# Patient Record
Sex: Male | Born: 1960 | Race: Black or African American | Hispanic: No | Smoking: Current every day smoker
Health system: Southern US, Community
[De-identification: ages and names within clinical notes are randomized; demographics above are authoritative.]

## PROBLEM LIST (undated history)

## (undated) DIAGNOSIS — E1110 Type 2 diabetes mellitus with ketoacidosis without coma: Secondary | ICD-10-CM

## (undated) DIAGNOSIS — Z9889 Other specified postprocedural states: Secondary | ICD-10-CM

## (undated) DIAGNOSIS — T8859XA Other complications of anesthesia, initial encounter: Secondary | ICD-10-CM

## (undated) DIAGNOSIS — E119 Type 2 diabetes mellitus without complications: Secondary | ICD-10-CM

## (undated) DIAGNOSIS — S68119A Complete traumatic metacarpophalangeal amputation of unspecified finger, initial encounter: Secondary | ICD-10-CM

## (undated) DIAGNOSIS — T4145XA Adverse effect of unspecified anesthetic, initial encounter: Secondary | ICD-10-CM

## (undated) DIAGNOSIS — R112 Nausea with vomiting, unspecified: Secondary | ICD-10-CM

---
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
## 1997-06-17 ENCOUNTER — Emergency Department (HOSPITAL_COMMUNITY): Admission: EM | Admit: 1997-06-17 | Discharge: 1997-06-17 | Payer: Self-pay | Admitting: Emergency Medicine

## 1998-10-12 ENCOUNTER — Emergency Department (HOSPITAL_COMMUNITY): Admission: EM | Admit: 1998-10-12 | Discharge: 1998-10-12 | Payer: Self-pay | Admitting: Internal Medicine

## 1998-11-21 ENCOUNTER — Emergency Department (HOSPITAL_COMMUNITY): Admission: EM | Admit: 1998-11-21 | Discharge: 1998-11-21 | Payer: Self-pay | Admitting: Emergency Medicine

## 1999-10-21 ENCOUNTER — Emergency Department (HOSPITAL_COMMUNITY): Admission: EM | Admit: 1999-10-21 | Discharge: 1999-10-21 | Payer: Self-pay | Admitting: Emergency Medicine

## 2002-02-06 ENCOUNTER — Emergency Department (HOSPITAL_COMMUNITY): Admission: EM | Admit: 2002-02-06 | Discharge: 2002-02-06 | Payer: Self-pay | Admitting: Emergency Medicine

## 2002-10-28 ENCOUNTER — Emergency Department (HOSPITAL_COMMUNITY): Admission: EM | Admit: 2002-10-28 | Discharge: 2002-10-29 | Payer: Self-pay | Admitting: Emergency Medicine

## 2003-05-22 ENCOUNTER — Emergency Department (HOSPITAL_COMMUNITY): Admission: EM | Admit: 2003-05-22 | Discharge: 2003-05-22 | Payer: Self-pay | Admitting: Emergency Medicine

## 2003-06-01 ENCOUNTER — Emergency Department (HOSPITAL_COMMUNITY): Admission: EM | Admit: 2003-06-01 | Discharge: 2003-06-01 | Payer: Self-pay | Admitting: Emergency Medicine

## 2003-10-28 ENCOUNTER — Emergency Department (HOSPITAL_COMMUNITY): Admission: EM | Admit: 2003-10-28 | Discharge: 2003-10-28 | Payer: Self-pay | Admitting: Emergency Medicine

## 2003-11-13 ENCOUNTER — Ambulatory Visit: Payer: Self-pay | Admitting: Internal Medicine

## 2003-12-04 ENCOUNTER — Emergency Department (HOSPITAL_COMMUNITY): Admission: EM | Admit: 2003-12-04 | Discharge: 2003-12-04 | Payer: Self-pay

## 2003-12-13 ENCOUNTER — Emergency Department (HOSPITAL_COMMUNITY): Admission: EM | Admit: 2003-12-13 | Discharge: 2003-12-13 | Payer: Self-pay | Admitting: Emergency Medicine

## 2004-03-17 ENCOUNTER — Emergency Department (HOSPITAL_COMMUNITY): Admission: EM | Admit: 2004-03-17 | Discharge: 2004-03-17 | Payer: Self-pay | Admitting: Emergency Medicine

## 2004-04-24 ENCOUNTER — Emergency Department (HOSPITAL_COMMUNITY): Admission: EM | Admit: 2004-04-24 | Discharge: 2004-04-25 | Payer: Self-pay | Admitting: Emergency Medicine

## 2004-08-14 ENCOUNTER — Emergency Department (HOSPITAL_COMMUNITY): Admission: EM | Admit: 2004-08-14 | Discharge: 2004-08-14 | Payer: Self-pay | Admitting: Emergency Medicine

## 2004-09-17 ENCOUNTER — Ambulatory Visit: Payer: Self-pay | Admitting: Internal Medicine

## 2004-09-23 ENCOUNTER — Ambulatory Visit: Payer: Self-pay | Admitting: Internal Medicine

## 2004-11-01 ENCOUNTER — Emergency Department (HOSPITAL_COMMUNITY): Admission: EM | Admit: 2004-11-01 | Discharge: 2004-11-01 | Payer: Self-pay | Admitting: Emergency Medicine

## 2005-01-07 ENCOUNTER — Emergency Department (HOSPITAL_COMMUNITY): Admission: EM | Admit: 2005-01-07 | Discharge: 2005-01-08 | Payer: Self-pay | Admitting: Emergency Medicine

## 2005-04-11 ENCOUNTER — Emergency Department (HOSPITAL_COMMUNITY): Admission: EM | Admit: 2005-04-11 | Discharge: 2005-04-11 | Payer: Self-pay | Admitting: Family Medicine

## 2005-10-29 ENCOUNTER — Emergency Department (HOSPITAL_COMMUNITY): Admission: EM | Admit: 2005-10-29 | Discharge: 2005-10-29 | Payer: Self-pay | Admitting: Emergency Medicine

## 2005-11-30 DIAGNOSIS — E119 Type 2 diabetes mellitus without complications: Secondary | ICD-10-CM | POA: Insufficient documentation

## 2005-11-30 DIAGNOSIS — F411 Generalized anxiety disorder: Secondary | ICD-10-CM | POA: Insufficient documentation

## 2006-01-19 ENCOUNTER — Encounter (INDEPENDENT_AMBULATORY_CARE_PROVIDER_SITE_OTHER): Payer: Self-pay | Admitting: Internal Medicine

## 2006-01-19 ENCOUNTER — Ambulatory Visit: Payer: Self-pay | Admitting: Internal Medicine

## 2006-01-19 LAB — CONVERTED CEMR LAB
Creatinine, Urine: 188.9 mg/dL
Microalb Creat Ratio: 3.8 mg/g (ref 0.0–30.0)
Microalb, Ur: 0.72 mg/dL (ref 0.00–1.89)

## 2006-01-23 ENCOUNTER — Encounter (INDEPENDENT_AMBULATORY_CARE_PROVIDER_SITE_OTHER): Payer: Self-pay | Admitting: Internal Medicine

## 2006-01-23 ENCOUNTER — Ambulatory Visit: Payer: Self-pay | Admitting: Hospitalist

## 2006-01-23 LAB — CONVERTED CEMR LAB
Albumin: 4.4 g/dL (ref 3.5–5.2)
CO2: 27 meq/L (ref 19–32)
Calcium: 9.5 mg/dL (ref 8.4–10.5)
Cholesterol: 233 mg/dL — ABNORMAL HIGH (ref 0–200)
Potassium: 4.7 meq/L (ref 3.5–5.3)
Total Bilirubin: 0.3 mg/dL (ref 0.3–1.2)
Total CHOL/HDL Ratio: 6.7
VLDL: 45 mg/dL — ABNORMAL HIGH (ref 0–40)

## 2006-05-09 ENCOUNTER — Telehealth: Payer: Self-pay | Admitting: *Deleted

## 2006-07-17 ENCOUNTER — Telehealth (INDEPENDENT_AMBULATORY_CARE_PROVIDER_SITE_OTHER): Payer: Self-pay | Admitting: *Deleted

## 2006-07-19 ENCOUNTER — Encounter (INDEPENDENT_AMBULATORY_CARE_PROVIDER_SITE_OTHER): Payer: Self-pay | Admitting: *Deleted

## 2006-07-19 ENCOUNTER — Ambulatory Visit: Payer: Self-pay | Admitting: Hospitalist

## 2006-07-19 DIAGNOSIS — F489 Nonpsychotic mental disorder, unspecified: Secondary | ICD-10-CM | POA: Insufficient documentation

## 2006-07-19 DIAGNOSIS — F17200 Nicotine dependence, unspecified, uncomplicated: Secondary | ICD-10-CM | POA: Insufficient documentation

## 2006-07-19 LAB — CONVERTED CEMR LAB
ALT: 30 units/L (ref 0–53)
AST: 18 units/L (ref 0–37)
Alkaline Phosphatase: 90 units/L (ref 39–117)
BUN: 19 mg/dL (ref 6–23)
Basophils Absolute: 0 10*3/uL (ref 0.0–0.1)
Blood Glucose, Fingerstick: 214
CO2: 27 meq/L (ref 19–32)
Chlamydia, Swab/Urine, PCR: NEGATIVE
Creatinine, Ser: 0.9 mg/dL (ref 0.40–1.50)
Eosinophils Absolute: 0.1 10*3/uL (ref 0.0–0.7)
GC Probe Amp, Urine: NEGATIVE
HCT: 46.9 % (ref 39.0–52.0)
Hgb A1c MFr Bld: 6.8 %
Lymphocytes Relative: 35 % (ref 12–46)
Lymphs Abs: 3.4 10*3/uL — ABNORMAL HIGH (ref 0.7–3.3)
Monocytes Relative: 9 % (ref 3–11)
Potassium: 4.7 meq/L (ref 3.5–5.3)
RDW: 14.6 % — ABNORMAL HIGH (ref 11.5–14.0)
Total Bilirubin: 0.5 mg/dL (ref 0.3–1.2)

## 2006-11-30 ENCOUNTER — Emergency Department (HOSPITAL_COMMUNITY): Admission: EM | Admit: 2006-11-30 | Discharge: 2006-11-30 | Payer: Self-pay | Admitting: Emergency Medicine

## 2007-11-20 ENCOUNTER — Emergency Department (HOSPITAL_COMMUNITY): Admission: EM | Admit: 2007-11-20 | Discharge: 2007-11-20 | Payer: Self-pay | Admitting: Emergency Medicine

## 2010-10-12 LAB — COMPREHENSIVE METABOLIC PANEL
ALT: 25
AST: 28
Albumin: 3.9
BUN: 21
CO2: 28
Calcium: 9.2
Chloride: 106
Creatinine, Ser: 0.85
Sodium: 140
Total Bilirubin: 0.7
Total Protein: 7

## 2010-10-12 LAB — RAPID URINE DRUG SCREEN, HOSP PERFORMED
Amphetamines: NOT DETECTED
Benzodiazepines: NOT DETECTED
Cocaine: NOT DETECTED

## 2010-10-12 LAB — URINALYSIS, ROUTINE W REFLEX MICROSCOPIC
Bilirubin Urine: NEGATIVE
Glucose, UA: 100 — AB
Hgb urine dipstick: NEGATIVE
Protein, ur: NEGATIVE
Specific Gravity, Urine: 1.029
Urobilinogen, UA: 1
pH: 7

## 2010-10-12 LAB — DIFFERENTIAL
Eosinophils Absolute: 0.1
Eosinophils Relative: 1
Lymphocytes Relative: 29
Lymphs Abs: 3
Monocytes Relative: 11
Neutro Abs: 6

## 2010-10-12 LAB — ETHANOL: Alcohol, Ethyl (B): <5

## 2010-10-12 LAB — CBC
Hemoglobin: 13.9
MCV: 86.2
WBC: 10.4

## 2013-10-25 ENCOUNTER — Inpatient Hospital Stay (HOSPITAL_COMMUNITY)
Admission: EM | Admit: 2013-10-25 | Discharge: 2013-11-02 | DRG: 854 | Disposition: A | Discharge status: Home Health | Payer: Self-pay | Attending: Internal Medicine | Admitting: Internal Medicine

## 2013-10-25 ENCOUNTER — Encounter (HOSPITAL_COMMUNITY): Payer: Self-pay | Admitting: Emergency Medicine

## 2013-10-25 ENCOUNTER — Emergency Department (HOSPITAL_COMMUNITY): Payer: Self-pay

## 2013-10-25 DIAGNOSIS — A419 Sepsis, unspecified organism: Principal | ICD-10-CM

## 2013-10-25 DIAGNOSIS — M869 Osteomyelitis, unspecified: Secondary | ICD-10-CM

## 2013-10-25 DIAGNOSIS — L02512 Cutaneous abscess of left hand: Secondary | ICD-10-CM | POA: Diagnosis present

## 2013-10-25 DIAGNOSIS — E871 Hypo-osmolality and hyponatremia: Secondary | ICD-10-CM

## 2013-10-25 DIAGNOSIS — E870 Hyperosmolality and hypernatremia: Secondary | ICD-10-CM | POA: Diagnosis present

## 2013-10-25 DIAGNOSIS — Z9119 Patient's noncompliance with other medical treatment and regimen: Secondary | ICD-10-CM | POA: Diagnosis present

## 2013-10-25 DIAGNOSIS — R700 Elevated erythrocyte sedimentation rate: Secondary | ICD-10-CM

## 2013-10-25 DIAGNOSIS — I10 Essential (primary) hypertension: Secondary | ICD-10-CM

## 2013-10-25 DIAGNOSIS — F17210 Nicotine dependence, cigarettes, uncomplicated: Secondary | ICD-10-CM | POA: Diagnosis present

## 2013-10-25 DIAGNOSIS — M868X4 Other osteomyelitis, hand: Secondary | ICD-10-CM | POA: Diagnosis present

## 2013-10-25 DIAGNOSIS — N179 Acute kidney failure, unspecified: Secondary | ICD-10-CM

## 2013-10-25 DIAGNOSIS — G629 Polyneuropathy, unspecified: Secondary | ICD-10-CM | POA: Diagnosis present

## 2013-10-25 DIAGNOSIS — IMO0002 Reserved for concepts with insufficient information to code with codable children: Secondary | ICD-10-CM

## 2013-10-25 DIAGNOSIS — L03012 Cellulitis of left finger: Secondary | ICD-10-CM | POA: Diagnosis present

## 2013-10-25 DIAGNOSIS — IMO0001 Reserved for inherently not codable concepts without codable children: Secondary | ICD-10-CM

## 2013-10-25 DIAGNOSIS — Z794 Long term (current) use of insulin: Secondary | ICD-10-CM

## 2013-10-25 DIAGNOSIS — Z91120 Patient's intentional underdosing of medication regimen due to financial hardship: Secondary | ICD-10-CM | POA: Diagnosis present

## 2013-10-25 DIAGNOSIS — N183 Chronic kidney disease, stage 3 (moderate): Secondary | ICD-10-CM | POA: Diagnosis present

## 2013-10-25 DIAGNOSIS — I129 Hypertensive chronic kidney disease with stage 1 through stage 4 chronic kidney disease, or unspecified chronic kidney disease: Secondary | ICD-10-CM | POA: Diagnosis present

## 2013-10-25 DIAGNOSIS — E1065 Type 1 diabetes mellitus with hyperglycemia: Secondary | ICD-10-CM | POA: Diagnosis present

## 2013-10-25 DIAGNOSIS — E119 Type 2 diabetes mellitus without complications: Secondary | ICD-10-CM

## 2013-10-25 DIAGNOSIS — D638 Anemia in other chronic diseases classified elsewhere: Secondary | ICD-10-CM

## 2013-10-25 DIAGNOSIS — G5631 Lesion of radial nerve, right upper limb: Secondary | ICD-10-CM | POA: Diagnosis present

## 2013-10-25 DIAGNOSIS — F17220 Nicotine dependence, chewing tobacco, uncomplicated: Secondary | ICD-10-CM | POA: Diagnosis present

## 2013-10-25 DIAGNOSIS — E108 Type 1 diabetes mellitus with unspecified complications: Secondary | ICD-10-CM

## 2013-10-25 DIAGNOSIS — T383X6A Underdosing of insulin and oral hypoglycemic [antidiabetic] drugs, initial encounter: Secondary | ICD-10-CM | POA: Diagnosis present

## 2013-10-25 HISTORY — DX: Type 2 diabetes mellitus without complications: E11.9

## 2013-10-25 LAB — COMPREHENSIVE METABOLIC PANEL
ALK PHOS: 132 U/L — AB (ref 39–117)
ALT: 17 U/L (ref 0–53)
ANION GAP: 17 — AB (ref 5–15)
AST: 15 U/L (ref 0–37)
Albumin: 3.2 g/dL — ABNORMAL LOW (ref 3.5–5.2)
BUN: 42 mg/dL — ABNORMAL HIGH (ref 6–23)
CO2: 19 meq/L (ref 19–32)
Calcium: 10.1 mg/dL (ref 8.4–10.5)
Chloride: 93 mEq/L — ABNORMAL LOW (ref 96–112)
Creatinine, Ser: 2.42 mg/dL — ABNORMAL HIGH (ref 0.50–1.35)
GFR calc Af Amer: 34 mL/min — ABNORMAL LOW (ref >90)
GFR, EST NON AFRICAN AMERICAN: 29 mL/min — AB (ref >90)
GLUCOSE: 588 mg/dL — AB (ref 70–99)
Potassium: 5.2 mEq/L (ref 3.7–5.3)
Sodium: 129 mEq/L — ABNORMAL LOW (ref 137–147)
TOTAL PROTEIN: 9.3 g/dL — AB (ref 6.0–8.3)
Total Bilirubin: 0.3 mg/dL (ref 0.3–1.2)

## 2013-10-25 LAB — CBG MONITORING, ED: GLUCOSE-CAPILLARY: 529 mg/dL — AB (ref 70–99)

## 2013-10-25 LAB — DIFFERENTIAL
BASOS ABS: 0 10*3/uL (ref 0.0–0.1)
BASOS PCT: 0 % (ref 0–1)
EOS PCT: 0 % (ref 0–5)
Eosinophils Absolute: 0 10*3/uL (ref 0.0–0.7)
LYMPHS PCT: 15 % (ref 12–46)
Lymphs Abs: 2.3 10*3/uL (ref 0.7–4.0)
MONOS PCT: 8 % (ref 3–12)
Monocytes Absolute: 1.2 10*3/uL — ABNORMAL HIGH (ref 0.1–1.0)
NEUTROS ABS: 11.7 10*3/uL — AB (ref 1.7–7.7)
NEUTROS PCT: 77 % (ref 43–77)

## 2013-10-25 LAB — ETHANOL

## 2013-10-25 LAB — I-STAT CG4 LACTIC ACID, ED: Lactic Acid, Venous: 1.36 mmol/L (ref 0.5–2.2)

## 2013-10-25 LAB — PROTIME-INR
INR: 1.11 (ref 0.00–1.49)
PROTHROMBIN TIME: 14.5 s (ref 11.6–15.2)

## 2013-10-25 LAB — I-STAT VENOUS BLOOD GAS, ED
Acid-base deficit: 1 mmol/L (ref 0.0–2.0)
BICARBONATE: 23.8 meq/L (ref 20.0–24.0)
O2 Saturation: 97 %
PCO2 VEN: 37.7 mmHg — AB (ref 45.0–50.0)
PH VEN: 7.407 — AB (ref 7.250–7.300)
Patient temperature: 98.1
TCO2: 25 mmol/L (ref 0–100)
pO2, Ven: 86 mmHg — ABNORMAL HIGH (ref 30.0–45.0)

## 2013-10-25 LAB — CBC
HEMATOCRIT: 32.7 % — AB (ref 39.0–52.0)
Hemoglobin: 11.2 g/dL — ABNORMAL LOW (ref 13.0–17.0)
MCH: 28.1 pg (ref 26.0–34.0)
MCHC: 34.3 g/dL (ref 30.0–36.0)
MCV: 82 fL (ref 78.0–100.0)
PLATELETS: 375 10*3/uL (ref 150–400)
RBC: 3.99 MIL/uL — AB (ref 4.22–5.81)
RDW: 13 % (ref 11.5–15.5)
WBC: 15.2 10*3/uL — ABNORMAL HIGH (ref 4.0–10.5)

## 2013-10-25 LAB — APTT: APTT: 33 s (ref 24–37)

## 2013-10-25 MED ORDER — SODIUM CHLORIDE 0.9 % IV BOLUS (SEPSIS)
1000.0000 mL | Freq: Once | INTRAVENOUS | Status: AC
  Administered 2013-10-25: 1000 mL via INTRAVENOUS

## 2013-10-25 MED ORDER — INSULIN ASPART 100 UNIT/ML ~~LOC~~ SOLN
20.0000 [IU] | Freq: Once | SUBCUTANEOUS | Status: AC
  Administered 2013-10-25: 20 [IU] via SUBCUTANEOUS
  Filled 2013-10-25: qty 1

## 2013-10-25 NOTE — ED Notes (Signed)
Jarrett Soho, PA-C, at the bedside.
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
## 2013-10-25 NOTE — ED Notes (Signed)
Critical Glucose lab of 588 given to [place], [place].
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
## 2013-10-26 ENCOUNTER — Encounter (HOSPITAL_COMMUNITY): Payer: Self-pay

## 2013-10-26 ENCOUNTER — Inpatient Hospital Stay (HOSPITAL_COMMUNITY): Payer: Self-pay | Admitting: Anesthesiology

## 2013-10-26 ENCOUNTER — Encounter (HOSPITAL_COMMUNITY)
Admission: EM | Disposition: A | Discharge status: Home Health | Payer: Self-pay | Source: Home / Self Care | Attending: Internal Medicine

## 2013-10-26 ENCOUNTER — Encounter (HOSPITAL_COMMUNITY): Payer: MEDICAID | Admitting: Anesthesiology

## 2013-10-26 ENCOUNTER — Emergency Department (HOSPITAL_COMMUNITY): Payer: Self-pay

## 2013-10-26 DIAGNOSIS — IMO0002 Reserved for concepts with insufficient information to code with codable children: Secondary | ICD-10-CM | POA: Diagnosis present

## 2013-10-26 DIAGNOSIS — I10 Essential (primary) hypertension: Secondary | ICD-10-CM | POA: Diagnosis present

## 2013-10-26 DIAGNOSIS — M869 Osteomyelitis, unspecified: Secondary | ICD-10-CM | POA: Diagnosis present

## 2013-10-26 DIAGNOSIS — E1065 Type 1 diabetes mellitus with hyperglycemia: Secondary | ICD-10-CM | POA: Diagnosis present

## 2013-10-26 DIAGNOSIS — N179 Acute kidney failure, unspecified: Secondary | ICD-10-CM | POA: Diagnosis present

## 2013-10-26 DIAGNOSIS — A419 Sepsis, unspecified organism: Secondary | ICD-10-CM | POA: Diagnosis present

## 2013-10-26 DIAGNOSIS — D638 Anemia in other chronic diseases classified elsewhere: Secondary | ICD-10-CM | POA: Diagnosis present

## 2013-10-26 DIAGNOSIS — E871 Hypo-osmolality and hyponatremia: Secondary | ICD-10-CM | POA: Diagnosis present

## 2013-10-26 DIAGNOSIS — E108 Type 1 diabetes mellitus with unspecified complications: Secondary | ICD-10-CM

## 2013-10-26 HISTORY — PX: I&D EXTREMITY: SHX5045

## 2013-10-26 LAB — LIPID PANEL
CHOL/HDL RATIO: 4.7 ratio
Cholesterol: 180 mg/dL (ref 0–200)
HDL: 38 mg/dL — ABNORMAL LOW (ref >39)
LDL Cholesterol: 112 mg/dL — ABNORMAL HIGH (ref 0–99)
Triglycerides: 148 mg/dL (ref <150)
VLDL: 30 mg/dL (ref 0–40)

## 2013-10-26 LAB — GLUCOSE, CAPILLARY
GLUCOSE-CAPILLARY: 251 mg/dL — AB (ref 70–99)
Glucose-Capillary: 189 mg/dL — ABNORMAL HIGH (ref 70–99)
Glucose-Capillary: 247 mg/dL — ABNORMAL HIGH (ref 70–99)
Glucose-Capillary: 290 mg/dL — ABNORMAL HIGH (ref 70–99)
Glucose-Capillary: 466 mg/dL — ABNORMAL HIGH (ref 70–99)

## 2013-10-26 LAB — BASIC METABOLIC PANEL
Anion gap: 15 (ref 5–15)
BUN: 35 mg/dL — ABNORMAL HIGH (ref 6–23)
CALCIUM: 9.6 mg/dL (ref 8.4–10.5)
CO2: 20 mEq/L (ref 19–32)
Chloride: 107 mEq/L (ref 96–112)
Creatinine, Ser: 2.1 mg/dL — ABNORMAL HIGH (ref 0.50–1.35)
GFR calc Af Amer: 40 mL/min — ABNORMAL LOW (ref >90)
GFR, EST NON AFRICAN AMERICAN: 35 mL/min — AB (ref >90)
Glucose, Bld: 174 mg/dL — ABNORMAL HIGH (ref 70–99)
Potassium: 4.2 mEq/L (ref 3.7–5.3)
SODIUM: 142 meq/L (ref 137–147)

## 2013-10-26 LAB — SEDIMENTATION RATE: Sed Rate: 125 mm/hr — ABNORMAL HIGH (ref 0–16)

## 2013-10-26 LAB — RAPID URINE DRUG SCREEN, HOSP PERFORMED
Amphetamines: NOT DETECTED
Barbiturates: NOT DETECTED
Benzodiazepines: NOT DETECTED
Cocaine: NOT DETECTED
OPIATES: NOT DETECTED
Tetrahydrocannabinol: NOT DETECTED

## 2013-10-26 LAB — URINALYSIS, ROUTINE W REFLEX MICROSCOPIC
Bilirubin Urine: NEGATIVE
KETONES UR: NEGATIVE mg/dL
Leukocytes, UA: NEGATIVE
Nitrite: NEGATIVE
PH: 5.5 (ref 5.0–8.0)
PROTEIN: NEGATIVE mg/dL
Specific Gravity, Urine: 1.016 (ref 1.005–1.030)
UROBILINOGEN UA: 0.2 mg/dL (ref 0.0–1.0)

## 2013-10-26 LAB — CBC
HCT: 30 % — ABNORMAL LOW (ref 39.0–52.0)
Hemoglobin: 10.3 g/dL — ABNORMAL LOW (ref 13.0–17.0)
MCH: 28 pg (ref 26.0–34.0)
MCHC: 34.3 g/dL (ref 30.0–36.0)
MCV: 81.5 fL (ref 78.0–100.0)
PLATELETS: 368 10*3/uL (ref 150–400)
RBC: 3.68 MIL/uL — ABNORMAL LOW (ref 4.22–5.81)
RDW: 13 % (ref 11.5–15.5)
WBC: 14.8 10*3/uL — ABNORMAL HIGH (ref 4.0–10.5)

## 2013-10-26 LAB — SURGICAL PCR SCREEN
MRSA, PCR: NEGATIVE
Staphylococcus aureus: NEGATIVE

## 2013-10-26 LAB — URINE MICROSCOPIC-ADD ON

## 2013-10-26 LAB — TSH: TSH: 0.344 u[IU]/mL — ABNORMAL LOW (ref 0.350–4.500)

## 2013-10-26 SURGERY — IRRIGATION AND DEBRIDEMENT EXTREMITY
Anesthesia: General | Laterality: Left

## 2013-10-26 MED ORDER — ONDANSETRON HCL 4 MG/2ML IJ SOLN
INTRAMUSCULAR | Status: DC | PRN
  Administered 2013-10-26: 4 mg via INTRAVENOUS

## 2013-10-26 MED ORDER — SODIUM CHLORIDE 0.9 % IJ SOLN
INTRAMUSCULAR | Status: AC
  Filled 2013-10-26: qty 10

## 2013-10-26 MED ORDER — VANCOMYCIN HCL IN DEXTROSE 1-5 GM/200ML-% IV SOLN
1000.0000 mg | Freq: Once | INTRAVENOUS | Status: AC
  Administered 2013-10-26: 1000 mg via INTRAVENOUS
  Filled 2013-10-26: qty 200

## 2013-10-26 MED ORDER — LIDOCAINE HCL (CARDIAC) 20 MG/ML IV SOLN
INTRAVENOUS | Status: DC | PRN
  Administered 2013-10-26: 100 mg via INTRAVENOUS

## 2013-10-26 MED ORDER — SODIUM CHLORIDE 0.9 % IV SOLN
INTRAVENOUS | Status: DC
  Administered 2013-10-26 (×2): via INTRAVENOUS

## 2013-10-26 MED ORDER — HYDROCODONE-ACETAMINOPHEN 5-325 MG PO TABS
1.0000 | ORAL_TABLET | ORAL | Status: DC | PRN
  Administered 2013-10-26: 2 via ORAL

## 2013-10-26 MED ORDER — PIPERACILLIN-TAZOBACTAM 3.375 G IVPB
3.3750 g | Freq: Three times a day (TID) | INTRAVENOUS | Status: DC
  Administered 2013-10-26 – 2013-10-28 (×7): 3.375 g via INTRAVENOUS
  Filled 2013-10-26 (×9): qty 50

## 2013-10-26 MED ORDER — FENTANYL CITRATE 0.05 MG/ML IJ SOLN
INTRAMUSCULAR | Status: AC
Start: 2013-10-26 — End: 2013-10-26
  Filled 2013-10-26: qty 5

## 2013-10-26 MED ORDER — DEXTROSE 5 % IV SOLN
INTRAVENOUS | Status: DC | PRN
  Administered 2013-10-26: 16:00:00 via INTRAVENOUS

## 2013-10-26 MED ORDER — INSULIN GLARGINE 100 UNIT/ML ~~LOC~~ SOLN
10.0000 [IU] | Freq: Every day | SUBCUTANEOUS | Status: DC
  Administered 2013-10-26 (×2): 10 [IU] via SUBCUTANEOUS
  Filled 2013-10-26 (×4): qty 0.1

## 2013-10-26 MED ORDER — FENTANYL CITRATE 0.05 MG/ML IJ SOLN
INTRAMUSCULAR | Status: DC | PRN
  Administered 2013-10-26: 100 ug via INTRAVENOUS
  Administered 2013-10-26: 50 ug via INTRAVENOUS
  Administered 2013-10-26: 100 ug via INTRAVENOUS

## 2013-10-26 MED ORDER — SODIUM CHLORIDE 0.9 % IV SOLN
INTRAVENOUS | Status: DC
Start: 2013-10-26 — End: 2013-10-26
  Administered 2013-10-26: 02:00:00 via INTRAVENOUS

## 2013-10-26 MED ORDER — SODIUM CHLORIDE 0.9 % IR SOLN
Status: DC | PRN
  Administered 2013-10-26: 1000 mL

## 2013-10-26 MED ORDER — DEXAMETHASONE SODIUM PHOSPHATE 4 MG/ML IJ SOLN
INTRAMUSCULAR | Status: DC | PRN
  Administered 2013-10-26: 8 mg via INTRAVENOUS

## 2013-10-26 MED ORDER — INSULIN ASPART 100 UNIT/ML ~~LOC~~ SOLN
0.0000 [IU] | Freq: Every day | SUBCUTANEOUS | Status: DC
  Administered 2013-10-27: 4 [IU] via SUBCUTANEOUS
  Administered 2013-10-28: 3 [IU] via SUBCUTANEOUS

## 2013-10-26 MED ORDER — EPHEDRINE SULFATE 50 MG/ML IJ SOLN
INTRAMUSCULAR | Status: AC
  Filled 2013-10-26: qty 1

## 2013-10-26 MED ORDER — ONDANSETRON HCL 4 MG PO TABS: 4.0000 mg | ORAL_TABLET | Freq: Four times a day (QID) | ORAL | Status: DC | PRN

## 2013-10-26 MED ORDER — LIDOCAINE HCL (CARDIAC) 20 MG/ML IV SOLN
INTRAVENOUS | Status: AC
  Filled 2013-10-26: qty 10

## 2013-10-26 MED ORDER — PROPOFOL 10 MG/ML IV BOLUS
INTRAVENOUS | Status: DC | PRN
  Administered 2013-10-26: 150 mg via INTRAVENOUS

## 2013-10-26 MED ORDER — HYDRALAZINE HCL 20 MG/ML IJ SOLN
5.0000 mg | Freq: Four times a day (QID) | INTRAMUSCULAR | Status: DC | PRN
  Administered 2013-10-28: 5 mg via INTRAVENOUS
  Filled 2013-10-26: qty 1

## 2013-10-26 MED ORDER — HYDROCODONE-ACETAMINOPHEN 5-325 MG PO TABS
ORAL_TABLET | ORAL | Status: AC
  Administered 2013-10-26: 2 via ORAL
  Filled 2013-10-26: qty 2

## 2013-10-26 MED ORDER — HYDROMORPHONE HCL 1 MG/ML IJ SOLN
0.2500 mg | INTRAMUSCULAR | Status: DC | PRN
  Administered 2013-10-26 (×4): 0.5 mg via INTRAVENOUS

## 2013-10-26 MED ORDER — ENOXAPARIN SODIUM 40 MG/0.4ML ~~LOC~~ SOLN
40.0000 mg | Freq: Every day | SUBCUTANEOUS | Status: DC
  Administered 2013-10-27 – 2013-11-02 (×6): 40 mg via SUBCUTANEOUS
  Filled 2013-10-26 (×8): qty 0.4

## 2013-10-26 MED ORDER — INSULIN ASPART 100 UNIT/ML ~~LOC~~ SOLN
0.0000 [IU] | Freq: Three times a day (TID) | SUBCUTANEOUS | Status: DC
  Administered 2013-10-26: 3 [IU] via SUBCUTANEOUS
  Administered 2013-10-26: 1 [IU] via SUBCUTANEOUS
  Administered 2013-10-27 – 2013-10-28 (×4): 9 [IU] via SUBCUTANEOUS
  Administered 2013-10-28: 7 [IU] via SUBCUTANEOUS
  Administered 2013-10-28: 5 [IU] via SUBCUTANEOUS
  Administered 2013-10-29: 1 [IU] via SUBCUTANEOUS

## 2013-10-26 MED ORDER — HYDROMORPHONE HCL 1 MG/ML IJ SOLN
INTRAMUSCULAR | Status: AC
  Administered 2013-10-26: 0.5 mg via INTRAVENOUS
  Filled 2013-10-26: qty 1

## 2013-10-26 MED ORDER — BUPIVACAINE HCL (PF) 0.25 % IJ SOLN
INTRAMUSCULAR | Status: AC
  Filled 2013-10-26: qty 30

## 2013-10-26 MED ORDER — HYDROMORPHONE HCL 1 MG/ML IJ SOLN: 1.0000 mg | INTRAMUSCULAR | Status: DC | PRN

## 2013-10-26 MED ORDER — VANCOMYCIN HCL IN DEXTROSE 750-5 MG/150ML-% IV SOLN
750.0000 mg | Freq: Two times a day (BID) | INTRAVENOUS | Status: DC
  Administered 2013-10-26 – 2013-10-28 (×4): 750 mg via INTRAVENOUS
  Filled 2013-10-26 (×5): qty 150

## 2013-10-26 MED ORDER — INSULIN ASPART 100 UNIT/ML ~~LOC~~ SOLN
8.0000 [IU] | Freq: Once | SUBCUTANEOUS | Status: AC
  Administered 2013-10-26: 8 [IU] via SUBCUTANEOUS

## 2013-10-26 MED ORDER — ONDANSETRON HCL 4 MG/2ML IJ SOLN: 4.0000 mg | Freq: Four times a day (QID) | INTRAMUSCULAR | Status: DC | PRN

## 2013-10-26 MED ORDER — PROPOFOL 10 MG/ML IV BOLUS
INTRAVENOUS | Status: AC
  Filled 2013-10-26: qty 20

## 2013-10-26 MED ORDER — BUPIVACAINE HCL (PF) 0.25 % IJ SOLN
INTRAMUSCULAR | Status: DC | PRN
  Administered 2013-10-26: 10 mL

## 2013-10-26 MED ORDER — ONDANSETRON HCL 4 MG/2ML IJ SOLN: 4.0000 mg | Freq: Once | INTRAMUSCULAR | Status: DC | PRN

## 2013-10-26 MED ORDER — SODIUM CHLORIDE 0.9 % IV SOLN
INTRAVENOUS | Status: DC
  Administered 2013-10-26: 15:00:00 via INTRAVENOUS

## 2013-10-26 MED ORDER — ONDANSETRON HCL 4 MG/2ML IJ SOLN: 4.0000 mg | Freq: Three times a day (TID) | INTRAMUSCULAR | Status: DC | PRN

## 2013-10-26 MED ORDER — SODIUM CHLORIDE 0.9 % IJ SOLN
3.0000 mL | Freq: Two times a day (BID) | INTRAMUSCULAR | Status: DC
  Administered 2013-10-26 – 2013-11-02 (×12): 3 mL via INTRAVENOUS

## 2013-10-26 SURGICAL SUPPLY — 45 items
BAG DECANTER FOR FLEXI CONT (MISCELLANEOUS) ×3 IMPLANT
BANDAGE ELASTIC 3 VELCRO ST LF (GAUZE/BANDAGES/DRESSINGS) IMPLANT
BANDAGE ELASTIC 4 VELCRO ST LF (GAUZE/BANDAGES/DRESSINGS) ×3 IMPLANT
BANDAGE GAUZE 4  KLING STR (GAUZE/BANDAGES/DRESSINGS) ×3 IMPLANT
BNDG ELASTIC 2 VLCR STRL LF (GAUZE/BANDAGES/DRESSINGS) ×3 IMPLANT
BNDG ESMARK 4X9 LF (GAUZE/BANDAGES/DRESSINGS) ×3 IMPLANT
BNDG GAUZE ELAST 4 BULKY (GAUZE/BANDAGES/DRESSINGS) ×3 IMPLANT
CHLORAPREP W/TINT 26ML (MISCELLANEOUS) ×3 IMPLANT
CORDS BIPOLAR (ELECTRODE) ×3 IMPLANT
CUFF TOURNIQUET SINGLE 18IN (TOURNIQUET CUFF) IMPLANT
DRAPE SURG 17X23 STRL (DRAPES) ×3 IMPLANT
DRSG ADAPTIC 3X8 NADH LF (GAUZE/BANDAGES/DRESSINGS) ×3 IMPLANT
ELECT REM PT RETURN 9FT ADLT (ELECTROSURGICAL)
ELECTRODE REM PT RTRN 9FT ADLT (ELECTROSURGICAL) IMPLANT
GAUZE PACKING IODOFORM 1/4X15 (GAUZE/BANDAGES/DRESSINGS) ×3 IMPLANT
GAUZE PACKING IODOFORM 1/4X5 (PACKING) IMPLANT
GAUZE SPONGE 4X4 12PLY STRL (GAUZE/BANDAGES/DRESSINGS) ×3 IMPLANT
GAUZE XEROFORM 1X8 LF (GAUZE/BANDAGES/DRESSINGS) ×3 IMPLANT
GLOVE BIOGEL M STRL SZ7.5 (GLOVE) ×3 IMPLANT
GOWN STRL REUS W/ TWL LRG LVL3 (GOWN DISPOSABLE) ×2 IMPLANT
GOWN STRL REUS W/ TWL XL LVL3 (GOWN DISPOSABLE) ×1 IMPLANT
GOWN STRL REUS W/TWL LRG LVL3 (GOWN DISPOSABLE) ×4
GOWN STRL REUS W/TWL XL LVL3 (GOWN DISPOSABLE) ×2
HANDPIECE INTERPULSE COAX TIP (DISPOSABLE)
KIT BASIN OR (CUSTOM PROCEDURE TRAY) ×3 IMPLANT
KIT ROOM TURNOVER OR (KITS) ×3 IMPLANT
MANIFOLD NEPTUNE II (INSTRUMENTS) ×3 IMPLANT
NEEDLE HYPO 25GX1X1/2 BEV (NEEDLE) IMPLANT
NS IRRIG 1000ML POUR BTL (IV SOLUTION) ×3 IMPLANT
PACK ORTHO EXTREMITY (CUSTOM PROCEDURE TRAY) ×3 IMPLANT
PAD ARMBOARD 7.5X6 YLW CONV (MISCELLANEOUS) ×6 IMPLANT
PAD CAST 4YDX4 CTTN HI CHSV (CAST SUPPLIES) ×1 IMPLANT
PADDING CAST COTTON 4X4 STRL (CAST SUPPLIES) ×2
SET HNDPC FAN SPRY TIP SCT (DISPOSABLE) IMPLANT
SOAP 2 % CHG 4 OZ (WOUND CARE) ×3 IMPLANT
SPONGE LAP 18X18 X RAY DECT (DISPOSABLE) ×3 IMPLANT
SPONGE LAP 4X18 X RAY DECT (DISPOSABLE) ×3 IMPLANT
SYR CONTROL 10ML LL (SYRINGE) IMPLANT
TOWEL OR 17X24 6PK STRL BLUE (TOWEL DISPOSABLE) ×3 IMPLANT
TOWEL OR 17X26 10 PK STRL BLUE (TOWEL DISPOSABLE) ×3 IMPLANT
TUBE ANAEROBIC SPECIMEN COL (MISCELLANEOUS) IMPLANT
TUBE CONNECTING 12'X1/4 (SUCTIONS) ×1
TUBE CONNECTING 12X1/4 (SUCTIONS) ×2 IMPLANT
WATER STERILE IRR 1000ML POUR (IV SOLUTION) ×3 IMPLANT
YANKAUER SUCT BULB TIP NO VENT (SUCTIONS) ×3 IMPLANT

## 2013-10-26 NOTE — Progress Notes (Signed)
ANTIBIOTIC CONSULT NOTE - INITIAL  Pharmacy Consult for Vancomycin  Indication: Osteomyelitis  Patient Measurements: Height: 6' (182.9 cm) Weight: 222 lb (100.699 kg) IBW/kg (Calculated) : 77.6  Vital Signs: Temp: 99.1 F (37.3 C) (10/16 2233) Temp Source: Oral (10/16 2233) BP: 148/88 mmHg (10/17 0145) Pulse Rate: 113 (10/17 0145)  Labs:  Recent Labs  10/25/13 2250  WBC 15.2*  HGB 11.2*  PLT 375  CREATININE 2.42*   Estimated Creatinine Clearance: 43.8 ml/min (by C-G formula based on Cr of 2.42).  Medical History: Past Medical History  Diagnosis Date  . Diabetes mellitus without complication     Assessment: 53 y/o M with left index finger osteomyelitis, WBC elevated, noted renal dysfunction.   Goal of Therapy:  Vancomycin trough level 15-20 mcg/ml  Plan:  -Vancomycin 1000 mg IV x 1, then 750 mg IV q12h -Trend WBC, temp, renal function  -Drug levels as indicated   Narda Bonds 10/26/2013,1:56 AM
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
## 2013-10-26 NOTE — Op Note (Signed)
NAME:  Timothy Madden, Timothy Madden             ACCOUNT NO.:  [id number]  MEDICAL RECORD NO.:  [id number]  LOCATION:  [id number]                        FACILITY:  [facility]  PHYSICIAN:  Dennie Bible, MD    DATE OF BIRTH:  [birth year]  DATE OF PROCEDURE:  10/26/2013 DATE OF DISCHARGE:                              OPERATIVE REPORT   PREOPERATIVE DIAGNOSIS:  Severe infection of the left index finger with x-ray evidence of osteomyelitis of the distal phalanx.  POSTOPERATIVE DIAGNOSIS:  Severe infection of the left index finger with x-ray evidence of osteomyelitis of the distal phalanx.  PROCEDURE: 1. Incision and drainage of the left index finger. 2. Drainage of the flexor tendon sheath of the left index finger. 3. Amputation of the left index finger just proximal to the PIP joint. 4. Release of the A1 pulley of the left index finger.  ANESTHESIA:  General.  ESTIMATED BLOOD LOSS:  Minimal.  No acute complications.  Cultures were taken.  INDICATIONS:  [name] is a diabetic male who presented to the emergency room last night with a very swollen painful infected left index finger.  I was consulted.  His blood sugar was out of control and showed signs of sepsis.  He was admitted to the hospital for evaluation this morning and felt he needed an I and D with probable amputation of stump or the entire left index finger.  This was discussed with the patient in detail.  DESCRIPTION OF PROCEDURE:  The patient was taken to the operating room, placed supine on the operating room table.  A time-out was performed. The left upper extremity was prepped and draped in normal sterile fashion after general anesthesia was administered.  The finger was approximately 3 times the size of the opposite index finger.  There was obvious infection mostly on the radial aspect of the digit.  initially with a scalpel radial midlateral incision was made, essentially the entire length of the finger.  Once this performed  under pressure, pus was evacuated. The skin was essentially lifted off the underlying dermis.  The  skin was debrided to reveal a red angry infected digit distally.  From the distal phalanx, the finger was nonviable, it was white in appearance, and there was pus present.  Upon further evaluation, there was a hole in the volar aspect overlying the middle phalanx.  This was probed and again pus was emanating from the flexor tendon sheath.  The flexor tendon sheath was opened and revealed pus even back proximally.  Incision was made overlying the A1 pulley of the left index finger.  The pulley was incised. Thorough irrigation of the flexor tendon sheath distally was then performed.  There did not appear to be any proximal extension of pus into the palm of the hand.  Because of the nonviable nature of the skin and essentially the entire finger from the PIP joint distally, it was felt that this could continue to be a source for sepsis for the patient and therefore an amputation at this level was felt in his best interest.  The soft tissues were retracted proximally and the bone was cut just proximal to the PIP joint.  Further debridement of the skin, subcutaneous  tissue, and bone were then performed.  The flexor tendons were grasped and pulled and cut and allowed to retract proximally.  Following this, thorough irrigation of both the A1 pulley incision and the distal stump were then performed. Hemostasis was controlled with direct pressure after iodoform gauze was placed in the wound and a big bulky dressing was placed.  A 10 mL of 0.5% plain Marcaine were infiltrated around the wound for postoperative pain control.  The patient tolerated procedure well was taken recovery room in stable condition.     Dennie Bible, MD     HCC/MEDQ  D:  10/26/2013  T:  10/26/2013  Job:  [id number]

## 2013-10-26 NOTE — Significant Event (Signed)
Patient in surgery.transfered via bed ,vss.

## 2013-10-26 NOTE — Significant Event (Signed)
Patient returned from surgery via bed. LT hand ace warp bandage intact and dry ,elevated on pillows, circulation checks WNL. VSS A&OX4 no c/o pain at this time, diet tray requested.

## 2013-10-26 NOTE — ED Provider Notes (Signed)
Medical screening examination/treatment/procedure(s) were performed by non-physician practitioner and as supervising physician I was immediately available for consultation/collaboration.  Orpah Greek, MD 10/26/13 [phone]

## 2013-10-26 NOTE — Transfer of Care (Signed)
Immediate Anesthesia Transfer of Care Note  Patient: Timothy Madden  Procedure(s) Performed: Procedure(s): IRRIGATION AND DEBRIDEMENT EXTREMITY,PARTIAL AMPUTATION LEFT INDEX FINGER (Left)  Patient Location: PACU  Anesthesia Type:General  Level of Consciousness: awake, oriented, patient cooperative and responds to stimulation  Airway & Oxygen Therapy: Patient Spontanous Breathing and Patient connected to nasal cannula oxygen  Post-op Assessment: Report given to PACU RN, Post -op Vital signs reviewed and stable, Patient moving all extremities and Patient moving all extremities X 4  Post vital signs: Reviewed and stable  Complications: No apparent anesthesia complications

## 2013-10-26 NOTE — ED Notes (Signed)
Attempted to call report

## 2013-10-26 NOTE — H&P (Signed)
[facility] History and Physical  Timothy Madden [id number] DOB: 15-Aug-1960 DOA: 10/25/2013  Referring physician: ED physician PCP: No primary provider on file.   Chief Complaint: finger pain  HPI:  53 y.o. male with a hx of IDDM (noncomplaint due to financial difficulty) presented to the Emergency Department with main concern of several days duration of left pointer finger pain after burning it with hot plastic. He reports the finger has become more swollen and painful for the last several days and was using neosporin and pain medication but this has not offered any relief. He also reports subjective fevers, chills, feels like his sugar levels are high but is not sure as he has not been checking his CBG's and was not taking any medications over 2 months. Pt denies CP, SOB, abd pain. Pt also c/o gradual, persistent, 2 weeks duration of progressively worsening right arm weakness and cannot lift anything with his hand/arm. No aggravating or alleviating factors. Pt denies other focal neurological symptoms.   In ED, pt stable, mild tachy noted, XRAY of the left index finger suggestive of osteo and cellulitis, Hand surgeon called for assistance and will see in AM. TRH to admit. Keep NPO.  Assessment and Plan: Active Problems:   Osteomyelitis of finger of left hand - place on vanc and zosyn - provide IVF, analgesia, antiemetics as needed - appreciate surgery input   Sepsis secondary to osteo and cellulitis - pt meets criteria with fever, leukocytosis, tachycardia - broad spectrum ABX as pt is diabetic - IVF and supportive care   Acute renal failure - secondary to pre renal etiology - provide IVF and repeat BMP in AM   Hyponatremia - also pre renal, IVF as noted above should be adequate for management    Right arm weakness - unclear etiology, ? Radial nerve palsy - OT evaluation     DM, uncontrolled with complications of ? Neuropathy, ACD - check A1C, place on Lantus 10 U for  now and add SSI - diabetic educator consulted     Anemia of chronic disease  - no signs of active bleeding - repeat CBC in AM   HTN - place on Hydralazine for now and adjust the regimen as indicated    Radiological Exams on Admission: Ct Head Wo Contrast  10/26/2013   CLINICAL DATA:  Right arm weakness for 2 weeks.  Initial encounter.  EXAM: CT HEAD WITHOUT CONTRAST  TECHNIQUE: Contiguous axial images were obtained from the base of the skull through the vertex without intravenous contrast.  COMPARISON:  None.  FINDINGS: Skull and Sinuses:Negative for fracture or destructive process.  There is mild inflammatory mucosal thickening in the bilateral paranasal sinuses.  Orbits: No acute abnormality.  Brain: No evidence of acute abnormality, such as acute infarction, hemorrhage, hydrocephalus, or mass lesion/mass effect.  IMPRESSION: Negative head CT.   Electronically Signed   By: Jorje Guild M.D.   On: 10/26/2013 00:24   Dg Finger Index Left  10/26/2013   CLINICAL DATA:  Burn injury left index finger 1 week ago. Initial encounter  EXAM: LEFT INDEX FINGER 2+V  COMPARISON:  None.  FINDINGS: Marked soft tissue swelling surrounding the left index finger suggestive of cellulitis. Additionally, there is bony destruction of the left second distal phalanx suggestive of osteomyelitis.  IMPRESSION: Marked soft tissue swelling surrounding the left index finger suggestive of cellulitis. Additionally, there is bony destruction of the left second distal phalanx suggestive of osteomyelitis.   Electronically Signed   By: Richardson Landry  Jeannine Kitten M.D.   On: 10/26/2013 00:42     Code Status: Full Family Communication: Pt at bedside Disposition Plan: Admit for further evaluation     Review of Systems:  Constitutional:Negative for diaphoresis.  HENT: Negative for hearing loss, ear pain, nosebleeds, congestion, sore throat, neck pain, tinnitus and ear discharge.   Eyes: Negative for blurred vision, double vision,  photophobia, pain, discharge and redness.  Respiratory: Negative for cough, hemoptysis, sputum production, shortness of breath, wheezing and stridor.   Cardiovascular: Negative for chest pain, palpitations, orthopnea, claudication and leg swelling.  Gastrointestinal: Negative for nausea, vomiting and abdominal pain. Genitourinary: Negative for  hematuria and flank pain.  Musculoskeletal: Negative for myalgias, back pain, joint pain and falls.  Skin: Negative for itching and rash.  Neurological: Negative for dizziness Endo/Heme/Allergies: Negative for environmental allergies and polydipsia. Does not bruise/bleed easily.  Psychiatric/Behavioral: Negative for suicidal ideas. The patient is not nervous/anxious.      Past Medical History  Diagnosis Date  . Diabetes mellitus without complication     History reviewed. No pertinent past surgical history.  Social History:  reports that he has been smoking.  His smokeless tobacco use includes Chew. He reports that he does not drink alcohol or use illicit drugs.  Not on File  No pertinent family medical history   Prior to Admission medications   Medication Sig Start Date End Date Taking? Authorizing Provider  benztropine (COGENTIN) 2 MG tablet Take 2 mg by mouth daily.   Yes Historical Provider, MD  ibuprofen (ADVIL,MOTRIN) 200 MG tablet Take 400 mg by mouth every 6 (six) hours as needed for moderate pain.   Yes Historical Provider, MD    Physical Exam: Filed Vitals:   10/26/13 0100 10/26/13 0115 10/26/13 0130 10/26/13 0145  BP: 154/107 143/95 131/92 148/88  Pulse: 105 107 108 113  Temp:      TempSrc:      Resp: 17 14 14 18   Height:      Weight:      SpO2: 100% 97% 97% 97%    Physical Exam  Constitutional: Appears well-developed and well-nourished. No distress.  HENT: Normocephalic. External right and left ear normal. Dry Mm Eyes: Conjunctivae and EOM are normal. PERRLA, no scleral icterus.  Neck: Normal ROM. Neck supple. No JVD.  No tracheal deviation. No thyromegaly.  CVS: Regular rhythm, tachy, S1/S2 +, no murmurs, no gallops, no carotid bruit.  Pulmonary: Effort and breath sounds normal, no stridor, rhonchi, wheezes, rales.  Abdominal: Soft. BS +,  no distension, tenderness, rebound or guarding.  Musculoskeletal: Normal range of motion. Left pointer finger with large fluctuant pocket of pus, TTP Lymphadenopathy: No lymphadenopathy noted, cervical, inguinal. Neuro: Alert. Normal reflexes, muscle tone coordination. No cranial nerve deficit. Skin: Skin is warm and dry. No rash noted. Not diaphoretic. No erythema. No pallor.  Psychiatric: Normal mood and affect.   Labs on Admission:  Basic Metabolic Panel:  Recent Labs Lab 10/25/13 2250  NA 129*  K 5.2  CL 93*  CO2 19  GLUCOSE 588*  BUN 42*  CREATININE 2.42*  CALCIUM 10.1   Liver Function Tests:  Recent Labs Lab 10/25/13 2250  AST 15  ALT 17  ALKPHOS 132*  BILITOT 0.3  PROT 9.3*  ALBUMIN 3.2*   CBC:  Recent Labs Lab 10/25/13 2250  WBC 15.2*  NEUTROABS 11.7*  HGB 11.2*  HCT 32.7*  MCV 82.0  PLT 375   CBG:  Recent Labs Lab 10/25/13 2249  GLUCAP 529*  EKG: Normal sinus rhythm, no ST/T wave changes  Faye Ramsay, MD  [facility] Pager [phone]  If 7PM-7AM, please contact night-coverage [email or web address] Password [email or web address] 10/26/2013, 1:59 AM

## 2013-10-26 NOTE — Anesthesia Postprocedure Evaluation (Signed)
  Anesthesia Post-op Note  Patient: Timothy Madden  Procedure(s) Performed: Procedure(s): IRRIGATION AND DEBRIDEMENT EXTREMITY,PARTIAL AMPUTATION LEFT INDEX FINGER (Left)  Patient Location: PACU  Anesthesia Type:General  Level of Consciousness: awake, alert , oriented and patient cooperative  Airway and Oxygen Therapy: Patient Spontanous Breathing  Post-op Pain: mild  Post-op Assessment: Post-op Vital signs reviewed, Patient's Cardiovascular Status Stable, Respiratory Function Stable, Patent Airway, No signs of Nausea or vomiting and Pain level controlled  Post-op Vital Signs: stable  Last Vitals:  Filed Vitals:   10/26/13 2019  BP: 144/93  Pulse: 91  Temp: 36.7 C  Resp: 18    Complications: No apparent anesthesia complications

## 2013-10-26 NOTE — Consult Note (Signed)
Reason for Consult:infected left index finger Referring Physician: ER  CC:My finger hurts  HPI:  Timothy Madden is an 53 y.o. right handed male who presents with severe swelling of left index finger. Pt states finger has become swollen, ?burn for ~2 wks.  Is unable to bend finger.  Has not been compliant with diabetic meds.       Pain is rated at   8 /10 and is described as sharp/dull.  Pain is constant.  Pain is made better by rest/immobilization, worse with motion.   Associated signs/symptoms: bs high, sepsis Previous treatment:    Past Medical History  Diagnosis Date  . Diabetes mellitus without complication     History reviewed. No pertinent past surgical history.  History reviewed. No pertinent family history.  Social History:  reports that he has been smoking.  His smokeless tobacco use includes Chew. He reports that he does not drink alcohol or use illicit drugs.  Allergies: No Known Allergies  Medications: I have reviewed the patient's current medications.  Results for orders placed during the hospital encounter of 10/25/13 (from the past 48 hour(s))  CBG MONITORING, ED     Status: Abnormal   Collection Time    10/25/13 10:49 PM      Result Value Ref Range   Glucose-Capillary 529 (*) 70 - 99 mg/dL   Comment 1 Documented in Chart     Comment 2 Notify RN    ETHANOL     Status: None   Collection Time    10/25/13 10:50 PM      Result Value Ref Range   Alcohol, Ethyl (B) <11  0 - 11 mg/dL   Comment:            LOWEST DETECTABLE LIMIT FOR     SERUM ALCOHOL IS 11 mg/dL     FOR MEDICAL PURPOSES ONLY  PROTIME-INR     Status: None   Collection Time    10/25/13 10:50 PM      Result Value Ref Range   Prothrombin Time 14.5  11.6 - 15.2 seconds   INR 1.11  0.00 - 1.49  APTT     Status: None   Collection Time    10/25/13 10:50 PM      Result Value Ref Range   aPTT 33  24 - 37 seconds  CBC     Status: Abnormal   Collection Time    10/25/13 10:50 PM      Result Value  Ref Range   WBC 15.2 (*) 4.0 - 10.5 K/uL   RBC 3.99 (*) 4.22 - 5.81 MIL/uL   Hemoglobin 11.2 (*) 13.0 - 17.0 g/dL   HCT 32.7 (*) 39.0 - 52.0 %   MCV 82.0  78.0 - 100.0 fL   MCH 28.1  26.0 - 34.0 pg   MCHC 34.3  30.0 - 36.0 g/dL   RDW 13.0  11.5 - 15.5 %   Platelets 375  150 - 400 K/uL  DIFFERENTIAL     Status: Abnormal   Collection Time    10/25/13 10:50 PM      Result Value Ref Range   Neutrophils Relative % 77  43 - 77 %   Lymphocytes Relative 15  12 - 46 %   Monocytes Relative 8  3 - 12 %   Eosinophils Relative 0  0 - 5 %   Basophils Relative 0  0 - 1 %   Neutro Abs 11.7 (*) 1.7 - 7.7 K/uL  Lymphs Abs 2.3  0.7 - 4.0 K/uL   Monocytes Absolute 1.2 (*) 0.1 - 1.0 K/uL   Eosinophils Absolute 0.0  0.0 - 0.7 K/uL   Basophils Absolute 0.0  0.0 - 0.1 K/uL   WBC Morphology MILD LEFT SHIFT (1-5% METAS, OCC MYELO, OCC BANDS)    COMPREHENSIVE METABOLIC PANEL     Status: Abnormal   Collection Time    10/25/13 10:50 PM      Result Value Ref Range   Sodium 129 (*) 137 - 147 mEq/L   Potassium 5.2  3.7 - 5.3 mEq/L   Chloride 93 (*) 96 - 112 mEq/L   CO2 19  19 - 32 mEq/L   Glucose, Bld 588 (*) 70 - 99 mg/dL   Comment: CRITICAL RESULT CALLED TO, READ BACK BY AND VERIFIED WITH:     WATTS J,RN 10/25/13 2331 WAYK   BUN 42 (*) 6 - 23 mg/dL   Creatinine, Ser 2.42 (*) 0.50 - 1.35 mg/dL   Calcium 10.1  8.4 - 10.5 mg/dL   Total Protein 9.3 (*) 6.0 - 8.3 g/dL   Albumin 3.2 (*) 3.5 - 5.2 g/dL   AST 15  0 - 37 U/L   ALT 17  0 - 53 U/L   Alkaline Phosphatase 132 (*) 39 - 117 U/L   Total Bilirubin 0.3  0.3 - 1.2 mg/dL   GFR calc non Af Amer 29 (*) >90 mL/min   GFR calc Af Amer 34 (*) >90 mL/min   Comment: (NOTE)     The eGFR has been calculated using the CKD EPI equation.     This calculation has not been validated in all clinical situations.     eGFR's persistently <90 mL/min signify possible Chronic Kidney     Disease.   Anion gap 17 (*) 5 - 15  SEDIMENTATION RATE     Status: Abnormal    Collection Time    10/25/13 10:50 PM      Result Value Ref Range   Sed Rate 125 (*) 0 - 16 mm/hr  I-STAT CG4 LACTIC ACID, ED     Status: None   Collection Time    10/25/13 10:59 PM      Result Value Ref Range   Lactic Acid, Venous 1.36  0.5 - 2.2 mmol/L  I-STAT VENOUS BLOOD GAS, ED     Status: Abnormal   Collection Time    10/25/13 11:05 PM      Result Value Ref Range   pH, Ven 7.407 (*) 7.250 - 7.300   pCO2, Ven 37.7 (*) 45.0 - 50.0 mmHg   pO2, Ven 86.0 (*) 30.0 - 45.0 mmHg   Bicarbonate 23.8  20.0 - 24.0 mEq/L   TCO2 25  0 - 100 mmol/L   O2 Saturation 97.0     Acid-base deficit 1.0  0.0 - 2.0 mmol/L   Patient temperature 98.1 F     Sample type VENOUS    URINE RAPID DRUG SCREEN (HOSP PERFORMED)     Status: None   Collection Time    10/26/13 12:34 AM      Result Value Ref Range   Opiates NONE DETECTED  NONE DETECTED   Cocaine NONE DETECTED  NONE DETECTED   Benzodiazepines NONE DETECTED  NONE DETECTED   Amphetamines NONE DETECTED  NONE DETECTED   Tetrahydrocannabinol NONE DETECTED  NONE DETECTED   Barbiturates NONE DETECTED  NONE DETECTED   Comment:            DRUG  SCREEN FOR MEDICAL PURPOSES     ONLY.  IF CONFIRMATION IS NEEDED     FOR ANY PURPOSE, NOTIFY LAB     WITHIN 5 DAYS.                LOWEST DETECTABLE LIMITS     FOR URINE DRUG SCREEN     Drug Class       Cutoff (ng/mL)     Amphetamine      1000     Barbiturate      200     Benzodiazepine   811     Tricyclics       031     Opiates          300     Cocaine          300     THC              50  URINALYSIS, ROUTINE W REFLEX MICROSCOPIC     Status: Abnormal   Collection Time    10/26/13 12:34 AM      Result Value Ref Range   Color, Urine YELLOW  YELLOW   APPearance CLEAR  CLEAR   Specific Gravity, Urine 1.016  1.005 - 1.030   pH 5.5  5.0 - 8.0   Glucose, UA >1000 (*) NEGATIVE mg/dL   Hgb urine dipstick SMALL (*) NEGATIVE   Bilirubin Urine NEGATIVE  NEGATIVE   Ketones, ur NEGATIVE  NEGATIVE mg/dL    Protein, ur NEGATIVE  NEGATIVE mg/dL   Urobilinogen, UA 0.2  0.0 - 1.0 mg/dL   Nitrite NEGATIVE  NEGATIVE   Leukocytes, UA NEGATIVE  NEGATIVE  URINE MICROSCOPIC-ADD ON     Status: None   Collection Time    10/26/13 12:34 AM      Result Value Ref Range   Squamous Epithelial / LPF RARE  RARE   WBC, UA 0-2  <3 WBC/hpf   RBC / HPF 0-2  <3 RBC/hpf   Bacteria, UA RARE  RARE  GLUCOSE, CAPILLARY     Status: Abnormal   Collection Time    10/26/13  3:54 AM      Result Value Ref Range   Glucose-Capillary 189 (*) 70 - 99 mg/dL  BASIC METABOLIC PANEL     Status: Abnormal   Collection Time    10/26/13  4:18 AM      Result Value Ref Range   Sodium 142  137 - 147 mEq/L   Comment: DELTA CHECK NOTED   Potassium 4.2  3.7 - 5.3 mEq/L   Comment: DELTA CHECK NOTED   Chloride 107  96 - 112 mEq/L   CO2 20  19 - 32 mEq/L   Glucose, Bld 174 (*) 70 - 99 mg/dL   BUN 35 (*) 6 - 23 mg/dL   Creatinine, Ser 2.10 (*) 0.50 - 1.35 mg/dL   Calcium 9.6  8.4 - 10.5 mg/dL   GFR calc non Af Amer 35 (*) >90 mL/min   GFR calc Af Amer 40 (*) >90 mL/min   Comment: (NOTE)     The eGFR has been calculated using the CKD EPI equation.     This calculation has not been validated in all clinical situations.     eGFR's persistently <90 mL/min signify possible Chronic Kidney     Disease.   Anion gap 15  5 - 15  CBC     Status: Abnormal   Collection Time    10/26/13  4:18 AM  Result Value Ref Range   WBC 14.8 (*) 4.0 - 10.5 K/uL   RBC 3.68 (*) 4.22 - 5.81 MIL/uL   Hemoglobin 10.3 (*) 13.0 - 17.0 g/dL   HCT 30.0 (*) 39.0 - 52.0 %   MCV 81.5  78.0 - 100.0 fL   MCH 28.0  26.0 - 34.0 pg   MCHC 34.3  30.0 - 36.0 g/dL   RDW 13.0  11.5 - 15.5 %   Platelets 368  150 - 400 K/uL  TSH     Status: Abnormal   Collection Time    10/26/13  4:18 AM      Result Value Ref Range   TSH 0.344 (*) 0.350 - 4.500 uIU/mL  LIPID PANEL     Status: Abnormal   Collection Time    10/26/13  4:18 AM      Result Value Ref Range    Cholesterol 180  0 - 200 mg/dL   Triglycerides 148  <150 mg/dL   HDL 38 (*) >39 mg/dL   Total CHOL/HDL Ratio 4.7     VLDL 30  0 - 40 mg/dL   LDL Cholesterol 112 (*) 0 - 99 mg/dL   Comment:            Total Cholesterol/HDL:CHD Risk     Coronary Heart Disease Risk Table                         Men   Women      1/2 Average Risk   3.4   3.3      Average Risk       5.0   4.4      2 X Average Risk   9.6   7.1      3 X Average Risk  23.4   11.0                Use the calculated Patient Ratio     above and the CHD Risk Table     to determine the patient's CHD Risk.                ATP III CLASSIFICATION (LDL):      <100     mg/dL   Optimal      100-129  mg/dL   Near or Above                        Optimal      130-159  mg/dL   Borderline      160-189  mg/dL   High      >190     mg/dL   Very High    Ct Head Wo Contrast  10/26/2013   CLINICAL DATA:  Right arm weakness for 2 weeks.  Initial encounter.  EXAM: CT HEAD WITHOUT CONTRAST  TECHNIQUE: Contiguous axial images were obtained from the base of the skull through the vertex without intravenous contrast.  COMPARISON:  None.  FINDINGS: Skull and Sinuses:Negative for fracture or destructive process.  There is mild inflammatory mucosal thickening in the bilateral paranasal sinuses.  Orbits: No acute abnormality.  Brain: No evidence of acute abnormality, such as acute infarction, hemorrhage, hydrocephalus, or mass lesion/mass effect.  IMPRESSION: Negative head CT.   Electronically Signed   By: Jorje Guild M.D.   On: 10/26/2013 00:24   Dg Finger Index Left  10/26/2013   CLINICAL DATA:  Burn injury left index finger 1 week  ago. Initial encounter  EXAM: LEFT INDEX FINGER 2+V  COMPARISON:  None.  FINDINGS: Marked soft tissue swelling surrounding the left index finger suggestive of cellulitis. Additionally, there is bony destruction of the left second distal phalanx suggestive of osteomyelitis.  IMPRESSION: Marked soft tissue swelling surrounding  the left index finger suggestive of cellulitis. Additionally, there is bony destruction of the left second distal phalanx suggestive of osteomyelitis.   Electronically Signed   By: Chauncey Cruel M.D.   On: 10/26/2013 00:42    Pertinent items are noted in HPI. Temp:  [99.1 F (37.3 C)-99.2 F (37.3 C)] 99.2 F (37.3 C) (10/17 0246) Pulse Rate:  [95-118] 100 (10/17 0217) Resp:  [13-21] 18 (10/17 0246) BP: (131-166)/(88-110) 155/93 mmHg (10/17 0246) SpO2:  [95 %-100 %] 99 % (10/17 0246) Weight:  [71.079 kg (156 lb 11.2 oz)-100.699 kg (222 lb)] 71.079 kg (156 lb 11.2 oz) (10/17 0246) General appearance: alert and cooperative Resp: clear to auscultation bilaterally Cardio: regular rate and rhythm Extremities: severe swelling and blister formation of entire left index finger, tip of finger with obvious fluctuance, minimal motion of finger, adjacent fingers not involved   Assessment: Infection of LIF with xray evidence of osteo of distal phalanx Plan: Need I&D, amputation of distal phalanx, cont bs iv abx, will leave finger open for dressing changes before closure when wound looks good, infection improved. I have discussed this treatment plan in detail with patient, including the risks of the recommended treatment or surgery, the benefits and the alternatives.  The patient understands that additional treatment may be necessary.  Millie Shorb CHRISTOPHER 10/26/2013, 8:09 AM

## 2013-10-26 NOTE — ED Provider Notes (Signed)
Patient presented to the ER with complaints. Patient complaining of problems with his right arm. He also reports injury to the left finger when he burned it on a hot pot. This occurred approximately a week ago. The patient has been out of his insulin for more than 2 months, blood sugar has been running high.  Face to face Exam: HEENT - PERRLA Lungs - CTAB Heart - RRR, no M/R/G Abd - S/NT/ND Neuro - alert, oriented x3. Right upper extremity examination reveals normal strength at shoulder and elbow. Patient has no extension at the wrist consistent with peripheral radial nerve palsy. Muscle skeletal - significant swelling, edema left index finger, circumferentially around the entire finger.  Plan: Uncontrolled diabetes, initiate IV fluids and treatment for her elevated blood sugar. Patient's complaints of inability to move her right hand for the last 2 weeks appears to be secondary to peripheral neuropathy, specifically radial nerve palsy. Patient reports burning the left index finger. This does not look like a burn, he appears to have a circumferential abscess of the finger, will require surgical intervention. Patient will require hospitalization for further management of multiple problems including hyperglycemia and left finger infection.   Orpah Greek, MD 10/26/13 0010

## 2013-10-26 NOTE — ED Notes (Signed)
Discussed plan of care with [place], [place]. Patient's CBG after liter and insulin at 1am was 412.  Plan for administering 2nd liter bolus, and plan for admission.

## 2013-10-26 NOTE — ED Notes (Signed)
Transporting patient to new room assignment. 

## 2013-10-26 NOTE — Anesthesia Preprocedure Evaluation (Addendum)
Anesthesia Evaluation  Patient identified by MRN, date of birth, ID band Patient awake    Reviewed: Allergy & Precautions, H&P , NPO status , Patient's Chart, lab work & pertinent test results  Airway       Dental   Pulmonary Current Smoker,          Cardiovascular hypertension,     Neuro/Psych Anxiety    GI/Hepatic   Endo/Other  diabetes, Type 2, Oral Hypoglycemic Agents  Renal/GU Renal disease     Musculoskeletal   Abdominal   Peds  Hematology   Anesthesia Other Findings   Reproductive/Obstetrics                          Anesthesia Physical Anesthesia Plan  ASA: III  Anesthesia Plan: General   Post-op Pain Management:    Induction: Intravenous  Airway Management Planned: LMA  Additional Equipment:   Intra-op Plan:   Post-operative Plan: Extubation in OR  Informed Consent: I have reviewed the patients History and Physical, chart, labs and discussed the procedure including the risks, benefits and alternatives for the proposed anesthesia with the patient or authorized representative who has indicated his/her understanding and acceptance.     Plan Discussed with: CRNA, Anesthesiologist and Surgeon  Anesthesia Plan Comments:         Anesthesia Quick Evaluation
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
## 2013-10-26 NOTE — ED Notes (Signed)
CBG taken = 412

## 2013-10-26 NOTE — ED Notes (Signed)
Attempted call report 

## 2013-10-26 NOTE — Anesthesia Procedure Notes (Signed)
Procedure Name: LMA Insertion Date/Time: 10/26/2013 3:49 PM Performed by: Jacquiline Doe A Pre-anesthesia Checklist: Patient identified, Timeout performed, Emergency Drugs available, Suction available and Patient being monitored Patient Re-evaluated:Patient Re-evaluated prior to inductionOxygen Delivery Method: Circle system utilized Preoxygenation: Pre-oxygenation with 100% oxygen Intubation Type: IV induction Ventilation: Mask ventilation without difficulty LMA: LMA inserted LMA Size: 4.0 Tube type: Oral Number of attempts: 1 Placement Confirmation: breath sounds checked- equal and bilateral and positive ETCO2 Tube secured with: Tape Dental Injury: Teeth and Oropharynx as per pre-operative assessment

## 2013-10-27 DIAGNOSIS — E1069 Type 1 diabetes mellitus with other specified complication: Secondary | ICD-10-CM

## 2013-10-27 LAB — BASIC METABOLIC PANEL
ANION GAP: 15 (ref 5–15)
BUN: 36 mg/dL — ABNORMAL HIGH (ref 6–23)
CALCIUM: 9.5 mg/dL (ref 8.4–10.5)
CO2: 21 mEq/L (ref 19–32)
Chloride: 107 mEq/L (ref 96–112)
Creatinine, Ser: 2.03 mg/dL — ABNORMAL HIGH (ref 0.50–1.35)
GFR calc Af Amer: 42 mL/min — ABNORMAL LOW (ref >90)
GFR calc non Af Amer: 36 mL/min — ABNORMAL LOW (ref >90)
Glucose, Bld: 425 mg/dL — ABNORMAL HIGH (ref 70–99)
Potassium: 5.2 mEq/L (ref 3.7–5.3)
SODIUM: 143 meq/L (ref 137–147)

## 2013-10-27 LAB — GLUCOSE, CAPILLARY
GLUCOSE-CAPILLARY: 385 mg/dL — AB (ref 70–99)
GLUCOSE-CAPILLARY: 429 mg/dL — AB (ref 70–99)
Glucose-Capillary: 411 mg/dL — ABNORMAL HIGH (ref 70–99)
Glucose-Capillary: 386 mg/dL — ABNORMAL HIGH (ref 70–99)
Glucose-Capillary: 333 mg/dL — ABNORMAL HIGH (ref 70–99)

## 2013-10-27 LAB — HEMOGLOBIN A1C: Hgb A1c MFr Bld: >20 % — ABNORMAL HIGH (ref <5.7)

## 2013-10-27 MED ORDER — INSULIN GLARGINE 100 UNIT/ML ~~LOC~~ SOLN
10.0000 [IU] | Freq: Two times a day (BID) | SUBCUTANEOUS | Status: DC
  Filled 2013-10-27 (×2): qty 0.1

## 2013-10-27 MED ORDER — INSULIN GLARGINE 100 UNIT/ML ~~LOC~~ SOLN
20.0000 [IU] | Freq: Two times a day (BID) | SUBCUTANEOUS | Status: DC
  Administered 2013-10-27 (×2): 20 [IU] via SUBCUTANEOUS
  Filled 2013-10-27 (×4): qty 0.2

## 2013-10-27 NOTE — Progress Notes (Signed)
S:  Sitting up in bed eating, overall looks better, mild hand pain.  Explained to pt severity of infection and procedure performed and plan.  O:Blood pressure 148/90, pulse 76, temperature 98.2 F (36.8 C), temperature source Oral, resp. rate 18, height 6\' 3"  (1.905 m), weight 68.584 kg (151 lb 3.2 oz), SpO2 100.00%.  LIF amputation site, o gross purulence, some nice pink tissue!  Packing removed, dressing reapplied  A:severe infection, osteomyelitis LIF s/p i&d, partial LIF amputation   P:start wound care, cont abx, f/u cx's, will need additional surgery when wound clean, infection cleared to closed wound.
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
## 2013-10-27 NOTE — Significant Event (Signed)
Dr Venetia Constable called regarding patients high cbg orders received.

## 2013-10-27 NOTE — Progress Notes (Signed)
Utilization Review Completed.   Alvy Alsop, RN, BSN Nurse Case Manager  

## 2013-10-28 LAB — GLUCOSE, CAPILLARY
GLUCOSE-CAPILLARY: 260 mg/dL — AB (ref 70–99)
GLUCOSE-CAPILLARY: 266 mg/dL — AB (ref 70–99)
Glucose-Capillary: 412 mg/dL — ABNORMAL HIGH (ref 70–99)
Glucose-Capillary: 256 mg/dL — ABNORMAL HIGH (ref 70–99)
Glucose-Capillary: 318 mg/dL — ABNORMAL HIGH (ref 70–99)
Glucose-Capillary: 360 mg/dL — ABNORMAL HIGH (ref 70–99)

## 2013-10-28 LAB — BASIC METABOLIC PANEL
Anion gap: 11 (ref 5–15)
BUN: 27 mg/dL — AB (ref 6–23)
CHLORIDE: 119 meq/L — AB (ref 96–112)
CO2: 20 meq/L (ref 19–32)
Calcium: 7.8 mg/dL — ABNORMAL LOW (ref 8.4–10.5)
Creatinine, Ser: 1.68 mg/dL — ABNORMAL HIGH (ref 0.50–1.35)
GFR calc Af Amer: 52 mL/min — ABNORMAL LOW (ref >90)
GFR calc non Af Amer: 45 mL/min — ABNORMAL LOW (ref >90)
Glucose, Bld: 218 mg/dL — ABNORMAL HIGH (ref 70–99)
Potassium: 3.8 mEq/L (ref 3.7–5.3)
Sodium: 150 mEq/L — ABNORMAL HIGH (ref 137–147)

## 2013-10-28 MED ORDER — INSULIN GLARGINE 100 UNIT/ML ~~LOC~~ SOLN
30.0000 [IU] | Freq: Two times a day (BID) | SUBCUTANEOUS | Status: DC
  Administered 2013-10-28 – 2013-10-31 (×7): 30 [IU] via SUBCUTANEOUS
  Filled 2013-10-28 (×9): qty 0.3

## 2013-10-28 MED ORDER — CIPROFLOXACIN HCL 500 MG PO TABS
500.0000 mg | ORAL_TABLET | Freq: Two times a day (BID) | ORAL | Status: DC
  Administered 2013-10-28 – 2013-11-02 (×11): 500 mg via ORAL
  Filled 2013-10-28 (×14): qty 1

## 2013-10-28 MED ORDER — DOXYCYCLINE HYCLATE 100 MG PO TABS
100.0000 mg | ORAL_TABLET | Freq: Two times a day (BID) | ORAL | Status: DC
  Administered 2013-10-28 – 2013-11-02 (×11): 100 mg via ORAL
  Filled 2013-10-28 (×12): qty 1

## 2013-10-28 MED ORDER — HYDROCODONE-ACETAMINOPHEN 5-325 MG PO TABS
1.0000 | ORAL_TABLET | Freq: Four times a day (QID) | ORAL | Status: DC | PRN
  Administered 2013-10-28: 1 via ORAL
  Filled 2013-10-28 (×2): qty 1

## 2013-10-28 NOTE — Progress Notes (Signed)
Report Given to receiving RN. Patient in bed resting. No verbal complaints.

## 2013-10-28 NOTE — Progress Notes (Addendum)
[facility] PROGRESS NOTE  Assessment/Plan: Sepsis due Osteomyelitis of finger of left hand - consulted hand surgeon, S/p I&D, amputation of distal phalanx on 10.18.2015, - provided IV analgesic, started on IV Vanc and zosyn.de-escalate to doxy and cipro. - has remained afebrile. Monitor overnight.  AKI (acute kidney injury) - Previous cr normal 2009. - Cr cont to improve.  Hypernatremia: - Due to NS, D/c NS, allow free water. - b-met in am.  Type I diabetes mellitus with complication, uncontrolled - improving with insulin titration.   Essential hypertension - high cont hydralazine will need ACE-I as an outpatient.  Anemia of chronic disease    Code Status: Full  Family Communication: Pt at bedside  Disposition Plan: Admit for further evaluation     Consultants:  Orthopedic   Procedures: Left finger x-ray : Marked soft tissue swelling surrounding the left index finger suggestive of cellulitis. Additionally, there is bony destruction of the left second distal phalanx suggestive of osteomyelitis  Antibiotics:  vanc and zosyn 10.17.2015  HPI/Subjective: No complalins  Objective: Filed Vitals:   10/27/13 0520 10/27/13 1410 10/27/13 2210 10/28/13 0648  BP: 148/90 147/92 138/85 132/68  Pulse: 76 89 71 71  Temp: 98.2 F (36.8 C) 98.7 F (37.1 C) 98.3 F (36.8 C) 97.7 F (36.5 C)  TempSrc: Oral Oral Oral Oral  Resp: '18 20 18 18  ' Height:      Weight: 68.584 kg (151 lb 3.2 oz)   69.264 kg (152 lb 11.2 oz)  SpO2: 100% 100% 95% 100%    Intake/Output Summary (Last 24 hours) at 10/28/13 0845 Last data filed at 10/28/13 0240  Gross per 24 hour  Intake    960 ml  Output   2800 ml  Net  -1840 ml   Filed Weights   10/26/13 0246 10/27/13 0520 10/28/13 0648  Weight: 71.079 kg (156 lb 11.2 oz) 68.584 kg (151 lb 3.2 oz) 69.264 kg (152 lb 11.2 oz)    Exam:  General: Alert, awake, oriented x3, in no acute distress.  HEENT: No bruits, no goiter.    Heart: Regular rate and rhythm. Lungs: Good air movement, clear   Data Reviewed: Basic Metabolic Panel:  Recent Labs Lab 10/25/13 2250 10/26/13 0418 10/27/13 1030 10/28/13 0500  NA 129* 142 143 150*  K 5.2 4.2 5.2 3.8  CL 93* 107 107 119*  CO2 '19 20 21 20  ' GLUCOSE 588* 174* 425* 218*  BUN 42* 35* 36* 27*  CREATININE 2.42* 2.10* 2.03* 1.68*  CALCIUM 10.1 9.6 9.5 7.8*   Liver Function Tests:  Recent Labs Lab 10/25/13 2250  AST 15  ALT 17  ALKPHOS 132*  BILITOT 0.3  PROT 9.3*  ALBUMIN 3.2*   No results found for this basename: LIPASE, AMYLASE,  in the last 168 hours No results found for this basename: AMMONIA,  in the last 168 hours CBC:  Recent Labs Lab 10/25/13 2250 10/26/13 0418  WBC 15.2* 14.8*  NEUTROABS 11.7*  --   HGB 11.2* 10.3*  HCT 32.7* 30.0*  MCV 82.0 81.5  PLT 375 368   Cardiac Enzymes: No results found for this basename: CKTOTAL, CKMB, CKMBINDEX, TROPONINI,  in the last 168 hours BNP (last 3 results) No results found for this basename: PROBNP,  in the last 8760 hours CBG:  Recent Labs Lab 10/27/13 1205 10/27/13 1619 10/27/13 2205 10/28/13 0555 10/28/13 0654  GLUCAP 411* 386* 333* 260* 256*    Recent Results (from the past 240 hour(s))  SURGICAL PCR  SCREEN     Status: None   Collection Time    10/26/13  9:16 AM      Result Value Ref Range Status   MRSA, PCR NEGATIVE  NEGATIVE Final   Staphylococcus aureus NEGATIVE  NEGATIVE Final   Comment:            The Xpert SA Assay (FDA     approved for NASAL specimens     in patients over 102 years of age),     is one component of     a comprehensive surveillance     program.  Test performance has     been validated by [employer] for patients greater     than or equal to 9 year old.     It is not intended     to diagnose infection nor to     guide or monitor treatment.  ANAEROBIC CULTURE     Status: None   Collection Time    10/26/13  4:10 PM      Result Value Ref Range  Status   Specimen Description FINGER LEFT   Final   Special Requests PATIENT ON FOLLOWING ZOSYN VANC   Final   Gram Stain     Final   Value: ABUNDANT WBC PRESENT,BOTH PMN AND MONONUCLEAR     MODERATE SQUAMOUS EPITHELIAL CELLS PRESENT     ABUNDANT GRAM POSITIVE COCCI IN PAIRS AND CHAINS     MODERATE YEAST     Performed at [facility]   Culture     Final   Value: NO ANAEROBES ISOLATED; CULTURE IN PROGRESS FOR 5 DAYS     Performed at [facility]   Report Status PENDING   Incomplete  CULTURE, ROUTINE-ABSCESS     Status: None   Collection Time    10/26/13  4:10 PM      Result Value Ref Range Status   Specimen Description FINGER LEFT   Final   Special Requests PATIENT ON FOLLOWING ZOSYN VANC   Final   Gram Stain     Final   Value: MODERATE WBC PRESENT,BOTH PMN AND MONONUCLEAR     RARE SQUAMOUS EPITHELIAL CELLS PRESENT     ABUNDANT GRAM POSITIVE COCCI IN PAIRS AND CHAINS     MODERATE YEAST     Performed at [facility]   Culture     Final   Value: Culture reincubated for better growth     Performed at [facility]   Report Status PENDING   Incomplete     Studies: No results found.  Scheduled Meds: . ciprofloxacin  500 mg Oral BID  . doxycycline  100 mg Oral Q12H  . enoxaparin (LOVENOX) injection  40 mg Subcutaneous Daily  . insulin aspart  0-5 Units Subcutaneous QHS  . insulin aspart  0-9 Units Subcutaneous TID WC  . insulin glargine  30 Units Subcutaneous BID  . sodium chloride  3 mL Intravenous Q12H   Continuous Infusions: . sodium chloride 10 mL/hr at 10/26/13 [place], Timothy Madden  [facility] Pager [phone]. If 8PM-8AM, please contact night-coverage at [email or web address], password [facility] 10/28/2013, 8:45 AM  LOS: 3 days

## 2013-10-28 NOTE — Progress Notes (Signed)
S: pt doing better, does not offer any hand complaints  O:Blood pressure 161/80, pulse 104, temperature 97.7 F (36.5 C), temperature source Oral, resp. rate 18, height 6\' 3"  (1.905 m), weight 69.264 kg (152 lb 11.2 oz), SpO2 100.00%.  Dressing changed: nice pink granulation tissue, no purulent drainage  A:severe infection s/p partial IF amputation   P:cont abx, wound care; will look at wound closure (OR) Tues or Wed.
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
## 2013-10-29 ENCOUNTER — Encounter (HOSPITAL_COMMUNITY): Payer: Self-pay | Admitting: General Surgery

## 2013-10-29 LAB — GLUCOSE, CAPILLARY
GLUCOSE-CAPILLARY: 139 mg/dL — AB (ref 70–99)
GLUCOSE-CAPILLARY: 281 mg/dL — AB (ref 70–99)
GLUCOSE-CAPILLARY: 314 mg/dL — AB (ref 70–99)
Glucose-Capillary: 106 mg/dL — ABNORMAL HIGH (ref 70–99)

## 2013-10-29 LAB — BASIC METABOLIC PANEL
ANION GAP: 14 (ref 5–15)
BUN: 31 mg/dL — ABNORMAL HIGH (ref 6–23)
CHLORIDE: 115 meq/L — AB (ref 96–112)
CO2: 23 meq/L (ref 19–32)
Calcium: 9.9 mg/dL (ref 8.4–10.5)
Creatinine, Ser: 1.76 mg/dL — ABNORMAL HIGH (ref 0.50–1.35)
GFR calc Af Amer: 50 mL/min — ABNORMAL LOW (ref >90)
GFR calc non Af Amer: 43 mL/min — ABNORMAL LOW (ref >90)
GLUCOSE: 119 mg/dL — AB (ref 70–99)
POTASSIUM: 4 meq/L (ref 3.7–5.3)
SODIUM: 152 meq/L — AB (ref 137–147)

## 2013-10-29 LAB — CULTURE, ROUTINE-ABSCESS

## 2013-10-29 MED ORDER — INSULIN ASPART 100 UNIT/ML ~~LOC~~ SOLN
6.0000 [IU] | Freq: Three times a day (TID) | SUBCUTANEOUS | Status: DC
  Administered 2013-10-29 – 2013-10-31 (×6): 6 [IU] via SUBCUTANEOUS

## 2013-10-29 MED ORDER — INSULIN ASPART 100 UNIT/ML ~~LOC~~ SOLN: 0.0000 [IU] | Freq: Every day | SUBCUTANEOUS | Status: DC

## 2013-10-29 MED ORDER — INSULIN ASPART 100 UNIT/ML ~~LOC~~ SOLN
0.0000 [IU] | Freq: Three times a day (TID) | SUBCUTANEOUS | Status: DC
  Administered 2013-10-29: 11 [IU] via SUBCUTANEOUS
  Administered 2013-10-29: 15 [IU] via SUBCUTANEOUS
  Administered 2013-10-31: 3 [IU] via SUBCUTANEOUS

## 2013-10-29 MED ORDER — DEXTROSE 5 % IV SOLN
INTRAVENOUS | Status: DC
  Administered 2013-10-29 – 2013-10-31 (×5): via INTRAVENOUS

## 2013-10-29 NOTE — Progress Notes (Signed)
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 - has remained afebrile.  AKI (acute kidney injury) - Previous cr normal 2009. - Cr has stabilize. Start D5w. - pt has not been drink enough free water. - positive overnight.  Hypernatremia: - Still worsening will start D5w. - b-met in am.  Type I diabetes mellitus with complication, uncontrolled - improving with insulin titration. - his BG will worsen with D5w. Start resistance SSI.   Essential hypertension - high cont hydralazine will need ACE-I as an outpatient.  Anemia of chronic disease    Code Status: Full  Family Communication: Pt at bedside  Disposition Plan: Admit for further evaluation     Consultants:  Orthopedic   Procedures: Left finger x-ray : Marked soft tissue swelling surrounding the left index finger suggestive of cellulitis. Additionally, there is bony destruction of the left second distal phalanx suggestive of osteomyelitis  Antibiotics:  vanc and zosyn 10.17.2015  HPI/Subjective: No complalins  Objective: Filed Vitals:   10/28/13 1607 10/28/13 2011 10/28/13 2144 10/29/13 0414  BP: 162/93 167/96 136/89 137/89  Pulse: 75 62  96  Temp: 97.9 F (36.6 C) 98.8 F (37.1 C)  98.3 F (36.8 C)  TempSrc: Oral Oral  Oral  Resp:  18  18  Height:      Weight:    69.446 kg (153 lb 1.6 oz)  SpO2: 100% 100%  99%    Intake/Output Summary (Last 24 hours) at 10/29/13 0940 Last data filed at 10/29/13 0842  Gross per 24 hour  Intake    960 ml  Output    352 ml  Net    608 ml   Filed Weights   10/27/13 0520 10/28/13 0648 10/29/13 0414  Weight: 68.584 kg (151 lb 3.2 oz) 69.264 kg (152 lb 11.2 oz) 69.446 kg (153 lb 1.6 oz)    Exam:  General: Alert, awake, oriented x3, in no acute distress.    HEENT: No bruits, no goiter.  Heart: Regular rate and rhythm. Lungs: Good air movement, clear   Data Reviewed: Basic Metabolic Panel:  Recent Labs Lab 10/25/13 2250 10/26/13 0418 10/27/13 1030 10/28/13 0500 10/29/13 0542  NA 129* 142 143 150* 152*  K 5.2 4.2 5.2 3.8 4.0  CL 93* 107 107 119* 115*  CO2 '19 20 21 20 23  ' GLUCOSE 588* 174* 425* 218* 119*  BUN 42* 35* 36* 27* 31*  CREATININE 2.42* 2.10* 2.03* 1.68* 1.76*  CALCIUM 10.1 9.6 9.5 7.8* 9.9   Liver Function Tests:  Recent Labs Lab 10/25/13 2250  AST 15  ALT 17  ALKPHOS 132*  BILITOT 0.3  PROT 9.3*  ALBUMIN 3.2*   No results found for this basename: LIPASE, AMYLASE,  in the last 168 hours No results found for this basename: AMMONIA,  in the last 168 hours CBC:  Recent Labs Lab 10/25/13 2250 10/26/13 0418  WBC 15.2* 14.8*  NEUTROABS 11.7*  --   HGB 11.2* 10.3*  HCT 32.7* 30.0*  MCV 82.0 81.5  PLT 375 368   Cardiac Enzymes: No results found for this basename: CKTOTAL, CKMB, CKMBINDEX, TROPONINI,  in the last 168 hours BNP (last 3 results) No results found for this basename: PROBNP,  in the last 8760 hours CBG:  Recent Labs Lab 10/28/13 0654 10/28/13 1139 10/28/13 1610 10/28/13  2051 10/29/13 0606  GLUCAP 256* 318* 360* 266* 139*    Recent Results (from the past 240 hour(s))  SURGICAL PCR SCREEN     Status: None   Collection Time    10/26/13  9:16 AM      Result Value Ref Range Status   MRSA, PCR NEGATIVE  NEGATIVE Final   Staphylococcus aureus NEGATIVE  NEGATIVE Final   Comment:            The Xpert SA Assay (FDA     approved for NASAL specimens     in patients over 70 years of age),     is one component of     a comprehensive surveillance     program.  Test performance has     been validated by [employer] for patients greater     than or equal to 2 year old.     It is not intended     to diagnose infection nor to     guide or monitor treatment.  ANAEROBIC CULTURE      Status: None   Collection Time    10/26/13  4:10 PM      Result Value Ref Range Status   Specimen Description FINGER LEFT   Final   Special Requests PATIENT ON FOLLOWING ZOSYN VANC   Final   Gram Stain     Final   Value: ABUNDANT WBC PRESENT,BOTH PMN AND MONONUCLEAR     MODERATE SQUAMOUS EPITHELIAL CELLS PRESENT     ABUNDANT GRAM POSITIVE COCCI IN PAIRS AND CHAINS     MODERATE YEAST     Performed at [facility]   Culture     Final   Value: NO ANAEROBES ISOLATED; CULTURE IN PROGRESS FOR 5 DAYS     Performed at [facility]   Report Status PENDING   Incomplete  CULTURE, ROUTINE-ABSCESS     Status: None   Collection Time    10/26/13  4:10 PM      Result Value Ref Range Status   Specimen Description FINGER LEFT   Final   Special Requests PATIENT ON FOLLOWING ZOSYN VANC   Final   Gram Stain     Final   Value: MODERATE WBC PRESENT,BOTH PMN AND MONONUCLEAR     RARE SQUAMOUS EPITHELIAL CELLS PRESENT     ABUNDANT GRAM POSITIVE COCCI IN PAIRS AND CHAINS     MODERATE YEAST     Performed at [facility]   Culture     Final   Value: MODERATE GROUP B STREP(S.AGALACTIAE)ISOLATED     Note: TESTING AGAINST S. AGALACTIAE NOT ROUTINELY PERFORMED DUE TO PREDICTABILITY OF AMP/PEN/VAN SUSCEPTIBILITY.     Performed at [facility]   Report Status PENDING   Incomplete     Studies: No results found.  Scheduled Meds: . ciprofloxacin  500 mg Oral BID  . doxycycline  100 mg Oral Q12H  . enoxaparin (LOVENOX) injection  40 mg Subcutaneous Daily  . insulin aspart  0-5 Units Subcutaneous QHS  . insulin aspart  0-9 Units Subcutaneous TID WC  . insulin glargine  30 Units Subcutaneous BID  . sodium chloride  3 mL Intravenous Q12H   Continuous Infusions: . dextrose       Charlynne Cousins  [facility] Pager [phone]. If 8PM-8AM, please contact night-coverage at [email or web address], password [facility] 10/29/2013, 9:40 AM  LOS: 4 days

## 2013-10-29 NOTE — Progress Notes (Signed)
Report Given to receiving RN. Patient in bed resting. No verbal complaints.

## 2013-10-30 ENCOUNTER — Encounter (HOSPITAL_COMMUNITY)
Admission: EM | Disposition: A | Discharge status: Home Health | Payer: Self-pay | Source: Home / Self Care | Attending: Internal Medicine

## 2013-10-30 ENCOUNTER — Encounter (HOSPITAL_COMMUNITY): Payer: Self-pay | Admitting: Certified Registered Nurse Anesthetist

## 2013-10-30 ENCOUNTER — Encounter (HOSPITAL_COMMUNITY): Payer: MEDICAID | Admitting: Certified Registered Nurse Anesthetist

## 2013-10-30 ENCOUNTER — Inpatient Hospital Stay (HOSPITAL_COMMUNITY): Payer: MEDICAID | Admitting: Certified Registered Nurse Anesthetist

## 2013-10-30 DIAGNOSIS — E119 Type 2 diabetes mellitus without complications: Secondary | ICD-10-CM

## 2013-10-30 DIAGNOSIS — Z794 Long term (current) use of insulin: Secondary | ICD-10-CM

## 2013-10-30 HISTORY — PX: I&D EXTREMITY: SHX5045

## 2013-10-30 LAB — GLUCOSE, CAPILLARY
GLUCOSE-CAPILLARY: 134 mg/dL — AB (ref 70–99)
GLUCOSE-CAPILLARY: 115 mg/dL — AB (ref 70–99)
GLUCOSE-CAPILLARY: 89 mg/dL (ref 70–99)
GLUCOSE-CAPILLARY: 112 mg/dL — AB (ref 70–99)
Glucose-Capillary: 88 mg/dL (ref 70–99)
Glucose-Capillary: 82 mg/dL (ref 70–99)
Glucose-Capillary: 66 mg/dL — ABNORMAL LOW (ref 70–99)
Glucose-Capillary: 65 mg/dL — ABNORMAL LOW (ref 70–99)
Glucose-Capillary: 77 mg/dL (ref 70–99)
Glucose-Capillary: 134 mg/dL — ABNORMAL HIGH (ref 70–99)

## 2013-10-30 LAB — BASIC METABOLIC PANEL
Anion gap: 14 (ref 5–15)
BUN: 24 mg/dL — AB (ref 6–23)
CALCIUM: 8.9 mg/dL (ref 8.4–10.5)
CO2: 23 meq/L (ref 19–32)
Chloride: 108 mEq/L (ref 96–112)
Creatinine, Ser: 1.65 mg/dL — ABNORMAL HIGH (ref 0.50–1.35)
GFR calc Af Amer: 54 mL/min — ABNORMAL LOW (ref >90)
GFR, EST NON AFRICAN AMERICAN: 46 mL/min — AB (ref >90)
GLUCOSE: 81 mg/dL (ref 70–99)
Potassium: 3.8 mEq/L (ref 3.7–5.3)
Sodium: 145 mEq/L (ref 137–147)

## 2013-10-30 SURGERY — IRRIGATION AND DEBRIDEMENT EXTREMITY
Anesthesia: General | Site: Finger | Laterality: Left

## 2013-10-30 MED ORDER — LACTATED RINGERS IV SOLN
INTRAVENOUS | Status: DC | PRN
  Administered 2013-10-30: 14:00:00 via INTRAVENOUS

## 2013-10-30 MED ORDER — HYDROMORPHONE HCL 1 MG/ML IJ SOLN
0.2500 mg | INTRAMUSCULAR | Status: DC | PRN
  Administered 2013-10-30: 0.5 mg via INTRAVENOUS

## 2013-10-30 MED ORDER — DOUBLE ANTIBIOTIC 500-10000 UNIT/GM EX OINT
TOPICAL_OINTMENT | CUTANEOUS | Status: AC
  Filled 2013-10-30: qty 1

## 2013-10-30 MED ORDER — HYDROCODONE-ACETAMINOPHEN 5-325 MG PO TABS
1.0000 | ORAL_TABLET | Freq: Four times a day (QID) | ORAL | Status: DC | PRN
  Administered 2013-10-30 – 2013-11-01 (×3): 2 via ORAL
  Filled 2013-10-30 (×3): qty 2

## 2013-10-30 MED ORDER — DEXTROSE 50 % IV SOLN
INTRAVENOUS | Status: AC
  Administered 2013-10-30: 25 mL
  Filled 2013-10-30: qty 50

## 2013-10-30 MED ORDER — OXYCODONE HCL 5 MG PO TABS: 5.0000 mg | ORAL_TABLET | Freq: Once | ORAL | Status: DC | PRN

## 2013-10-30 MED ORDER — BUPIVACAINE HCL (PF) 0.25 % IJ SOLN
INTRAMUSCULAR | Status: AC
  Filled 2013-10-30: qty 30

## 2013-10-30 MED ORDER — DEXTROSE 50 % IV SOLN
25.0000 mL | Freq: Once | INTRAVENOUS | Status: AC
  Administered 2013-10-30: 25 mL via INTRAVENOUS

## 2013-10-30 MED ORDER — PROPOFOL 10 MG/ML IV BOLUS
INTRAVENOUS | Status: AC
  Filled 2013-10-30: qty 20

## 2013-10-30 MED ORDER — BACITRACIN ZINC 500 UNIT/GM EX OINT
TOPICAL_OINTMENT | CUTANEOUS | Status: AC
  Filled 2013-10-30: qty 15

## 2013-10-30 MED ORDER — BUPIVACAINE HCL (PF) 0.25 % IJ SOLN
INTRAMUSCULAR | Status: DC | PRN
  Administered 2013-10-30: 16 mL

## 2013-10-30 MED ORDER — FENTANYL CITRATE 0.05 MG/ML IJ SOLN
INTRAMUSCULAR | Status: AC
  Filled 2013-10-30: qty 5

## 2013-10-30 MED ORDER — PHENYLEPHRINE 40 MCG/ML (10ML) SYRINGE FOR IV PUSH (FOR BLOOD PRESSURE SUPPORT)
PREFILLED_SYRINGE | INTRAVENOUS | Status: AC
  Filled 2013-10-30: qty 10

## 2013-10-30 MED ORDER — HYDRALAZINE HCL 20 MG/ML IJ SOLN
5.0000 mg | Freq: Once | INTRAMUSCULAR | Status: AC
  Administered 2013-10-30: 5 mg via INTRAVENOUS

## 2013-10-30 MED ORDER — MIDAZOLAM HCL 2 MG/2ML IJ SOLN
INTRAMUSCULAR | Status: AC
  Filled 2013-10-30: qty 2

## 2013-10-30 MED ORDER — PROPOFOL 10 MG/ML IV BOLUS
INTRAVENOUS | Status: DC | PRN
  Administered 2013-10-30: 150 mg via INTRAVENOUS

## 2013-10-30 MED ORDER — LACTATED RINGERS IV SOLN
INTRAVENOUS | Status: DC
  Administered 2013-10-30: 12:00:00 via INTRAVENOUS

## 2013-10-30 MED ORDER — PHENYLEPHRINE HCL 10 MG/ML IJ SOLN
INTRAMUSCULAR | Status: DC | PRN
  Administered 2013-10-30 (×2): 80 ug via INTRAVENOUS

## 2013-10-30 MED ORDER — LIDOCAINE HCL 1 % IJ SOLN
INTRAMUSCULAR | Status: DC | PRN
  Administered 2013-10-30: 40 mg via INTRADERMAL

## 2013-10-30 MED ORDER — HYDROMORPHONE HCL 1 MG/ML IJ SOLN
INTRAMUSCULAR | Status: AC
  Filled 2013-10-30: qty 1

## 2013-10-30 MED ORDER — ONDANSETRON HCL 4 MG/2ML IJ SOLN
INTRAMUSCULAR | Status: DC | PRN
  Administered 2013-10-30: 4 mg via INTRAVENOUS

## 2013-10-30 MED ORDER — ONDANSETRON HCL 4 MG/2ML IJ SOLN
INTRAMUSCULAR | Status: AC
  Filled 2013-10-30: qty 2

## 2013-10-30 MED ORDER — BACITRACIN ZINC 500 UNIT/GM EX OINT
TOPICAL_OINTMENT | CUTANEOUS | Status: DC | PRN
  Administered 2013-10-30: 1 via TOPICAL

## 2013-10-30 MED ORDER — ARTIFICIAL TEARS OP OINT
TOPICAL_OINTMENT | OPHTHALMIC | Status: DC | PRN
  Administered 2013-10-30: 1 via OPHTHALMIC

## 2013-10-30 MED ORDER — FENTANYL CITRATE 0.05 MG/ML IJ SOLN
INTRAMUSCULAR | Status: DC | PRN
  Administered 2013-10-30 (×4): 25 ug via INTRAVENOUS
  Administered 2013-10-30: 50 ug via INTRAVENOUS

## 2013-10-30 MED ORDER — ARTIFICIAL TEARS OP OINT
TOPICAL_OINTMENT | OPHTHALMIC | Status: AC
  Filled 2013-10-30: qty 3.5

## 2013-10-30 MED ORDER — OXYCODONE HCL 5 MG/5ML PO SOLN: 5.0000 mg | Freq: Once | ORAL | Status: DC | PRN

## 2013-10-30 MED ORDER — LIDOCAINE HCL (CARDIAC) 20 MG/ML IV SOLN
INTRAVENOUS | Status: AC
  Filled 2013-10-30: qty 5

## 2013-10-30 MED ORDER — HYDRALAZINE HCL 20 MG/ML IJ SOLN
INTRAMUSCULAR | Status: AC
Start: 2013-10-30 — End: 2013-10-31
  Filled 2013-10-30: qty 1

## 2013-10-30 MED ORDER — SODIUM CHLORIDE 0.9 % IR SOLN
Status: DC | PRN
  Administered 2013-10-30: 1000 mL

## 2013-10-30 SURGICAL SUPPLY — 46 items
BAG DECANTER FOR FLEXI CONT (MISCELLANEOUS) ×3 IMPLANT
BANDAGE ELASTIC 3 VELCRO ST LF (GAUZE/BANDAGES/DRESSINGS) IMPLANT
BANDAGE ELASTIC 4 VELCRO ST LF (GAUZE/BANDAGES/DRESSINGS) IMPLANT
BNDG ELASTIC 2 VLCR STRL LF (GAUZE/BANDAGES/DRESSINGS) ×3 IMPLANT
BNDG GAUZE ELAST 4 BULKY (GAUZE/BANDAGES/DRESSINGS) ×3 IMPLANT
CORDS BIPOLAR (ELECTRODE) IMPLANT
CUFF TOURNIQUET SINGLE 18IN (TOURNIQUET CUFF) IMPLANT
DRAPE SURG 17X23 STRL (DRAPES) ×3 IMPLANT
ELECT REM PT RETURN 9FT ADLT (ELECTROSURGICAL)
ELECTRODE REM PT RTRN 9FT ADLT (ELECTROSURGICAL) IMPLANT
GAUZE PACKING IODOFORM 1/4X5 (PACKING) IMPLANT
GAUZE SPONGE 4X4 12PLY STRL (GAUZE/BANDAGES/DRESSINGS) ×3 IMPLANT
GAUZE XEROFORM 1X8 LF (GAUZE/BANDAGES/DRESSINGS) ×3 IMPLANT
GLOVE BIOGEL M STRL SZ7.5 (GLOVE) ×3 IMPLANT
GLOVE BIOGEL PI IND STRL 6.5 (GLOVE) ×2 IMPLANT
GLOVE BIOGEL PI IND STRL 7.0 (GLOVE) ×1 IMPLANT
GLOVE BIOGEL PI INDICATOR 6.5 (GLOVE) ×4
GLOVE BIOGEL PI INDICATOR 7.0 (GLOVE) ×2
GLOVE ECLIPSE 6.5 STRL STRAW (GLOVE) ×3 IMPLANT
GOWN STRL REUS W/ TWL LRG LVL3 (GOWN DISPOSABLE) ×2 IMPLANT
GOWN STRL REUS W/TWL LRG LVL3 (GOWN DISPOSABLE) ×4
HANDPIECE INTERPULSE COAX TIP (DISPOSABLE)
KIT BASIN OR (CUSTOM PROCEDURE TRAY) ×3 IMPLANT
KIT ROOM TURNOVER OR (KITS) ×3 IMPLANT
MANIFOLD NEPTUNE II (INSTRUMENTS) ×3 IMPLANT
NEEDLE HYPO 25GX1X1/2 BEV (NEEDLE) ×3 IMPLANT
NS IRRIG 1000ML POUR BTL (IV SOLUTION) ×3 IMPLANT
PACK ORTHO EXTREMITY (CUSTOM PROCEDURE TRAY) ×3 IMPLANT
PAD ARMBOARD 7.5X6 YLW CONV (MISCELLANEOUS) ×6 IMPLANT
PAD CAST 4YDX4 CTTN HI CHSV (CAST SUPPLIES) IMPLANT
PADDING CAST COTTON 4X4 STRL (CAST SUPPLIES)
SET HNDPC FAN SPRY TIP SCT (DISPOSABLE) IMPLANT
SOAP 2 % CHG 4 OZ (WOUND CARE) ×3 IMPLANT
SPONGE GAUZE 4X4 12PLY STER LF (GAUZE/BANDAGES/DRESSINGS) ×3 IMPLANT
SPONGE LAP 18X18 X RAY DECT (DISPOSABLE) IMPLANT
SPONGE LAP 4X18 X RAY DECT (DISPOSABLE) ×3 IMPLANT
SUT PROLENE 3 0 PS 2 (SUTURE) ×6 IMPLANT
SYR BULB IRRIGATION 50ML (SYRINGE) ×3 IMPLANT
SYR CONTROL 10ML LL (SYRINGE) ×3 IMPLANT
TOWEL OR 17X24 6PK STRL BLUE (TOWEL DISPOSABLE) ×3 IMPLANT
TOWEL OR 17X26 10 PK STRL BLUE (TOWEL DISPOSABLE) ×3 IMPLANT
TUBE ANAEROBIC SPECIMEN COL (MISCELLANEOUS) IMPLANT
TUBE CONNECTING 12'X1/4 (SUCTIONS) ×1
TUBE CONNECTING 12X1/4 (SUCTIONS) ×2 IMPLANT
WATER STERILE IRR 1000ML POUR (IV SOLUTION) ×3 IMPLANT
YANKAUER SUCT BULB TIP NO VENT (SUCTIONS) ×3 IMPLANT

## 2013-10-30 NOTE — Progress Notes (Signed)
CSW left message for Amado Coe, Financial Counselor [phone] to follow up with patient for possible financial assistance and Medicaid application.  Lorie Phenix. Pauline Good, [place]

## 2013-10-30 NOTE — Progress Notes (Signed)
S: Pt has now signed consent after explaining procedure again.  O:Blood pressure 122/80, pulse 79, temperature 97.7 F (36.5 C), temperature source Oral, resp. rate 18, height 6\' 3"  (1.905 m), weight 71.124 kg (156 lb 12.8 oz), SpO2 100.00%.    A:severe infection of LIF, s/p I&D, partial IF amputation   P:will proceed with wound wash out, potential wound closure

## 2013-10-30 NOTE — Progress Notes (Signed)
At 2053, Timothy Madden's CBG was 106.  I paged the hospitalist about whether or not I should administer Lantus 30units.  I was instructed to still give the medication.  I gave patient a snack before he fell asleep to not drop his CBG level.

## 2013-10-30 NOTE — Progress Notes (Addendum)
Report Given to receiving RN. Patient in bed sleeping. No signs of distress noted.

## 2013-10-30 NOTE — Anesthesia Postprocedure Evaluation (Signed)
  Anesthesia Post-op Note  Patient: Timothy Madden  Procedure(s) Performed: Procedure(s): IRRIGATION AND DEBRIDEMENT AND REVISION AMPUTATION OF LEFT INDEX FINGER (Left)  Patient Location: PACU  Anesthesia Type:General  Level of Consciousness: awake and alert   Airway and Oxygen Therapy: Patient Spontanous Breathing  Post-op Pain: mild  Post-op Assessment: Post-op Vital signs reviewed, Patient's Cardiovascular Status Stable and Respiratory Function Stable  Post-op Vital Signs: Reviewed  Filed Vitals:   10/30/13 1515  BP: 154/98  Pulse: 73  Temp:   Resp: 9    Complications: No apparent anesthesia complications

## 2013-10-30 NOTE — Progress Notes (Signed)
[facility] PROGRESS NOTE Hospital Course: 53 y/o male with IDDM and non-compliance admitted for osteomyelitis/ cellulitis of left finger.    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 - has remained afebrile.  AKI - CKD 3 - Previous cr normal 2009. - Cr has stabilize with IVF and is currently in 1.6-1.7 range  Hypernatremia: - improved with D5w.  Type I diabetes mellitus with complication, uncontrolled - A1c > 20!! - will order nutritionist and diabetes coordinator and ask for further teaching -sugars improving with insulin titration and resistance SSI.   Essential hypertension - BP stable- will see if we can add an ACE later as long as renal function remains stable  Anemia of chronic disease    Code Status: Full  Family Communication: Pt at bedside  Disposition Plan: Admit for further evaluation     Consultants:  Orthopedic   Procedures: Left finger x-ray : Marked soft tissue swelling surrounding the left index finger suggestive of cellulitis. Additionally, there is bony destruction of the left second distal phalanx suggestive of osteomyelitis  Antibiotics:  vanc and zosyn 10/26/2013- 10/27/13  Cipro and Doxy 10/ 19/ 15 >>   Objective: Filed Vitals:   10/29/13 2021 10/30/13 0545 10/30/13 1028 10/30/13 1137  BP: 156/95 126/82 124/77 122/80  Pulse: 80 76 76 79  Temp: 98.1 F (36.7 C) 98.1 F (36.7 C)  97.7 F (36.5 C)  TempSrc: Oral Oral  Oral  Resp: 18 18  18   Height:      Weight:  71.124 kg (156 lb 12.8 oz)    SpO2: 100% 100%  100%    Intake/Output Summary (Last 24 hours) at 10/30/13 1445 Last data filed at 10/30/13 1428  Gross per 24 hour  Intake   2850 ml  Output    632 ml  Net   2218 ml    Filed Weights   10/28/13 0648 10/29/13 0414 10/30/13 0545  Weight: 69.264 kg (152 lb 11.2 oz) 69.446 kg (153 lb 1.6 oz) 71.124 kg (156 lb 12.8 oz)    Exam:  General: Alert, awake, oriented x3, in no acute distress.  HEENT: No bruits, no goiter.  Heart: Regular rate and rhythm. Lungs: Good air movement, clear   Data Reviewed: Basic Metabolic Panel:  Recent Labs Lab 10/26/13 0418 10/27/13 1030 10/28/13 0500 10/29/13 0542 10/30/13 0741  NA 142 143 150* 152* 145  K 4.2 5.2 3.8 4.0 3.8  CL 107 107 119* 115* 108  CO2 20 21 20 23 23   GLUCOSE 174* 425* 218* 119* 81  BUN 35* 36* 27* 31* 24*  CREATININE 2.10* 2.03* 1.68* 1.76* 1.65*  CALCIUM 9.6 9.5 7.8* 9.9 8.9   Liver Function Tests:  Recent Labs Lab 10/25/13 2250  AST 15  ALT 17  ALKPHOS 132*  BILITOT 0.3  PROT 9.3*  ALBUMIN 3.2*   No results found for this basename: LIPASE, AMYLASE,  in the last 168 hours No results found for this basename: AMMONIA,  in the last 168 hours CBC:  Recent Labs Lab 10/25/13 2250 10/26/13 0418  WBC 15.2* 14.8*  NEUTROABS 11.7*  --   HGB 11.2* 10.3*  HCT 32.7* 30.0*  MCV 82.0 81.5  PLT 375 368   Cardiac Enzymes: No results found for this basename: CKTOTAL,  CKMB, CKMBINDEX, TROPONINI,  in the last 168 hours BNP (last 3 results) No results found for this basename: PROBNP,  in the last 8760 hours CBG:  Recent Labs Lab 10/29/13 2053 10/30/13 0551 10/30/13 1056 10/30/13 1203 10/30/13 1236  GLUCAP 106* 88 82 66* 134*    Recent Results (from the past 240 hour(s))  SURGICAL PCR SCREEN     Status: None   Collection Time    10/26/13  9:16 AM      Result Value Ref Range Status   MRSA, PCR NEGATIVE  NEGATIVE Final   Staphylococcus aureus NEGATIVE  NEGATIVE Final   Comment:            The Xpert SA Assay (FDA     approved for NASAL specimens     in patients over 78 years of age),     is one component of     a comprehensive surveillance     program.  Test performance  has     been validated by [employer] for patients greater     than or equal to 34 year old.     It is not intended     to diagnose infection nor to     guide or monitor treatment.  ANAEROBIC CULTURE     Status: None   Collection Time    10/26/13  4:10 PM      Result Value Ref Range Status   Specimen Description FINGER LEFT   Final   Special Requests PATIENT ON FOLLOWING ZOSYN VANC   Final   Gram Stain     Final   Value: ABUNDANT WBC PRESENT,BOTH PMN AND MONONUCLEAR     MODERATE SQUAMOUS EPITHELIAL CELLS PRESENT     ABUNDANT GRAM POSITIVE COCCI IN PAIRS AND CHAINS     MODERATE YEAST     Performed at [facility]   Culture     Final   Value: NO ANAEROBES ISOLATED; CULTURE IN PROGRESS FOR 5 DAYS     Performed at [facility]   Report Status PENDING   Incomplete  CULTURE, ROUTINE-ABSCESS     Status: None   Collection Time    10/26/13  4:10 PM      Result Value Ref Range Status   Specimen Description FINGER LEFT   Final   Special Requests PATIENT ON FOLLOWING ZOSYN VANC   Final   Gram Stain     Final   Value: MODERATE WBC PRESENT,BOTH PMN AND MONONUCLEAR     RARE SQUAMOUS EPITHELIAL CELLS PRESENT     ABUNDANT GRAM POSITIVE COCCI IN PAIRS AND CHAINS     MODERATE YEAST     Performed at [facility]   Culture     Final   Value: MODERATE GROUP B STREP(S.AGALACTIAE)ISOLATED     Note: TESTING AGAINST S. AGALACTIAE NOT ROUTINELY PERFORMED DUE TO PREDICTABILITY OF AMP/PEN/VAN SUSCEPTIBILITY.     Performed at [facility]   Report Status 10/29/2013 FINAL   Final     Studies: No results found.  Scheduled Meds: . [facility] HOLD] ciprofloxacin  500 mg Oral BID  . dextrose      . [facility] HOLD] doxycycline  100 mg Oral Q12H  . [[facility] HOLD] enoxaparin (LOVENOX) injection  40 mg Subcutaneous Daily  . [[facility] HOLD] insulin aspart  0-20 Units Subcutaneous TID WC  . [[facility] HOLD] insulin aspart  0-5 Units Subcutaneous QHS  . [[facility] HOLD] insulin aspart  6 Units  Subcutaneous TID WC  . [[facility] HOLD] insulin glargine  30 Units Subcutaneous BID  . [facility] HOLD] sodium chloride  3 mL Intravenous Q12H   Continuous Infusions: . dextrose 100 mL/hr at 10/30/13 0553  . lactated ringers 10 mL/hr at 10/30/13 1214     [facility]  [facility] If 7PM-7AM, please contact night-coverage at [email or web address], password [facility] 10/30/2013, 2:45 PM  LOS: 5 days

## 2013-10-30 NOTE — Op Note (Signed)
10/25/2013 - 10/30/2013  2:28 PM  PATIENT:  Timothy Madden  53 y.o. male  PRE-OPERATIVE DIAGNOSIS:  INFECTION LEFT INDEX FINGER LEFT  HAND, s/p I&D, partial IF apmputation  POST-OPERATIVE DIAGNOSIS:  INFECTION LEFT INDEX FINGER LEFT  HAND, s/p I&D, partial IF apmputation  PROCEDURE:  Procedure(s): IRRIGATION AND DEBRIDEMENT AND REVISION AMPUTATION OF LEFT INDEX FINGER  SURGEON:  Surgeon(s): Dayna Barker, MD  ANESTHESIA:   general  SPECIMEN:  No Specimen  FINDINGS:  Clean amputation site, no residual obvious infection   PATIENT DISPOSITION:  PACU - hemodynamically stable.

## 2013-10-30 NOTE — Progress Notes (Signed)
UR complete.  Dalbert Stillings RN, MSN 

## 2013-10-30 NOTE — Transfer of Care (Signed)
Immediate Anesthesia Transfer of Care Note  Patient: Timothy Madden  Procedure(s) Performed: Procedure(s): IRRIGATION AND DEBRIDEMENT AND REVISION AMPUTATION OF LEFT INDEX FINGER (Left)  Patient Location: PACU  Anesthesia Type:General  Level of Consciousness: awake, alert , oriented and patient cooperative  Airway & Oxygen Therapy: Patient Spontanous Breathing and Patient connected to nasal cannula oxygen  Post-op Assessment: Report given to PACU RN and Post -op Vital signs reviewed and stable  Post vital signs: Reviewed and stable  Complications: No apparent anesthesia complications

## 2013-10-30 NOTE — Anesthesia Procedure Notes (Signed)
Procedure Name: LMA Insertion Date/Time: 10/30/2013 1:40 PM Performed by: Ned Grace Pre-anesthesia Checklist: Patient identified, Timeout performed, Emergency Drugs available, Suction available and Patient being monitored Patient Re-evaluated:Patient Re-evaluated prior to inductionOxygen Delivery Method: Circle system utilized Preoxygenation: Pre-oxygenation with 100% oxygen Intubation Type: IV induction Ventilation: Mask ventilation without difficulty LMA: LMA inserted LMA Size: 5.0 Number of attempts: 1 Placement Confirmation: breath sounds checked- equal and bilateral and positive ETCO2 Tube secured with: Tape Dental Injury: Teeth and Oropharynx as per pre-operative assessment

## 2013-10-30 NOTE — Anesthesia Preprocedure Evaluation (Addendum)
Anesthesia Evaluation  Patient identified by MRN, date of birth, ID band Patient awake    Reviewed: Allergy & Precautions, H&P , NPO status , Patient's Chart, lab work & pertinent test results  Airway Mallampati: II TM Distance: >3 FB Neck ROM: Full    Dental no notable dental hx. (+) Poor Dentition, Dental Advisory Given   Pulmonary Current Smoker,  breath sounds clear to auscultation  Pulmonary exam normal       Cardiovascular negative cardio ROS  Rhythm:Regular Rate:Normal     Neuro/Psych negative neurological ROS  negative psych ROS   GI/Hepatic negative GI ROS, Neg liver ROS,   Endo/Other  diabetes, Poorly Controlled, Insulin Dependent  Renal/GU Renal disease  negative genitourinary   Musculoskeletal   Abdominal   Peds  Hematology negative hematology ROS (+) anemia ,   Anesthesia Other Findings   Reproductive/Obstetrics negative OB ROS                          Anesthesia Physical Anesthesia Plan  ASA: III  Anesthesia Plan: General   Post-op Pain Management:    Induction: Intravenous  Airway Management Planned: LMA  Additional Equipment:   Intra-op Plan:   Post-operative Plan: Extubation in OR  Informed Consent: I have reviewed the patients History and Physical, chart, labs and discussed the procedure including the risks, benefits and alternatives for the proposed anesthesia with the patient or authorized representative who has indicated his/her understanding and acceptance.   Dental advisory given  Plan Discussed with: CRNA  Anesthesia Plan Comments:         Anesthesia Quick Evaluation

## 2013-10-31 LAB — CBC
HCT: 30.7 % — ABNORMAL LOW (ref 39.0–52.0)
HEMOGLOBIN: 10 g/dL — AB (ref 13.0–17.0)
MCH: 27.5 pg (ref 26.0–34.0)
MCHC: 32.6 g/dL (ref 30.0–36.0)
MCV: 84.6 fL (ref 78.0–100.0)
Platelets: 427 10*3/uL — ABNORMAL HIGH (ref 150–400)
RBC: 3.63 MIL/uL — ABNORMAL LOW (ref 4.22–5.81)
RDW: 13.6 % (ref 11.5–15.5)
WBC: 11.7 10*3/uL — ABNORMAL HIGH (ref 4.0–10.5)

## 2013-10-31 LAB — ANAEROBIC CULTURE

## 2013-10-31 LAB — BASIC METABOLIC PANEL
ANION GAP: 12 (ref 5–15)
BUN: 25 mg/dL — ABNORMAL HIGH (ref 6–23)
CHLORIDE: 104 meq/L (ref 96–112)
CO2: 22 mEq/L (ref 19–32)
CREATININE: 1.95 mg/dL — AB (ref 0.50–1.35)
Calcium: 8.5 mg/dL (ref 8.4–10.5)
GFR calc non Af Amer: 38 mL/min — ABNORMAL LOW (ref >90)
GFR, EST AFRICAN AMERICAN: 44 mL/min — AB (ref >90)
Glucose, Bld: 75 mg/dL (ref 70–99)
Potassium: 4.1 mEq/L (ref 3.7–5.3)
SODIUM: 138 meq/L (ref 137–147)

## 2013-10-31 LAB — GLUCOSE, CAPILLARY
GLUCOSE-CAPILLARY: 81 mg/dL (ref 70–99)
GLUCOSE-CAPILLARY: 50 mg/dL — AB (ref 70–99)
Glucose-Capillary: 140 mg/dL — ABNORMAL HIGH (ref 70–99)
Glucose-Capillary: 73 mg/dL (ref 70–99)
Glucose-Capillary: 130 mg/dL — ABNORMAL HIGH (ref 70–99)

## 2013-10-31 MED ORDER — GLUCOSE 40 % PO GEL
ORAL | Status: AC
  Administered 2013-10-31: 37.5 g
  Filled 2013-10-31: qty 1

## 2013-10-31 NOTE — Progress Notes (Signed)
Inpatient Diabetes Program Recommendations  AACE/ADA: New Consensus Statement on Inpatient Glycemic Control (2013)  Target Ranges:  Prepandial:   less than 140 mg/dL      Peak postprandial:   less than 180 mg/dL (1-2 hours)      Critically ill patients:  140 - 180 mg/dL   Reason for Visit: Blood sugar control  Results for Timothy, Madden (MRN [id number]) as of 10/31/2013 12:55  Ref. Range 10/30/2013 14:44 10/30/2013 15:03 10/30/2013 16:06 10/30/2013 16:25 10/30/2013 16:41 10/30/2013 21:35 10/31/2013 06:52 10/31/2013 11:19  Glucose-Capillary Latest Range: 70-99 mg/dL 65 (L) 115 (H) 77 89 112 (H) 134 (H) 81 140 (H)   Lengthy discussion with pt and family regarding importance of controlling blood sugars at home. Discussed hypoglycemia, glucose monitoring and going home on insulin. Pt willing to go to [facility] after discharge for management of diabetes. Will order insulin starter kit and RN to begin teaching insulin administration.  Recommendations: Decrease Lantus to 25-30 units QHS.  Will need prescription for meter, lancets and strips at discharge. Pt will need "simplified" insulin regimen for home. PCP at [facility] will follow closely and fine-tune insulin as needed.  Will continue to follow. Thank you. Lorenda Peck, RD, LDN, CDE Inpatient Diabetes Coordinator [phone]

## 2013-10-31 NOTE — Progress Notes (Signed)
[facility] PROGRESS NOTE Hospital Course: 53 y/o male with IDDM and non-compliance admitted for osteomyelitis/ cellulitis of left finger. The patient was taking metformin at home and the last time he used insulin was over 3 months ago when he was receiving it in jail.   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 -Status post I&D on 10/21 and returned to the OR for closure of wound on 10/22 - has remained afebrile.  AKI - CKD 3 - Previous cr normal 2009. - Cr has stabilize with IVF and is currently in 1.6-1.7 range  Hypernatremia: - improved with D5w.  Type I diabetes mellitus with complication, uncontrolled - A1c > 20 - Have ordered nutritionist and diabetes coordinator and ask for further teaching -sugars improving with insulin titration and resistance SSI. -We will prescribe Lantus and NovoLog for him which she will be able to obtain via the match program  - will start insulin teaching now    Essential hypertension - BP stable- unable to add ACE I due to poor creatinine clearance  Anemia of chronic disease    Code Status: Full  Family Communication: Pt at bedside  Disposition Plan: Home with home health    Consultants:  Orthopedic    Antibiotics:  vanc and zosyn 10/26/2013- 10/27/13  Cipro and Doxy 10/ 19/ 15 >>   Objective: Filed Vitals:   10/30/13 1615 10/30/13 1636 10/30/13 2047 10/31/13 0653  BP: 153/89 135/86 119/88 125/88  Pulse: 79  89 84  Temp: 98 F (36.7 C) 98.1 F (36.7 C) 98.8 F (37.1 C) 97.9 F (36.6 C)  TempSrc:  Oral Oral Oral  Resp: 13 16 16 17   Height:      Weight:    73.7 kg (162 lb 7.7 oz)  SpO2: 100% 100% 99% 98%    Intake/Output Summary (Last 24 hours) at 10/31/13 1346 Last  data filed at 10/31/13 0945  Gross per 24 hour  Intake   2880 ml  Output    655 ml  Net   2225 ml   Filed Weights   10/29/13 0414 10/30/13 0545 10/31/13 0653  Weight: 69.446 kg (153 lb 1.6 oz) 71.124 kg (156 lb 12.8 oz) 73.7 kg (162 lb 7.7 oz)    Exam:  General: Alert, awake, oriented x3, in no acute distress.  HEENT: No bruits, no goiter.  Heart: Regular rate and rhythm. Lungs: Good air movement, clear   Data Reviewed: Basic Metabolic Panel:  Recent Labs Lab 10/27/13 1030 10/28/13 0500 10/29/13 0542 10/30/13 0741 10/31/13 0543  NA 143 150* 152* 145 138  K 5.2 3.8 4.0 3.8 4.1  CL 107 119* 115* 108 104  CO2 21 20 23 23 22   GLUCOSE 425* 218* 119* 81 75  BUN 36* 27* 31* 24* 25*  CREATININE 2.03* 1.68* 1.76* 1.65* 1.95*  CALCIUM 9.5 7.8* 9.9 8.9 8.5   Liver Function Tests:  Recent Labs Lab 10/25/13 2250  AST 15  ALT 17  ALKPHOS 132*  BILITOT 0.3  PROT 9.3*  ALBUMIN 3.2*   No results found for this basename: LIPASE, AMYLASE,  in the last 168 hours No results found for this basename: AMMONIA,  in the last 168 hours CBC:  Recent Labs Lab 10/25/13 2250 10/26/13 0418 10/31/13 0543  WBC  15.2* 14.8* 11.7*  NEUTROABS 11.7*  --   --   HGB 11.2* 10.3* 10.0*  HCT 32.7* 30.0* 30.7*  MCV 82.0 81.5 84.6  PLT 375 368 427*   Cardiac Enzymes: No results found for this basename: CKTOTAL, CKMB, CKMBINDEX, TROPONINI,  in the last 168 hours BNP (last 3 results) No results found for this basename: PROBNP,  in the last 8760 hours CBG:  Recent Labs Lab 10/30/13 1625 10/30/13 1641 10/30/13 2135 10/31/13 0652 10/31/13 1119  GLUCAP 89 112* 134* 81 140*    Recent Results (from the past 240 hour(s))  SURGICAL PCR SCREEN     Status: None   Collection Time    10/26/13  9:16 AM      Result Value Ref Range Status   MRSA, PCR NEGATIVE  NEGATIVE Final   Staphylococcus aureus NEGATIVE  NEGATIVE Final   Comment:            The Xpert SA Assay (FDA     approved for  NASAL specimens     in patients over 88 years of age),     is one component of     a comprehensive surveillance     program.  Test performance has     been validated by [employer] for patients greater     than or equal to 42 year old.     It is not intended     to diagnose infection nor to     guide or monitor treatment.  ANAEROBIC CULTURE     Status: None   Collection Time    10/26/13  4:10 PM      Result Value Ref Range Status   Specimen Description FINGER LEFT   Final   Special Requests PATIENT ON FOLLOWING ZOSYN VANC   Final   Gram Stain     Final   Value: ABUNDANT WBC PRESENT,BOTH PMN AND MONONUCLEAR     MODERATE SQUAMOUS EPITHELIAL CELLS PRESENT     ABUNDANT GRAM POSITIVE COCCI IN PAIRS AND CHAINS     MODERATE YEAST     Performed at [facility]   Culture     Final   Value: NO ANAEROBES ISOLATED     Performed at [facility]   Report Status 10/31/2013 FINAL   Final  CULTURE, ROUTINE-ABSCESS     Status: None   Collection Time    10/26/13  4:10 PM      Result Value Ref Range Status   Specimen Description FINGER LEFT   Final   Special Requests PATIENT ON FOLLOWING ZOSYN VANC   Final   Gram Stain     Final   Value: MODERATE WBC PRESENT,BOTH PMN AND MONONUCLEAR     RARE SQUAMOUS EPITHELIAL CELLS PRESENT     ABUNDANT GRAM POSITIVE COCCI IN PAIRS AND CHAINS     MODERATE YEAST     Performed at [facility]   Culture     Final   Value: MODERATE GROUP B STREP(S.AGALACTIAE)ISOLATED     Note: TESTING AGAINST S. AGALACTIAE NOT ROUTINELY PERFORMED DUE TO PREDICTABILITY OF AMP/PEN/VAN SUSCEPTIBILITY.     Performed at [facility]   Report Status 10/29/2013 FINAL   Final     Studies: No results found.  Scheduled Meds: . ciprofloxacin  500 mg Oral BID  . doxycycline  100 mg Oral Q12H  . enoxaparin (LOVENOX) injection  40 mg Subcutaneous Daily  . insulin aspart  0-20 Units Subcutaneous  TID WC  . insulin aspart  0-5 Units Subcutaneous QHS    . insulin aspart  6 Units Subcutaneous TID WC  . insulin glargine  30 Units Subcutaneous BID  . sodium chloride  3 mL Intravenous Q12H   Continuous Infusions: . lactated ringers 10 mL/hr at 10/30/13 1214     [facility]  [facility] If 7PM-7AM, please contact night-coverage at [email or web address], password [facility] 10/31/2013, 1:46 PM  LOS: 6 days

## 2013-10-31 NOTE — Progress Notes (Signed)
When rounding on patient he was alert and very talkative. He was not making any sense in the questions and stories he was talking about. Patient did receive narcotic for pain before this. Will continue to monitor patient.

## 2013-10-31 NOTE — Progress Notes (Signed)
  RD consulted for nutrition education regarding diabetes.   Lab Results  Component Value Date   HGBA1C >20.0* 10/26/2013    RD provided "Carbohydrate Counting for People with Diabetes" handout from the Academy of Nutrition and Dietetics (AND). Discussed different food groups and their effects on blood sugar, emphasizing carbohydrate-containing foods. Provided list of carbohydrates and recommended serving sizes of common foods. Also provided "5-day Sample Menus" from AND.   Discussed importance of controlled and consistent carbohydrate intake throughout the day. Provided examples of ways to balance meals/snacks and encouraged intake of high-fiber, whole grain complex carbohydrates. Teach back method used. Reviewed patient's dietary recall. Pt usually eats 3 meals, fairly balanced in carbohydrates. Pt does admit to drinking 22 oz bottle of fruit punch regularly. He states that he will stop drinking fruit punch and limit beverages to diet soda, water, and milk.  Expect good compliance. Pt likely needs reinforcement regarding insulin coverage.   Body mass index is 20.31 kg/(m^2). Pt meets criteria for Normal Weight based on current BMI.  Current diet order is Carb Modified, patient is consuming approximately 100% of meals at this time. Labs and medications reviewed. No further nutrition interventions warranted at this time. RD contact information provided. If additional nutrition issues arise, please re-consult RD.  Pryor Ochoa RD, LDN Inpatient Clinical Dietitian Pager: [phone] After Hours Pager: [phone]

## 2013-10-31 NOTE — Op Note (Signed)
NAMERIDGELY, FAWCETT             ACCOUNT NO.:  [id number]  MEDICAL RECORD NO.:  [id number]  LOCATION:  3E10C                        FACILITY:  [facility]  PHYSICIAN:  Dennie Bible, MD    DATE OF BIRTH:  07-Sep-1960  DATE OF PROCEDURE:  10/30/2013 DATE OF DISCHARGE:                              OPERATIVE REPORT   PREOPERATIVE DIAGNOSIS:  Severe infection of the left index finger, status post I and D and partial amputation.  POSTOPERATIVE DIAGNOSIS:  Severe infection of the left index finger, status post I and D and partial amputation.  PROCEDURE:  Irrigation and debridement of the left index finger, revision amputation including neurectomies and local flap coverage of the left index finger.  SURGEON:  Dennie Bible, MD  ANESTHESIA:  General.  ESTIMATED BLOOD LOSS:  25 mL.  POSTOP CONDITION:  Stable.  INDICATIONS:  Mr. Lapitan is a 53 year old gentleman presented the other day with severe infection of his index finger.  This was debrided and partially amputated.  The wound was left open.  Dressing changes have subsequently been performed.  The wound is now ready to be closed. Consent was obtained for this procedure.  PROCEDURE IN DETAIL:  Patient was taken to the operating room, placed supine on the operating table.  General anesthesia was administered without difficulty.  The left upper extremity was prepped and draped in normal sterile fashion.  The previous amputation site was debrided.  A fishmouth type incision was fashioned.  The proximal phalanx of left index finger was transected more proximally to allow a tension-free closure.  After thorough irrigation with irrigation solution and hemostasis, a local flap was created to close the amputation site.  This was performed with multiple interrupted and vertical mattress 3-0 Prolene sutures, were nice cosmetic and nice effect.  The patient tolerated the procedure well and was taken to recovery in  stable condition.     Dennie Bible, MD    HCC/MEDQ  D:  10/30/2013  T:  10/31/2013  Job:  [id number]

## 2013-11-01 ENCOUNTER — Encounter (HOSPITAL_COMMUNITY): Payer: Self-pay | Admitting: General Surgery

## 2013-11-01 LAB — CBC
HCT: 30.6 % — ABNORMAL LOW (ref 39.0–52.0)
HEMOGLOBIN: 9.7 g/dL — AB (ref 13.0–17.0)
MCH: 27.1 pg (ref 26.0–34.0)
MCHC: 31.7 g/dL (ref 30.0–36.0)
MCV: 85.5 fL (ref 78.0–100.0)
Platelets: 409 10*3/uL — ABNORMAL HIGH (ref 150–400)
RBC: 3.58 MIL/uL — ABNORMAL LOW (ref 4.22–5.81)
RDW: 13.6 % (ref 11.5–15.5)
WBC: 9.3 10*3/uL (ref 4.0–10.5)

## 2013-11-01 LAB — GLUCOSE, CAPILLARY
GLUCOSE-CAPILLARY: 122 mg/dL — AB (ref 70–99)
GLUCOSE-CAPILLARY: 173 mg/dL — AB (ref 70–99)
Glucose-Capillary: 56 mg/dL — ABNORMAL LOW (ref 70–99)
Glucose-Capillary: 88 mg/dL (ref 70–99)
Glucose-Capillary: 161 mg/dL — ABNORMAL HIGH (ref 70–99)

## 2013-11-01 LAB — BASIC METABOLIC PANEL
Anion gap: 13 (ref 5–15)
BUN: 26 mg/dL — AB (ref 6–23)
CHLORIDE: 109 meq/L (ref 96–112)
CO2: 21 mEq/L (ref 19–32)
Calcium: 8.4 mg/dL (ref 8.4–10.5)
Creatinine, Ser: 2.16 mg/dL — ABNORMAL HIGH (ref 0.50–1.35)
GFR calc Af Amer: 39 mL/min — ABNORMAL LOW (ref >90)
GFR calc non Af Amer: 33 mL/min — ABNORMAL LOW (ref >90)
GLUCOSE: 80 mg/dL (ref 70–99)
POTASSIUM: 4.2 meq/L (ref 3.7–5.3)
Sodium: 143 mEq/L (ref 137–147)

## 2013-11-01 MED ORDER — INSULIN GLARGINE 100 UNIT/ML ~~LOC~~ SOLN
20.0000 [IU] | Freq: Two times a day (BID) | SUBCUTANEOUS | Status: DC
  Administered 2013-11-01 (×2): 20 [IU] via SUBCUTANEOUS
  Filled 2013-11-01 (×4): qty 0.2

## 2013-11-01 MED ORDER — INSULIN GLARGINE 100 UNIT/ML ~~LOC~~ SOLN
25.0000 [IU] | Freq: Two times a day (BID) | SUBCUTANEOUS | Status: DC
  Filled 2013-11-01 (×2): qty 0.25

## 2013-11-01 MED ORDER — INSULIN ASPART 100 UNIT/ML ~~LOC~~ SOLN: 4.0000 [IU] | Freq: Three times a day (TID) | SUBCUTANEOUS | Status: DC

## 2013-11-01 MED ORDER — INSULIN GLARGINE 100 UNIT/ML ~~LOC~~ SOLN: 22.0000 [IU] | Freq: Two times a day (BID) | SUBCUTANEOUS | Status: DC

## 2013-11-01 MED ORDER — INSULIN ASPART 100 UNIT/ML ~~LOC~~ SOLN
0.0000 [IU] | Freq: Three times a day (TID) | SUBCUTANEOUS | Status: DC
  Administered 2013-11-01 (×2): 3 [IU] via SUBCUTANEOUS
  Administered 2013-11-02: 8 [IU] via SUBCUTANEOUS
  Administered 2013-11-02: 3 [IU] via SUBCUTANEOUS

## 2013-11-01 NOTE — Progress Notes (Signed)
Hypoglycemic Event  CBG50  Treatment: 1 tube instant glucose  Symptoms: Pale, Sweaty and Hungry  Follow-up CBG: Time:2100 CBG Result:130   Possible Reasons for Event: Inadequate meal intake  Comments/MD notified:no    Amey Hossain, Michelene Gardener  Remember to initiate Hypoglycemia Order Set & complete

## 2013-11-01 NOTE — Progress Notes (Signed)
Hypoglycemic Event  CBG: 56  Treatment: 15 GM carbohydrate snack  Symptoms: Hungry  Follow-up CBG: [id number] CBG Result:88  Possible Reasons for Event: medication regimen  Comments/MD notified:no    Kiev Labrosse, Michelene Gardener  Remember to initiate Hypoglycemia Order Set & complete

## 2013-11-01 NOTE — Progress Notes (Signed)
[facility] PROGRESS NOTE Hospital Course: 53 y/o male with IDDM and non-compliance admitted for osteomyelitis/ cellulitis of left finger. The patient was taking metformin at home and the last time he used insulin was over 3 months ago when he was [place] it in jail.   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 -Status post I&D on 10/21 and returned to the OR for closure of wound on 10/22 - has remained afebrile.  AKI - CKD 3 - Previous cr normal 2009. - Cr has stabilize with IVF and is currently in 1.6-1.7 range  Hypernatremia: - improved with D5w.  Type I diabetes mellitus with complication, uncontrolled - A1c > 20 - Have ordered nutritionist and diabetes coordinator and ask for further teaching -becoming hypoglycemic- likely sugars better controlled with improvement in infection- will decrease Insulin doses -We will prescribe Lantus and NovoLog for him on discharge (pens) which she will be able to obtain via the match program  - will start insulin teaching now    Essential hypertension - BP stable- unable to add ACE I due to poor creatinine clearance  Anemia of chronic disease    Code Status: Full  Family Communication: Pt at bedside  Disposition Plan: Home with home health    Consultants:  Orthopedic    Antibiotics:  vanc and zosyn 10/26/2013- 10/27/13  Cipro and Doxy 10/ 19/ 15 >>   Objective: Filed Vitals:   10/31/13 0653 10/31/13 1543 11/01/13 0547 11/01/13 1100  BP: 125/88 125/69 101/62 120/65  Pulse: 84 99 90 94  Temp: 97.9 F (36.6 C) 98.6 F (37 C) 98.5 F (36.9 C)   TempSrc: Oral Oral Oral   Resp: 17 18 16 16   Height:      Weight: 73.7 kg (162 lb 7.7 oz)  71.895 kg (158 lb 8 oz)   SpO2: 98%  97% 99% 100%    Intake/Output Summary (Last 24 hours) at 11/01/13 1331 Last data filed at 11/01/13 1302  Gross per 24 hour  Intake   1320 ml  Output   1650 ml  Net   -330 ml   Filed Weights   10/30/13 0545 10/31/13 0653 11/01/13 0547  Weight: 71.124 kg (156 lb 12.8 oz) 73.7 kg (162 lb 7.7 oz) 71.895 kg (158 lb 8 oz)    Exam:  General: Alert, awake, oriented x3, in no acute distress.  HEENT: No bruits, no goiter.  Heart: Regular rate and rhythm. Lungs: Good air movement, clear   Data Reviewed: Basic Metabolic Panel:  Recent Labs Lab 10/28/13 0500 10/29/13 0542 10/30/13 0741 10/31/13 0543 11/01/13 0405  NA 150* 152* 145 138 143  K 3.8 4.0 3.8 4.1 4.2  CL 119* 115* 108 104 109  CO2 20 23 23 22 21   GLUCOSE 218* 119* 81 75 80  BUN 27* 31* 24* 25* 26*  CREATININE 1.68* 1.76* 1.65* 1.95* 2.16*  CALCIUM 7.8* 9.9 8.9 8.5 8.4   Liver Function Tests:  Recent Labs Lab 10/25/13 2250  AST 15  ALT 17  ALKPHOS 132*  BILITOT 0.3  PROT 9.3*  ALBUMIN 3.2*   No results found for this basename: LIPASE, AMYLASE,  in the last 168 hours No results found for this basename: AMMONIA,  in the last 168 hours CBC:  Recent  Labs Lab 10/25/13 2250 10/26/13 0418 10/31/13 0543 11/01/13 0405  WBC 15.2* 14.8* 11.7* 9.3  NEUTROABS 11.7*  --   --   --   HGB 11.2* 10.3* 10.0* 9.7*  HCT 32.7* 30.0* 30.7* 30.6*  MCV 82.0 81.5 84.6 85.5  PLT 375 368 427* 409*   Cardiac Enzymes: No results found for this basename: CKTOTAL, CKMB, CKMBINDEX, TROPONINI,  in the last 168 hours BNP (last 3 results) No results found for this basename: PROBNP,  in the last 8760 hours CBG:  Recent Labs Lab 10/31/13 2024 10/31/13 2112 11/01/13 0542 11/01/13 0609 11/01/13 1021  GLUCAP 50* 130* 56* 88 161*    Recent Results (from the past 240 hour(s))  SURGICAL PCR SCREEN     Status: None   Collection Time    10/26/13  9:16 AM      Result Value Ref Range Status   MRSA, PCR NEGATIVE  NEGATIVE  Final   Staphylococcus aureus NEGATIVE  NEGATIVE Final   Comment:            The Xpert SA Assay (FDA     approved for NASAL specimens     in patients over 20 years of age),     is one component of     a comprehensive surveillance     program.  Test performance has     been validated by [employer] for patients greater     than or equal to 75 year old.     It is not intended     to diagnose infection nor to     guide or monitor treatment.  ANAEROBIC CULTURE     Status: None   Collection Time    10/26/13  4:10 PM      Result Value Ref Range Status   Specimen Description FINGER LEFT   Final   Special Requests PATIENT ON FOLLOWING ZOSYN VANC   Final   Gram Stain     Final   Value: ABUNDANT WBC PRESENT,BOTH PMN AND MONONUCLEAR     MODERATE SQUAMOUS EPITHELIAL CELLS PRESENT     ABUNDANT GRAM POSITIVE COCCI IN PAIRS AND CHAINS     MODERATE YEAST     Performed at [facility]   Culture     Final   Value: NO ANAEROBES ISOLATED     Performed at [facility]   Report Status 10/31/2013 FINAL   Final  CULTURE, ROUTINE-ABSCESS     Status: None   Collection Time    10/26/13  4:10 PM      Result Value Ref Range Status   Specimen Description FINGER LEFT   Final   Special Requests PATIENT ON FOLLOWING ZOSYN VANC   Final   Gram Stain     Final   Value: MODERATE WBC PRESENT,BOTH PMN AND MONONUCLEAR     RARE SQUAMOUS EPITHELIAL CELLS PRESENT     ABUNDANT GRAM POSITIVE COCCI IN PAIRS AND CHAINS     MODERATE YEAST     Performed at [facility]   Culture     Final   Value: MODERATE GROUP B STREP(S.AGALACTIAE)ISOLATED     Note: TESTING AGAINST S. AGALACTIAE NOT ROUTINELY PERFORMED DUE TO PREDICTABILITY OF AMP/PEN/VAN SUSCEPTIBILITY.     Performed at [facility]   Report Status 10/29/2013 FINAL   Final     Studies: No results found.  Scheduled Meds: . ciprofloxacin  500 mg Oral BID  . doxycycline  100 mg  Oral Q12H  . enoxaparin (LOVENOX) injection   40 mg Subcutaneous Daily  . insulin aspart  0-15 Units Subcutaneous TID WC  . insulin aspart  0-5 Units Subcutaneous QHS  . insulin glargine  20 Units Subcutaneous BID  . sodium chloride  3 mL Intravenous Q12H   Continuous Infusions: . lactated ringers 10 mL/hr at 10/30/13 1214     [facility]  [facility] If 7PM-7AM, please contact night-coverage at [email or web address], password [facility] 11/01/2013, 1:31 PM  LOS: 7 days

## 2013-11-01 NOTE — Progress Notes (Addendum)
CSW met with patient today to complete a Depression Screening utilizing the PHQ-9.  Patient scored a total of 7 which indicates potential for MILD Depression. Patient states that he was taking Cogentin for his "nerves" but this medication was d/c'd about 6 months ago. He was unable to verbalize why it was d/c'd or by which MD.  Patient denies any homicidal or suicidal ideations. He feels that he has been feeling "down" since he injured his hand and with the loss of a digit on his left hand due to amputation. He is also diabetic and will return home on insulin which is a new medical adjustment for him.  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  He enjoys watching TV (news, sports, comedies etc) and also likes to work.  He wants to return to work when possible.  Patient also states that he is very involved in his church which provides him with spiritual and emotional support.  He will return home with his mother when stable. Patient denied any d/c needs.  Financial counselor Aleene Davidson) is following up with patient in attempt to obtain the [facility] card.  Patient was very appreciative of this.  CSW signing off.  Lorie Phenix. Pauline Good, [place]

## 2013-11-01 NOTE — Progress Notes (Signed)
1350 teaching on self administration of Lantus done. Pt showed understanding   and demonstrated efficiency in  In giving self . Including aseptic technique , reading label , skin preparation and discarding needles. Pt showed no anxiety
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
## 2013-11-02 DIAGNOSIS — A419 Sepsis, unspecified organism: Principal | ICD-10-CM

## 2013-11-02 LAB — BASIC METABOLIC PANEL
ANION GAP: 12 (ref 5–15)
BUN: 27 mg/dL — ABNORMAL HIGH (ref 6–23)
CO2: 21 mEq/L (ref 19–32)
Calcium: 8.6 mg/dL (ref 8.4–10.5)
Chloride: 113 mEq/L — ABNORMAL HIGH (ref 96–112)
Creatinine, Ser: 2.08 mg/dL — ABNORMAL HIGH (ref 0.50–1.35)
GFR calc non Af Amer: 35 mL/min — ABNORMAL LOW (ref >90)
GFR, EST AFRICAN AMERICAN: 40 mL/min — AB (ref >90)
Glucose, Bld: 124 mg/dL — ABNORMAL HIGH (ref 70–99)
POTASSIUM: 4.6 meq/L (ref 3.7–5.3)
Sodium: 146 mEq/L (ref 137–147)

## 2013-11-02 LAB — GLUCOSE, CAPILLARY
GLUCOSE-CAPILLARY: 65 mg/dL — AB (ref 70–99)
GLUCOSE-CAPILLARY: 174 mg/dL — AB (ref 70–99)
Glucose-Capillary: 99 mg/dL (ref 70–99)
Glucose-Capillary: 259 mg/dL — ABNORMAL HIGH (ref 70–99)

## 2013-11-02 MED ORDER — INSULIN PEN NEEDLE 30G X 8 MM MISC: 1.0000 | Freq: Four times a day (QID) | Status: DC

## 2013-11-02 MED ORDER — CIPROFLOXACIN HCL 500 MG PO TABS: 500.0000 mg | ORAL_TABLET | Freq: Two times a day (BID) | ORAL | Status: DC

## 2013-11-02 MED ORDER — INSULIN GLARGINE 100 UNIT/ML SOLOSTAR PEN: 20.0000 [IU] | PEN_INJECTOR | Freq: Two times a day (BID) | SUBCUTANEOUS | Status: DC

## 2013-11-02 MED ORDER — INSULIN GLARGINE 100 UNIT/ML ~~LOC~~ SOLN
18.0000 [IU] | Freq: Two times a day (BID) | SUBCUTANEOUS | Status: DC
  Administered 2013-11-02: 18 [IU] via SUBCUTANEOUS
  Filled 2013-11-02 (×3): qty 0.18

## 2013-11-02 MED ORDER — INSULIN GLARGINE 100 UNIT/ML ~~LOC~~ SOLN: 20.0000 [IU] | Freq: Two times a day (BID) | SUBCUTANEOUS | Status: DC

## 2013-11-02 MED ORDER — FREESTYLE SYSTEM KIT: 1.0000 | PACK | Status: DC | PRN

## 2013-11-02 MED ORDER — DOXYCYCLINE HYCLATE 100 MG PO TABS: 100.0000 mg | ORAL_TABLET | Freq: Two times a day (BID) | ORAL | Status: DC

## 2013-11-02 MED ORDER — INSULIN ASPART 100 UNIT/ML FLEXPEN: 2.0000 [IU] | PEN_INJECTOR | Freq: Three times a day (TID) | SUBCUTANEOUS | Status: DC

## 2013-11-02 NOTE — Progress Notes (Signed)
Hypoglycemic Event  CBG: 65  Treatment: 15 GM carbohydrate snack  Symptoms: None  Follow-up CBG: Time:0722 CBG Result:99  Possible Reasons for Event: Unknown  Comments/MD notified:Patient asymptomatic and given [place] sandwich    Analicia Skibinski  Remember to initiate Hypoglycemia Order Set & complete

## 2013-11-02 NOTE — Clinical Social Work Note (Signed)
Patient discharged home today and provided with taxi voucher to transport home.  Lucita Montoya Givens, MSW, LCSW [phone]
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
## 2013-11-02 NOTE — Discharge Instructions (Signed)
Dressing changes Clean wound with saline or sterile water. Coat wound with Triple antibiotic ointment and then Gel foam. Wrap with Kerlix. Do daily dressing.

## 2013-11-03 NOTE — Progress Notes (Signed)
Spoke to family regarding discharge instruction and stated they would pick up today.

## 2013-11-03 NOTE — Progress Notes (Addendum)
Pt given discharge instructions, verbalized understanding. IV discontinued.tax cab called for patient to pick up by ER.

## 2013-11-03 NOTE — Progress Notes (Signed)
Family called stated pt did not have a paperwork nor prescriptions. Found in the trash can in room. Family stated they would come a pick up, left at the desk.

## 2013-11-04 NOTE — Progress Notes (Signed)
UR completed  (Retro)  Marysue Fait K. Elison Worrel, RN, BSN, [place], CCM  11/04/2013 12:11 PM

## 2013-11-11 ENCOUNTER — Encounter: Payer: Self-pay | Admitting: Internal Medicine

## 2013-11-11 ENCOUNTER — Ambulatory Visit: Payer: Self-pay | Attending: Internal Medicine | Admitting: Internal Medicine

## 2013-11-11 VITALS — BP 132/82 | HR 89 | Temp 98.7°F | Resp 16

## 2013-11-11 DIAGNOSIS — I129 Hypertensive chronic kidney disease with stage 1 through stage 4 chronic kidney disease, or unspecified chronic kidney disease: Secondary | ICD-10-CM | POA: Insufficient documentation

## 2013-11-11 DIAGNOSIS — E1165 Type 2 diabetes mellitus with hyperglycemia: Secondary | ICD-10-CM | POA: Insufficient documentation

## 2013-11-11 DIAGNOSIS — Z139 Encounter for screening, unspecified: Secondary | ICD-10-CM

## 2013-11-11 DIAGNOSIS — Z87891 Personal history of nicotine dependence: Secondary | ICD-10-CM | POA: Insufficient documentation

## 2013-11-11 DIAGNOSIS — I10 Essential (primary) hypertension: Secondary | ICD-10-CM

## 2013-11-11 DIAGNOSIS — N289 Disorder of kidney and ureter, unspecified: Secondary | ICD-10-CM

## 2013-11-11 DIAGNOSIS — Z23 Encounter for immunization: Secondary | ICD-10-CM | POA: Insufficient documentation

## 2013-11-11 DIAGNOSIS — Z794 Long term (current) use of insulin: Secondary | ICD-10-CM | POA: Insufficient documentation

## 2013-11-11 DIAGNOSIS — M869 Osteomyelitis, unspecified: Secondary | ICD-10-CM | POA: Insufficient documentation

## 2013-11-11 DIAGNOSIS — T23022D Burn of unspecified degree of single left finger (nail) except thumb, subsequent encounter: Secondary | ICD-10-CM | POA: Insufficient documentation

## 2013-11-11 DIAGNOSIS — T798XXD Other early complications of trauma, subsequent encounter: Secondary | ICD-10-CM | POA: Insufficient documentation

## 2013-11-11 DIAGNOSIS — X088XXD Exposure to other specified smoke, fire and flames, subsequent encounter: Secondary | ICD-10-CM | POA: Insufficient documentation

## 2013-11-11 DIAGNOSIS — N189 Chronic kidney disease, unspecified: Secondary | ICD-10-CM | POA: Insufficient documentation

## 2013-11-11 DIAGNOSIS — Z89022 Acquired absence of left finger(s): Secondary | ICD-10-CM | POA: Insufficient documentation

## 2013-11-11 DIAGNOSIS — E139 Other specified diabetes mellitus without complications: Secondary | ICD-10-CM

## 2013-11-11 LAB — POCT GLYCOSYLATED HEMOGLOBIN (HGB A1C): Hemoglobin A1C: >15

## 2013-11-11 LAB — GLUCOSE, POCT (MANUAL RESULT ENTRY): POC GLUCOSE: 314 mg/dL — AB (ref 70–99)

## 2013-11-11 NOTE — Patient Instructions (Addendum)
Diabetes Mellitus and Food It is important for you to manage your blood sugar (glucose) level. Your blood glucose level can be greatly affected by what you eat. Eating healthier foods in the appropriate amounts throughout the day at about the same time each day will help you control your blood glucose level. It can also help slow or prevent worsening of your diabetes mellitus. Healthy eating may even help you improve the level of your blood pressure and reach or maintain a healthy weight.  HOW CAN FOOD AFFECT ME? Carbohydrates Carbohydrates affect your blood glucose level more than any other type of food. Your dietitian will help you determine how many carbohydrates to eat at each meal and teach you how to count carbohydrates. Counting carbohydrates is important to keep your blood glucose at a healthy level, especially if you are using insulin or taking certain medicines for diabetes mellitus. Alcohol Alcohol can cause sudden decreases in blood glucose (hypoglycemia), especially if you use insulin or take certain medicines for diabetes mellitus. Hypoglycemia can be a life-threatening condition. Symptoms of hypoglycemia (sleepiness, dizziness, and disorientation) are similar to symptoms of having too much alcohol.  If your health care provider has given you approval to drink alcohol, do so in moderation and use the following guidelines:  Women should not have more than one drink per day, and men should not have more than two drinks per day. One drink is equal to:  12 oz of beer.  5 oz of wine.  1 oz of hard liquor.  Do not drink on an empty stomach.  Keep yourself hydrated. Have water, diet soda, or unsweetened iced tea.  Regular soda, juice, and other mixers might contain a lot of carbohydrates and should be counted. WHAT FOODS ARE NOT RECOMMENDED? As you make food choices, it is important to remember that all foods are not the same. Some foods have fewer nutrients per serving than other  foods, even though they might have the same number of calories or carbohydrates. It is difficult to get your body what it needs when you eat foods with fewer nutrients. Examples of foods that you should avoid that are high in calories and carbohydrates but low in nutrients include:  Trans fats (most processed foods list trans fats on the Nutrition Facts label).  Regular soda.  Juice.  Candy.  Sweets, such as cake, pie, doughnuts, and cookies.  Fried foods. WHAT FOODS CAN I EAT? Have nutrient-rich foods, which will nourish your body and keep you healthy. The food you should eat also will depend on several factors, including:  The calories you need.  The medicines you take.  Your weight.  Your blood glucose level.  Your blood pressure level.  Your cholesterol level. You also should eat a variety of foods, including:  Protein, such as meat, poultry, fish, tofu, nuts, and seeds (lean animal proteins are best).  Fruits.  Vegetables.  Dairy products, such as milk, cheese, and yogurt (low fat is best).  Breads, grains, pasta, cereal, rice, and beans.  Fats such as olive oil, trans fat-free margarine, canola oil, avocado, and olives. DOES EVERYONE WITH DIABETES MELLITUS HAVE THE SAME MEAL PLAN? Because every person with diabetes mellitus is different, there is not one meal plan that works for everyone. It is very important that you meet with a dietitian who will help you create a meal plan that is just right for you. Document Released: 09/23/2004 Document Revised: 01/01/2013 Document Reviewed: 11/23/2012 ExitCare Patient Information 2015 [employer]. This   information is not intended to replace advice given to you by your health care provider. Make sure you discuss any questions you have with your health care provider. DASH Eating Plan DASH stands for "Dietary Approaches to Stop Hypertension." The DASH eating plan is a healthy eating plan that has been shown to reduce high  blood pressure (hypertension). Additional health benefits may include reducing the risk of type 2 diabetes mellitus, heart disease, and stroke. The DASH eating plan may also help with weight loss. WHAT DO I NEED TO KNOW ABOUT THE DASH EATING PLAN? For the DASH eating plan, you will follow these general guidelines:  Choose foods with a percent daily value for sodium of less than 5% (as listed on the food label).  Use salt-free seasonings or herbs instead of table salt or sea salt.  Check with your health care provider or pharmacist before using salt substitutes.  Eat lower-sodium products, often labeled as "lower sodium" or "no salt added."  Eat fresh foods.  Eat more vegetables, fruits, and low-fat dairy products.  Choose whole grains. Look for the word "whole" as the first word in the ingredient list.  Choose fish and skinless chicken or turkey more often than red meat. Limit fish, poultry, and meat to 6 oz (170 g) each day.  Limit sweets, desserts, sugars, and sugary drinks.  Choose heart-healthy fats.  Limit cheese to 1 oz (28 g) per day.  Eat more home-cooked food and less restaurant, buffet, and fast food.  Limit fried foods.  Cook foods using methods other than frying.  Limit canned vegetables. If you do use them, rinse them well to decrease the sodium.  When eating at a restaurant, ask that your food be prepared with less salt, or no salt if possible. WHAT FOODS CAN I EAT? Seek help from a dietitian for individual calorie needs. Grains Whole grain or whole wheat bread. Brown rice. Whole grain or whole wheat pasta. Quinoa, bulgur, and whole grain cereals. Low-sodium cereals. Corn or whole wheat flour tortillas. Whole grain cornbread. Whole grain crackers. Low-sodium crackers. Vegetables Fresh or frozen vegetables (raw, steamed, roasted, or grilled). Low-sodium or reduced-sodium tomato and vegetable juices. Low-sodium or reduced-sodium tomato sauce and paste. Low-sodium  or reduced-sodium canned vegetables.  Fruits All fresh, canned (in natural juice), or frozen fruits. Meat and Other Protein Products Ground beef (85% or leaner), grass-fed beef, or beef trimmed of fat. Skinless chicken or turkey. Ground chicken or turkey. Pork trimmed of fat. All fish and seafood. Eggs. Dried beans, peas, or lentils. Unsalted nuts and seeds. Unsalted canned beans. Dairy Low-fat dairy products, such as skim or 1% milk, 2% or reduced-fat cheeses, low-fat ricotta or cottage cheese, or plain low-fat yogurt. Low-sodium or reduced-sodium cheeses. Fats and Oils Tub margarines without trans fats. Light or reduced-fat mayonnaise and salad dressings (reduced sodium). Avocado. Safflower, olive, or canola oils. Natural peanut or almond butter. Other Unsalted popcorn and pretzels. The items listed above may not be a complete list of recommended foods or beverages. Contact your dietitian for more options. WHAT FOODS ARE NOT RECOMMENDED? Grains White bread. White pasta. White rice. Refined cornbread. Bagels and croissants. Crackers that contain trans fat. Vegetables Creamed or fried vegetables. Vegetables in a cheese sauce. Regular canned vegetables. Regular canned tomato sauce and paste. Regular tomato and vegetable juices. Fruits Dried fruits. Canned fruit in light or heavy syrup. Fruit juice. Meat and Other Protein Products Fatty cuts of meat. Ribs, chicken wings, bacon, sausage, bologna, salami, chitterlings, fatback, hot   dogs, bratwurst, and packaged luncheon meats. Salted nuts and seeds. Canned beans with salt. Dairy Whole or 2% milk, cream, half-and-half, and cream cheese. Whole-fat or sweetened yogurt. Full-fat cheeses or blue cheese. Nondairy creamers and whipped toppings. Processed cheese, cheese spreads, or cheese curds. Condiments Onion and garlic salt, seasoned salt, table salt, and sea salt. Canned and packaged gravies. Worcestershire sauce. Tartar sauce. Barbecue sauce.  Teriyaki sauce. Soy sauce, including reduced sodium. Steak sauce. Fish sauce. Oyster sauce. Cocktail sauce. Horseradish. Ketchup and mustard. Meat flavorings and tenderizers. Bouillon cubes. Hot sauce. Tabasco sauce. Marinades. Taco seasonings. Relishes. Fats and Oils Butter, stick margarine, lard, shortening, ghee, and bacon fat. Coconut, palm kernel, or palm oils. Regular salad dressings. Other Pickles and olives. Salted popcorn and pretzels. The items listed above may not be a complete list of foods and beverages to avoid. Contact your dietitian for more information. WHERE CAN I FIND MORE INFORMATION? National Heart, Lung, and Blood Institute: www.nhlbi.nih.gov/health/health-topics/topics/dash/ Document Released: 12/16/2010 Document Revised: 05/13/2013 Document Reviewed: 10/31/2012 ExitCare Patient Information 2015 [employer]. This information is not intended to replace advice given to you by your health care provider. Make sure you discuss any questions you have with your health care provider.  
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
## 2013-11-12 LAB — COMPLETE METABOLIC PANEL WITH GFR
ALK PHOS: 86 U/L (ref 39–117)
ALT: 20 U/L (ref 0–53)
AST: 21 U/L (ref 0–37)
Albumin: 3.6 g/dL (ref 3.5–5.2)
BUN: 15 mg/dL (ref 6–23)
CO2: 23 mEq/L (ref 19–32)
CREATININE: 1.95 mg/dL — AB (ref 0.50–1.35)
Calcium: 8.8 mg/dL (ref 8.4–10.5)
Chloride: 101 mEq/L (ref 96–112)
GFR, Est African American: 44 mL/min — ABNORMAL LOW
GFR, Est Non African American: 38 mL/min — ABNORMAL LOW
Glucose, Bld: 299 mg/dL — ABNORMAL HIGH (ref 70–99)
Potassium: 4.6 mEq/L (ref 3.5–5.3)
Sodium: 137 mEq/L (ref 135–145)
Total Bilirubin: 0.3 mg/dL (ref 0.2–1.2)
Total Protein: 6.9 g/dL (ref 6.0–8.3)

## 2013-11-12 LAB — VITAMIN D 25 HYDROXY (VIT D DEFICIENCY, FRACTURES): VIT D 25 HYDROXY: 19 ng/mL — AB (ref 30–89)

## 2013-11-12 LAB — TSH: TSH: 0.519 u[IU]/mL (ref 0.350–4.500)

## 2013-11-14 ENCOUNTER — Telehealth: Payer: Self-pay

## 2013-11-14 MED ORDER — VITAMIN D (ERGOCALCIFEROL) 1.25 MG (50000 UNIT) PO CAPS: 50000.0000 [IU] | ORAL_CAPSULE | ORAL | Status: DC

## 2013-11-14 NOTE — Telephone Encounter (Signed)
Patient not available Unable to leave message Mail box was full

## 2013-11-14 NOTE — Telephone Encounter (Signed)
-----   Message from Lorayne Marek, MD sent at 11/12/2013  9:26 AM EST ----- Blood work reviewed, noticed low vitamin D, call patient advise to start ergocalciferol 50,000 units once a week for the duration of  12 weeks. Also advise patient for compliance with diabetes medication and diet modification.Marland Kitchen

## 2013-12-11 ENCOUNTER — Ambulatory Visit: Payer: Self-pay | Admitting: *Deleted

## 2014-06-28 ENCOUNTER — Emergency Department (HOSPITAL_COMMUNITY)
Admission: EM | Admit: 2014-06-28 | Discharge: 2014-06-28 | Disposition: A | Discharge status: Home / Self Care | Payer: Self-pay | Attending: Emergency Medicine | Admitting: Emergency Medicine

## 2014-06-28 ENCOUNTER — Encounter (HOSPITAL_COMMUNITY): Payer: Self-pay | Admitting: Emergency Medicine

## 2014-06-28 DIAGNOSIS — Z794 Long term (current) use of insulin: Secondary | ICD-10-CM | POA: Insufficient documentation

## 2014-06-28 DIAGNOSIS — E119 Type 2 diabetes mellitus without complications: Secondary | ICD-10-CM | POA: Insufficient documentation

## 2014-06-28 DIAGNOSIS — Z5189 Encounter for other specified aftercare: Secondary | ICD-10-CM

## 2014-06-28 DIAGNOSIS — Z79899 Other long term (current) drug therapy: Secondary | ICD-10-CM | POA: Insufficient documentation

## 2014-06-28 DIAGNOSIS — Z720 Tobacco use: Secondary | ICD-10-CM | POA: Insufficient documentation

## 2014-06-28 DIAGNOSIS — Z4889 Encounter for other specified surgical aftercare: Secondary | ICD-10-CM | POA: Insufficient documentation

## 2014-06-28 NOTE — ED Notes (Signed)
Patients hand placed in a basin to soak.  Dr. Jeneen Rinks made aware.
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
## 2014-06-28 NOTE — Discharge Instructions (Signed)
Wash your hands and cut/trim your fingernails.  Use a soft bristle brush to clean the skin from your hands that has softened.  Call the [facility] wound care center as above for recheck evaluation appointment
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
## 2014-06-28 NOTE — ED Notes (Signed)
Dr. Jeneen Rinks at bedside with patient.

## 2015-06-29 ENCOUNTER — Encounter (HOSPITAL_COMMUNITY): Payer: Self-pay | Admitting: Emergency Medicine

## 2015-06-29 ENCOUNTER — Emergency Department (HOSPITAL_COMMUNITY)
Admission: EM | Admit: 2015-06-29 | Discharge: 2015-06-29 | Disposition: A | Discharge status: Home / Self Care | Payer: Self-pay | Attending: Emergency Medicine | Admitting: Emergency Medicine

## 2015-06-29 DIAGNOSIS — F17200 Nicotine dependence, unspecified, uncomplicated: Secondary | ICD-10-CM | POA: Insufficient documentation

## 2015-06-29 DIAGNOSIS — F17220 Nicotine dependence, chewing tobacco, uncomplicated: Secondary | ICD-10-CM | POA: Insufficient documentation

## 2015-06-29 DIAGNOSIS — E1165 Type 2 diabetes mellitus with hyperglycemia: Secondary | ICD-10-CM | POA: Insufficient documentation

## 2015-06-29 DIAGNOSIS — R739 Hyperglycemia, unspecified: Secondary | ICD-10-CM

## 2015-06-29 DIAGNOSIS — Z794 Long term (current) use of insulin: Secondary | ICD-10-CM | POA: Insufficient documentation

## 2015-06-29 LAB — COMPREHENSIVE METABOLIC PANEL
ALBUMIN: 3.9 g/dL (ref 3.5–5.0)
ALT: 17 U/L (ref 17–63)
ANION GAP: 8 (ref 5–15)
AST: 19 U/L (ref 15–41)
Alkaline Phosphatase: 71 U/L (ref 38–126)
BILIRUBIN TOTAL: 0.9 mg/dL (ref 0.3–1.2)
BUN: 31 mg/dL — ABNORMAL HIGH (ref 6–20)
CO2: 23 mmol/L (ref 22–32)
Calcium: 8.8 mg/dL — ABNORMAL LOW (ref 8.9–10.3)
Chloride: 100 mmol/L — ABNORMAL LOW (ref 101–111)
Creatinine, Ser: 1.79 mg/dL — ABNORMAL HIGH (ref 0.61–1.24)
GFR calc Af Amer: 48 mL/min — ABNORMAL LOW (ref >60)
GFR calc non Af Amer: 41 mL/min — ABNORMAL LOW (ref >60)
GLUCOSE: 428 mg/dL — AB (ref 65–99)
POTASSIUM: 3.9 mmol/L (ref 3.5–5.1)
SODIUM: 131 mmol/L — AB (ref 135–145)
TOTAL PROTEIN: 7.3 g/dL (ref 6.5–8.1)

## 2015-06-29 LAB — CBC
HEMATOCRIT: 38.3 % — AB (ref 39.0–52.0)
HEMOGLOBIN: 12.8 g/dL — AB (ref 13.0–17.0)
MCH: 27.9 pg (ref 26.0–34.0)
MCHC: 33.4 g/dL (ref 30.0–36.0)
MCV: 83.4 fL (ref 78.0–100.0)
Platelets: 276 10*3/uL (ref 150–400)
RBC: 4.59 MIL/uL (ref 4.22–5.81)
RDW: 13.6 % (ref 11.5–15.5)
WBC: 9.5 10*3/uL (ref 4.0–10.5)

## 2015-06-29 LAB — CBG MONITORING, ED
GLUCOSE-CAPILLARY: 272 mg/dL — AB (ref 65–99)
Glucose-Capillary: 439 mg/dL — ABNORMAL HIGH (ref 65–99)

## 2015-06-29 MED ORDER — METFORMIN HCL 500 MG PO TABS: 500.0000 mg | ORAL_TABLET | Freq: Every day | ORAL | Status: DC

## 2015-06-29 MED ORDER — SODIUM CHLORIDE 0.9 % IV BOLUS (SEPSIS)
1000.0000 mL | Freq: Once | INTRAVENOUS | Status: AC
  Administered 2015-06-29: 1000 mL via INTRAVENOUS

## 2015-06-29 NOTE — Discharge Instructions (Signed)
Follow-up with a primary care provider or the health department to be reevaluated for your high blood sugar.   Return to emergency department if you experience abdominal pain, nausea, vomiting, fever, chest, shortness of breath.  Hyperglycemia Hyperglycemia occurs when the glucose (sugar) in your blood is too high. Hyperglycemia can happen for many reasons, but it most often happens to people who do not know they have diabetes or are not managing their diabetes properly.  CAUSES  Whether you have diabetes or not, there are other causes of hyperglycemia. Hyperglycemia can occur when you have diabetes, but it can also occur in other situations that you might not be as aware of, such as: Diabetes  If you have diabetes and are having problems controlling your blood glucose, hyperglycemia could occur because of some of the following reasons:  Not following your meal plan.  Not taking your diabetes medications or not taking it properly.  Exercising less or doing less activity than you normally do.  Being sick. Pre-diabetes  This cannot be ignored. Before people develop Type 2 diabetes, they almost always have "pre-diabetes." This is when your blood glucose levels are higher than normal, but not yet high enough to be diagnosed as diabetes. Research has shown that some long-term damage to the body, especially the heart and circulatory system, may already be occurring during pre-diabetes. If you take action to manage your blood glucose when you have pre-diabetes, you may delay or prevent Type 2 diabetes from developing. Stress  If you have diabetes, you may be "diet" controlled or on oral medications or insulin to control your diabetes. However, you may find that your blood glucose is higher than usual in the hospital whether you have diabetes or not. This is often referred to as "stress hyperglycemia." Stress can elevate your blood glucose. This happens because of hormones put out by the body during  times of stress. If stress has been the cause of your high blood glucose, it can be followed regularly by your caregiver. That way he/she can make sure your hyperglycemia does not continue to get worse or progress to diabetes. Steroids  Steroids are medications that act on the infection fighting system (immune system) to block inflammation or infection. One side effect can be a rise in blood glucose. Most people can produce enough extra insulin to allow for this rise, but for those who cannot, steroids make blood glucose levels go even higher. It is not unusual for steroid treatments to "uncover" diabetes that is developing. It is not always possible to determine if the hyperglycemia will go away after the steroids are stopped. A special blood test called an A1c is sometimes done to determine if your blood glucose was elevated before the steroids were started. SYMPTOMS  Thirsty.  Frequent urination.  Dry mouth.  Blurred vision.  Tired or fatigue.  Weakness.  Sleepy.  Tingling in feet or leg. DIAGNOSIS  Diagnosis is made by monitoring blood glucose in one or all of the following ways:  A1c test. This is a chemical found in your blood.  Fingerstick blood glucose monitoring.  Laboratory results. TREATMENT  First, knowing the cause of the hyperglycemia is important before the hyperglycemia can be treated. Treatment may include, but is not be limited to:  Education.  Change or adjustment in medications.  Change or adjustment in meal plan.  Treatment for an illness, infection, etc.  More frequent blood glucose monitoring.  Change in exercise plan.  Decreasing or stopping steroids.  Lifestyle changes.  HOME CARE INSTRUCTIONS   Test your blood glucose as directed.  Exercise regularly. Your caregiver will give you instructions about exercise. Pre-diabetes or diabetes which comes on with stress is helped by exercising.  Eat wholesome, balanced meals. Eat often and at  regular, fixed times. Your caregiver or nutritionist will give you a meal plan to guide your sugar intake.  Being at an ideal weight is important. If needed, losing as little as 10 to 15 pounds may help improve blood glucose levels. SEEK MEDICAL CARE IF:   You have questions about medicine, activity, or diet.  You continue to have symptoms (problems such as increased thirst, urination, or weight gain). SEEK IMMEDIATE MEDICAL CARE IF:   You are vomiting or have diarrhea.  Your breath smells fruity.  You are breathing faster or slower.  You are very sleepy or incoherent.  You have numbness, tingling, or pain in your feet or hands.  You have chest pain.  Your symptoms get worse even though you have been following your caregiver's orders.  If you have any other questions or concerns.   This information is not intended to replace advice given to you by your health care provider. Make sure you discuss any questions you have with your health care provider.   Document Released: 06/22/2000 Document Revised: 03/21/2011 Document Reviewed: 09/02/2014 Elsevier Interactive Patient Education [employer].

## 2015-06-29 NOTE — ED Notes (Signed)
Per EMS-left index finger pain/infection-states he had it amputated 2 months ago-has [facility] to clean it-here to have it checked out

## 2015-06-29 NOTE — ED Notes (Signed)
Per EMS has been out of DM meds for over a week

## 2015-06-29 NOTE — Progress Notes (Addendum)
ED CM reviewed ED encounter notes indicating pt with recent index finger amputation and home health visited him  Pt unable to provide CM with name of home health agency.   Pt confirms with ED CM he is homeless and is aware of services of [facility]  Pt tells CM he had amputation of left index finger "three months ago at [facility]" but EPIC indicates pt had this surgery in 2015  Pt able to tell Cm the president name "Trump" unable to tell CM the date but can tell CM the year "2017" "[place]" States the [employer] address is "my momma" "[place]" Pt offered a number for his aunt Clelia Schaumann Entered in EPIC demographics Pt informed Cm he had "Medicare part B only" but EPIC indicates pt is uninsured/self pay Pt states he informed ED registration of his medicare coverage but does not have a medicare card with him ED CM questions many of pt statements as incorrect answers Pt stated he gets medications from [facility] but use to go to "[place]" Cm encourages use of [facility] per goodrx.com to get best discounted cost for metformin Pt agreed to do this EPIC [facility] CM discussed the difference in the cost of [facility] at [facility]  Pt constantly picking at the hair on his face during Cm interaction with him   CM spoke with pt who confirms uninsured [employer] resident with no pcp.  CM discussed and provided written information to assist pt with determining choice for uninsured accepting pcps, discussed the importance of pcp vs EDP services for f/u care, www.needymeds.org, www.goodrx.com, discounted pharmacies and other [employer] such as [employer], affordable care act, financial assistance, uninsured dental services, [facility] med assist, DSS and  health department  Reviewed resources for [employer] uninsured accepting pcps like [facility], family medicine at [employer], [facility], [facility], local urgent care centers,  [facility], [facility] family practice, general medical clinics, family services of the [place], [facility] urgent care plus others, medication resources, [facility] out patient pharmacies and housing Pt voiced understanding and appreciation of resources provided   Provided [facility] contact information Pt agreed to a referral Cm completed referral and to use his mother's address as his  Pt to be contact by [facility] clinical liason

## 2015-06-29 NOTE — Progress Notes (Addendum)
ED CM had ED registration to check to see if pt has medicare  [place] unable to verify pt with any insurance coverage including medicare  This pt is self pay and with memory issues Noted strong body odor

## 2015-06-29 NOTE — ED Notes (Signed)
Patient requested to have writer clean the left amputated finger. Patient then wanted his left hand and other fingers cleaned. No redness or edema noted to the left amputated finger. Patient denies any pain of the left amputated finger. Patient smells of urine and states he is homeless.

## 2015-06-29 NOTE — ED Provider Notes (Signed)
CSN: [id number]     Arrival date & time 06/29/15  1125 History   First MD Initiated Contact with Patient 06/29/15 1147     Chief Complaint  Patient presents with  . Hand Pain     (Consider location/radiation/quality/duration/timing/severity/associated sxs/prior Treatment) HPI   Patient is a 55 year old male with a history of diabetes who presents the ED to have his finger checked. Patient states he had his left index finger amputated roughly 2 months ago. He wants [place] to clean it. He denies being in any pain. Patient is no other complaints. Patient states he is out of his metformin for a few weeks. He denies abdominal pain, nausea, vomiting, chest pain, shortness of breath, fever, headache.  Past Medical History  Diagnosis Date  . Diabetes mellitus without complication [facility])    Past Surgical History  Procedure Laterality Date  . I&d extremity Left 10/26/2013    Procedure: IRRIGATION AND DEBRIDEMENT EXTREMITY,PARTIAL AMPUTATION LEFT INDEX FINGER;  Surgeon: Dayna Barker, MD;  Location: [place];  Service: Plastics;  Laterality: Left;  . I&d extremity Left 10/30/2013    Procedure: IRRIGATION AND DEBRIDEMENT AND REVISION AMPUTATION OF LEFT INDEX FINGER;  Surgeon: Dayna Barker, MD;  Location: [place];  Service: Plastics;  Laterality: Left;   Family History  Problem Relation Age of Onset  . Hypertension Mother   . Diabetes Father   . Diabetes Sister   . Diabetes Brother   . Heart disease Brother    Social History  Substance Use Topics  . Smoking status: Current Every Day Smoker -- 1.00 packs/day  . Smokeless tobacco: Current User    Types: Chew  . Alcohol Use: No    Review of Systems  Respiratory: Negative for shortness of breath.   Cardiovascular: Negative for chest pain.  Gastrointestinal: Negative for nausea, vomiting and abdominal pain.  Skin:       Wound check  Neurological: Negative for headaches.      Allergies  Review of patient's allergies indicates no known  allergies.  Home Medications   Prior to Admission medications   Medication Sig Start Date End Date Taking? Authorizing Provider  glucose monitoring kit (FREESTYLE) monitoring kit 1 each by Does not apply route as needed for other. Dispense any model that is covered- dispense testing supplies for Q AC/ HS accuchecks- 1 month supply with one refil. 11/02/13  Yes Debbe Odea, MD  insulin aspart (NOVOLOG FLEXPEN) 100 UNIT/ML FlexPen Inject 2 Units into the skin 3 (three) times daily with meals. 11/02/13  Yes Debbe Odea, MD  Insulin Glargine (LANTUS SOLOSTAR) 100 UNIT/ML Solostar Pen Inject 20 Units into the skin 2 (two) times daily. 11/02/13  Yes Debbe Odea, MD  Insulin Pen Needle (NOVOFINE) 30G X 8 MM MISC Inject 10 each into the skin 4 (four) times daily. 11/02/13  Yes Debbe Odea, MD  metFORMIN (GLUCOPHAGE) 500 MG tablet Take 1 tablet (500 mg total) by mouth daily with breakfast. 06/29/15   Kalman Drape, PA  Vitamin D, Ergocalciferol, (DRISDOL) 50000 UNITS CAPS capsule Take 1 capsule (50,000 Units total) by mouth every 7 (seven) days. Patient not taking: Reported on 06/29/2015 11/14/13   Lorayne Marek, MD   BP 130/83 mmHg  Pulse 66  Temp(Src) 98 F (36.7 C) (Oral)  Resp 16  SpO2 99% Physical Exam  Constitutional: He appears well-developed and well-nourished. No distress.  HENT:  Head: Normocephalic and atraumatic.  Eyes: Conjunctivae are normal.  Cardiovascular: Normal rate, regular rhythm and normal heart sounds.  Exam  reveals no gallop and no friction rub.   No murmur heard. Pulmonary/Chest: Effort normal and breath sounds normal. No respiratory distress. He has no wheezes. He has no rales.  Musculoskeletal: Normal range of motion.  Examination of left hand revealed amputation of the left index finger at the PIP joint, well-healed amputation without edema, erythema or open wound, no signs of infection, full AROM, no TTP, neurovascular intact distally.  Neurological: He is alert.  Coordination normal.  Skin: Skin is warm and dry. He is not diaphoretic.  Nursing note and vitals reviewed.   ED Course  Procedures (including critical care time) Labs Review Labs Reviewed  COMPREHENSIVE METABOLIC PANEL - Abnormal; Notable for the following:    Sodium 131 (*)    Chloride 100 (*)    Glucose, Bld 428 (*)    BUN 31 (*)    Creatinine, Ser 1.79 (*)    Calcium 8.8 (*)    GFR calc non Af Amer 41 (*)    GFR calc Af Amer 48 (*)    All other components within normal limits  CBC - Abnormal; Notable for the following:    Hemoglobin 12.8 (*)    HCT 38.3 (*)    All other components within normal limits  CBG MONITORING, ED - Abnormal; Notable for the following:    Glucose-Capillary 439 (*)    All other components within normal limits  CBG MONITORING, ED - Abnormal; Notable for the following:    Glucose-Capillary 272 (*)    All other components within normal limits    Imaging Review No results found. I have personally reviewed and evaluated these images and lab results as part of my medical decision-making.   EKG Interpretation None      MDM   Final diagnoses:  Hyperglycemia   Patient presented to triage with complaints of left hand pain. Upon talking the patient he denied having pain in his left hand. He asked [place] to clean his hand for him. Patient has an amputation of his left index finger at the PIP joint that is well-healed without signs of infection. Patient afebrile, VSS without complaints.  Patient was found to be hyperglycemic upon arrival to the ED with hyponatremia, elevated BUN, elevated serum creatinine. Likely the patient is dehydrated. Gave patient a liter of fluids and CBG decreased to 272.  I called [facility] and [facility] listed in the patients chart and they do not have a medication history for this pt. gave patient a prescription in for 500 mg once daily metformin and instructed him to follow up with a primary care provider or the health department  to be evaluated for his hyperglycemia.   Patient was stable at time of discharge. Discussed strict return precautions. Patient expressed understanding to the discharge instructions.    Kalman Drape, [place] 06/29/15 1556  Lacretia Leigh, MD 06/30/15 [phone]

## 2016-11-01 LAB — GLUCOSE, POCT (MANUAL RESULT ENTRY): POC Glucose: 323 mg/dl — AB (ref 70–99)

## 2016-11-01 LAB — POCT GLYCOSYLATED HEMOGLOBIN (HGB A1C): Hemoglobin A1C: 14

## 2018-03-04 ENCOUNTER — Other Ambulatory Visit: Payer: Self-pay

## 2018-03-04 ENCOUNTER — Emergency Department (HOSPITAL_COMMUNITY)
Admission: EM | Admit: 2018-03-04 | Discharge: 2018-03-05 | Disposition: A | Discharge status: Home / Self Care | Payer: Self-pay | Attending: Emergency Medicine | Admitting: Emergency Medicine

## 2018-03-04 ENCOUNTER — Encounter (HOSPITAL_COMMUNITY): Payer: Self-pay | Admitting: Emergency Medicine

## 2018-03-04 DIAGNOSIS — I10 Essential (primary) hypertension: Secondary | ICD-10-CM | POA: Insufficient documentation

## 2018-03-04 DIAGNOSIS — M79642 Pain in left hand: Secondary | ICD-10-CM | POA: Insufficient documentation

## 2018-03-04 DIAGNOSIS — F17200 Nicotine dependence, unspecified, uncomplicated: Secondary | ICD-10-CM | POA: Insufficient documentation

## 2018-03-04 DIAGNOSIS — E109 Type 1 diabetes mellitus without complications: Secondary | ICD-10-CM | POA: Insufficient documentation

## 2018-03-04 HISTORY — DX: Complete traumatic metacarpophalangeal amputation of unspecified finger, initial encounter: S68.119A

## 2018-03-04 NOTE — ED Triage Notes (Signed)
Per [facility], Pt reports L hand pain x 3 days. Pt had amputation to L index finger 6 months ago. Denies injuries, no obvious deformity noted.

## 2018-03-05 NOTE — ED Provider Notes (Signed)
[facility] EMERGENCY DEPARTMENT Provider Note   CSN: [id number] Arrival date & time: 03/04/18  2338    History   Chief Complaint Chief Complaint  Patient presents with  . Hand Pain    HPI Timothy Madden is a 58 y.o. male.     HPI   Timothy Madden is a 58 y.o. male, with a history of DM, presenting to the ED with left hand pain for the last 3 days.  States the pain is aching, mild, nonradiating.  He has had this pain before and states it comes up whenever the weather changes. Denies fever, numbness, weakness, trauma, swelling, color change, or any other complaints.   Past Medical History:  Diagnosis Date  . Amputation of finger, left   . Diabetes mellitus without complication [facility])     Patient Active Problem List   Diagnosis Date Noted  . Osteomyelitis of finger of left hand ([place]) 10/26/2013  . Osteomyelitis ([place]) 10/26/2013  . Sepsis ([place]) 10/26/2013  . AKI (acute kidney injury) ([place]) 10/26/2013  . Hyponatremia 10/26/2013  . Type I diabetes mellitus with complication, uncontrolled ([place]) 10/26/2013  . Essential hypertension 10/26/2013  . Anemia of chronic disease 10/26/2013  . PSYCHIATRIC DISORDER 07/19/2006  . TOBACCO ABUSE 07/19/2006  . DIABETES MELLITUS, TYPE II 11/30/2005  . ANXIETY 11/30/2005    Past Surgical History:  Procedure Laterality Date  . I&D EXTREMITY Left 10/26/2013   Procedure: IRRIGATION AND DEBRIDEMENT EXTREMITY,PARTIAL AMPUTATION LEFT INDEX FINGER;  Surgeon: Dayna Barker, MD;  Location: [place];  Service: Plastics;  Laterality: Left;  . I&D EXTREMITY Left 10/30/2013   Procedure: IRRIGATION AND DEBRIDEMENT AND REVISION AMPUTATION OF LEFT INDEX FINGER;  Surgeon: Dayna Barker, MD;  Location: [place];  Service: Plastics;  Laterality: Left;        Home Medications    Prior to Admission medications   Medication Sig Start Date End Date Taking? Authorizing Provider  glucose monitoring kit (FREESTYLE) monitoring kit 1  each by Does not apply route as needed for other. Dispense any model that is covered- dispense testing supplies for Q AC/ HS accuchecks- 1 month supply with one refil. 11/02/13   Debbe Odea, MD  insulin aspart (NOVOLOG FLEXPEN) 100 UNIT/ML FlexPen Inject 2 Units into the skin 3 (three) times daily with meals. 11/02/13   Debbe Odea, MD  Insulin Glargine (LANTUS SOLOSTAR) 100 UNIT/ML Solostar Pen Inject 20 Units into the skin 2 (two) times daily. 11/02/13   Debbe Odea, MD  Insulin Pen Needle (NOVOFINE) 30G X 8 MM MISC Inject 10 each into the skin 4 (four) times daily. 11/02/13   Debbe Odea, MD  metFORMIN (GLUCOPHAGE) 500 MG tablet Take 1 tablet (500 mg total) by mouth daily with breakfast. 06/29/15   Focht, Fraser Din, PA  Vitamin D, Ergocalciferol, (DRISDOL) 50000 UNITS CAPS capsule Take 1 capsule (50,000 Units total) by mouth every 7 (seven) days. Patient not taking: Reported on 06/29/2015 11/14/13   Lorayne Marek, MD    Family History Family History  Problem Relation Age of Onset  . Hypertension Mother   . Diabetes Father   . Diabetes Sister   . Diabetes Brother   . Heart disease Brother     Social History Social History   Tobacco Use  . Smoking status: Current Every Day Smoker    Packs/day: 1.00  . Smokeless tobacco: Current User    Types: Chew  Substance Use Topics  . Alcohol use: No    Alcohol/week: 0.0 standard  drinks  . Drug use: No     Allergies   Patient has no known allergies.   Review of Systems Review of Systems  Constitutional: Negative for fever.  Musculoskeletal: Positive for arthralgias.  Skin: Negative for color change.  Neurological: Negative for weakness and numbness.     Physical Exam Updated Vital Signs BP (!) 152/88   Pulse 78   Temp 98.4 F (36.9 C) (Oral)   Resp 18   SpO2 98%   Physical Exam Vitals signs and nursing note reviewed.  Constitutional:      General: He is not in acute distress.    Appearance: He is well-developed.  He is not diaphoretic.  HENT:     Head: Normocephalic and atraumatic.  Eyes:     Conjunctiva/sclera: Conjunctivae normal.  Neck:     Musculoskeletal: Neck supple.  Cardiovascular:     Rate and Rhythm: Normal rate and regular rhythm.     Pulses:          Radial pulses are 2+ on the right side and 2+ on the left side.  Pulmonary:     Effort: Pulmonary effort is normal.  Musculoskeletal:     Comments: Patient indicates his pain is in or near the MCP joint of the middle finger of the left hand. He has full range of motion without noted difficulty or hesitation.  No swelling, color change, deformity, instability, crepitus.  There really is not even discernible tenderness on my exam.  Skin:    General: Skin is warm and dry.     Capillary Refill: Capillary refill takes less than 2 seconds.     Coloration: Skin is not pale.  Neurological:     Mental Status: He is alert.     Comments: Sensation to light touch grossly intact throughout the left hand and into the left fingers. Grip strength equal bilaterally. Flexion and extension against resistance intact in the left fingers. Abduction and adduction of the fingers intact against resistance.  Psychiatric:        Behavior: Behavior normal.      ED Treatments / Results  Labs (all labs ordered are listed, but only abnormal results are displayed) Labs Reviewed - No data to display  EKG None  Radiology No results found.  Procedures Procedures (including critical care time)  Medications Ordered in ED Medications - No data to display   Initial Impression / Assessment and Plan / ED Course  I have reviewed the triage vital signs and the nursing notes.  Pertinent labs & imaging results that were available during my care of the patient were reviewed by me and considered in my medical decision making (see chart for details).        Patient presents with pain in the left MCP joint that he states comes and goes with weather changes.   There are no discernible abnormalities noted on my exam.  No signs of neurovascular compromise.  No signs of infection. The patient was given instructions for home care as well as return precautions. Patient voices understanding of these instructions, accepts the plan, and is comfortable with discharge.  Final Clinical Impressions(s) / ED Diagnoses   Final diagnoses:  Left hand pain    ED Discharge Orders    None       Layla Maw 03/05/18 0309    Ward, Delice Bison, DO 03/05/18 [phone]

## 2018-03-05 NOTE — Discharge Instructions (Addendum)
°  Antiinflammatory medications: Take 600 mg of ibuprofen every 6 hours or 440 mg (over the counter dose) to 500 mg (prescription dose) of naproxen every 12 hours for the next 3 days. After this time, these medications may be used as needed for pain. Take these medications with food to avoid upset stomach. Choose only one of these medications, do not take them together. Acetaminophen (generic for Tylenol): Should you continue to have additional pain while taking the ibuprofen or naproxen, you may add in acetaminophen as needed. Your daily total maximum amount of acetaminophen from all sources should be limited to 4000mg /day for persons without liver problems, or 2000mg /day for those with liver problems. Ice: May apply ice to the area over the next 24 hours for 15 minutes at a time to reduce swelling. Elevation: Keep the extremity elevated as often as possible to reduce pain and inflammation. Exercises: Start by performing these exercises a few times a week, increasing the frequency until you are performing them twice daily.  Follow up: Follow-up with your primary care provider on this matter. Return: Return to the ED for numbness, weakness, increasing pain, overall worsening symptoms, loss of function, or any other major concerns.  For prescription assistance, may try using prescription discount sites or apps, such as goodrx.com

## 2018-06-11 DIAGNOSIS — E1110 Type 2 diabetes mellitus with ketoacidosis without coma: Secondary | ICD-10-CM

## 2018-06-11 HISTORY — DX: Type 2 diabetes mellitus with ketoacidosis without coma: E11.10

## 2018-06-28 ENCOUNTER — Inpatient Hospital Stay (HOSPITAL_COMMUNITY): Payer: Self-pay

## 2018-06-28 ENCOUNTER — Other Ambulatory Visit: Payer: Self-pay

## 2018-06-28 ENCOUNTER — Inpatient Hospital Stay (HOSPITAL_COMMUNITY)
Admission: EM | Admit: 2018-06-28 | Discharge: 2018-07-02 | DRG: 638 | Disposition: A | Discharge status: Home / Self Care | Payer: Self-pay | Attending: Internal Medicine | Admitting: Internal Medicine

## 2018-06-28 ENCOUNTER — Encounter (HOSPITAL_COMMUNITY): Payer: Self-pay | Admitting: Emergency Medicine

## 2018-06-28 DIAGNOSIS — Z1159 Encounter for screening for other viral diseases: Secondary | ICD-10-CM

## 2018-06-28 DIAGNOSIS — Z9119 Patient's noncompliance with other medical treatment and regimen: Secondary | ICD-10-CM

## 2018-06-28 DIAGNOSIS — R739 Hyperglycemia, unspecified: Secondary | ICD-10-CM

## 2018-06-28 DIAGNOSIS — N320 Bladder-neck obstruction: Secondary | ICD-10-CM | POA: Diagnosis present

## 2018-06-28 DIAGNOSIS — R319 Hematuria, unspecified: Secondary | ICD-10-CM

## 2018-06-28 DIAGNOSIS — Z89022 Acquired absence of left finger(s): Secondary | ICD-10-CM

## 2018-06-28 DIAGNOSIS — Z79899 Other long term (current) drug therapy: Secondary | ICD-10-CM

## 2018-06-28 DIAGNOSIS — A084 Viral intestinal infection, unspecified: Secondary | ICD-10-CM | POA: Diagnosis present

## 2018-06-28 DIAGNOSIS — R338 Other retention of urine: Secondary | ICD-10-CM | POA: Diagnosis present

## 2018-06-28 DIAGNOSIS — E871 Hypo-osmolality and hyponatremia: Secondary | ICD-10-CM | POA: Diagnosis present

## 2018-06-28 DIAGNOSIS — N184 Chronic kidney disease, stage 4 (severe): Secondary | ICD-10-CM | POA: Diagnosis present

## 2018-06-28 DIAGNOSIS — Z596 Low income: Secondary | ICD-10-CM

## 2018-06-28 DIAGNOSIS — Z833 Family history of diabetes mellitus: Secondary | ICD-10-CM

## 2018-06-28 DIAGNOSIS — N179 Acute kidney failure, unspecified: Secondary | ICD-10-CM | POA: Diagnosis present

## 2018-06-28 DIAGNOSIS — E1110 Type 2 diabetes mellitus with ketoacidosis without coma: Principal | ICD-10-CM | POA: Diagnosis present

## 2018-06-28 DIAGNOSIS — R748 Abnormal levels of other serum enzymes: Secondary | ICD-10-CM | POA: Diagnosis present

## 2018-06-28 DIAGNOSIS — E876 Hypokalemia: Secondary | ICD-10-CM | POA: Diagnosis present

## 2018-06-28 DIAGNOSIS — N189 Chronic kidney disease, unspecified: Secondary | ICD-10-CM | POA: Diagnosis present

## 2018-06-28 DIAGNOSIS — Z794 Long term (current) use of insulin: Secondary | ICD-10-CM

## 2018-06-28 DIAGNOSIS — N401 Enlarged prostate with lower urinary tract symptoms: Secondary | ICD-10-CM | POA: Diagnosis present

## 2018-06-28 DIAGNOSIS — F17210 Nicotine dependence, cigarettes, uncomplicated: Secondary | ICD-10-CM | POA: Diagnosis present

## 2018-06-28 DIAGNOSIS — F17200 Nicotine dependence, unspecified, uncomplicated: Secondary | ICD-10-CM | POA: Diagnosis present

## 2018-06-28 DIAGNOSIS — Z8249 Family history of ischemic heart disease and other diseases of the circulatory system: Secondary | ICD-10-CM

## 2018-06-28 DIAGNOSIS — E860 Dehydration: Secondary | ICD-10-CM

## 2018-06-28 DIAGNOSIS — N136 Pyonephrosis: Secondary | ICD-10-CM | POA: Diagnosis present

## 2018-06-28 DIAGNOSIS — I129 Hypertensive chronic kidney disease with stage 1 through stage 4 chronic kidney disease, or unspecified chronic kidney disease: Secondary | ICD-10-CM | POA: Diagnosis present

## 2018-06-28 DIAGNOSIS — E1122 Type 2 diabetes mellitus with diabetic chronic kidney disease: Secondary | ICD-10-CM | POA: Diagnosis present

## 2018-06-28 HISTORY — DX: Other specified postprocedural states: Z98.890

## 2018-06-28 HISTORY — DX: Other specified postprocedural states: R11.2

## 2018-06-28 HISTORY — DX: Other complications of anesthesia, initial encounter: T88.59XA

## 2018-06-28 LAB — CBC
HCT: 40.8 % (ref 39.0–52.0)
Hemoglobin: 13.5 g/dL (ref 13.0–17.0)
MCH: 27.7 pg (ref 26.0–34.0)
MCHC: 33.1 g/dL (ref 30.0–36.0)
MCV: 83.8 fL (ref 80.0–100.0)
Platelets: 560 10*3/uL — ABNORMAL HIGH (ref 150–400)
RBC: 4.87 MIL/uL (ref 4.22–5.81)
RDW: 12.8 % (ref 11.5–15.5)
WBC: 21.9 10*3/uL — ABNORMAL HIGH (ref 4.0–10.5)
nRBC: 0 % (ref 0.0–0.2)

## 2018-06-28 LAB — POCT I-STAT EG7
Acid-base deficit: 16 mmol/L — ABNORMAL HIGH (ref 0.0–2.0)
Bicarbonate: 11.8 mmol/L — ABNORMAL LOW (ref 20.0–28.0)
Calcium, Ion: 1.26 mmol/L (ref 1.15–1.40)
HCT: 42 % (ref 39.0–52.0)
Hemoglobin: 14.3 g/dL (ref 13.0–17.0)
O2 Saturation: 62 %
Potassium: 4.3 mmol/L (ref 3.5–5.1)
Sodium: 122 mmol/L — ABNORMAL LOW (ref 135–145)
TCO2: 13 mmol/L — ABNORMAL LOW (ref 22–32)
pCO2, Ven: 34.7 mmHg — ABNORMAL LOW (ref 44.0–60.0)
pH, Ven: 7.14 — CL (ref 7.250–7.430)
pO2, Ven: 41 mmHg (ref 32.0–45.0)

## 2018-06-28 LAB — COMPREHENSIVE METABOLIC PANEL
ALT: 10 U/L (ref 0–44)
AST: 13 U/L — ABNORMAL LOW (ref 15–41)
Albumin: 2.9 g/dL — ABNORMAL LOW (ref 3.5–5.0)
Alkaline Phosphatase: 96 U/L (ref 38–126)
BUN: 118 mg/dL — ABNORMAL HIGH (ref 6–20)
CO2: <7 mmol/L — ABNORMAL LOW (ref 22–32)
Calcium: 9 mg/dL (ref 8.9–10.3)
Chloride: 90 mmol/L — ABNORMAL LOW (ref 98–111)
Creatinine, Ser: 6.75 mg/dL — ABNORMAL HIGH (ref 0.61–1.24)
GFR calc Af Amer: 10 mL/min — ABNORMAL LOW (ref >60)
GFR calc non Af Amer: 8 mL/min — ABNORMAL LOW (ref >60)
Glucose, Bld: 798 mg/dL (ref 70–99)
Potassium: 4.6 mmol/L (ref 3.5–5.1)
Sodium: 117 mmol/L — CL (ref 135–145)
Total Bilirubin: 0.9 mg/dL (ref 0.3–1.2)
Total Protein: 8.5 g/dL — ABNORMAL HIGH (ref 6.5–8.1)

## 2018-06-28 LAB — BASIC METABOLIC PANEL
Anion gap: 16 — ABNORMAL HIGH (ref 5–15)
BUN: 109 mg/dL — ABNORMAL HIGH (ref 6–20)
CO2: 10 mmol/L — ABNORMAL LOW (ref 22–32)
Calcium: 9.1 mg/dL (ref 8.9–10.3)
Chloride: 102 mmol/L (ref 98–111)
Creatinine, Ser: 5.86 mg/dL — ABNORMAL HIGH (ref 0.61–1.24)
GFR calc Af Amer: 11 mL/min — ABNORMAL LOW (ref >60)
GFR calc non Af Amer: 10 mL/min — ABNORMAL LOW (ref >60)
Glucose, Bld: 232 mg/dL — ABNORMAL HIGH (ref 70–99)
Potassium: 3.5 mmol/L (ref 3.5–5.1)
Sodium: 128 mmol/L — ABNORMAL LOW (ref 135–145)

## 2018-06-28 LAB — URINALYSIS, ROUTINE W REFLEX MICROSCOPIC
Bilirubin Urine: NEGATIVE
Glucose, UA: >=500 mg/dL — AB
Ketones, ur: NEGATIVE mg/dL
Nitrite: NEGATIVE
Protein, ur: 100 mg/dL — AB
Specific Gravity, Urine: 1.013 (ref 1.005–1.030)
WBC, UA: >50 WBC/hpf — ABNORMAL HIGH (ref 0–5)
pH: 6 (ref 5.0–8.0)

## 2018-06-28 LAB — GLUCOSE, CAPILLARY
Glucose-Capillary: 377 mg/dL — ABNORMAL HIGH (ref 70–99)
Glucose-Capillary: 230 mg/dL — ABNORMAL HIGH (ref 70–99)
Glucose-Capillary: 125 mg/dL — ABNORMAL HIGH (ref 70–99)
Glucose-Capillary: 181 mg/dL — ABNORMAL HIGH (ref 70–99)
Glucose-Capillary: 212 mg/dL — ABNORMAL HIGH (ref 70–99)

## 2018-06-28 LAB — CBG MONITORING, ED
Glucose-Capillary: 589 mg/dL (ref 70–99)
Glucose-Capillary: 589 mg/dL (ref 70–99)

## 2018-06-28 LAB — LIPASE, BLOOD: Lipase: 161 U/L — ABNORMAL HIGH (ref 11–51)

## 2018-06-28 LAB — TSH: TSH: 0.797 u[IU]/mL (ref 0.350–4.500)

## 2018-06-28 MED ORDER — INSULIN REGULAR BOLUS VIA INFUSION
0.0000 [IU] | Freq: Three times a day (TID) | INTRAVENOUS | Status: DC
  Administered 2018-06-29: 6 [IU] via INTRAVENOUS
  Filled 2018-06-28: qty 10

## 2018-06-28 MED ORDER — DEXTROSE 50 % IV SOLN: 25.0000 mL | INTRAVENOUS | Status: DC | PRN

## 2018-06-28 MED ORDER — SODIUM CHLORIDE 0.9 % IV BOLUS
1000.0000 mL | Freq: Once | INTRAVENOUS | Status: AC
  Administered 2018-06-28: 1000 mL via INTRAVENOUS

## 2018-06-28 MED ORDER — INSULIN REGULAR(HUMAN) IN NACL 100-0.9 UT/100ML-% IV SOLN
INTRAVENOUS | Status: DC
  Administered 2018-06-28: 10.6 [IU]/h via INTRAVENOUS

## 2018-06-28 MED ORDER — ONDANSETRON HCL 4 MG PO TABS: 4.0000 mg | ORAL_TABLET | Freq: Four times a day (QID) | ORAL | Status: DC | PRN

## 2018-06-28 MED ORDER — SODIUM CHLORIDE 0.9 % IV SOLN
1.0000 g | INTRAVENOUS | Status: DC
  Administered 2018-06-28 – 2018-07-01 (×4): 1 g via INTRAVENOUS
  Filled 2018-06-28 (×4): qty 10

## 2018-06-28 MED ORDER — ACETAMINOPHEN 650 MG RE SUPP: 650.0000 mg | Freq: Four times a day (QID) | RECTAL | Status: DC | PRN

## 2018-06-28 MED ORDER — DEXTROSE-NACL 5-0.45 % IV SOLN: INTRAVENOUS | Status: DC

## 2018-06-28 MED ORDER — SODIUM CHLORIDE 0.9% FLUSH
3.0000 mL | Freq: Two times a day (BID) | INTRAVENOUS | Status: DC
  Administered 2018-06-29 – 2018-07-02 (×6): 3 mL via INTRAVENOUS

## 2018-06-28 MED ORDER — STERILE WATER FOR INJECTION IV SOLN
Freq: Once | INTRAVENOUS | Status: AC
  Administered 2018-06-28: 19:00:00 via INTRAVENOUS
  Filled 2018-06-28: qty 850

## 2018-06-28 MED ORDER — INSULIN REGULAR BOLUS VIA INFUSION
0.0000 [IU] | Freq: Three times a day (TID) | INTRAVENOUS | Status: DC
  Filled 2018-06-28: qty 10

## 2018-06-28 MED ORDER — ACETAMINOPHEN 325 MG PO TABS
650.0000 mg | ORAL_TABLET | Freq: Four times a day (QID) | ORAL | Status: DC | PRN
  Administered 2018-07-02: 650 mg via ORAL
  Filled 2018-06-28: qty 2

## 2018-06-28 MED ORDER — ONDANSETRON HCL 4 MG/2ML IJ SOLN: 4.0000 mg | Freq: Four times a day (QID) | INTRAMUSCULAR | Status: DC | PRN

## 2018-06-28 MED ORDER — DEXTROSE-NACL 5-0.45 % IV SOLN
INTRAVENOUS | Status: DC
  Administered 2018-06-28 – 2018-06-29 (×2): via INTRAVENOUS

## 2018-06-28 MED ORDER — HEPARIN SODIUM (PORCINE) 5000 UNIT/ML IJ SOLN: 5000.0000 [IU] | Freq: Three times a day (TID) | INTRAMUSCULAR | Status: DC

## 2018-06-28 MED ORDER — SODIUM CHLORIDE 0.9 % IV SOLN
INTRAVENOUS | Status: DC
  Administered 2018-06-28: 16:00:00 via INTRAVENOUS

## 2018-06-28 MED ORDER — PANTOPRAZOLE SODIUM 40 MG IV SOLR
40.0000 mg | Freq: Two times a day (BID) | INTRAVENOUS | Status: DC
  Administered 2018-06-28 – 2018-06-30 (×5): 40 mg via INTRAVENOUS
  Filled 2018-06-28 (×5): qty 40

## 2018-06-28 MED ORDER — INSULIN REGULAR(HUMAN) IN NACL 100-0.9 UT/100ML-% IV SOLN
INTRAVENOUS | Status: DC
  Administered 2018-06-28: 5.3 [IU]/h via INTRAVENOUS
  Filled 2018-06-28: qty 100

## 2018-06-28 MED ORDER — SODIUM CHLORIDE 0.9 % IV SOLN
INTRAVENOUS | Status: DC
  Administered 2018-06-28: 15:00:00 via INTRAVENOUS

## 2018-06-28 MED ORDER — ONDANSETRON HCL 4 MG/2ML IJ SOLN
4.0000 mg | Freq: Once | INTRAMUSCULAR | Status: AC
  Administered 2018-06-28: 4 mg via INTRAVENOUS
  Filled 2018-06-28: qty 2

## 2018-06-28 NOTE — H&P (Signed)
History and Physical    Timothy Madden QPR:[id number] DOB: 04-22-1960 DOA: 06/28/2018  Referring MD/NP/PA: Jola Schmidt, MD PCP: Marliss Coots, NP  Patient coming from: Home  Chief Complaint: Nausea, vomiting, diarrhea  I have personally briefly reviewed patient's old medical records in [place]   HPI: Timothy Madden is a 58 y.o. male with medical history significant of diabetes mellitus; who presents with complaints of nausea, vomiting, and diarrhea for the last 5 days.  Patient is somewhat of a poor historian.  He reports that he has been out of medications of metformin due to financial reasons and has no primary care provider.  Reports having 3 or more episodes of emesis and diarrhea per day that are nonbloody in appearance.  Associated symptoms of subjective fever, chills, generalized weakness and intermittent cough.  Due to his symptoms he is not been able to eat or drink very much.  Denies having any significant shortness of breath, abdominal pain, dysuria, or difficulty voiding.  He does not have a primary care provider and states that he lives with his mother.  He admits to smoking approximately 5 cigarettes a day  ED Course: Upon admission into the emergency department patient was seen to be afebrile with vital signs relatively within normal limits.  Labs revealed WBC 21.9, platelets 560, sodium 117, potassium 4.6, CO2<7, BUN 118, creatinine 6.75,  glucose 798, and anion gap unable to be calculated.  Venous blood gas revealed pH 7.14.  Patient was started on glucose stabilizer protocol.  Patient was noted to have 985m of urine present on bladder scan and therefore Foley catheter was placed.  TRH called to admit.   Review of Systems  Constitutional: Positive for fever and malaise/fatigue.  HENT: Negative for ear discharge and nosebleeds.   Eyes: Negative for double vision and photophobia.  Respiratory: Positive for cough.   Cardiovascular: Negative for chest pain and  leg swelling.  Gastrointestinal: Positive for diarrhea, nausea and vomiting. Negative for abdominal pain and blood in stool.  Genitourinary: Negative for dysuria and hematuria.  Musculoskeletal: Negative for falls.  Neurological: Positive for weakness. Negative for focal weakness.  Psychiatric/Behavioral: Negative for memory loss and substance abuse.    Past Medical History:  Diagnosis Date  . Amputation of finger, left   . Diabetes mellitus without complication ([facility]     Past Surgical History:  Procedure Laterality Date  . I&D EXTREMITY Left 10/26/2013   Procedure: IRRIGATION AND DEBRIDEMENT EXTREMITY,PARTIAL AMPUTATION LEFT INDEX FINGER;  Surgeon: HDayna Barker MD;  [place]  Service: Plastics;  Laterality: Left;  . I&D EXTREMITY Left 10/30/2013   Procedure: IRRIGATION AND DEBRIDEMENT AND REVISION AMPUTATION OF LEFT INDEX FINGER;  Surgeon: HDayna Barker MD;  [place] [facility]  Service: Plastics;  Laterality: Left;     reports that he has been smoking. He has been smoking about 1.00 pack per day. His smokeless tobacco use includes chew. He reports that he does not drink alcohol or use drugs.  No Known Allergies  Family History  Problem Relation Age of Onset  . Hypertension Mother   . Diabetes Father   . Diabetes Sister   . Diabetes Brother   . Heart disease Brother     Prior to Admission medications   Medication Sig Start Date End Date Taking? Authorizing Provider  glucose monitoring kit (FREESTYLE) monitoring kit 1 each by Does not apply route as needed for other. Dispense any model that is covered- dispense testing supplies for Q AC/  HS accuchecks- 1 month supply with one refil. Patient not taking: Reported on 06/28/2018 11/02/13   Debbe Odea, MD  insulin aspart (NOVOLOG FLEXPEN) 100 UNIT/ML FlexPen Inject 2 Units into the skin 3 (three) times daily with meals. Patient not taking: Reported on 06/28/2018 11/02/13   Debbe Odea, MD  Insulin Glargine (LANTUS  SOLOSTAR) 100 UNIT/ML Solostar Pen Inject 20 Units into the skin 2 (two) times daily. Patient not taking: Reported on 06/28/2018 11/02/13   Debbe Odea, MD  Insulin Pen Needle (NOVOFINE) 30G X 8 MM MISC Inject 10 each into the skin 4 (four) times daily. Patient not taking: Reported on 06/28/2018 11/02/13   Debbe Odea, MD  metFORMIN (GLUCOPHAGE) 500 MG tablet Take 1 tablet (500 mg total) by mouth daily with breakfast. Patient not taking: Reported on 06/28/2018 06/29/15   Kalman Drape, PA  Vitamin D, Ergocalciferol, (DRISDOL) 50000 UNITS CAPS capsule Take 1 capsule (50,000 Units total) by mouth every 7 (seven) days. Patient not taking: Reported on 06/29/2015 11/14/13   Lorayne Marek, MD    Physical Exam:  Constitutional: Elderly looking disheveled male who not appear to be in any acute distress Vitals:   06/28/18 1446 06/28/18 1502 06/28/18 1511 06/28/18 1515  BP: (!) 134/91  127/87 124/87  Pulse:   91 87  Resp:   16 14  Temp:      TempSrc:      SpO2:   98% 93%  Weight:  79.3 kg    Height:  '6\' 4"'  (1.93 m)     Eyes: PERRL, lids and conjunctivae normal ENMT: Mucous membranes are dry. Posterior pharynx clear of any exudate or lesions. Poor dentition with multiple missing teeth Neck: normal, supple, no masses, no thyromegaly Respiratory: clear to auscultation bilaterally, no wheezing, no crackles. Normal respiratory effort. No accessory muscle use.  Cardiovascular: Regular rate and rhythm, no murmurs / rubs / gallops. No extremity edema. 2+ pedal pulses. No carotid bruits.  Abdomen: no tenderness, no masses palpated. No hepatosplenomegaly. Bowel sounds positive.  Musculoskeletal: no clubbing / cyanosis.  Amputation of the left second digit. Good ROM, no contractures. Normal muscle tone.  Skin: Patient's socks were matted to his feet.  No significant areas of erythema appreciated Neurologic: CN 2-12 grossly intact. Sensation intact, DTR normal. Strength 5/5 in all 4.  Psychiatric:   Alert and oriented x 3. Normal mood.     Labs on Admission: I have personally reviewed following labs and imaging studies  CBC: Recent Labs  Lab 06/28/18 1318 06/28/18 1558  WBC 21.9*  --   HGB 13.5 14.3  HCT 40.8 42.0  MCV 83.8  --   PLT 560*  --    Basic Metabolic Panel: Recent Labs  Lab 06/28/18 1318 06/28/18 1558  NA 117* 122*  K 4.6 4.3  CL 90*  --   CO2 <7*  --   GLUCOSE 798*  --   BUN 118*  --   CREATININE 6.75*  --   CALCIUM 9.0  --    GFR: Estimated Creatinine Clearance: 13.5 mL/min (A) (by C-G formula based on SCr of 6.75 mg/dL (H)). Liver Function Tests: Recent Labs  Lab 06/28/18 1318  AST 13*  ALT 10  ALKPHOS 96  BILITOT 0.9  PROT 8.5*  ALBUMIN 2.9*   Recent Labs  Lab 06/28/18 1318  LIPASE 161*   No results for input(s): AMMONIA in the last 168 hours. Coagulation Profile: No results for input(s): INR, PROTIME in the last 168 hours. Cardiac Enzymes:  No results for input(s): CKTOTAL, CKMB, CKMBINDEX, TROPONINI in the last 168 hours. BNP (last 3 results) No results for input(s): PROBNP in the last 8760 hours. HbA1C: No results for input(s): HGBA1C in the last 72 hours. CBG: Recent Labs  Lab 06/28/18 1501  GLUCAP 589*   Lipid Profile: No results for input(s): CHOL, HDL, LDLCALC, TRIG, CHOLHDL, LDLDIRECT in the last 72 hours. Thyroid Function Tests: No results for input(s): TSH, T4TOTAL, FREET4, T3FREE, THYROIDAB in the last 72 hours. Anemia Panel: No results for input(s): VITAMINB12, FOLATE, FERRITIN, TIBC, IRON, RETICCTPCT in the last 72 hours. Urine analysis:    Component Value Date/Time   COLORURINE YELLOW 10/26/2013 0034   APPEARANCEUR CLEAR 10/26/2013 0034   LABSPEC 1.016 10/26/2013 0034   PHURINE 5.5 10/26/2013 0034   GLUCOSEU >1000 (A) 10/26/2013 0034   HGBUR SMALL (A) 10/26/2013 0034   BILIRUBINUR NEGATIVE 10/26/2013 0034   KETONESUR NEGATIVE 10/26/2013 0034   PROTEINUR NEGATIVE 10/26/2013 0034   UROBILINOGEN 0.2  10/26/2013 0034   NITRITE NEGATIVE 10/26/2013 0034   LEUKOCYTESUR NEGATIVE 10/26/2013 0034   Sepsis Labs: No results found for this or any previous visit (from the past 240 hour(s)).   Radiological Exams on Admission: No results found.  EKG: Independently reviewed.   Assessment/Plan Suspect diabetic ketoacidosis, type II: Acute.  Patient found to have glucose of 798 with sodium bicarb <7 and anion gap unable to be calculated.  Patient was noted to be significantly acidotic with venous pH 7.14.  Urinalysis after pain did not note any signs of ketones although patient noted nausea, vomiting, and diarrhea. - Admit to stepdown unit  - Glucose stabilizer protocol initiated  - Sodium bicarb drip at 100 mL/h x 1 L - Serial BMPs, hemoglobin A1c in a.m.  - Correct electrolytes as needed - Monitoring for AG closure and will transition to subcutaneous insulin once able - Care management consult for need of PCP and barriers of - Diabetes education consult in a.m.  Urinary tract infection with hematuria, urinary retention:   Patient found to have greater than 999 mL. Urinalysis positive for large leukocytes, > 500 glucose, few bacteria,> 50 WBCs. -Follow-up urine culture -Continue Foley cath -Rocephin IV  Acute renal failure superimposed kidney disease stage III: Patient presents with a creatinine of 6.75 with BUN 118.  Patient's creatinine last noted to be 1.79 back in 06/2015.  At this time he is still able to make urine. -Check renal ultrasound -Continue IV fluids  -Continue to monitor kidney function -Formally consult nephrology if needed  Nausea, vomiting, diarrhea: Question possibility of acute gastroenteritis. -Monitor intake and output -Protonix IV -Antiemetics as needed -Advance diet as tolerated  Hyponatremia: Sodium levels within normal limits when corrected for hyperglycemia  Elevated lipase: Acute.  Lipase elevated at 161 on admission.  Patient denies any complaints of  abdominal pain. -Continue to monitor  History of osteomyelitis: Patient status post amputation of left second digit due to previous history of osteomyelitis in 2015.  Tobacco use: Patient reports smoking approximately 5 cigarettes/day on average  DVT prophylaxis: Heparin Code Status: Full Family Communication: Patient's mother not available byphone Disposition Plan: Likely discharge home in 2 to 3 days Consults called: None Admission status: Inpatient  Norval Morton MD [facility] Pager [phone]   If 7PM-7AM, please contact night-coverage [email or web address] Password [facility]  06/28/2018, 4:07 PM

## 2018-06-28 NOTE — Progress Notes (Signed)
Patient was initially noted to have urinary retention with yellow urine after Foley was placed.  However, blood was noted in the urinary catheter.  Held heparin and placed on SCD.  Follow-up renal ultrasound.

## 2018-06-28 NOTE — Progress Notes (Signed)
Timothy Madden is a 58 y.o. male patient admitted from ED awake, alert - oriented  X 4 - no acute distress noted.  VSS - Blood pressure 129/83, pulse 97, temperature 98 F (36.7 C), temperature source Oral, resp. rate 17, height 6\' 4"  (1.93 m), weight 79.3 kg, SpO2 100 %.    IV in place, occlusive dsg intact without redness.  Orientation to room, and floor completed with information packet given to patient/family.  Patient declined safety video at this time.  Admission INP armband ID verified with patient/family, and in place.   SR up x 2, fall assessment complete, with patient and family able to verbalize understanding of risk associated with falls, and verbalized understanding to call nsg before up out of bed.  Call light within reach, patient able to voice, and demonstrate understanding.  Skin, clean-dry-with abrasion on right legg, andcracking in between toes, pt noted to have eschar on the bottom of right foot.pt noted to have blood in foley cathter Dr. Tamala Julian noified . Will cont to eval and treat per MD orders.  Luci Bank, RN 06/28/2018 6:47 PM

## 2018-06-28 NOTE — ED Triage Notes (Signed)
Pt reports he was taken to the ER by his mother due to nausea- denies pain denies n/v/d.

## 2018-06-28 NOTE — ED Notes (Signed)
Date and time results received: 06/28/18 1443 (use smartphrase ".now" to insert current time)  Test: sodium and glucose Critical Value: 117 and 798  Name of Provider Notified: Dr. Venora Maples  Orders Received? Or Actions Taken?: Orders Received - See Orders for details

## 2018-06-28 NOTE — ED Provider Notes (Signed)
[facility] EMERGENCY DEPARTMENT Provider Note   CSN: [id number] Arrival date & time: 06/28/18  1249     History   Chief Complaint Chief Complaint  Patient presents with  . Nausea    HPI Timothy Madden is a 58 y.o. male.     HPI 58 year old male presents the emergency department complaints of nausea vomiting diarrhea and decreased oral intake over the past 5 days.  He has been out of his metformin.  He denies sick contacts.  Denies fevers and chills.  No blood in his vomit or stool.  Reports decreased oral intake.  Unable to keep his medicines down.  He feels weak and dehydrated at this time.  Symptoms are moderate to severe in severity.    Past Medical History:  Diagnosis Date  . Amputation of finger, left   . Diabetes mellitus without complication [facility])     Patient Active Problem List   Diagnosis Date Noted  . Osteomyelitis of finger of left hand ([place]) 10/26/2013  . Osteomyelitis ([place]) 10/26/2013  . Sepsis ([place]) 10/26/2013  . AKI (acute kidney injury) ([place]) 10/26/2013  . Hyponatremia 10/26/2013  . Type I diabetes mellitus with complication, uncontrolled ([place]) 10/26/2013  . Essential hypertension 10/26/2013  . Anemia of chronic disease 10/26/2013  . PSYCHIATRIC DISORDER 07/19/2006  . TOBACCO ABUSE 07/19/2006  . DIABETES MELLITUS, TYPE II 11/30/2005  . ANXIETY 11/30/2005    Past Surgical History:  Procedure Laterality Date  . I&D EXTREMITY Left 10/26/2013   Procedure: IRRIGATION AND DEBRIDEMENT EXTREMITY,PARTIAL AMPUTATION LEFT INDEX FINGER;  Surgeon: Dayna Barker, MD;  Location: [place];  Service: Plastics;  Laterality: Left;  . I&D EXTREMITY Left 10/30/2013   Procedure: IRRIGATION AND DEBRIDEMENT AND REVISION AMPUTATION OF LEFT INDEX FINGER;  Surgeon: Dayna Barker, MD;  Location: [place];  Service: Plastics;  Laterality: Left;        Home Medications    Prior to Admission medications   Medication Sig Start Date End Date Taking?  Authorizing Provider  glucose monitoring kit (FREESTYLE) monitoring kit 1 each by Does not apply route as needed for other. Dispense any model that is covered- dispense testing supplies for Q AC/ HS accuchecks- 1 month supply with one refil. Patient not taking: Reported on 06/28/2018 11/02/13   Debbe Odea, MD  insulin aspart (NOVOLOG FLEXPEN) 100 UNIT/ML FlexPen Inject 2 Units into the skin 3 (three) times daily with meals. Patient not taking: Reported on 06/28/2018 11/02/13   Debbe Odea, MD  Insulin Glargine (LANTUS SOLOSTAR) 100 UNIT/ML Solostar Pen Inject 20 Units into the skin 2 (two) times daily. Patient not taking: Reported on 06/28/2018 11/02/13   Debbe Odea, MD  Insulin Pen Needle (NOVOFINE) 30G X 8 MM MISC Inject 10 each into the skin 4 (four) times daily. Patient not taking: Reported on 06/28/2018 11/02/13   Debbe Odea, MD  metFORMIN (GLUCOPHAGE) 500 MG tablet Take 1 tablet (500 mg total) by mouth daily with breakfast. Patient not taking: Reported on 06/28/2018 06/29/15   Kalman Drape, PA  Vitamin D, Ergocalciferol, (DRISDOL) 50000 UNITS CAPS capsule Take 1 capsule (50,000 Units total) by mouth every 7 (seven) days. Patient not taking: Reported on 06/29/2015 11/14/13   Lorayne Marek, MD    Family History Family History  Problem Relation Age of Onset  . Hypertension Mother   . Diabetes Father   . Diabetes Sister   . Diabetes Brother   . Heart disease Brother     Social History Social History  Tobacco Use  . Smoking status: Current Every Day Smoker    Packs/day: 1.00  . Smokeless tobacco: Current User    Types: Chew  Substance Use Topics  . Alcohol use: No    Alcohol/week: 0.0 standard drinks  . Drug use: No     Allergies   Patient has no known allergies.   Review of Systems Review of Systems  All other systems reviewed and are negative.    Physical Exam Updated Vital Signs BP (!) 144/98 (BP Location: Right Arm)   Pulse (!) 101   Temp 98 F  (36.7 C) (Oral)   Resp 18   SpO2 100%   Physical Exam Vitals signs and nursing note reviewed.  Constitutional:      Appearance: He is well-developed.  HENT:     Head: Normocephalic and atraumatic.  Neck:     Musculoskeletal: Normal range of motion.  Cardiovascular:     Rate and Rhythm: Normal rate and regular rhythm.     Heart sounds: Normal heart sounds.  Pulmonary:     Effort: Pulmonary effort is normal. No respiratory distress.     Breath sounds: Normal breath sounds.  Abdominal:     General: There is no distension.     Palpations: Abdomen is soft.     Tenderness: There is no abdominal tenderness.  Musculoskeletal: Normal range of motion.  Skin:    General: Skin is warm and dry.  Neurological:     Mental Status: He is alert and oriented to person, place, and time.  Psychiatric:        Judgment: Judgment normal.      ED Treatments / Results  Labs (all labs ordered are listed, but only abnormal results are displayed) Labs Reviewed  CBC - Abnormal; Notable for the following components:      Result Value   WBC 21.9 (*)    Platelets 560 (*)    All other components within normal limits  COMPREHENSIVE METABOLIC PANEL - Abnormal; Notable for the following components:   Sodium 117 (*)    Chloride 90 (*)    CO2 <7 (*)    Glucose, Bld 798 (*)    BUN 118 (*)    Creatinine, Ser 6.75 (*)    Total Protein 8.5 (*)    Albumin 2.9 (*)    AST 13 (*)    GFR calc non Af Amer 8 (*)    GFR calc Af Amer 10 (*)    All other components within normal limits  LIPASE, BLOOD - Abnormal; Notable for the following components:   Lipase 161 (*)    All other components within normal limits    EKG    Radiology No results found.  Procedures .Critical Care Performed by: Jola Schmidt, MD Authorized by: Jola Schmidt, MD   Critical care provider statement:    Critical care time (minutes):  32   Critical care was time spent personally by me on the following activities:   Discussions with consultants, evaluation of patient's response to treatment, examination of patient, ordering and performing treatments and interventions, ordering and review of laboratory studies, ordering and review of radiographic studies, pulse oximetry, re-evaluation of patient's condition, obtaining history from patient or surrogate and review of old charts   (including critical care time)  Medications Ordered in ED Medications  dextrose 5 %-0.45 % sodium chloride infusion (has no administration in time range)  insulin regular bolus via infusion 0-10 Units (has no administration in time range)  insulin  regular, human (MYXREDLIN) 100 units/ 100 mL infusion (has no administration in time range)  dextrose 50 % solution 25 mL (has no administration in time range)  0.9 %  sodium chloride infusion (has no administration in time range)  sodium chloride 0.9 % bolus 1,000 mL (1,000 mLs Intravenous New Bag/Given 06/28/18 1323)  ondansetron (ZOFRAN) injection 4 mg (4 mg Intravenous Given 06/28/18 1327)     Initial Impression / Assessment and Plan / ED Course  I have reviewed the triage vital signs and the nursing notes.  Pertinent labs & imaging results that were available during my care of the patient were reviewed by me and considered in my medical decision making (see chart for details).        Nausea vomiting diarrhea.  Dehydration.  Hyperglycemia found with blood sugar greater than 700.  Patient appears to be in acute renal failure as well with a new creatinine today of 6.75.  Baseline creatinine is approximately 1.8-2.1.      Final Clinical Impressions(s) / ED Diagnoses   Final diagnoses:  AKI (acute kidney injury) ([place])  Dehydration  Hyperglycemia    ED Discharge Orders    None       Jola Schmidt, MD 06/28/18 1452

## 2018-06-28 NOTE — ED Notes (Signed)
CBG 589

## 2018-06-28 NOTE — ED Notes (Signed)
Bladder scan read 956ml

## 2018-06-28 NOTE — ED Notes (Signed)
ED TO INPATIENT HANDOFF REPORT  ED Nurse Name and Phone #: Courtenay Hirth [phone]  S Name/Age/Gender Timothy Madden 58 y.o. male Room/Bed: TRAAC/TRAAC  Code Status   Code Status: Full Code  Home/SNF/Other Home Patient oriented to: self, place, time and situation Is this baseline? Yes   Triage Complete: Triage complete  Chief Complaint Nausea with diabetes  Triage Note Pt reports he was taken to the ER by his mother due to nausea- denies pain denies n/v/d.    Allergies No Known Allergies  Level of Care/Admitting Diagnosis ED Disposition    ED Disposition Condition [place] [facility] Area: [place] [[id number]]  Level of Care: Progressive [102]  Covid Evaluation: Screening Protocol (No Symptoms)  Diagnosis: DKA, type 2 [facility]) [[id number]]  Admitting Physician: Norval Morton [[id number]]  Attending Physician: Norval Morton [[id number]]  Estimated length of stay: past midnight tomorrow  Certification:: I certify this patient will need inpatient services for at least 2 midnights  PT Class (Do Not Modify): Inpatient [101]  PT Acc Code (Do Not Modify): Private [1]       B Medical/Surgery History Past Medical History:  Diagnosis Date  . Amputation of finger, left   . Diabetes mellitus without complication [facility])    Past Surgical History:  Procedure Laterality Date  . I&D EXTREMITY Left 10/26/2013   Procedure: IRRIGATION AND DEBRIDEMENT EXTREMITY,PARTIAL AMPUTATION LEFT INDEX FINGER;  Surgeon: Dayna Barker, MD;  Location: [place];  Service: Plastics;  Laterality: Left;  . I&D EXTREMITY Left 10/30/2013   Procedure: IRRIGATION AND DEBRIDEMENT AND REVISION AMPUTATION OF LEFT INDEX FINGER;  Surgeon: Dayna Barker, MD;  Location: [place];  Service: Plastics;  Laterality: Left;     A IV Location/Drains/Wounds Patient Lines/Drains/Airways Status   Active Line/Drains/Airways    Name:   Placement date:   Placement time:   Site:   Days:   Peripheral IV 06/28/18  Left Antecubital   06/28/18    1323    Antecubital   less than 1   Peripheral IV 06/28/18 Left Forearm   06/28/18    1552    Forearm   less than 1   Urethral Catheter Connor EMT Non-latex 16 Fr.   06/28/18    1555    Non-latex   less than 1          Intake/Output Last 24 hours  Intake/Output Summary (Last 24 hours) at 06/28/2018 1629 Last data filed at 06/28/2018 1628 Gross per 24 hour  Intake 1000 ml  Output 1700 ml  Net -700 ml    Labs/Imaging Results for orders placed or performed during the hospital encounter of 06/28/18 (from the past 48 hour(s))  CBC     Status: Abnormal   Collection Time: 06/28/18  1:18 PM  Result Value Ref Range   WBC 21.9 (H) 4.0 - 10.5 K/uL   RBC 4.87 4.22 - 5.81 MIL/uL   Hemoglobin 13.5 13.0 - 17.0 g/dL   HCT 40.8 39.0 - 52.0 %   MCV 83.8 80.0 - 100.0 fL   MCH 27.7 26.0 - 34.0 pg   MCHC 33.1 30.0 - 36.0 g/dL   RDW 12.8 11.5 - 15.5 %   Platelets 560 (H) 150 - 400 K/uL   nRBC 0.0 0.0 - 0.2 %    Comment: Performed at [facility], [address], [place], [place] [address]  Comprehensive metabolic panel     Status: Abnormal   Collection Time: 06/28/18  1:18 PM  Result Value Ref Range   Sodium 117 (LL) 135 - 145 mmol/L    Comment: CRITICAL RESULT CALLED TO, READ BACK BY AND VERIFIED WITH: T Marshaun Lortie RN AT 1441 ON [id number] BY K FORSYTH    Potassium 4.6 3.5 - 5.1 mmol/L   Chloride 90 (L) 98 - 111 mmol/L   CO2 <7 (L) 22 - 32 mmol/L   Glucose, Bld 798 (HH) 70 - 99 mg/dL    Comment: CRITICAL RESULT CALLED TO, READ BACK BY AND VERIFIED WITH: T Jorgia Manthei RN AT 1441 ON [id number] BY K FORSYTH    BUN 118 (H) 6 - 20 mg/dL   Creatinine, Ser 6.75 (H) 0.61 - 1.24 mg/dL   Calcium 9.0 8.9 - 10.3 mg/dL   Total Protein 8.5 (H) 6.5 - 8.1 g/dL   Albumin 2.9 (L) 3.5 - 5.0 g/dL   AST 13 (L) 15 - 41 U/L   ALT 10 0 - 44 U/L   Alkaline Phosphatase 96 38 - 126 U/L   Total Bilirubin 0.9 0.3 - 1.2 mg/dL   GFR calc non Af Amer 8 (L) >60 mL/min   GFR calc Af Amer 10  (L) >60 mL/min   Anion gap NOT CALCULATED 5 - 15    Comment: Performed at [facility] [address], [place], [place] [address]  Lipase, blood     Status: Abnormal   Collection Time: 06/28/18  1:18 PM  Result Value Ref Range   Lipase 161 (H) 11 - 51 U/L    Comment: Performed at [facility], [place] [address] [place], [place] [address]  CBG monitoring, ED     Status: Abnormal   Collection Time: 06/28/18  3:01 PM  Result Value Ref Range   Glucose-Capillary 589 (HH) 70 - 99 mg/dL   Comment 1 Document in Chart   POCT I-Stat EG7     Status: Abnormal   Collection Time: 06/28/18  3:58 PM  Result Value Ref Range   pH, Ven 7.140 (LL) 7.250 - 7.430   pCO2, Ven 34.7 (L) 44.0 - 60.0 mmHg   pO2, Ven 41.0 32.0 - 45.0 mmHg   Bicarbonate 11.8 (L) 20.0 - 28.0 mmol/L   TCO2 13 (L) 22 - 32 mmol/L   O2 Saturation 62.0 %   Acid-base deficit 16.0 (H) 0.0 - 2.0 mmol/L   Sodium 122 (L) 135 - 145 mmol/L   Potassium 4.3 3.5 - 5.1 mmol/L   Calcium, Ion 1.26 1.15 - 1.40 mmol/L   HCT 42.0 39.0 - 52.0 %   Hemoglobin 14.3 13.0 - 17.0 g/dL   Patient temperature HIDE    Sample type VENOUS    Comment NOTIFIED PHYSICIAN   CBG monitoring, ED     Status: Abnormal   Collection Time: 06/28/18  4:15 PM  Result Value Ref Range   Glucose-Capillary 589 (HH) 70 - 99 mg/dL   Comment 1 Document in Chart    No results found.  Pending Labs Unresulted Labs (From admission, onward)    Start     Ordered   06/29/18 0500  CBC  Tomorrow morning,   R     06/28/18 1615   06/28/18 2202  Basic metabolic panel  Now then every 4 hours,   R (with STAT occurrences)     06/28/18 1615   06/28/18 1629  Urine Culture  Add-on,   AD     06/28/18 1629   06/28/18 1618  Urinalysis, Routine w reflex microscopic  ONCE - STAT,  STAT     06/28/18 1617   06/28/18 1612  HIV antibody (Routine Testing)  Once,   STAT     06/28/18 1615   06/28/18 1516  Novel Coronavirus,NAA,(SEND-OUT TO REF LAB - TAT 24-48 hrs); Hosp Order   (Asymptomatic Patients Labs)  Once,   STAT    Question:  Rule Out  Answer:  Yes   06/28/18 1515          Vitals/Pain Today's Vitals   06/28/18 1446 06/28/18 1502 06/28/18 1511 06/28/18 1515  BP: (!) 134/91  127/87 124/87  Pulse:   91 87  Resp:   16 14  Temp:      TempSrc:      SpO2:   98% 93%  Weight:  79.3 kg    Height:  6\' 4"  (1.93 m)    PainSc:        Isolation Precautions No active isolations  Medications Medications  dextrose 5 %-0.45 % sodium chloride infusion (has no administration in time range)  insulin regular bolus via infusion 0-10 Units (has no administration in time range)  insulin regular, human (MYXREDLIN) 100 units/ 100 mL infusion (10.6 Units/hr Intravenous New Bag/Given 06/28/18 1617)  dextrose 50 % solution 25 mL (has no administration in time range)  heparin injection 5,000 Units (has no administration in time range)  sodium chloride flush (NS) 0.9 % injection 3 mL (has no administration in time range)  acetaminophen (TYLENOL) tablet 650 mg (has no administration in time range)    Or  acetaminophen (TYLENOL) suppository 650 mg (has no administration in time range)  ondansetron (ZOFRAN) tablet 4 mg (has no administration in time range)    Or  ondansetron (ZOFRAN) injection 4 mg (has no administration in time range)  0.9 %  sodium chloride infusion ( Intravenous New Bag/Given 06/28/18 1618)  sodium bicarbonate 150 mEq in sterile water 1,000 mL infusion (has no administration in time range)  sodium chloride 0.9 % bolus 1,000 mL (0 mLs Intravenous Stopped 06/28/18 1447)  ondansetron (ZOFRAN) injection 4 mg (4 mg Intravenous Given 06/28/18 1327)    Mobility walks with device Low fall risk   Focused Assessments Gastrointestinal   R Recommendations: See Admitting Provider Note  Report given to:   Additional Notes:

## 2018-06-29 ENCOUNTER — Encounter (HOSPITAL_COMMUNITY): Payer: Self-pay | Admitting: General Practice

## 2018-06-29 DIAGNOSIS — N179 Acute kidney failure, unspecified: Secondary | ICD-10-CM

## 2018-06-29 DIAGNOSIS — E1110 Type 2 diabetes mellitus with ketoacidosis without coma: Principal | ICD-10-CM

## 2018-06-29 DIAGNOSIS — D72829 Elevated white blood cell count, unspecified: Secondary | ICD-10-CM

## 2018-06-29 DIAGNOSIS — N390 Urinary tract infection, site not specified: Secondary | ICD-10-CM

## 2018-06-29 DIAGNOSIS — N183 Chronic kidney disease, stage 3 (moderate): Secondary | ICD-10-CM

## 2018-06-29 LAB — CBC
HCT: 34.3 % — ABNORMAL LOW (ref 39.0–52.0)
Hemoglobin: 12.2 g/dL — ABNORMAL LOW (ref 13.0–17.0)
MCH: 27.9 pg (ref 26.0–34.0)
MCHC: 35.6 g/dL (ref 30.0–36.0)
MCV: 78.5 fL — ABNORMAL LOW (ref 80.0–100.0)
Platelets: 557 10*3/uL — ABNORMAL HIGH (ref 150–400)
RBC: 4.37 MIL/uL (ref 4.22–5.81)
RDW: 12.3 % (ref 11.5–15.5)
WBC: 24.9 10*3/uL — ABNORMAL HIGH (ref 4.0–10.5)
nRBC: 0 % (ref 0.0–0.2)

## 2018-06-29 LAB — BASIC METABOLIC PANEL
Anion gap: 11 (ref 5–15)
Anion gap: 13 (ref 5–15)
Anion gap: 15 (ref 5–15)
BUN: 74 mg/dL — ABNORMAL HIGH (ref 6–20)
BUN: 90 mg/dL — ABNORMAL HIGH (ref 6–20)
BUN: 99 mg/dL — ABNORMAL HIGH (ref 6–20)
CO2: 12 mmol/L — ABNORMAL LOW (ref 22–32)
CO2: 14 mmol/L — ABNORMAL LOW (ref 22–32)
CO2: 18 mmol/L — ABNORMAL LOW (ref 22–32)
Calcium: 7.7 mg/dL — ABNORMAL LOW (ref 8.9–10.3)
Calcium: 8.4 mg/dL — ABNORMAL LOW (ref 8.9–10.3)
Calcium: 8.7 mg/dL — ABNORMAL LOW (ref 8.9–10.3)
Chloride: 100 mmol/L (ref 98–111)
Chloride: 104 mmol/L (ref 98–111)
Chloride: 105 mmol/L (ref 98–111)
Creatinine, Ser: 4.26 mg/dL — ABNORMAL HIGH (ref 0.61–1.24)
Creatinine, Ser: 4.96 mg/dL — ABNORMAL HIGH (ref 0.61–1.24)
Creatinine, Ser: 5.26 mg/dL — ABNORMAL HIGH (ref 0.61–1.24)
GFR calc Af Amer: 13 mL/min — ABNORMAL LOW (ref >60)
GFR calc Af Amer: 14 mL/min — ABNORMAL LOW (ref >60)
GFR calc Af Amer: 17 mL/min — ABNORMAL LOW (ref >60)
GFR calc non Af Amer: 11 mL/min — ABNORMAL LOW (ref >60)
GFR calc non Af Amer: 12 mL/min — ABNORMAL LOW (ref >60)
GFR calc non Af Amer: 14 mL/min — ABNORMAL LOW (ref >60)
Glucose, Bld: 112 mg/dL — ABNORMAL HIGH (ref 70–99)
Glucose, Bld: 181 mg/dL — ABNORMAL HIGH (ref 70–99)
Glucose, Bld: 339 mg/dL — ABNORMAL HIGH (ref 70–99)
Potassium: 3.1 mmol/L — ABNORMAL LOW (ref 3.5–5.1)
Potassium: 3.1 mmol/L — ABNORMAL LOW (ref 3.5–5.1)
Potassium: 3.2 mmol/L — ABNORMAL LOW (ref 3.5–5.1)
Sodium: 129 mmol/L — ABNORMAL LOW (ref 135–145)
Sodium: 131 mmol/L — ABNORMAL LOW (ref 135–145)
Sodium: 132 mmol/L — ABNORMAL LOW (ref 135–145)

## 2018-06-29 LAB — GLUCOSE, CAPILLARY
Glucose-Capillary: 120 mg/dL — ABNORMAL HIGH (ref 70–99)
Glucose-Capillary: 124 mg/dL — ABNORMAL HIGH (ref 70–99)
Glucose-Capillary: 126 mg/dL — ABNORMAL HIGH (ref 70–99)
Glucose-Capillary: 136 mg/dL — ABNORMAL HIGH (ref 70–99)
Glucose-Capillary: 148 mg/dL — ABNORMAL HIGH (ref 70–99)
Glucose-Capillary: 169 mg/dL — ABNORMAL HIGH (ref 70–99)
Glucose-Capillary: 185 mg/dL — ABNORMAL HIGH (ref 70–99)
Glucose-Capillary: 186 mg/dL — ABNORMAL HIGH (ref 70–99)
Glucose-Capillary: 200 mg/dL — ABNORMAL HIGH (ref 70–99)
Glucose-Capillary: 205 mg/dL — ABNORMAL HIGH (ref 70–99)
Glucose-Capillary: 225 mg/dL — ABNORMAL HIGH (ref 70–99)
Glucose-Capillary: 305 mg/dL — ABNORMAL HIGH (ref 70–99)
Glucose-Capillary: 326 mg/dL — ABNORMAL HIGH (ref 70–99)
Glucose-Capillary: 93 mg/dL (ref 70–99)
Glucose-Capillary: 97 mg/dL (ref 70–99)

## 2018-06-29 LAB — NOVEL CORONAVIRUS, NAA (HOSP ORDER, SEND-OUT TO REF LAB; TAT 18-24 HRS): SARS-CoV-2, NAA: NOT DETECTED

## 2018-06-29 LAB — HIV ANTIBODY (ROUTINE TESTING W REFLEX): HIV Screen 4th Generation wRfx: NONREACTIVE

## 2018-06-29 MED ORDER — STERILE WATER FOR INJECTION IV SOLN
INTRAVENOUS | Status: DC
  Administered 2018-06-29 – 2018-06-30 (×3): via INTRAVENOUS
  Filled 2018-06-29 (×5): qty 850

## 2018-06-29 MED ORDER — POTASSIUM CHLORIDE CRYS ER 20 MEQ PO TBCR
40.0000 meq | EXTENDED_RELEASE_TABLET | ORAL | Status: AC
  Administered 2018-06-29 (×2): 40 meq via ORAL
  Filled 2018-06-29 (×2): qty 2

## 2018-06-29 MED ORDER — SODIUM BICARBONATE 8.4 % IV SOLN: INTRAVENOUS | Status: DC

## 2018-06-29 MED ORDER — INSULIN ASPART 100 UNIT/ML ~~LOC~~ SOLN
0.0000 [IU] | Freq: Three times a day (TID) | SUBCUTANEOUS | Status: DC
  Administered 2018-06-29: 7 [IU] via SUBCUTANEOUS
  Administered 2018-06-29: 2 [IU] via SUBCUTANEOUS
  Administered 2018-06-30: 7 [IU] via SUBCUTANEOUS
  Administered 2018-06-30: 5 [IU] via SUBCUTANEOUS
  Administered 2018-06-30: 3 [IU] via SUBCUTANEOUS
  Administered 2018-07-01: 9 [IU] via SUBCUTANEOUS
  Administered 2018-07-01: 2 [IU] via SUBCUTANEOUS
  Administered 2018-07-02: 1 [IU] via SUBCUTANEOUS
  Administered 2018-07-02: 5 [IU] via SUBCUTANEOUS

## 2018-06-29 MED ORDER — INSULIN DETEMIR 100 UNIT/ML ~~LOC~~ SOLN
20.0000 [IU] | Freq: Every day | SUBCUTANEOUS | Status: DC
  Administered 2018-06-29: 20 [IU] via SUBCUTANEOUS
  Filled 2018-06-29 (×2): qty 0.2

## 2018-06-29 NOTE — Consult Note (Signed)
[facility]    NEPHROLOGY CONSULTATION NOTE  PATIENT ID:  Timothy Madden, DOB:  [birth year]  HPI: The patient is a 58 y.o. year old male patient with a past medical history significant for diabetes mellitus who presents with complaints of nausea, vomiting, and diarrhea for 5 days.  He had been out of his metformin due to financial reasons.  He reports having been on insulin for approximately 6 years.  He noted multiple episodes of emesis and diarrhea.  He presented to the emergency department for further evaluation.  He was found to be acidotic and was treated with insulin for DKA.  His serum creatinine was notably 6.5 on admission.  Renal consultation has been called for acute kidney injury and metabolic acidosis.   Past Medical History:  Diagnosis Date  . Amputation of finger, left   . Complication of anesthesia   . Diabetes mellitus without complication ([place])   . DKA (diabetic ketoacidoses) ([place]) 06/2018  . PONV (postoperative nausea and vomiting)     Past Surgical History:  Procedure Laterality Date  . I&D EXTREMITY Left 10/26/2013   Procedure: IRRIGATION AND DEBRIDEMENT EXTREMITY,PARTIAL AMPUTATION LEFT INDEX FINGER;  Surgeon: Dayna Barker, MD;  Location: [place];  Service: Plastics;  Laterality: Left;  . I&D EXTREMITY Left 10/30/2013   Procedure: IRRIGATION AND DEBRIDEMENT AND REVISION AMPUTATION OF LEFT INDEX FINGER;  Surgeon: Dayna Barker, MD;  Location: [place];  Service: Plastics;  Laterality: Left;    Family History  Problem Relation Age of Onset  . Hypertension Mother   . Diabetes Father   . Diabetes Sister   . Diabetes Brother   . Heart disease Brother     Social History   Tobacco Use  . Smoking status: Current Every Day Smoker    Packs/day: 1.00    Years: 20.00    Pack years: 20.00    Types: Cigarettes  . Smokeless tobacco: Current User    Types: Chew  Substance Use Topics  . Alcohol use: No    Alcohol/week: 0.0 standard drinks  . Drug use:  No    REVIEW OF SYSTEMS: General:  no fatigue, no weakness Head:  no headaches Eyes:  no blurred vision ENT:  no sore throat Neck:  no masses CV:  no chest pain, no orthopnea Lungs:  no shortness of breath, no cough GI: Positive nausea and vomiting, no diarrhea GU:  no dysuria or hematuria Skin:  no rashes or lesions Neuro:  no focal numbness or weakness Psych:  no depression or anxiety   PHYSICAL EXAM:  Vitals:   06/29/18 1220 06/29/18 1617  BP: 126/81 104/82  Pulse: 89 85  Resp: 18 16  Temp: 98.6 F (37 C) 98.3 F (36.8 C)  SpO2: 98% 100%   I/O last 3 completed shifts: In: 4699.5 [I.V.:3562.9; IV Piggyback:1136.7] Out: 2950 [Urine:2950]   General:  AAOx3 NAD HEENT: MMM [place] AT anicteric sclera Neck:  No JVD, no adenopathy CV:  Heart RRR  Lungs:  L/S CTA bilaterally Abd:  abd SNT/ND with normal BS GU:  Bladder non-palpable Extremities:  No LE edema. Skin:  No skin rash Psych:  normal mood and affect Neuro:  no focal deficits   CURRENT MEDICATIONS:  . insulin aspart  0-9 Units Subcutaneous TID WC  . insulin detemir  20 Units Subcutaneous Daily  . pantoprazole (PROTONIX) IV  40 mg Intravenous Q12H  . potassium chloride  40 mEq Oral Q4H  . sodium chloride flush  3 mL Intravenous Q12H  HOME MEDICATIONS:  Prior to Admission medications   Medication Sig Start Date End Date Taking? Authorizing Provider  glucose monitoring kit (FREESTYLE) monitoring kit 1 each by Does not apply route as needed for other. Dispense any model that is covered- dispense testing supplies for Q AC/ HS accuchecks- 1 month supply with one refil. Patient not taking: Reported on 06/28/2018 11/02/13   Debbe Odea, MD  insulin aspart (NOVOLOG FLEXPEN) 100 UNIT/ML FlexPen Inject 2 Units into the skin 3 (three) times daily with meals. Patient not taking: Reported on 06/28/2018 11/02/13   Debbe Odea, MD  Insulin Glargine (LANTUS SOLOSTAR) 100 UNIT/ML Solostar Pen Inject 20 Units into the  skin 2 (two) times daily. Patient not taking: Reported on 06/28/2018 11/02/13   Debbe Odea, MD  Insulin Pen Needle (NOVOFINE) 30G X 8 MM MISC Inject 10 each into the skin 4 (four) times daily. Patient not taking: Reported on 06/28/2018 11/02/13   Debbe Odea, MD  metFORMIN (GLUCOPHAGE) 500 MG tablet Take 1 tablet (500 mg total) by mouth daily with breakfast. Patient not taking: Reported on 06/28/2018 06/29/15   Kalman Drape, PA  Vitamin D, Ergocalciferol, (DRISDOL) 50000 UNITS CAPS capsule Take 1 capsule (50,000 Units total) by mouth every 7 (seven) days. Patient not taking: Reported on 06/29/2015 11/14/13   Lorayne Marek, MD       LABS:  CBC Latest Ref Rng & Units 06/29/2018 06/28/2018 06/28/2018  WBC 4.0 - 10.5 K/uL 24.9(H) - 21.9(H)  Hemoglobin 13.0 - 17.0 g/dL 12.2(L) 14.3 13.5  Hematocrit 39.0 - 52.0 % 34.3(L) 42.0 40.8  Platelets 150 - 400 K/uL 557(H) - 560(H)    CMP Latest Ref Rng & Units 06/29/2018 06/28/2018 06/28/2018  Glucose 70 - 99 mg/dL 181(H) 112(H) 232(H)  BUN 6 - 20 mg/dL 90(H) 99(H) 109(H)  Creatinine 0.61 - 1.24 mg/dL 4.96(H) 5.26(H) 5.86(H)  Sodium 135 - 145 mmol/L 131(L) 132(L) 128(L)  Potassium 3.5 - 5.1 mmol/L 3.1(L) 3.1(L) 3.5  Chloride 98 - 111 mmol/L 104 105 102  CO2 22 - 32 mmol/L 14(L) 12(L) 10(L)  Calcium 8.9 - 10.3 mg/dL 8.4(L) 8.7(L) 9.1  Total Protein 6.5 - 8.1 g/dL - - -  Total Bilirubin 0.3 - 1.2 mg/dL - - -  Alkaline Phos 38 - 126 U/L - - -  AST 15 - 41 U/L - - -  ALT 0 - 44 U/L - - -    Lab Results  Component Value Date   CALCIUM 8.4 (L) 06/29/2018   CAION 1.26 06/28/2018       Component Value Date/Time   COLORURINE YELLOW 06/28/2018 1626   APPEARANCEUR TURBID (A) 06/28/2018 1626   LABSPEC 1.013 06/28/2018 1626   PHURINE 6.0 06/28/2018 1626   GLUCOSEU >=500 (A) 06/28/2018 1626   HGBUR MODERATE (A) 06/28/2018 1626   BILIRUBINUR NEGATIVE 06/28/2018 1626   KETONESUR NEGATIVE 06/28/2018 1626   PROTEINUR 100 (A) 06/28/2018 1626    UROBILINOGEN 0.2 10/26/2013 0034   NITRITE NEGATIVE 06/28/2018 1626   LEUKOCYTESUR LARGE (A) 06/28/2018 1626      Component Value Date/Time   HCO3 11.8 (L) 06/28/2018 1558   TCO2 13 (L) 06/28/2018 1558   ACIDBASEDEF 16.0 (H) 06/28/2018 1558   O2SAT 62.0 06/28/2018 1558    No results found for: IRON, TIBC, FERRITIN, IRONPCTSAT     ASSESSMENT/PLAN:    58 year old male patient presents for DKA with persistent nausea, vomiting, and diarrhea for 5 days.  1.  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  I suspect his renal function has worsened since that time.  Likely on the basis of longstanding diabetes.  Urinalysis is significant for proteinuria.  2.  Acute kidney injury.  Likely on the basis of volume depletion in the setting of nausea, vomiting, and diarrhea.  Agree with IV fluids.  Will repeat a BMP now.    3.  Metabolic acidosis.  Repeat basic metabolic panel now.  Unclear if his gap remains and if he needs to be restarted on IV insulin.  Was just transitioned to subcu insulin today.  Blood sugars have improved.  Suspect urinary tract infection is contributing.  4.  Urinary tract infection.  Continue IV Rocephin.  Did have urinary retention.  Maintain Foley catheter.  5.  Acute gastroenteritis.  Continue treatment of electrolytes and repletion.       [place], DO, [place]

## 2018-06-29 NOTE — Progress Notes (Signed)
Patient ID: Timothy Madden, male   DOB: [birth year], 58 y.o.   MRN: [id number]  PROGRESS NOTE    Timothy Madden  LPF:[id number] DOB: [birth year] DOA: 06/28/2018 PCP: Marliss Coots, NP   Brief Narrative:  58 year old male with history of diabetes mellitus type 2 presented with nausea, vomiting and diarrhea.  He was found to have blood glucose of 798, anion gap unable to be calculated, CO2 of less than 7, sodium 117, BUN 118, creatinine of 6.75 with white count of 21.9 on presentation.  She was started on intravenous fluids and insulin drip.  Assessment & Plan:   Principal Problem:   DKA, type 2 ([place]) Active Problems:   TOBACCO ABUSE   Acute renal failure superimposed on chronic kidney disease (HCC)   Elevated lipase  DKA in a patient with diabetes mellitus type 2 and noncompliance -Patient apparently does not have a PCP and has not been taking his medications recently. -Presented with blood glucose of 798 with bicarb of less than 7 and anion gap unable to be calculated. -Started on insulin drip.  Blood sugars are improving.  Currently not nauseous.  Will start carb modified diet.  Transition to long-acting subcutaneous insulin.  Continue CBGs with SSI.  Follow hemoglobin A1c.  Diabetes coordinator consult. -Care management consult as patient does not have PCP.  Acute renal failure superimposed on chronic kidney disease stage III Metabolic acidosis -Patient's last creatinine was 1.79 in 06/2015.  Presented with creatinine of 6.75 -Creatinine 4.96 this morning. -Making urine.  Still acidotic. -We will switch to a bicarb drip.  Spoke with Dr. Andres Labrum who will evaluate the patient as well.  Hyponatremia-probably from above.  Improving.  Monitor  Hypokalemia -Replace.  Repeat a.m. labs  UTI Acute urinary retention with bilateral hydronephrosis seen on renal ultrasound -Continue Rocephin.  Currently has a Foley catheter which was initially bloody but currently clearing  up. -Spoke to Dr. McKenzie/urology who will evaluate the patient. -Follow cultures.  Leukocytosis -Probably from above.  Monitor  Nausea, vomiting, diarrhea -Most likely from DKA versus viral gastroenteritis.  Improving.  Antiemetics as needed.  Tobacco abuse -We will need to counsel about tobacco cessation.   DVT prophylaxis: Heparin discontinued due to hematuria.  SCDs Code Status: Full Family Communication: None at bedside Disposition Plan: Home in 2 to 3 days if clinically improves.  Consultants: Urology/nephrology  Procedures: None  Antimicrobials: Rocephin from 06/28/2018 onwards   Subjective: Patient seen and examined at bedside.  He is a poor historian.  Denies current nausea.  Wants to advance his diet.  No overnight fever.  Objective: Vitals:   06/28/18 2226 06/29/18 0033 06/29/18 0428 06/29/18 0750  BP: (!) 102/55 122/81 116/81 122/79  Pulse: 96 (!) 101 95 96  Resp: 16 16 16 16   Temp: 98.6 F (37 C) 98.4 F (36.9 C) 99.1 F (37.3 C) 99 F (37.2 C)  TempSrc: Oral Oral Oral Oral  SpO2: 100% 98% 100% 100%  Weight:      Height:        Intake/Output Summary (Last 24 hours) at 06/29/2018 0934 Last data filed at 06/29/2018 0600 Gross per 24 hour  Intake 4699.53 ml  Output 2950 ml  Net 1749.53 ml   Filed Weights   06/28/18 1502  Weight: 79.3 kg    Examination:  General exam: Appears calm and comfortable.  Looks older than stated age.  Looks chronically ill.  Poor historian. Respiratory system: Bilateral decreased breath sounds at bases with some scattered crackles  Cardiovascular system: S1 & S2 heard, Rate controlled at the time of my examination Gastrointestinal system: Abdomen is nondistended, soft and nontender. Normal bowel sounds heard. Extremities: No cyanosis, clubbing, edema  Central nervous system: Alert and oriented.  Very poor historian.  No focal neurological deficits. Moving extremities Skin: No rashes, lesions or ulcers Psychiatry: Flat  affect.    Data Reviewed: I have personally reviewed following labs and imaging studies  CBC: Recent Labs  Lab 06/28/18 1318 06/28/18 1558 06/29/18 0450  WBC 21.9*  --  24.9*  HGB 13.5 14.3 12.2*  HCT 40.8 42.0 34.3*  MCV 83.8  --  78.5*  PLT 560*  --  811*   Basic Metabolic Panel: Recent Labs  Lab 06/28/18 1318 06/28/18 1558 06/28/18 1822 06/28/18 2358 06/29/18 0450  NA 117* 122* 128* 132* 131*  K 4.6 4.3 3.5 3.1* 3.1*  CL 90*  --  102 105 104  CO2 <7*  --  10* 12* 14*  GLUCOSE 798*  --  232* 112* 181*  BUN 118*  --  109* 99* 90*  CREATININE 6.75*  --  5.86* 5.26* 4.96*  CALCIUM 9.0  --  9.1 8.7* 8.4*   GFR: Estimated Creatinine Clearance: 18.4 mL/min (A) (by C-G formula based on SCr of 4.96 mg/dL (H)). Liver Function Tests: Recent Labs  Lab 06/28/18 1318  AST 13*  ALT 10  ALKPHOS 96  BILITOT 0.9  PROT 8.5*  ALBUMIN 2.9*   Recent Labs  Lab 06/28/18 1318  LIPASE 161*   No results for input(s): AMMONIA in the last 168 hours. Coagulation Profile: No results for input(s): INR, PROTIME in the last 168 hours. Cardiac Enzymes: No results for input(s): CKTOTAL, CKMB, CKMBINDEX, TROPONINI in the last 168 hours. BNP (last 3 results) No results for input(s): PROBNP in the last 8760 hours. HbA1C: No results for input(s): HGBA1C in the last 72 hours. CBG: Recent Labs  Lab 06/29/18 0338 06/29/18 0441 06/29/18 0545 06/29/18 0647 06/29/18 0750  GLUCAP 148* 186* 205* 225* 185*   Lipid Profile: No results for input(s): CHOL, HDL, LDLCALC, TRIG, CHOLHDL, LDLDIRECT in the last 72 hours. Thyroid Function Tests: Recent Labs    06/28/18 1822  TSH 0.797   Anemia Panel: No results for input(s): VITAMINB12, FOLATE, FERRITIN, TIBC, IRON, RETICCTPCT in the last 72 hours. Sepsis Labs: No results for input(s): PROCALCITON, LATICACIDVEN in the last 168 hours.  No results found for this or any previous visit (from the past 240 hour(s)).       Radiology  Studies: US Renal  Result Date: 06/28/2018 CLINICAL DATA:  Nausea EXAM: RENAL / URINARY TRACT ULTRASOUND COMPLETE COMPARISON:  None. FINDINGS: Right Kidney: Renal measurements: 13.9 x 6 x 7 cm = volume: 308.7 mL. Cortical echogenicity normal. Marked hydronephrosis. Left Kidney: Renal measurements: 12.8 x 6.7 x 5.6 cm = volume: 248.1 mL. Cortical echogenicity normal. Marked hydronephrosis. Possible partially exophytic hypoechoic mass mid left kidney measuring 2.1 cm. Bladder: Foley catheter in the bladder.  Severe wall thickening up to 1.8 cm IMPRESSION: 1. Marked bilateral hydronephrosis. Severe bladder wall thickening which may be secondary to cystitis, chronic obstruction, or neoplasm. 2. Possible hypoechoic mass in the mid left kidney. Further evaluation with renal CT or MRI suggested when clinically feasible. Electronically Signed   By: Donavan Foil M.D.   On: 06/28/2018 19:39        Scheduled Meds: . insulin regular  0-10 Units Intravenous TID WC  . pantoprazole (PROTONIX) IV  40 mg Intravenous Q12H  .  sodium chloride flush  3 mL Intravenous Q12H   Continuous Infusions: . sodium chloride 125 mL/hr at 06/28/18 1618  . cefTRIAXone (ROCEPHIN)  IV 1 g (06/28/18 1841)  . dextrose 5 % and 0.45% NaCl 125 mL/hr at 06/29/18 0530  . insulin 2 Units/hr (06/29/18 0853)     LOS: 1 day        Aline August, MD [facility] 06/29/2018, 9:34 AM

## 2018-06-29 NOTE — Progress Notes (Signed)
Inpatient Diabetes Program Recommendations  AACE/ADA: New Consensus Statement on Inpatient Glycemic Control (2015)  Target Ranges:  Prepandial:   less than 140 mg/dL      Peak postprandial:   less than 180 mg/dL (1-2 hours)      Critically ill patients:  140 - 180 mg/dL   Spoke with patient about diabetes and home regimen for diabetes control. Patient reports that he just saw a doctor 2 weeks ago and had usually received insulin samples. However at this time did not receive insulin and has been without insulin for 2 weeks.   Patient also reports needing a new PCP? Per CM patient to receive match letter. Also spoke with patient about insulin at [employer] and glucose meters over the counter as well.   Patient does not follow a DM diet. Discussed glucose control. Patient verbalized understanding of information discussed and he states that he has no further questions at this time related to diabetes. Pt to follow up at [facility].   Thanks,  Tama Headings RN, MSN, [facility] Inpatient Diabetes Coordinator Team Pager [phone] (8a-5p)

## 2018-06-29 NOTE — Consult Note (Signed)
Urology Consult  Referring physician: Dr. Starla Link Reason for referral: Urinary retention, bilateral hydronephrosis  Chief Complaint: urinary retention  History of Present Illness: Timothy Madden is a 58yo with is history of DMII who was admitted with nausea, vomiting and DKA. He was found to have ARF and a creatinine of 6.75. A foley catheter was placed and patient made 3L of urine in 24 hours. Creatinine improved to 4.9. Prior to admission he had mild urinary urgency, urinary frequency and nocturia 1x. He strained to urinate and had a feeling of incomplete emptying. No other associated symptoms. No exacerbating/alleviiitng events. He LUTS have been present for several years. Renal US from 6/18 showed bilateral hydronephrosis and a thick bladder wall. Patient as never been on BPH therapy  Past Medical History:  Diagnosis Date  . Amputation of finger, left   . Complication of anesthesia   . Diabetes mellitus without complication ([place])   . DKA (diabetic ketoacidoses) ([place]) 06/2018  . PONV (postoperative nausea and vomiting)    Past Surgical History:  Procedure Laterality Date  . I&D EXTREMITY Left 10/26/2013   Procedure: IRRIGATION AND DEBRIDEMENT EXTREMITY,PARTIAL AMPUTATION LEFT INDEX FINGER;  Surgeon: Dayna Barker, MD;  Location: [facility];  Service: Plastics;  Laterality: Left;  . I&D EXTREMITY Left 10/30/2013   Procedure: IRRIGATION AND DEBRIDEMENT AND REVISION AMPUTATION OF LEFT INDEX FINGER;  Surgeon: Dayna Barker, MD;  Location: [facility];  Service: Plastics;  Laterality: Left;    Medications: I have reviewed the patient's current medications. Allergies: No Known Allergies  Family History  Problem Relation Age of Onset  . Hypertension Mother   . Diabetes Father   . Diabetes Sister   . Diabetes Brother   . Heart disease Brother    Social History:  reports that he has been smoking cigarettes. He has a 20.00 pack-year smoking history. His smokeless tobacco use includes chew. He reports  that he does not drink alcohol or use drugs.  Review of Systems  Gastrointestinal: Positive for nausea and vomiting.  All other systems reviewed and are negative.   Physical Exam:  Vital signs in last 24 hours: Temp:  [98.3 F (36.8 C)-99.1 F (37.3 C)] 98.4 F (36.9 C) (06/19 2051) Pulse Rate:  [85-101] 94 (06/19 2051) Resp:  [16-18] 16 (06/19 2051) BP: (104-126)/(77-82) 117/77 (06/19 2051) SpO2:  [98 %-100 %] 99 % (06/19 2051) Physical Exam  Constitutional: He is oriented to person, place, and time. He appears well-developed and well-nourished.  HENT:  Head: Normocephalic and atraumatic.  Eyes: Pupils are equal, round, and reactive to light. EOM are normal.  Neck: Normal range of motion. No thyromegaly present.  Cardiovascular: Normal rate and regular rhythm.  Respiratory: Effort normal. No respiratory distress.  GI: Soft. He exhibits no distension. Hernia confirmed negative in the right inguinal area and confirmed negative in the left inguinal area.  Genitourinary:    Testes and penis normal.   Musculoskeletal: Normal range of motion.        General: No edema.  Lymphadenopathy:       Right: No inguinal adenopathy present.       Left: No inguinal adenopathy present.  Neurological: He is alert and oriented to person, place, and time.  Skin: Skin is warm and dry.  Psychiatric: He has a normal mood and affect. His behavior is normal. Judgment and thought content normal.    Laboratory Data:  Results for orders placed or performed during the hospital encounter of 06/28/18 (from the past 72 hour(s))  CBC     Status: Abnormal   Collection Time: 06/28/18  1:18 PM  Result Value Ref Range   WBC 21.9 (H) 4.0 - 10.5 K/uL   RBC 4.87 4.22 - 5.81 MIL/uL   Hemoglobin 13.5 13.0 - 17.0 g/dL   HCT 40.8 39.0 - 52.0 %   MCV 83.8 80.0 - 100.0 fL   MCH 27.7 26.0 - 34.0 pg   MCHC 33.1 30.0 - 36.0 g/dL   RDW 12.8 11.5 - 15.5 %   Platelets 560 (H) 150 - 400 K/uL   nRBC 0.0 0.0 - 0.2 %     Comment: Performed at [facility], [place] [address] [place], [place] [address]  Comprehensive metabolic panel     Status: Abnormal   Collection Time: 06/28/18  1:18 PM  Result Value Ref Range   Sodium 117 (LL) 135 - 145 mmol/L    Comment: CRITICAL RESULT CALLED TO, READ BACK BY AND VERIFIED WITH: T MORRIS RN AT 1441 ON [id number] BY K FORSYTH    Potassium 4.6 3.5 - 5.1 mmol/L   Chloride 90 (L) 98 - 111 mmol/L   CO2 <7 (L) 22 - 32 mmol/L   Glucose, Bld 798 (HH) 70 - 99 mg/dL    Comment: CRITICAL RESULT CALLED TO, READ BACK BY AND VERIFIED WITH: T MORRIS RN AT 1441 ON [id number] BY K FORSYTH    BUN 118 (H) 6 - 20 mg/dL   Creatinine, Ser 6.75 (H) 0.61 - 1.24 mg/dL   Calcium 9.0 8.9 - 10.3 mg/dL   Total Protein 8.5 (H) 6.5 - 8.1 g/dL   Albumin 2.9 (L) 3.5 - 5.0 g/dL   AST 13 (L) 15 - 41 U/L   ALT 10 0 - 44 U/L   Alkaline Phosphatase 96 38 - 126 U/L   Total Bilirubin 0.9 0.3 - 1.2 mg/dL   GFR calc non Af Amer 8 (L) >60 mL/min   GFR calc Af Amer 10 (L) >60 mL/min   Anion gap NOT CALCULATED 5 - 15    Comment: Performed at [facility], [address], [place], [place] [address]  Lipase, blood     Status: Abnormal   Collection Time: 06/28/18  1:18 PM  Result Value Ref Range   Lipase 161 (H) 11 - 51 U/L    Comment: Performed at [facility], [place] [address], [place], [place] [address]  CBG monitoring, ED     Status: Abnormal   Collection Time: 06/28/18  3:01 PM  Result Value Ref Range   Glucose-Capillary 589 (HH) 70 - 99 mg/dL   Comment 1 Document in Chart   Novel Coronavirus,NAA,(SEND-OUT TO REF LAB - TAT 24-48 hrs); Hosp Order     Status: None   Collection Time: 06/28/18  3:44 PM   Specimen: Nasopharyngeal Swab; Respiratory  Result Value Ref Range   SARS-CoV-2, NAA NOT DETECTED NOT DETECTED    Comment: (NOTE) This test was developed and its performance characteristics determined by [employer]. This test has not been FDA cleared or approved. This test has  been authorized by FDA under an Emergency Use Authorization (EUA). This test is only authorized for the duration of time the declaration that circumstances exist justifying the authorization of the emergency use of in vitro diagnostic tests for detection of SARS-CoV-2 virus and/or diagnosis of COVID-19 infection under section 564(b)(1) of the Act, 21 U.S.C. 976BHA-1(P)(3), unless the authorization is terminated or revoked sooner. When diagnostic testing is negative, the possibility  of a false negative result should be considered in the context of a patient's recent exposures and the presence of clinical signs and symptoms consistent with COVID-19. An individual without symptoms of COVID-19 and who is not shedding SARS-CoV-2 virus would expect to have a negative (not detected) result in this assay. Performed  At: [facility] [place], [place] [id number] Rush Farmer MD EY:[id number]    Coronavirus Source NASOPHARYNGEAL     Comment: Performed at [facility], [place] [address] [place], [place] [address]  POCT I-Stat EG7     Status: Abnormal   Collection Time: 06/28/18  3:58 PM  Result Value Ref Range   pH, Ven 7.140 (LL) 7.250 - 7.430   pCO2, Ven 34.7 (L) 44.0 - 60.0 mmHg   pO2, Ven 41.0 32.0 - 45.0 mmHg   Bicarbonate 11.8 (L) 20.0 - 28.0 mmol/L   TCO2 13 (L) 22 - 32 mmol/L   O2 Saturation 62.0 %   Acid-base deficit 16.0 (H) 0.0 - 2.0 mmol/L   Sodium 122 (L) 135 - 145 mmol/L   Potassium 4.3 3.5 - 5.1 mmol/L   Calcium, Ion 1.26 1.15 - 1.40 mmol/L   HCT 42.0 39.0 - 52.0 %   Hemoglobin 14.3 13.0 - 17.0 g/dL   Patient temperature HIDE    Sample type VENOUS    Comment NOTIFIED PHYSICIAN   CBG monitoring, ED     Status: Abnormal   Collection Time: 06/28/18  4:15 PM  Result Value Ref Range   Glucose-Capillary 589 (HH) 70 - 99 mg/dL   Comment 1 Document in Chart   Urinalysis, Routine w reflex microscopic     Status: Abnormal   Collection Time: 06/28/18   4:26 PM  Result Value Ref Range   Color, Urine YELLOW YELLOW   APPearance TURBID (A) CLEAR   Specific Gravity, Urine 1.013 1.005 - 1.030   pH 6.0 5.0 - 8.0   Glucose, UA >=500 (A) NEGATIVE mg/dL   Hgb urine dipstick MODERATE (A) NEGATIVE   Bilirubin Urine NEGATIVE NEGATIVE   Ketones, ur NEGATIVE NEGATIVE mg/dL   Protein, ur 100 (A) NEGATIVE mg/dL   Nitrite NEGATIVE NEGATIVE   Leukocytes,Ua LARGE (A) NEGATIVE   RBC / HPF 6-10 0 - 5 RBC/hpf   WBC, UA >50 (H) 0 - 5 WBC/hpf   Bacteria, UA FEW (A) NONE SEEN   WBC Clumps PRESENT     Comment: Performed at [facility], [address] [place], [place] [address]  Urine Culture     Status: Abnormal (Preliminary result)   Collection Time: 06/28/18  4:29 PM   Specimen: Urine, Random  Result Value Ref Range   Specimen Description URINE, RANDOM    Special Requests      NONE Performed at [facility], [place] [address], [place], [place] [address]    Culture (A)     >=100,000 COLONIES/mL STAPHYLOCOCCUS SPECIES (COAGULASE NEGATIVE)   Report Status PENDING   Glucose, capillary     Status: Abnormal   Collection Time: 06/28/18  5:21 PM  Result Value Ref Range   Glucose-Capillary 377 (H) 70 - 99 mg/dL  Glucose, capillary     Status: Abnormal   Collection Time: 06/28/18  6:18 PM  Result Value Ref Range   Glucose-Capillary 230 (H) 70 - 99 mg/dL  HIV antibody (Routine Testing)     Status: None   Collection Time: 06/28/18  6:22 PM  Result Value Ref Range   HIV Screen  4th Generation wRfx Non Reactive Non Reactive    Comment: (NOTE) Performed At: [facility] [place], [place] [id number] Rush Farmer MD SN:[id number]   Basic metabolic panel     Status: Abnormal   Collection Time: 06/28/18  6:22 PM  Result Value Ref Range   Sodium 128 (L) 135 - 145 mmol/L   Potassium 3.5 3.5 - 5.1 mmol/L    Comment: DELTA CHECK NOTED   Chloride 102 98 - 111 mmol/L   CO2 10 (L) 22 - 32 mmol/L   Glucose, Bld 232 (H) 70 - 99  mg/dL   BUN 109 (H) 6 - 20 mg/dL   Creatinine, Ser 5.86 (H) 0.61 - 1.24 mg/dL   Calcium 9.1 8.9 - 10.3 mg/dL   GFR calc non Af Amer 10 (L) >60 mL/min   GFR calc Af Amer 11 (L) >60 mL/min   Anion gap 16 (H) 5 - 15    Comment: Performed at [facility], [place] [address] [place], [place] [address]  TSH     Status: None   Collection Time: 06/28/18  6:22 PM  Result Value Ref Range   TSH 0.797 0.350 - 4.500 uIU/mL    Comment: Performed by a 3rd Generation assay with a functional sensitivity of <=0.01 uIU/mL. Performed at [facility], [place] [address], [place], [place] [address]   Glucose, capillary     Status: Abnormal   Collection Time: 06/28/18  7:40 PM  Result Value Ref Range   Glucose-Capillary 125 (H) 70 - 99 mg/dL  Glucose, capillary     Status: Abnormal   Collection Time: 06/28/18  8:52 PM  Result Value Ref Range   Glucose-Capillary 181 (H) 70 - 99 mg/dL  Glucose, capillary     Status: Abnormal   Collection Time: 06/28/18 10:07 PM  Result Value Ref Range   Glucose-Capillary 212 (H) 70 - 99 mg/dL  Glucose, capillary     Status: Abnormal   Collection Time: 06/28/18 11:17 PM  Result Value Ref Range   Glucose-Capillary 169 (H) 70 - 99 mg/dL  Basic metabolic panel     Status: Abnormal   Collection Time: 06/28/18 11:58 PM  Result Value Ref Range   Sodium 132 (L) 135 - 145 mmol/L   Potassium 3.1 (L) 3.5 - 5.1 mmol/L   Chloride 105 98 - 111 mmol/L   CO2 12 (L) 22 - 32 mmol/L   Glucose, Bld 112 (H) 70 - 99 mg/dL   BUN 99 (H) 6 - 20 mg/dL   Creatinine, Ser 5.26 (H) 0.61 - 1.24 mg/dL   Calcium 8.7 (L) 8.9 - 10.3 mg/dL   GFR calc non Af Amer 11 (L) >60 mL/min   GFR calc Af Amer 13 (L) >60 mL/min   Anion gap 15 5 - 15    Comment: Performed at [facility], [place] [address], [place], [place] [address]  Glucose, capillary     Status: None   Collection Time: 06/29/18 12:18 AM  Result Value Ref Range   Glucose-Capillary 97 70 - 99 mg/dL  Glucose, capillary     Status:  None   Collection Time: 06/29/18  1:29 AM  Result Value Ref Range   Glucose-Capillary 93 70 - 99 mg/dL  Glucose, capillary     Status: Abnormal   Collection Time: 06/29/18  2:29 AM  Result Value Ref Range   Glucose-Capillary 124 (H) 70 - 99 mg/dL  Glucose, capillary     Status: Abnormal  Collection Time: 06/29/18  3:38 AM  Result Value Ref Range   Glucose-Capillary 148 (H) 70 - 99 mg/dL  Glucose, capillary     Status: Abnormal   Collection Time: 06/29/18  4:41 AM  Result Value Ref Range   Glucose-Capillary 186 (H) 70 - 99 mg/dL  Basic metabolic panel     Status: Abnormal   Collection Time: 06/29/18  4:50 AM  Result Value Ref Range   Sodium 131 (L) 135 - 145 mmol/L   Potassium 3.1 (L) 3.5 - 5.1 mmol/L   Chloride 104 98 - 111 mmol/L   CO2 14 (L) 22 - 32 mmol/L   Glucose, Bld 181 (H) 70 - 99 mg/dL   BUN 90 (H) 6 - 20 mg/dL   Creatinine, Ser 4.96 (H) 0.61 - 1.24 mg/dL   Calcium 8.4 (L) 8.9 - 10.3 mg/dL   GFR calc non Af Amer 12 (L) >60 mL/min   GFR calc Af Amer 14 (L) >60 mL/min   Anion gap 13 5 - 15    Comment: Performed at [facility] [address] [place], [place] [address]  CBC     Status: Abnormal   Collection Time: 06/29/18  4:50 AM  Result Value Ref Range   WBC 24.9 (H) 4.0 - 10.5 K/uL   RBC 4.37 4.22 - 5.81 MIL/uL   Hemoglobin 12.2 (L) 13.0 - 17.0 g/dL   HCT 34.3 (L) 39.0 - 52.0 %   MCV 78.5 (L) 80.0 - 100.0 fL   MCH 27.9 26.0 - 34.0 pg   MCHC 35.6 30.0 - 36.0 g/dL   RDW 12.3 11.5 - 15.5 %   Platelets 557 (H) 150 - 400 K/uL   nRBC 0.0 0.0 - 0.2 %    Comment: Performed at [facility], [place] [address] [place], [place] [address]  Glucose, capillary     Status: Abnormal   Collection Time: 06/29/18  5:45 AM  Result Value Ref Range   Glucose-Capillary 205 (H) 70 - 99 mg/dL  Glucose, capillary     Status: Abnormal   Collection Time: 06/29/18  6:47 AM  Result Value Ref Range   Glucose-Capillary 225 (H) 70 - 99 mg/dL  Glucose, capillary     Status:  Abnormal   Collection Time: 06/29/18  7:50 AM  Result Value Ref Range   Glucose-Capillary 185 (H) 70 - 99 mg/dL  Glucose, capillary     Status: Abnormal   Collection Time: 06/29/18  8:52 AM  Result Value Ref Range   Glucose-Capillary 126 (H) 70 - 99 mg/dL  Glucose, capillary     Status: Abnormal   Collection Time: 06/29/18  9:50 AM  Result Value Ref Range   Glucose-Capillary 120 (H) 70 - 99 mg/dL  Glucose, capillary     Status: Abnormal   Collection Time: 06/29/18 10:53 AM  Result Value Ref Range   Glucose-Capillary 136 (H) 70 - 99 mg/dL  Glucose, capillary     Status: Abnormal   Collection Time: 06/29/18 11:49 AM  Result Value Ref Range   Glucose-Capillary 200 (H) 70 - 99 mg/dL  Glucose, capillary     Status: Abnormal   Collection Time: 06/29/18  5:09 PM  Result Value Ref Range   Glucose-Capillary 326 (H) 70 - 99 mg/dL  Basic metabolic panel     Status: Abnormal   Collection Time: 06/29/18  7:05 PM  Result Value Ref Range   Sodium 129 (L) 135 - 145 mmol/L   Potassium 3.2 (L)  3.5 - 5.1 mmol/L   Chloride 100 98 - 111 mmol/L   CO2 18 (L) 22 - 32 mmol/L   Glucose, Bld 339 (H) 70 - 99 mg/dL   BUN 74 (H) 6 - 20 mg/dL   Creatinine, Ser 4.26 (H) 0.61 - 1.24 mg/dL   Calcium 7.7 (L) 8.9 - 10.3 mg/dL   GFR calc non Af Amer 14 (L) >60 mL/min   GFR calc Af Amer 17 (L) >60 mL/min   Anion gap 11 5 - 15    Comment: Performed at [facility] [address] [place], [place] [address]  Glucose, capillary     Status: Abnormal   Collection Time: 06/29/18  8:52 PM  Result Value Ref Range   Glucose-Capillary 305 (H) 70 - 99 mg/dL   Recent Results (from the past 240 hour(s))  Novel Coronavirus,NAA,(SEND-OUT TO REF LAB - TAT 24-48 hrs); Hosp Order     Status: None   Collection Time: 06/28/18  3:44 PM   Specimen: Nasopharyngeal Swab; Respiratory  Result Value Ref Range Status   SARS-CoV-2, NAA NOT DETECTED NOT DETECTED Final    Comment: (NOTE) This test was developed and its  performance characteristics determined by [employer]. This test has not been FDA cleared or approved. This test has been authorized by FDA under an Emergency Use Authorization (EUA). This test is only authorized for the duration of time the declaration that circumstances exist justifying the authorization of the emergency use of in vitro diagnostic tests for detection of SARS-CoV-2 virus and/or diagnosis of COVID-19 infection under section 564(b)(1) of the Act, 21 U.S.C. 425ZDG-3(O)(7), unless the authorization is terminated or revoked sooner. When diagnostic testing is negative, the possibility of a false negative result should be considered in the context of a patient's recent exposures and the presence of clinical signs and symptoms consistent with COVID-19. An individual without symptoms of COVID-19 and who is not shedding SARS-CoV-2 virus would expect to have a negative (not detected) result in this assay. Performed  At: [facility] [address] [place], [place] [id number] Rush Farmer MD OA:[id number]    [place]  Final    Comment: Performed at [facility], [place] [address], [place], [place] [address]  Urine Culture     Status: Abnormal (Preliminary result)   Collection Time: 06/28/18  4:29 PM   Specimen: Urine, Random  Result Value Ref Range Status   Specimen Description URINE, RANDOM  Final   Special Requests   Final    NONE Performed at [facility], [place] [address] [place], [place] [address]    Culture (A)  Final    >=100,000 COLONIES/mL STAPHYLOCOCCUS SPECIES (COAGULASE NEGATIVE)   Report Status PENDING  Incomplete   Creatinine: Recent Labs    06/28/18 1318 06/28/18 1822 06/28/18 2358 06/29/18 0450 06/29/18 1905  CREATININE 6.75* 5.86* 5.26* 4.96* 4.26*   Baseline Creatinine: unknown  Impression/Assessment:  58yo with BPH with Urinary retention, bilateral hydronephrosis  Plan:  1. Bilateral  hydronephrosis: His hydronephrosis is likely related to chronic bladder outlet obstruction and should improve with foley catheter drainage. Please repeat renal US in 72 hours to ensure improvement in his hydronephrosis 2. BPH with urinary retention: I discussed the natural history of urinary retention with the patient and the various cuases. 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 Please continue foley catheter to straight drain and start flomax 0.4mg  daily. The patient should followup in 1-2  weeks after discharge for a voiding trial  Timothy Madden 06/29/2018, 10:31 PM

## 2018-06-29 NOTE — TOC Initial Note (Signed)
Transition of Care [facility]) - Initial/Assessment Note    Patient Details  Name: Timothy Madden MRN: [id number] Date of Birth: Jun 06, 1960  [facility] CM/SW Contact:    Sharin Mons, RN Phone Number: 06/29/2018, 12:32 PM  Clinical Narrative:      Admitted with DKA. Hx of  diabetes mellitus type 2. From home with mom. Jobless, no income, no insurance, no PCP. PTA independent with ADL's , no DME usage.         Ormond Lazo (Mother)     [phone]      NCM shared information with pt  about [place]. Pt voiced  interest.Post hospital f/u scheduled with the [facility](virtual visit) per NCM and noted on AVS. Pt can establish primary care and medication needs @ clinic. However, if pt d/c over weekend Match Letter will be needed to assist with Rx med cost. Pt states has no glucometer. Prescription will be need for glucometer and kit @ d/c.  States has transportation to home once d/c.  NCM will continue to monitor for TOC needs....  Expected Discharge Plan: Home/Self Care Barriers to Discharge: Continued Medical Work up   Patient Goals and CMS Choice Patient states their goals for this hospitalization and ongoing recovery are:: Help straighten self out   Choice offered to / list presented to : NA  Expected Discharge Plan and Services Expected Discharge Plan: Home/Self Care In-house Referral: NA Discharge Planning Services: CM Consult   Living arrangements for the past 2 months: Apartment                           HH Arranged: NA [place] Agency: NA        Prior Living Arrangements/Services Living arrangements for the past 2 months: Apartment Lives with:: Parents Patient language and need for interpreter reviewed:: Yes Do you feel safe going back to the place where you live?: Yes      Need for Family Participation in Patient Care: No (Comment) Care giver support system in place?: Yes (comment)   Criminal Activity/Legal Involvement Pertinent to Current  Situation/Hospitalization: No - Comment as needed  Activities of Daily Living Home Assistive Devices/Equipment: None ADL Screening (condition at time of admission) Patient's cognitive ability adequate to safely complete daily activities?: Yes Is the patient deaf or have difficulty hearing?: No Does the patient have difficulty seeing, even when wearing glasses/contacts?: No Does the patient have difficulty concentrating, remembering, or making decisions?: No Patient able to express need for assistance with ADLs?: Yes Does the patient have difficulty dressing or bathing?: No Independently performs ADLs?: Yes (appropriate for developmental age) Does the patient have difficulty walking or climbing stairs?: No Weakness of Legs: None Weakness of Arms/Hands: None  Permission Sought/Granted      Share Information with NAME: Ermalinda Memos (Mother)     Permission granted to share info w Relationship: mom  Permission granted to share info w Contact Information: [phone]  Emotional Assessment Appearance:: Appears stated age Attitude/Demeanor/Rapport: Engaged Affect (typically observed): Accepting Orientation: : Oriented to Self, Oriented to Place, Oriented to  Time, Oriented to Situation Alcohol / Substance Use: Tobacco Use, Alcohol Use Psych Involvement: No (comment)  Admission diagnosis:  Dehydration [E86.0] ARF (acute renal failure) (HCC) [N17.9] Hyperglycemia [R73.9] AKI (acute kidney injury) ([place]) [N17.9] Patient Active Problem List   Diagnosis Date Noted  . DKA, type 2 ([place]) 06/28/2018  . Acute renal failure superimposed on chronic kidney disease ([place]) 06/28/2018  . Elevated  lipase 06/28/2018  . Osteomyelitis of finger of left hand ([place]) 10/26/2013  . Osteomyelitis ([place]) 10/26/2013  . Sepsis ([place]) 10/26/2013  . AKI (acute kidney injury) ([place]) 10/26/2013  . Hyponatremia 10/26/2013  . Type I diabetes mellitus with complication, uncontrolled ([place]) 10/26/2013  .  Essential hypertension 10/26/2013  . Anemia of chronic disease 10/26/2013  . PSYCHIATRIC DISORDER 07/19/2006  . TOBACCO ABUSE 07/19/2006  . DIABETES MELLITUS, TYPE II 11/30/2005  . ANXIETY 11/30/2005   PCP:  Marliss Coots, NP Pharmacy:   [facility], [address] [place] [address] Phone: [phone] Fax: [phone]     Social Determinants of Health (SDOH) Interventions    Readmission Risk Interventions No flowsheet data found.

## 2018-06-30 DIAGNOSIS — N184 Chronic kidney disease, stage 4 (severe): Secondary | ICD-10-CM

## 2018-06-30 DIAGNOSIS — R338 Other retention of urine: Secondary | ICD-10-CM

## 2018-06-30 LAB — CBC WITH DIFFERENTIAL/PLATELET
Abs Immature Granulocytes: 0.8 10*3/uL — ABNORMAL HIGH (ref 0.00–0.07)
Basophils Absolute: 0.1 10*3/uL (ref 0.0–0.1)
Basophils Relative: 0 %
Eosinophils Absolute: 0 10*3/uL (ref 0.0–0.5)
Eosinophils Relative: 0 %
HCT: 32.3 % — ABNORMAL LOW (ref 39.0–52.0)
Hemoglobin: 11.7 g/dL — ABNORMAL LOW (ref 13.0–17.0)
Immature Granulocytes: 3 %
Lymphocytes Relative: 6 %
Lymphs Abs: 1.5 10*3/uL (ref 0.7–4.0)
MCH: 28 pg (ref 26.0–34.0)
MCHC: 36.2 g/dL — ABNORMAL HIGH (ref 30.0–36.0)
MCV: 77.3 fL — ABNORMAL LOW (ref 80.0–100.0)
Monocytes Absolute: 1.5 10*3/uL — ABNORMAL HIGH (ref 0.1–1.0)
Monocytes Relative: 6 %
Neutro Abs: 22.7 10*3/uL — ABNORMAL HIGH (ref 1.7–7.7)
Neutrophils Relative %: 85 %
Platelets: 538 10*3/uL — ABNORMAL HIGH (ref 150–400)
RBC: 4.18 MIL/uL — ABNORMAL LOW (ref 4.22–5.81)
RDW: 12.4 % (ref 11.5–15.5)
WBC: 26.7 10*3/uL — ABNORMAL HIGH (ref 4.0–10.5)
nRBC: 0.1 % (ref 0.0–0.2)

## 2018-06-30 LAB — BASIC METABOLIC PANEL
Anion gap: 12 (ref 5–15)
BUN: 68 mg/dL — ABNORMAL HIGH (ref 6–20)
CO2: 21 mmol/L — ABNORMAL LOW (ref 22–32)
Calcium: 8 mg/dL — ABNORMAL LOW (ref 8.9–10.3)
Chloride: 99 mmol/L (ref 98–111)
Creatinine, Ser: 4.01 mg/dL — ABNORMAL HIGH (ref 0.61–1.24)
GFR calc Af Amer: 18 mL/min — ABNORMAL LOW (ref >60)
GFR calc non Af Amer: 16 mL/min — ABNORMAL LOW (ref >60)
Glucose, Bld: 315 mg/dL — ABNORMAL HIGH (ref 70–99)
Potassium: 3.1 mmol/L — ABNORMAL LOW (ref 3.5–5.1)
Sodium: 132 mmol/L — ABNORMAL LOW (ref 135–145)

## 2018-06-30 LAB — URINE CULTURE: Culture: >=100000 — AB

## 2018-06-30 LAB — HEMOGLOBIN A1C
Hgb A1c MFr Bld: 14.6 % — ABNORMAL HIGH (ref 4.8–5.6)
Mean Plasma Glucose: 372 mg/dL

## 2018-06-30 LAB — GLUCOSE, CAPILLARY
Glucose-Capillary: 256 mg/dL — ABNORMAL HIGH (ref 70–99)
Glucose-Capillary: 325 mg/dL — ABNORMAL HIGH (ref 70–99)
Glucose-Capillary: 232 mg/dL — ABNORMAL HIGH (ref 70–99)

## 2018-06-30 LAB — MAGNESIUM: Magnesium: 1.5 mg/dL — ABNORMAL LOW (ref 1.7–2.4)

## 2018-06-30 MED ORDER — INSULIN DETEMIR 100 UNIT/ML ~~LOC~~ SOLN
25.0000 [IU] | Freq: Every day | SUBCUTANEOUS | Status: DC
  Administered 2018-06-30: 25 [IU] via SUBCUTANEOUS
  Filled 2018-06-30 (×2): qty 0.25

## 2018-06-30 MED ORDER — INSULIN ASPART 100 UNIT/ML ~~LOC~~ SOLN
5.0000 [IU] | Freq: Three times a day (TID) | SUBCUTANEOUS | Status: DC
  Administered 2018-06-30 – 2018-07-02 (×7): 5 [IU] via SUBCUTANEOUS

## 2018-06-30 MED ORDER — SODIUM BICARBONATE 650 MG PO TABS
650.0000 mg | ORAL_TABLET | Freq: Two times a day (BID) | ORAL | Status: DC
  Administered 2018-06-30 – 2018-07-02 (×5): 650 mg via ORAL
  Filled 2018-06-30 (×5): qty 1

## 2018-06-30 MED ORDER — TAMSULOSIN HCL 0.4 MG PO CAPS
0.4000 mg | ORAL_CAPSULE | Freq: Every day | ORAL | Status: DC
  Administered 2018-06-30 – 2018-07-01 (×2): 0.4 mg via ORAL
  Filled 2018-06-30 (×2): qty 1

## 2018-06-30 MED ORDER — POTASSIUM CHLORIDE CRYS ER 20 MEQ PO TBCR
40.0000 meq | EXTENDED_RELEASE_TABLET | ORAL | Status: AC
  Administered 2018-06-30 (×2): 40 meq via ORAL
  Filled 2018-06-30 (×2): qty 2

## 2018-06-30 MED ORDER — MAGNESIUM SULFATE 2 GM/50ML IV SOLN
2.0000 g | Freq: Once | INTRAVENOUS | Status: AC
  Administered 2018-06-30: 2 g via INTRAVENOUS
  Filled 2018-06-30: qty 50

## 2018-06-30 NOTE — Plan of Care (Signed)

## 2018-06-30 NOTE — Progress Notes (Signed)
Patient ID: Timothy Madden, male   DOB: Sep 09, 1960, 58 y.o.   MRN: [id number]  PROGRESS NOTE    Timothy Madden  DVV:[id number] DOB: 04-11-1960 DOA: 06/28/2018 PCP: Marliss Coots, NP   Brief Narrative:  58 year old male with history of diabetes mellitus type 2 presented with nausea, vomiting and diarrhea.  He was found to have blood glucose of 798, anion gap unable to be calculated, CO2 of less than 7, sodium 117, BUN 118, creatinine of 6.75 with white count of 21.9 on presentation.  He was started on intravenous fluids and insulin drip.  Renal ultrasound showed bilateral hydronephrosis.  Nephrology and urology were consulted.  Foley catheter was placed.  Assessment & Plan:   Principal Problem:   DKA, type 2 ([place]) Active Problems:   TOBACCO ABUSE   Acute renal failure superimposed on chronic kidney disease (HCC)   Elevated lipase  DKA in a patient with diabetes mellitus type 2 and noncompliance -Patient apparently does not have a PCP and has not been taking his medications recently. -Presented with blood glucose of 798 with bicarb of less than 7 and anion gap unable to be calculated. -Started on insulin drip which was subsequently transitioned to long-acting insulin.  Blood sugar still on the high side.  Increase Levemir to 25 units daily.  Start NovoLog 5 units 3 times a day with meals.  Continue CBGs with SSI.  Hemoglobin A1c pending.  Diabetes coordinator consulted.  Tolerating diet.  Continue carb modified diet.   -Care management consulted as patient does not have PCP.  Acute renal failure superimposed on chronic kidney disease stage III Metabolic acidosis -Patient's last creatinine was 1.79 in 06/2015.  Presented with creatinine of 6.75 -Creatinine 4.01 this morning. -Making urine.  Currently on bicarb drip.  Acidosis has much improved.  Will wait for further nephrology recommendations regarding bicarb drip.  Hyponatremia-probably from above.  Improving.  Monitor  Hypokalemia  -Replace.  Repeat a.m. labs  Hypomagnesemia Replace.  Repeat a.m. labs  UTI Acute urinary retention with bilateral hydronephrosis seen on renal ultrasound -Continue Rocephin.  Urine culture growing staph epidermidis.  -Continue Foley catheter for now which is draining intermittently blood-tinged urine.  Urology evaluation appreciated.  Continue Flomax.  Outpatient follow-up with urology.  Leukocytosis -Probably from above.  Worsening  Nausea, vomiting, diarrhea -Most likely from DKA versus viral gastroenteritis.  Improving.  Antiemetics as needed.  Tobacco abuse -We will need to counsel about tobacco cessation.   DVT prophylaxis: Heparin discontinued due to hematuria.  SCDs Code Status: Full Family Communication: None at bedside Disposition Plan: Home in 2 to 3 days if clinically improves.  Consultants: Urology/nephrology  Procedures: None  Antimicrobials: Rocephin from 06/28/2018 onwards   Subjective: Patient seen and examined at bedside.  He is a poor historian.  He denies current nausea or vomiting.  He is tolerating diet.  No overnight fever. Objective: Vitals:   06/30/18 0008 06/30/18 0014 06/30/18 0418 06/30/18 0525  BP: (!) 89/59 107/73 111/78   Pulse: 94 93 94   Resp: 14 16 16    Temp: 98.5 F (36.9 C) 98.7 F (37.1 C) 98.6 F (37 C)   TempSrc: Oral Oral Oral   SpO2: 98% 100% 100%   Weight:    68.1 kg  Height:        Intake/Output Summary (Last 24 hours) at 06/30/2018 0843 Last data filed at 06/30/2018 0414 Gross per 24 hour  Intake 2241.64 ml  Output 3000 ml  Net -758.36 ml  Filed Weights   06/28/18 1502 06/30/18 0525  Weight: 79.3 kg 68.1 kg    Examination:  General exam: No acute distress.  Looks older than stated age.  Looks chronically ill.  Poor historian.  Very thinly built. Respiratory system: Bilateral decreased breath sounds at bases with some scattered crackles.  No wheezing Cardiovascular system: S1 & S2 heard, rate controlled   gastrointestinal system: Abdomen is nondistended, soft and nontender. Normal bowel sounds heard. Extremities: No cyanosis, clubbing, edema  Central nervous system: Alert and oriented.  Poor historian.  No focal neurological deficits. Moving extremities Skin: No rashes, lesions or ulcers Psychiatry: Flat affect.  Makes very less conversation.    Data Reviewed: I have personally reviewed following labs and imaging studies  CBC: Recent Labs  Lab 06/28/18 1318 06/28/18 1558 06/29/18 0450 06/30/18 0339  WBC 21.9*  --  24.9* 26.7*  NEUTROABS  --   --   --  22.7*  HGB 13.5 14.3 12.2* 11.7*  HCT 40.8 42.0 34.3* 32.3*  MCV 83.8  --  78.5* 77.3*  PLT 560*  --  557* 885*   Basic Metabolic Panel: Recent Labs  Lab 06/28/18 1822 06/28/18 2358 06/29/18 0450 06/29/18 1905 06/30/18 0339  NA 128* 132* 131* 129* 132*  K 3.5 3.1* 3.1* 3.2* 3.1*  CL 102 105 104 100 99  CO2 10* 12* 14* 18* 21*  GLUCOSE 232* 112* 181* 339* 315*  BUN 109* 99* 90* 74* 68*  CREATININE 5.86* 5.26* 4.96* 4.26* 4.01*  CALCIUM 9.1 8.7* 8.4* 7.7* 8.0*  MG  --   --   --   --  1.5*   GFR: Estimated Creatinine Clearance: 19.6 mL/min (A) (by C-G formula based on SCr of 4.01 mg/dL (H)). Liver Function Tests: Recent Labs  Lab 06/28/18 1318  AST 13*  ALT 10  ALKPHOS 96  BILITOT 0.9  PROT 8.5*  ALBUMIN 2.9*   Recent Labs  Lab 06/28/18 1318  LIPASE 161*   No results for input(s): AMMONIA in the last 168 hours. Coagulation Profile: No results for input(s): INR, PROTIME in the last 168 hours. Cardiac Enzymes: No results for input(s): CKTOTAL, CKMB, CKMBINDEX, TROPONINI in the last 168 hours. BNP (last 3 results) No results for input(s): PROBNP in the last 8760 hours. HbA1C: Recent Labs    06/29/18 0450  HGBA1C 14.6*   CBG: Recent Labs  Lab 06/29/18 1053 06/29/18 1149 06/29/18 1709 06/29/18 2052 06/30/18 0748  GLUCAP 136* 200* 326* 305* 256*   Lipid Profile: No results for input(s): CHOL,  HDL, LDLCALC, TRIG, CHOLHDL, LDLDIRECT in the last 72 hours. Thyroid Function Tests: Recent Labs    06/28/18 1822  TSH 0.797   Anemia Panel: No results for input(s): VITAMINB12, FOLATE, FERRITIN, TIBC, IRON, RETICCTPCT in the last 72 hours. Sepsis Labs: No results for input(s): PROCALCITON, LATICACIDVEN in the last 168 hours.  Recent Results (from the past 240 hour(s))  Novel Coronavirus,NAA,(SEND-OUT TO REF LAB - TAT 24-48 hrs); Hosp Order     Status: None   Collection Time: 06/28/18  3:44 PM   Specimen: Nasopharyngeal Swab; Respiratory  Result Value Ref Range Status   SARS-CoV-2, NAA NOT DETECTED NOT DETECTED Final    Comment: (NOTE) This test was developed and its performance characteristics determined by [employer]. This test has not been FDA cleared or approved. This test has been authorized by FDA under an Emergency Use Authorization (EUA). This test is only authorized for the duration of time the declaration that circumstances exist  justifying the authorization of the emergency use of in vitro diagnostic tests for detection of SARS-CoV-2 virus and/or diagnosis of COVID-19 infection under section 564(b)(1) of the Act, 21 U.S.C. 353GDJ-2(E)(2), unless the authorization is terminated or revoked sooner. When diagnostic testing is negative, the possibility of a false negative result should be considered in the context of a patient's recent exposures and the presence of clinical signs and symptoms consistent with COVID-19. An individual without symptoms of COVID-19 and who is not shedding SARS-CoV-2 virus would expect to have a negative (not detected) result in this assay. Performed  At: [facility] [address] [place], [place] [id number] Rush Farmer MD WL:[id number]    Export  Final    Comment: Performed at [facility], [place] [address], [place], [place] [address]  Urine Culture     Status: Abnormal   Collection  Time: 06/28/18  4:29 PM   Specimen: Urine, Random  Result Value Ref Range Status   Specimen Description URINE, RANDOM  Final   Special Requests   Final    NONE Performed at [facility], [place] [address], [place], [place] [address]    Culture >=100,000 COLONIES/mL STAPHYLOCOCCUS EPIDERMIDIS (A)  Final   Report Status 06/30/2018 FINAL  Final   Organism ID, Bacteria STAPHYLOCOCCUS EPIDERMIDIS (A)  Final      Susceptibility   Staphylococcus epidermidis - MIC*    CIPROFLOXACIN <=0.5 SENSITIVE Sensitive     GENTAMICIN <=0.5 SENSITIVE Sensitive     NITROFURANTOIN <=16 SENSITIVE Sensitive     OXACILLIN <=0.25 SENSITIVE Sensitive     TETRACYCLINE <=1 SENSITIVE Sensitive     VANCOMYCIN 1 SENSITIVE Sensitive     TRIMETH/SULFA 20 SENSITIVE Sensitive     CLINDAMYCIN <=0.25 SENSITIVE Sensitive     RIFAMPIN <=0.5 SENSITIVE Sensitive     Inducible Clindamycin NEGATIVE Sensitive     * >=100,000 COLONIES/mL STAPHYLOCOCCUS EPIDERMIDIS         Radiology Studies: US Renal  Result Date: 06/28/2018 CLINICAL DATA:  Nausea EXAM: RENAL / URINARY TRACT ULTRASOUND COMPLETE COMPARISON:  None. FINDINGS: Right Kidney: Renal measurements: 13.9 x 6 x 7 cm = volume: 308.7 mL. Cortical echogenicity normal. Marked hydronephrosis. Left Kidney: Renal measurements: 12.8 x 6.7 x 5.6 cm = volume: 248.1 mL. Cortical echogenicity normal. Marked hydronephrosis. Possible partially exophytic hypoechoic mass mid left kidney measuring 2.1 cm. Bladder: Foley catheter in the bladder.  Severe wall thickening up to 1.8 cm IMPRESSION: 1. Marked bilateral hydronephrosis. Severe bladder wall thickening which may be secondary to cystitis, chronic obstruction, or neoplasm. 2. Possible hypoechoic mass in the mid left kidney. Further evaluation with renal CT or MRI suggested when clinically feasible. Electronically Signed   By: Donavan Foil M.D.   On: 06/28/2018 19:39        Scheduled Meds: . insulin aspart  0-9 Units  Subcutaneous TID WC  . insulin aspart  5 Units Subcutaneous TID WC  . insulin detemir  25 Units Subcutaneous Daily  . pantoprazole (PROTONIX) IV  40 mg Intravenous Q12H  . potassium chloride  40 mEq Oral Q4H  . sodium chloride flush  3 mL Intravenous Q12H   Continuous Infusions: . cefTRIAXone (ROCEPHIN)  IV 1 g (06/29/18 1717)  . magnesium sulfate bolus IVPB    .  sodium bicarbonate (isotonic) infusion in sterile water 125 mL/hr at 06/30/18 0804     LOS: 2 days        Aline August, MD [facility] 06/30/2018, 8:43  AM

## 2018-06-30 NOTE — Progress Notes (Signed)
[facility]    NEPHROLOGY PROGRESS NOTE  SUBJECTIVE: Patient seen and examined.  No acute complaints today.  Denies chest pain, shortness of breath, nausea, vomiting, diarrhea or dysuria.  All other review of systems are negative.     OBJECTIVE:  Vitals:   06/30/18 0014 06/30/18 0418  BP: 107/73 111/78  Pulse: 93 94  Resp: 16 16  Temp: 98.7 F (37.1 C) 98.6 F (37 C)  SpO2: 100% 100%    Intake/Output Summary (Last 24 hours) at 06/30/2018 1229 Last data filed at 06/30/2018 1006 Gross per 24 hour  Intake 4789.14 ml  Output 3050 ml  Net 1739.14 ml      General:  AAOx3 NAD HEENT: MMM [place] AT anicteric sclera Neck:  No JVD, no adenopathy CV:  Heart RRR  Lungs:  L/S CTA bilaterally Abd:  abd SNT/ND with normal BS GU:  Bladder non-palpable Extremities:  No LE edema. Skin:  No skin rash  MEDICATIONS:  . insulin aspart  0-9 Units Subcutaneous TID WC  . insulin aspart  5 Units Subcutaneous TID WC  . insulin detemir  25 Units Subcutaneous Daily  . pantoprazole (PROTONIX) IV  40 mg Intravenous Q12H  . potassium chloride  40 mEq Oral Q4H  . sodium chloride flush  3 mL Intravenous Q12H  . tamsulosin  0.4 mg Oral QPC supper       LABS:   CBC Latest Ref Rng & Units 06/30/2018 06/29/2018 06/28/2018  WBC 4.0 - 10.5 K/uL 26.7(H) 24.9(H) -  Hemoglobin 13.0 - 17.0 g/dL 11.7(L) 12.2(L) 14.3  Hematocrit 39.0 - 52.0 % 32.3(L) 34.3(L) 42.0  Platelets 150 - 400 K/uL 538(H) 557(H) -    CMP Latest Ref Rng & Units 06/30/2018 06/29/2018 06/29/2018  Glucose 70 - 99 mg/dL 315(H) 339(H) 181(H)  BUN 6 - 20 mg/dL 68(H) 74(H) 90(H)  Creatinine 0.61 - 1.24 mg/dL 4.01(H) 4.26(H) 4.96(H)  Sodium 135 - 145 mmol/L 132(L) 129(L) 131(L)  Potassium 3.5 - 5.1 mmol/L 3.1(L) 3.2(L) 3.1(L)  Chloride 98 - 111 mmol/L 99 100 104  CO2 22 - 32 mmol/L 21(L) 18(L) 14(L)  Calcium 8.9 - 10.3 mg/dL 8.0(L) 7.7(L) 8.4(L)  Total Protein 6.5 - 8.1 g/dL - - -  Total Bilirubin 0.3 - 1.2 mg/dL - - -   Alkaline Phos 38 - 126 U/L - - -  AST 15 - 41 U/L - - -  ALT 0 - 44 U/L - - -    Lab Results  Component Value Date   CALCIUM 8.0 (L) 06/30/2018   CAION 1.26 06/28/2018       Component Value Date/Time   COLORURINE YELLOW 06/28/2018 1626   APPEARANCEUR TURBID (A) 06/28/2018 1626   LABSPEC 1.013 06/28/2018 1626   PHURINE 6.0 06/28/2018 1626   GLUCOSEU >=500 (A) 06/28/2018 1626   HGBUR MODERATE (A) 06/28/2018 1626   BILIRUBINUR NEGATIVE 06/28/2018 1626   KETONESUR NEGATIVE 06/28/2018 1626   PROTEINUR 100 (A) 06/28/2018 1626   UROBILINOGEN 0.2 10/26/2013 0034   NITRITE NEGATIVE 06/28/2018 1626   LEUKOCYTESUR LARGE (A) 06/28/2018 1626      Component Value Date/Time   HCO3 11.8 (L) 06/28/2018 1558   TCO2 13 (L) 06/28/2018 1558   ACIDBASEDEF 16.0 (H) 06/28/2018 1558   O2SAT 62.0 06/28/2018 1558    No results found for: IRON, TIBC, FERRITIN, IRONPCTSAT     ASSESSMENT/PLAN:    58 year old male patient presents for DKA with persistent nausea, vomiting, and diarrhea for 5 days.  1.  Chronic kidney  disease stage III-IV with a baseline serum creatinine of 1.79 from 06/29/2015.  I suspect his renal function has worsened since that time.  Likely on the basis of longstanding diabetes.  Urinalysis is significant for proteinuria.  Will check SPEP for completeness.  2.  Acute kidney injury.  Likely on the basis of volume depletion in the setting of nausea, vomiting, and diarrhea.    Will discontinue IV fluids.  3.  Metabolic acidosis.    Resolving.  Will transition to oral sodium bicarbonate.  4.  Urinary tract infection.  Continue IV Rocephin.  Did have urinary retention.    Marked bilateral hydronephrosis was noted on renal ultrasound.  Maintain Foley catheter.  Needs urology evaluation for possible exophytic mass in the left kidney.  Urology has been following.  5.  Acute gastroenteritis.  Continue treatment of electrolytes and repletion.    [place], DO, [place]

## 2018-07-01 DIAGNOSIS — E872 Acidosis: Secondary | ICD-10-CM

## 2018-07-01 LAB — CBC WITH DIFFERENTIAL/PLATELET
Abs Immature Granulocytes: 0.44 10*3/uL — ABNORMAL HIGH (ref 0.00–0.07)
Basophils Absolute: 0.1 10*3/uL (ref 0.0–0.1)
Basophils Relative: 0 %
Eosinophils Absolute: 0.2 10*3/uL (ref 0.0–0.5)
Eosinophils Relative: 1 %
HCT: 35.2 % — ABNORMAL LOW (ref 39.0–52.0)
Hemoglobin: 12 g/dL — ABNORMAL LOW (ref 13.0–17.0)
Immature Granulocytes: 2 %
Lymphocytes Relative: 7 %
Lymphs Abs: 1.7 10*3/uL (ref 0.7–4.0)
MCH: 27.4 pg (ref 26.0–34.0)
MCHC: 34.1 g/dL (ref 30.0–36.0)
MCV: 80.4 fL (ref 80.0–100.0)
Monocytes Absolute: 1.3 10*3/uL — ABNORMAL HIGH (ref 0.1–1.0)
Monocytes Relative: 5 %
Neutro Abs: 20.6 10*3/uL — ABNORMAL HIGH (ref 1.7–7.7)
Neutrophils Relative %: 85 %
Platelets: 530 10*3/uL — ABNORMAL HIGH (ref 150–400)
RBC: 4.38 MIL/uL (ref 4.22–5.81)
RDW: 12.6 % (ref 11.5–15.5)
WBC: 24.4 10*3/uL — ABNORMAL HIGH (ref 4.0–10.5)
nRBC: 0 % (ref 0.0–0.2)

## 2018-07-01 LAB — BASIC METABOLIC PANEL
Anion gap: 12 (ref 5–15)
BUN: 50 mg/dL — ABNORMAL HIGH (ref 6–20)
CO2: 27 mmol/L (ref 22–32)
Calcium: 8.4 mg/dL — ABNORMAL LOW (ref 8.9–10.3)
Chloride: 98 mmol/L (ref 98–111)
Creatinine, Ser: 3.32 mg/dL — ABNORMAL HIGH (ref 0.61–1.24)
GFR calc Af Amer: 23 mL/min — ABNORMAL LOW (ref >60)
GFR calc non Af Amer: 19 mL/min — ABNORMAL LOW (ref >60)
Glucose, Bld: 154 mg/dL — ABNORMAL HIGH (ref 70–99)
Potassium: 3.3 mmol/L — ABNORMAL LOW (ref 3.5–5.1)
Sodium: 137 mmol/L (ref 135–145)

## 2018-07-01 LAB — GLUCOSE, CAPILLARY
Glucose-Capillary: 154 mg/dL — ABNORMAL HIGH (ref 70–99)
Glucose-Capillary: 424 mg/dL — ABNORMAL HIGH (ref 70–99)
Glucose-Capillary: 93 mg/dL (ref 70–99)
Glucose-Capillary: 191 mg/dL — ABNORMAL HIGH (ref 70–99)

## 2018-07-01 LAB — MAGNESIUM: Magnesium: 1.9 mg/dL (ref 1.7–2.4)

## 2018-07-01 MED ORDER — ALPRAZOLAM 0.25 MG PO TABS
0.2500 mg | ORAL_TABLET | Freq: Two times a day (BID) | ORAL | Status: DC | PRN
  Administered 2018-07-01 – 2018-07-02 (×3): 0.25 mg via ORAL
  Filled 2018-07-01 (×3): qty 1

## 2018-07-01 MED ORDER — ALPRAZOLAM 0.25 MG PO TABS
0.2500 mg | ORAL_TABLET | Freq: Once | ORAL | Status: AC
  Administered 2018-07-01: 0.25 mg via ORAL
  Filled 2018-07-01: qty 1

## 2018-07-01 MED ORDER — PANTOPRAZOLE SODIUM 40 MG PO TBEC
40.0000 mg | DELAYED_RELEASE_TABLET | Freq: Every day | ORAL | Status: DC
  Administered 2018-07-01 – 2018-07-02 (×2): 40 mg via ORAL
  Filled 2018-07-01 (×2): qty 1

## 2018-07-01 MED ORDER — INSULIN DETEMIR 100 UNIT/ML ~~LOC~~ SOLN
30.0000 [IU] | Freq: Every day | SUBCUTANEOUS | Status: DC
  Administered 2018-07-01 – 2018-07-02 (×2): 30 [IU] via SUBCUTANEOUS
  Filled 2018-07-01 (×2): qty 0.3

## 2018-07-01 MED ORDER — POTASSIUM CHLORIDE CRYS ER 20 MEQ PO TBCR
40.0000 meq | EXTENDED_RELEASE_TABLET | Freq: Once | ORAL | Status: AC
  Administered 2018-07-01: 40 meq via ORAL
  Filled 2018-07-01: qty 2

## 2018-07-01 NOTE — Progress Notes (Signed)
HS CBG was 234; result would not cross over in system.

## 2018-07-01 NOTE — Progress Notes (Signed)
[facility]    NEPHROLOGY PROGRESS NOTE  SUBJECTIVE: Patient seen and examined.  No acute complaints today.  Denies chest pain, shortness of breath, nausea, vomiting, diarrhea or dysuria.  All other review of systems are negative.     OBJECTIVE:  Vitals:   06/30/18 2105 07/01/18 0513  BP: 105/79 110/80  Pulse: 81 93  Resp: 18 16  Temp: 98.7 F (37.1 C) 98.3 F (36.8 C)  SpO2: 99% 100%    Intake/Output Summary (Last 24 hours) at 07/01/2018 1449 Last data filed at 07/01/2018 1106 Gross per 24 hour  Intake 457.36 ml  Output 1051 ml  Net -593.64 ml      General:  AAOx3 NAD HEENT: MMM [place] AT anicteric sclera Neck:  No JVD, no adenopathy CV:  Heart RRR  Lungs:  L/S CTA bilaterally Abd:  abd SNT/ND with normal BS GU:  Bladder non-palpable, positive Foley Extremities:  No LE edema. Skin:  No skin rash  MEDICATIONS:  . insulin aspart  0-9 Units Subcutaneous TID WC  . insulin aspart  5 Units Subcutaneous TID WC  . insulin detemir  30 Units Subcutaneous Daily  . pantoprazole  40 mg Oral Daily  . sodium bicarbonate  650 mg Oral BID  . sodium chloride flush  3 mL Intravenous Q12H  . tamsulosin  0.4 mg Oral QPC supper       LABS:   CBC Latest Ref Rng & Units 07/01/2018 06/30/2018 06/29/2018  WBC 4.0 - 10.5 K/uL 24.4(H) 26.7(H) 24.9(H)  Hemoglobin 13.0 - 17.0 g/dL 12.0(L) 11.7(L) 12.2(L)  Hematocrit 39.0 - 52.0 % 35.2(L) 32.3(L) 34.3(L)  Platelets 150 - 400 K/uL 530(H) 538(H) 557(H)    CMP Latest Ref Rng & Units 07/01/2018 06/30/2018 06/29/2018  Glucose 70 - 99 mg/dL 154(H) 315(H) 339(H)  BUN 6 - 20 mg/dL 50(H) 68(H) 74(H)  Creatinine 0.61 - 1.24 mg/dL 3.32(H) 4.01(H) 4.26(H)  Sodium 135 - 145 mmol/L 137 132(L) 129(L)  Potassium 3.5 - 5.1 mmol/L 3.3(L) 3.1(L) 3.2(L)  Chloride 98 - 111 mmol/L 98 99 100  CO2 22 - 32 mmol/L 27 21(L) 18(L)  Calcium 8.9 - 10.3 mg/dL 8.4(L) 8.0(L) 7.7(L)  Total Protein 6.5 - 8.1 g/dL - - -  Total Bilirubin 0.3 - 1.2 mg/dL - -  -  Alkaline Phos 38 - 126 U/L - - -  AST 15 - 41 U/L - - -  ALT 0 - 44 U/L - - -    Lab Results  Component Value Date   CALCIUM 8.4 (L) 07/01/2018   CAION 1.26 06/28/2018       Component Value Date/Time   COLORURINE YELLOW 06/28/2018 1626   APPEARANCEUR TURBID (A) 06/28/2018 1626   LABSPEC 1.013 06/28/2018 1626   PHURINE 6.0 06/28/2018 1626   GLUCOSEU >=500 (A) 06/28/2018 1626   HGBUR MODERATE (A) 06/28/2018 1626   BILIRUBINUR NEGATIVE 06/28/2018 1626   KETONESUR NEGATIVE 06/28/2018 1626   PROTEINUR 100 (A) 06/28/2018 1626   UROBILINOGEN 0.2 10/26/2013 0034   NITRITE NEGATIVE 06/28/2018 1626   LEUKOCYTESUR LARGE (A) 06/28/2018 1626      Component Value Date/Time   HCO3 11.8 (L) 06/28/2018 1558   TCO2 13 (L) 06/28/2018 1558   ACIDBASEDEF 16.0 (H) 06/28/2018 1558   O2SAT 62.0 06/28/2018 1558    No results found for: IRON, TIBC, FERRITIN, IRONPCTSAT     ASSESSMENT/PLAN:    58 year old male patient presents for DKA with persistent nausea, vomiting, and diarrhea for 5 days.  1.  Chronic kidney  disease stage III-IV with a baseline serum creatinine of 1.79 from 06/29/2015.  I suspect his renal function has worsened since that time.  Likely on the basis of longstanding diabetes.  Urinalysis is significant for proteinuria.  Will check SPEP for completeness which is still pending.  2.  Acute kidney injury.  Likely on the basis of volume depletion in the setting of nausea, vomiting, and diarrhea.    Renal function is improving.  Suspect bladder outlet obstruction was significant cause as well.  Maintain Foley.  3.  Metabolic acidosis.    Resolving.  Will transition to oral sodium bicarbonate.  4.  Urinary tract infection.  Continue IV Rocephin.  Did have urinary retention.    Marked bilateral hydronephrosis was noted on renal ultrasound.  Maintain Foley catheter.  Needs urology evaluation for possible exophytic mass in the left kidney.  Urology has been following.  5.  Acute  gastroenteritis.  Continue treatment of electrolytes and repletion.    [place], DO, [place]

## 2018-07-01 NOTE — Evaluation (Signed)
Physical Therapy Evaluation Patient Details Name: Timothy Madden MRN: [id number] DOB: [birth year] Today's Date: 07/01/2018   History of Present Illness  58 y.o. male admitted on 06/28/18 for N/V/D and found to have a blood glucose of 798 dx with DKA, acute on chronic renal failure, hyponatremia, hypokalemia, hypomagnesemia, UTI, and leukocytosis.  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    Clinical Impression  I found pt sitting near the elevators in a transport chair, bent over his RW.  He walked back to his room with me, slowly, min guard assist overall with RW.  He reports he needs a walker for home and did not feel safe trying to walk some for me without it.  He wants to go home and continues to say this throughout our session.  His hygene and self care seem poor as his clothes are very dirty and smell of urine.  He reports to me he is trying to get on disability and is not currently working.  He would benefit from acute therapy during his hospitalization, but does not need any post acute follow up.   PT to follow acutely for deficits listed below.      Follow Up Recommendations No PT follow up    Equipment Recommendations  Rolling walker with 5" wheels    Recommendations for Other Services   NA    Precautions / Restrictions   None      Mobility  Bed Mobility               General bed mobility comments: NT, pt found sitting in a WC near the front desk, staff reporting he has been wandering the unit.    Transfers Overall transfer level: Needs assistance Equipment used: Rolling walker (2 wheeled) Transfers: Sit to/from Stand Sit to Stand: Min guard         General transfer comment: Min guard assist for safety as it took him multiple attempts to get to standing.   Ambulation/Gait Ambulation/Gait assistance: Min guard Gait Distance (Feet): 200 Feet Assistive device: Rolling walker (2 wheeled) Gait Pattern/deviations: Step-through pattern;Trunk  flexed Gait velocity: decreased Gait velocity interpretation: 1.31 - 2.62 ft/sec, indicative of limited community ambulator General Gait Details: Pt reports he needs a RW at home and does not feel he can ambulate for me without it.           Balance Overall balance assessment: Needs assistance Sitting-balance support: Feet supported;No upper extremity supported Sitting balance-Leahy Scale: Good     Standing balance support: Bilateral upper extremity supported;No upper extremity supported;Single extremity supported Standing balance-Leahy Scale: Fair                               Pertinent Vitals/Pain Pain Assessment: No/denies pain    Home Living Family/patient expects to be discharged to:: Private residence Living Arrangements: Parent(mother's home) Available Help at Discharge: Family;Available 24 hours/day Type of Home: House Home Access: Stairs to enter Entrance Stairs-Rails: Right Entrance Stairs-Number of Steps: 4 Home Layout: One level Home Equipment: None Additional Comments: Self care seems to be lacking as his clothes he is wearing from home are very dirty and smell of urine.     Prior Function Level of Independence: Independent         Comments: takes the bus to go around town. He is trying to get disability, not currently working. Reports his mother cooks  Extremity/Trunk Assessment   Upper Extremity Assessment Upper Extremity Assessment: Generalized weakness    Lower Extremity Assessment Lower Extremity Assessment: Generalized weakness    Cervical / Trunk Assessment Cervical / Trunk Assessment: Normal  Communication   Communication: No difficulties  Cognition Arousal/Alertness: Awake/alert Behavior During Therapy: Flat affect Overall Cognitive Status: Within Functional Limits for tasks assessed                                 General Comments: Not specifically tested, conversation normal.               Assessment/Plan    PT Assessment Patient needs continued PT services  PT Problem List Decreased strength;Decreased activity tolerance;Decreased balance;Decreased mobility;Decreased knowledge of use of DME       PT Treatment Interventions DME instruction;Gait training;Stair training;Functional mobility training;Therapeutic activities;Therapeutic exercise;Balance training;Patient/family education    PT Goals (Current goals can be found in the Care Plan section)  Acute Rehab PT Goals Patient Stated Goal: wants to go home ASAP PT Goal Formulation: With patient Time For Goal Achievement: 07/15/18 Potential to Achieve Goals: Good    Frequency Min 3X/week           AM-PAC PT "6 Clicks" Mobility  Outcome Measure Help needed turning from your back to your side while in a flat bed without using bedrails?: A Little Help needed moving from lying on your back to sitting on the side of a flat bed without using bedrails?: A Little Help needed moving to and from a bed to a chair (including a wheelchair)?: A Little Help needed standing up from a chair using your arms (e.g., wheelchair or bedside chair)?: A Little Help needed to walk in hospital room?: A Little Help needed climbing 3-5 steps with a railing? : A Little 6 Click Score: 18    End of Session   Activity Tolerance: Patient tolerated treatment well Patient left: in bed;Other (comment);with bed alarm set(seated EOB)   PT Visit Diagnosis: Muscle weakness (generalized) (M62.81);Difficulty in walking, not elsewhere classified (R26.2)    Time: 3734-2876 PT Time Calculation (min) (ACUTE ONLY): 9 min   Charges:       Wells Guiles B. Veleka Djordjevic, PT, DPT  Acute Rehabilitation [phone] pager [phone]) [phone] office  @ [facility]: [phone]   PT Evaluation $PT Eval Moderate Complexity: 1 Mod          07/01/2018, 4:12 PM

## 2018-07-01 NOTE — Plan of Care (Signed)

## 2018-07-01 NOTE — Progress Notes (Signed)
Patient ID: Timothy Madden, male   DOB: [birth year], 58 y.o.   MRN: [id number]  PROGRESS NOTE    Timothy Madden  WYO:[id number] DOB: 05/07/1960 DOA: 06/28/2018 PCP: Marliss Coots, NP   Brief Narrative:  58 year old male with history of diabetes mellitus type 2 presented with nausea, vomiting and diarrhea.  He was found to have blood glucose of 798, anion gap unable to be calculated, CO2 of less than 7, sodium 117, BUN 118, creatinine of 6.75 with white count of 21.9 on presentation.  He was started on intravenous fluids and insulin drip.  Renal ultrasound showed bilateral hydronephrosis.  Nephrology and urology were consulted.  Foley catheter was placed.  Assessment & Plan:   Principal Problem:   DKA, type 2 ([place]) Active Problems:   TOBACCO ABUSE   Acute renal failure superimposed on chronic kidney disease (HCC)   Elevated lipase  DKA in a patient with diabetes mellitus type 2 and noncompliance -Patient apparently does not have a PCP and has not been taking his medications recently. -Presented with blood glucose of 798 with bicarb of less than 7 and anion gap unable to be calculated. -Started on insulin drip which was subsequently transitioned to long-acting insulin.  Blood sugar still on the high side.  Increase Levemir to 30 units daily.  Continue NovoLog 5 units 3 times a day with meals.  Continue CBGs with SSI.  Hemoglobin A1c 14.6.  Diabetes coordinator consulted.  Tolerating diet.  Continue carb modified diet.   -Care management consulted as patient does not have PCP.  Acute renal failure superimposed on chronic kidney disease stage III Metabolic acidosis -Patient's last creatinine was 1.79 in 06/2015.  Presented with creatinine of 6.75 -Creatinine 3.32 this morning. -Making urine.  Acidosis has much improved.  Off bicarb drip.  Nephrology following.  Hyponatremia-probably from above.  Improved  Hypokalemia -Replace.  Repeat a.m. labs  Hypomagnesemia -Improved  UTI  Acute urinary retention with bilateral hydronephrosis seen on renal ultrasound -Continue Rocephin.  Urine culture growing staph epidermidis.  -Continue Foley catheter for now which is draining intermittently blood-tinged urine.  Urology evaluation appreciated.  Continue Flomax.  Outpatient follow-up with urology.  Leukocytosis -Probably from above.  Only slight improvement.  Monitor  Nausea, vomiting, diarrhea -Most likely from DKA versus viral gastroenteritis.  Improving.  Antiemetics as needed.  Tobacco abuse -We will need to counsel about tobacco cessation.   DVT prophylaxis: Heparin discontinued due to hematuria.  SCDs Code Status: Full Family Communication: None at bedside Disposition Plan: Home in 2 to 3 days if clinically improves.  Consultants: Urology/nephrology  Procedures: None  Antimicrobials: Rocephin from 06/28/2018 onwards   Subjective: Patient seen and examined at bedside.  He is a poor historian.  No overnight fever, vomiting, worsening abdominal pain or diarrhea reported.   Objective: Vitals:   06/30/18 0525 06/30/18 1512 06/30/18 2105 07/01/18 0513  BP:  109/69 105/79 110/80  Pulse:  92 81 93  Resp:  20 18 16   Temp:  98.6 F (37 C) 98.7 F (37.1 C) 98.3 F (36.8 C)  TempSrc:  Oral Oral Oral  SpO2:  100% 99% 100%  Weight: 68.1 kg   64.4 kg  Height:        Intake/Output Summary (Last 24 hours) at 07/01/2018 0730 Last data filed at 07/01/2018 0517 Gross per 24 hour  Intake 2907.5 ml  Output 1500 ml  Net 1407.5 ml   Filed Weights   06/28/18 1502 06/30/18 0525 07/01/18 0513  Weight:  79.3 kg 68.1 kg 64.4 kg    Examination:  General exam: No distress.  Looks older than stated age.  Looks chronically ill.  Very poor historian.  Very thinly built. Respiratory system: Bilateral decreased breath sounds at bases with some crackles  cardiovascular system: Rate controlled, S1-S2 heard  gastrointestinal system: Abdomen is nondistended, soft and  nontender. Normal bowel sounds heard. Extremities: No cyanosis, edema Central nervous system: Alert and oriented.  Poor historian.  No focal neurological deficits. Moving extremities Skin: No rashes, lesions or ulcers Psychiatry: Very flat affect.  Makes very less conversation.    Data Reviewed: I have personally reviewed following labs and imaging studies  CBC: Recent Labs  Lab 06/28/18 1318 06/28/18 1558 06/29/18 0450 06/30/18 0339 07/01/18 0242  WBC 21.9*  --  24.9* 26.7* 24.4*  NEUTROABS  --   --   --  22.7* 20.6*  HGB 13.5 14.3 12.2* 11.7* 12.0*  HCT 40.8 42.0 34.3* 32.3* 35.2*  MCV 83.8  --  78.5* 77.3* 80.4  PLT 560*  --  557* 538* 027*   Basic Metabolic Panel: Recent Labs  Lab 06/28/18 2358 06/29/18 0450 06/29/18 1905 06/30/18 0339 07/01/18 0242  NA 132* 131* 129* 132* 137  K 3.1* 3.1* 3.2* 3.1* 3.3*  CL 105 104 100 99 98  CO2 12* 14* 18* 21* 27  GLUCOSE 112* 181* 339* 315* 154*  BUN 99* 90* 74* 68* 50*  CREATININE 5.26* 4.96* 4.26* 4.01* 3.32*  CALCIUM 8.7* 8.4* 7.7* 8.0* 8.4*  MG  --   --   --  1.5* 1.9   GFR: Estimated Creatinine Clearance: 22.4 mL/min (A) (by C-G formula based on SCr of 3.32 mg/dL (H)). Liver Function Tests: Recent Labs  Lab 06/28/18 1318  AST 13*  ALT 10  ALKPHOS 96  BILITOT 0.9  PROT 8.5*  ALBUMIN 2.9*   Recent Labs  Lab 06/28/18 1318  LIPASE 161*   No results for input(s): AMMONIA in the last 168 hours. Coagulation Profile: No results for input(s): INR, PROTIME in the last 168 hours. Cardiac Enzymes: No results for input(s): CKTOTAL, CKMB, CKMBINDEX, TROPONINI in the last 168 hours. BNP (last 3 results) No results for input(s): PROBNP in the last 8760 hours. HbA1C: Recent Labs    06/29/18 0450  HGBA1C 14.6*   CBG: Recent Labs  Lab 06/29/18 1709 06/29/18 2052 06/30/18 0748 06/30/18 1154 06/30/18 1655  GLUCAP 326* 305* 256* 325* 232*   Lipid Profile: No results for input(s): CHOL, HDL, LDLCALC, TRIG,  CHOLHDL, LDLDIRECT in the last 72 hours. Thyroid Function Tests: Recent Labs    06/28/18 1822  TSH 0.797   Anemia Panel: No results for input(s): VITAMINB12, FOLATE, FERRITIN, TIBC, IRON, RETICCTPCT in the last 72 hours. Sepsis Labs: No results for input(s): PROCALCITON, LATICACIDVEN in the last 168 hours.  Recent Results (from the past 240 hour(s))  Novel Coronavirus,NAA,(SEND-OUT TO REF LAB - TAT 24-48 hrs); Hosp Order     Status: None   Collection Time: 06/28/18  3:44 PM   Specimen: Nasopharyngeal Swab; Respiratory  Result Value Ref Range Status   SARS-CoV-2, NAA NOT DETECTED NOT DETECTED Final    Comment: (NOTE) This test was developed and its performance characteristics determined by [employer]. This test has not been FDA cleared or approved. This test has been authorized by FDA under an Emergency Use Authorization (EUA). This test is only authorized for the duration of time the declaration that circumstances exist justifying the authorization of the emergency use of  in vitro diagnostic tests for detection of SARS-CoV-2 virus and/or diagnosis of COVID-19 infection under section 564(b)(1) of the Act, 21 U.S.C. 567OLI-1(C)(3), unless the authorization is terminated or revoked sooner. When diagnostic testing is negative, the possibility of a false negative result should be considered in the context of a patient's recent exposures and the presence of clinical signs and symptoms consistent with COVID-19. An individual without symptoms of COVID-19 and who is not shedding SARS-CoV-2 virus would expect to have a negative (not detected) result in this assay. Performed  At: [facility] [address] [place], [place] [id number] Rush Farmer MD LN:[id number]    [place]  Final    Comment: Performed at [facility], [place] [address], [place], [place] [address]  Urine Culture     Status: Abnormal   Collection Time: 06/28/18  4:29 PM    Specimen: Urine, Random  Result Value Ref Range Status   Specimen Description URINE, RANDOM  Final   Special Requests   Final    NONE Performed at [facility], [place] [address] [place], [place] [address]    Culture >=100,000 COLONIES/mL STAPHYLOCOCCUS EPIDERMIDIS (A)  Final   Report Status 06/30/2018 FINAL  Final   Organism ID, Bacteria STAPHYLOCOCCUS EPIDERMIDIS (A)  Final      Susceptibility   Staphylococcus epidermidis - MIC*    CIPROFLOXACIN <=0.5 SENSITIVE Sensitive     GENTAMICIN <=0.5 SENSITIVE Sensitive     NITROFURANTOIN <=16 SENSITIVE Sensitive     OXACILLIN <=0.25 SENSITIVE Sensitive     TETRACYCLINE <=1 SENSITIVE Sensitive     VANCOMYCIN 1 SENSITIVE Sensitive     TRIMETH/SULFA 20 SENSITIVE Sensitive     CLINDAMYCIN <=0.25 SENSITIVE Sensitive     RIFAMPIN <=0.5 SENSITIVE Sensitive     Inducible Clindamycin NEGATIVE Sensitive     * >=100,000 COLONIES/mL STAPHYLOCOCCUS EPIDERMIDIS         Radiology Studies: No results found.      Scheduled Meds: . insulin aspart  0-9 Units Subcutaneous TID WC  . insulin aspart  5 Units Subcutaneous TID WC  . insulin detemir  25 Units Subcutaneous Daily  . pantoprazole (PROTONIX) IV  40 mg Intravenous Q12H  . sodium bicarbonate  650 mg Oral BID  . sodium chloride flush  3 mL Intravenous Q12H  . tamsulosin  0.4 mg Oral QPC supper   Continuous Infusions: . cefTRIAXone (ROCEPHIN)  IV 1 g (06/30/18 1928)     LOS: 3 days        Aline August, MD [facility] 07/01/2018, 7:30 AM

## 2018-07-02 DIAGNOSIS — F17200 Nicotine dependence, unspecified, uncomplicated: Secondary | ICD-10-CM

## 2018-07-02 LAB — CBC WITH DIFFERENTIAL/PLATELET
Abs Immature Granulocytes: 0.37 10*3/uL — ABNORMAL HIGH (ref 0.00–0.07)
Basophils Absolute: 0.1 10*3/uL (ref 0.0–0.1)
Basophils Relative: 1 %
Eosinophils Absolute: 0.4 10*3/uL (ref 0.0–0.5)
Eosinophils Relative: 2 %
HCT: 35.4 % — ABNORMAL LOW (ref 39.0–52.0)
Hemoglobin: 12 g/dL — ABNORMAL LOW (ref 13.0–17.0)
Immature Granulocytes: 2 %
Lymphocytes Relative: 10 %
Lymphs Abs: 2.3 10*3/uL (ref 0.7–4.0)
MCH: 27.9 pg (ref 26.0–34.0)
MCHC: 33.9 g/dL (ref 30.0–36.0)
MCV: 82.3 fL (ref 80.0–100.0)
Monocytes Absolute: 1.5 10*3/uL — ABNORMAL HIGH (ref 0.1–1.0)
Monocytes Relative: 7 %
Neutro Abs: 18.8 10*3/uL — ABNORMAL HIGH (ref 1.7–7.7)
Neutrophils Relative %: 78 %
Platelets: 541 10*3/uL — ABNORMAL HIGH (ref 150–400)
RBC: 4.3 MIL/uL (ref 4.22–5.81)
RDW: 13 % (ref 11.5–15.5)
WBC: 23.5 10*3/uL — ABNORMAL HIGH (ref 4.0–10.5)
nRBC: 0 % (ref 0.0–0.2)

## 2018-07-02 LAB — BASIC METABOLIC PANEL
Anion gap: 11 (ref 5–15)
BUN: 48 mg/dL — ABNORMAL HIGH (ref 6–20)
CO2: 28 mmol/L (ref 22–32)
Calcium: 8.8 mg/dL — ABNORMAL LOW (ref 8.9–10.3)
Chloride: 96 mmol/L — ABNORMAL LOW (ref 98–111)
Creatinine, Ser: 3.62 mg/dL — ABNORMAL HIGH (ref 0.61–1.24)
GFR calc Af Amer: 20 mL/min — ABNORMAL LOW (ref >60)
GFR calc non Af Amer: 18 mL/min — ABNORMAL LOW (ref >60)
Glucose, Bld: 167 mg/dL — ABNORMAL HIGH (ref 70–99)
Potassium: 3.9 mmol/L (ref 3.5–5.1)
Sodium: 135 mmol/L (ref 135–145)

## 2018-07-02 LAB — GLUCOSE, CAPILLARY
Glucose-Capillary: 234 mg/dL — ABNORMAL HIGH (ref 70–99)
Glucose-Capillary: 125 mg/dL — ABNORMAL HIGH (ref 70–99)
Glucose-Capillary: 302 mg/dL — ABNORMAL HIGH (ref 70–99)
Glucose-Capillary: 157 mg/dL — ABNORMAL HIGH (ref 70–99)

## 2018-07-02 LAB — MAGNESIUM: Magnesium: 1.7 mg/dL (ref 1.7–2.4)

## 2018-07-02 MED ORDER — TAMSULOSIN HCL 0.4 MG PO CAPS: 0.4000 mg | ORAL_CAPSULE | Freq: Every day | ORAL | 0 refills | Status: DC

## 2018-07-02 MED ORDER — CEPHALEXIN 250 MG PO CAPS: 250.0000 mg | ORAL_CAPSULE | Freq: Two times a day (BID) | ORAL | 0 refills | Status: AC

## 2018-07-02 MED ORDER — ONDANSETRON HCL 4 MG PO TABS: 4.0000 mg | ORAL_TABLET | Freq: Four times a day (QID) | ORAL | 0 refills | Status: DC | PRN

## 2018-07-02 MED ORDER — BLOOD GLUCOSE MONITOR KIT: PACK | 0 refills | Status: AC

## 2018-07-02 MED ORDER — "INSULIN SYRINGE 31G X 5/16"" 0.5 ML MISC": 0 refills | Status: DC

## 2018-07-02 MED ORDER — INSULIN DETEMIR 100 UNIT/ML ~~LOC~~ SOLN: 30.0000 [IU] | Freq: Every day | SUBCUTANEOUS | 0 refills | Status: DC

## 2018-07-02 MED ORDER — INSULIN ASPART 100 UNIT/ML ~~LOC~~ SOLN: 5.0000 [IU] | Freq: Three times a day (TID) | SUBCUTANEOUS | 0 refills | Status: DC

## 2018-07-02 MED FILL — ONDANSETRON HCL 4 MG TABLET: 4 | 5 days supply | Qty: 20 | Fill #0

## 2018-07-02 MED FILL — NovoLOG 100 UNIT/ML SOLN: 100 | 28 days supply | Qty: 10 | Fill #0

## 2018-07-02 MED FILL — ULTICARE INS 0.3 ML 30GX1/2: 30G X 1/2" | 25 days supply | Qty: 100 | Fill #0

## 2018-07-02 MED FILL — LEVEMIR 100 UNITS/ML VIAL: 100 | 28 days supply | Qty: 10 | Fill #0

## 2018-07-02 MED FILL — TAMSULOSIN HCL 0.4 MG CAP: 0.4 | 30 days supply | Qty: 30 | Fill #0

## 2018-07-02 MED FILL — CEPHALEXIN 250 MG CAPSULE: 250 | 3 days supply | Qty: 6 | Fill #0

## 2018-07-02 NOTE — Discharge Summary (Addendum)
Physician Discharge Summary  Timothy Madden IWL:[id number] DOB: 09-07-1960 DOA: 06/28/2018  PCP: Marliss Coots, NP  Admit date: 06/28/2018 Discharge date: 07/02/2018  Admitted From: Home Disposition: Home  Recommendations for Outpatient Follow-up:  1. Follow up with PCP in 1 week with repeat CBC/BMP 2. Outpatient follow-up with urology. 3. Patient will benefit from outpatient evaluation and follow-up by nephrology. 4. Follow up in ED if symptoms worsen or new appear   Home Health: No Equipment/Devices: Foley catheter  Discharge Condition: Guarded CODE STATUS: Full Diet recommendation: Heart healthy/carb modified  Brief/Interim Summary: 58 year old male with history of diabetes mellitus type 2 presented with nausea, vomiting and diarrhea.  He was found to have blood glucose of 798, anion gap unable to be calculated, CO2 of less than 7, sodium 117, BUN 118, creatinine of 6.75 with white count of 21.9 on presentation.  He was started on intravenous fluids and insulin drip.  Renal ultrasound showed bilateral hydronephrosis.  Nephrology and urology were consulted.  Foley catheter was placed.Urology recommended Flomax and outpatient follow-up with urology with indwelling Foley.  He was treated with intravenous fluids.  His kidney function has gradually improved.  Nephrology has cleared the patient for discharge.  He will be discharged on long-acting and short-acting insulin.  Discharge Diagnoses:  Principal Problem:   DKA, type 2 ([place]) Active Problems:   TOBACCO ABUSE   Acute renal failure superimposed on chronic kidney disease (HCC)   Elevated lipase  DKA in a patient with diabetes mellitus type 2 and noncompliance -Patient apparently did not have a PCP and was not been taking his medications recently. -Presented with blood glucose of 798 with bicarb of less than 7 and anion gap unable to be calculated. -Started on insulin drip which was subsequently transitioned to long-acting  insulin.  Blood sugar still on the high side.    Continue Levemir 30 units daily. Continue NovoLog 5 units 3 times a day with meals.  Hemoglobin A1c 14.6.  Diabetes coordinator consulted.  Tolerating diet.  Continue carb modified diet.   -Care management consulted as patient does not have PCP. -Outpatient follow-up with PCP regarding further management.  Acute renal failure superimposed on chronic kidney disease stage III/4 Metabolic acidosis -Patient's last creatinine was 1.79 in 06/2015.  Presented with creatinine of 6.75 -Creatinine 3.62 this morning. -Making urine.  Acidosis has much improved.  Off bicarb drip.  Currently on oral bicarb.  Will DC oral bicarb, nephrology okay with this.  Nephrology has cleared the patient for discharge.  Patient would benefit from outpatient BMP follow-up and outpatient nephrology evaluation and follow-up.  Hyponatremia-probably from above.  Improved  Hypokalemia -Replace.  Repeat a.m. 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  Urine culture growing staph epidermidis.  Will discharge on Keflex for 3 more days. -Continue Foley catheter for now which is draining intermittently blood-tinged urine but improving.  Urology evaluation appreciated.  Continue Flomax.  Outpatient follow-up with urology with indwelling Foley catheter. -Outpatient follow-up with urology regarding management of the hypoechoic mass in the left kidney.  Leukocytosis -Probably from above.  Only slight improvement.    Outpatient follow-up.  Nausea, vomiting, diarrhea -Most likely from DKA versus viral gastroenteritis.    Improved.   Tobacco abuse -We will need to counsel about tobacco cessation.   Discharge Instructions  Discharge Instructions    Ambulatory referral to Urology   Complete by:  As directed    Diet - low sodium  heart healthy   Complete by: As directed    Diet Carb Modified   Complete by: As directed    Increase activity slowly   Complete by: As directed      Allergies as of 07/02/2018   No Known Allergies     Medication List    STOP taking these medications   glucose monitoring kit monitoring kit   insulin aspart 100 UNIT/ML FlexPen Commonly known as: NovoLOG FlexPen Replaced by: insulin aspart 100 UNIT/ML injection   Insulin Glargine 100 UNIT/ML Solostar Pen Commonly known as: Lantus SoloStar   Insulin Pen Needle 30G X 8 MM Misc Commonly known as: NovoFine   metFORMIN 500 MG tablet Commonly known as: GLUCOPHAGE   Vitamin D (Ergocalciferol) 1.25 MG (50000 UT) Caps capsule Commonly known as: DRISDOL     TAKE these medications   blood glucose meter kit and supplies Kit Dispense based on patient and insurance preference. Use up to four times daily as directed. (FOR ICD-9 250.00, 250.01).   cephALEXin 250 MG capsule Commonly known as: KEFLEX Take 1 capsule (250 mg total) by mouth 2 (two) times daily for 3 days.   insulin aspart 100 UNIT/ML injection Commonly known as: novoLOG Inject 5 Units into the skin 3 (three) times daily with meals. Replaces: insulin aspart 100 UNIT/ML FlexPen   insulin detemir 100 UNIT/ML injection Commonly known as: LEVEMIR Inject 0.3 mLs (30 Units total) into the skin daily. Start taking on: July 03, 2018   INSULIN SYRINGE .5CC/31GX5/16" 31G X 5/16" 0.5 ML Misc Levemir 30 units daily and NovoLog 5 units 3 times a day with meals   ondansetron 4 MG tablet Commonly known as: ZOFRAN Take 1 tablet (4 mg total) by mouth every 6 (six) hours as needed for nausea.   tamsulosin 0.4 MG Caps capsule Commonly known as: FLOMAX Take 1 capsule (0.4 mg total) by mouth daily after supper.      Follow-up Information    Placey, Audrea Muscat, NP.   Contact information: [place] [address] [phone]        [facility] Follow up on 07/06/2018.   Why: 9:30 am, Juluis Mire NP ( VIRTUAL VISIT) with repeat cbc/bmp Contact information: [place] [phone]       Cleon Gustin, MD. Schedule an appointment as soon as possible for a visit in 1 week(s).   Specialty: Urology Contact information: [address] [place] [address] [phone]        [place], [place]. Schedule an appointment as soon as possible for a visit in 1 week(s).   Contact information: [place] [address] [phone]          No Known Allergies  Consultations:  Urology/nephrology   Procedures/Studies: US Renal  Result Date: 06/28/2018 CLINICAL DATA:  Nausea EXAM: RENAL / URINARY TRACT ULTRASOUND COMPLETE COMPARISON:  None. FINDINGS: Right Kidney: Renal measurements: 13.9 x 6 x 7 cm = volume: 308.7 mL. Cortical echogenicity normal. Marked hydronephrosis. Left Kidney: Renal measurements: 12.8 x 6.7 x 5.6 cm = volume: 248.1 mL. Cortical echogenicity normal. Marked hydronephrosis. Possible partially exophytic hypoechoic mass mid left kidney measuring 2.1 cm. Bladder: Foley catheter in the bladder.  Severe wall thickening up to 1.8 cm IMPRESSION: 1. Marked bilateral hydronephrosis. Severe bladder wall thickening which may be secondary to cystitis, chronic obstruction, or neoplasm. 2. Possible hypoechoic  mass in the mid left kidney. Further evaluation with renal CT or MRI suggested when clinically feasible. Electronically Signed   By: Donavan Foil M.D.   On: 06/28/2018 19:39     Subjective: Patient seen and examined at bedside.  He wants to go home today.  He is a poor historian.  He wanted the Foley catheter removed but I reiterated that Foley catheter will not be removed on discharge.  No overnight fever, vomiting or diarrhea reported.  Discharge Exam: Vitals:   07/01/18 2207 07/02/18 0614  BP: 103/78 96/68  Pulse: 98 90  Resp: 17  18  Temp: 99.2 F (37.3 C) 98.9 F (37.2 C)  SpO2: 100% 100%   General exam: No distress.  Looks older than stated age.  Looks chronically ill.  Very poor historian.  Very thinly built. Respiratory system: Bilateral decreased breath sounds at bases with some crackles  cardiovascular system: Rate controlled, S1-S2 heard  gastrointestinal system: Abdomen is nondistended, soft and nontender. Normal bowel sounds heard. Extremities: No cyanosis, edema    The results of significant diagnostics from this hospitalization (including imaging, microbiology, ancillary and laboratory) are listed below for reference.     Microbiology: Recent Results (from the past 240 hour(s))  Novel Coronavirus,NAA,(SEND-OUT TO REF LAB - TAT 24-48 hrs); Hosp Order     Status: None   Collection Time: 06/28/18  3:44 PM   Specimen: Nasopharyngeal Swab; Respiratory  Result Value Ref Range Status   SARS-CoV-2, NAA NOT DETECTED NOT DETECTED Final    Comment: (NOTE) This test was developed and its performance characteristics determined by [employer]. This test has not been FDA cleared or approved. This test has been authorized by FDA under an Emergency Use Authorization (EUA). This test is only authorized for the duration of time the declaration that circumstances exist justifying the authorization of the emergency use of in vitro diagnostic tests for detection of SARS-CoV-2 virus and/or diagnosis of COVID-19 infection under section 564(b)(1) of the Act, 21 U.S.C. 034VQQ-5(Z)(5), unless the authorization is terminated or revoked sooner. When diagnostic testing is negative, the possibility of a false negative result should be considered in the context of a patient's recent exposures and the presence of clinical signs and symptoms consistent with COVID-19. An individual without symptoms of COVID-19 and who is not shedding SARS-CoV-2 virus would expect to have a negative (not detected) result in this  assay. Performed  At: [facility] [address] [name], [place] [id number] Rush Farmer MD IR:[id number]    [place]  Final    Comment: Performed at [facility], [place] [address], [place], [place] [address]  Urine Culture     Status: Abnormal   Collection Time: 06/28/18  4:29 PM   Specimen: Urine, Random  Result Value Ref Range Status   Specimen Description URINE, RANDOM  Final   Special Requests   Final    NONE Performed at [facility], [place] [address], [place], [place] [address]    Culture >=100,000 COLONIES/mL STAPHYLOCOCCUS EPIDERMIDIS (A)  Final   Report Status 06/30/2018 FINAL  Final   Organism ID, Bacteria STAPHYLOCOCCUS EPIDERMIDIS (A)  Final      Susceptibility   Staphylococcus epidermidis - MIC*    CIPROFLOXACIN <=0.5 SENSITIVE Sensitive     GENTAMICIN <=0.5 SENSITIVE Sensitive     NITROFURANTOIN <=16 SENSITIVE Sensitive     OXACILLIN <=0.25 SENSITIVE Sensitive     TETRACYCLINE <=1 SENSITIVE Sensitive     VANCOMYCIN 1 SENSITIVE  Sensitive     TRIMETH/SULFA 20 SENSITIVE Sensitive     CLINDAMYCIN <=0.25 SENSITIVE Sensitive     RIFAMPIN <=0.5 SENSITIVE Sensitive     Inducible Clindamycin NEGATIVE Sensitive     * >=100,000 COLONIES/mL STAPHYLOCOCCUS EPIDERMIDIS     Labs: BNP (last 3 results) No results for input(s): BNP in the last 8760 hours. Basic Metabolic Panel: Recent Labs  Lab 06/29/18 0450 06/29/18 1905 06/30/18 0339 07/01/18 0242 07/02/18 0242  NA 131* 129* 132* 137 135  K 3.1* 3.2* 3.1* 3.3* 3.9  CL 104 100 99 98 96*  CO2 14* 18* 21* 27 28  GLUCOSE 181* 339* 315* 154* 167*  BUN 90* 74* 68* 50* 48*  CREATININE 4.96* 4.26* 4.01* 3.32* 3.62*  CALCIUM 8.4* 7.7* 8.0* 8.4* 8.8*  MG  --   --  1.5* 1.9 1.7   Liver Function Tests: Recent Labs  Lab 06/28/18 1318  AST 13*  ALT 10  ALKPHOS 96  BILITOT 0.9  PROT 8.5*  ALBUMIN 2.9*   Recent Labs  Lab 06/28/18 1318  LIPASE 161*   No results for  input(s): AMMONIA in the last 168 hours. CBC: Recent Labs  Lab 06/28/18 1318 06/28/18 1558 06/29/18 0450 06/30/18 0339 07/01/18 0242 07/02/18 0242  WBC 21.9*  --  24.9* 26.7* 24.4* 23.5*  NEUTROABS  --   --   --  22.7* 20.6* 18.8*  HGB 13.5 14.3 12.2* 11.7* 12.0* 12.0*  HCT 40.8 42.0 34.3* 32.3* 35.2* 35.4*  MCV 83.8  --  78.5* 77.3* 80.4 82.3  PLT 560*  --  557* 538* 530* 541*   Cardiac Enzymes: No results for input(s): CKTOTAL, CKMB, CKMBINDEX, TROPONINI in the last 168 hours. BNP: Invalid input(s): POCBNP CBG: Recent Labs  Lab 07/01/18 1159 07/01/18 1730 07/01/18 2226 07/02/18 0800 07/02/18 1207  GLUCAP 424* 93 191* 125* 302*   D-Dimer No results for input(s): DDIMER in the last 72 hours. Hgb A1c No results for input(s): HGBA1C in the last 72 hours. Lipid Profile No results for input(s): CHOL, HDL, LDLCALC, TRIG, CHOLHDL, LDLDIRECT in the last 72 hours. Thyroid function studies No results for input(s): TSH, T4TOTAL, T3FREE, THYROIDAB in the last 72 hours.  Invalid input(s): FREET3 Anemia work up No results for input(s): VITAMINB12, FOLATE, FERRITIN, TIBC, IRON, RETICCTPCT in the last 72 hours. Urinalysis    Component Value Date/Time   COLORURINE YELLOW 06/28/2018 1626   APPEARANCEUR TURBID (A) 06/28/2018 1626   LABSPEC 1.013 06/28/2018 1626   PHURINE 6.0 06/28/2018 1626   GLUCOSEU >=500 (A) 06/28/2018 1626   HGBUR MODERATE (A) 06/28/2018 1626   BILIRUBINUR NEGATIVE 06/28/2018 1626   KETONESUR NEGATIVE 06/28/2018 1626   PROTEINUR 100 (A) 06/28/2018 1626   UROBILINOGEN 0.2 10/26/2013 0034   NITRITE NEGATIVE 06/28/2018 1626   LEUKOCYTESUR LARGE (A) 06/28/2018 1626   Sepsis Labs Invalid input(s): PROCALCITONIN,  WBC,  LACTICIDVEN Microbiology Recent Results (from the past 240 hour(s))  Novel Coronavirus,NAA,(SEND-OUT TO REF LAB - TAT 24-48 hrs); Hosp Order     Status: None   Collection Time: 06/28/18  3:44 PM   Specimen: Nasopharyngeal Swab; Respiratory   Result Value Ref Range Status   SARS-CoV-2, NAA NOT DETECTED NOT DETECTED Final    Comment: (NOTE) This test was developed and its performance characteristics determined by [employer]. This test has not been FDA cleared or approved. This test has been authorized by FDA under an Emergency Use Authorization (EUA). This test is only authorized for the duration of time the declaration  that circumstances exist justifying the authorization of the emergency use of in vitro diagnostic tests for detection of SARS-CoV-2 virus and/or diagnosis of COVID-19 infection under section 564(b)(1) of the Act, 21 U.S.C. 634ZQJ-4(I)(7), unless the authorization is terminated or revoked sooner. When diagnostic testing is negative, the possibility of a false negative result should be considered in the context of a patient's recent exposures and the presence of clinical signs and symptoms consistent with COVID-19. An individual without symptoms of COVID-19 and who is not shedding SARS-CoV-2 virus would expect to have a negative (not detected) result in this assay. Performed  At: [facility] [address] [place], [place] [id number] Rush Farmer MD WH:[id number]    [place]  Final    Comment: Performed at [facility], [place] [address], [place], [place] [address]  Urine Culture     Status: Abnormal   Collection Time: 06/28/18  4:29 PM   Specimen: Urine, Random  Result Value Ref Range Status   Specimen Description URINE, RANDOM  Final   Special Requests   Final    NONE Performed at [facility], [place] [address] [place], [place] [address]    Culture >=100,000 COLONIES/mL STAPHYLOCOCCUS EPIDERMIDIS (A)  Final   Report Status 06/30/2018 FINAL  Final   Organism ID, Bacteria STAPHYLOCOCCUS EPIDERMIDIS (A)  Final      Susceptibility   Staphylococcus epidermidis - MIC*    CIPROFLOXACIN <=0.5 SENSITIVE Sensitive     GENTAMICIN <=0.5 SENSITIVE Sensitive      NITROFURANTOIN <=16 SENSITIVE Sensitive     OXACILLIN <=0.25 SENSITIVE Sensitive     TETRACYCLINE <=1 SENSITIVE Sensitive     VANCOMYCIN 1 SENSITIVE Sensitive     TRIMETH/SULFA 20 SENSITIVE Sensitive     CLINDAMYCIN <=0.25 SENSITIVE Sensitive     RIFAMPIN <=0.5 SENSITIVE Sensitive     Inducible Clindamycin NEGATIVE Sensitive     * >=100,000 COLONIES/mL STAPHYLOCOCCUS EPIDERMIDIS     Time coordinating discharge: 35 minutes  SIGNED:   Aline August, MD  [facility] 07/02/2018, 1:34 PM

## 2018-07-02 NOTE — Progress Notes (Signed)
[facility]    NEPHROLOGY PROGRESS NOTE  SUBJECTIVE: Patient seen and examined.  No acute complaints today.  Denies chest pain, shortness of breath, nausea, vomiting, diarrhea or dysuria.  All other review of systems are negative.  Hopeful discharge today.     OBJECTIVE:  Vitals:   07/02/18 0614 07/02/18 1419  BP: 96/68 98/67  Pulse: 90 86  Resp: 18 17  Temp: 98.9 F (37.2 C) 98.7 F (37.1 C)  SpO2: 100% 100%    Intake/Output Summary (Last 24 hours) at 07/02/2018 1546 Last data filed at 07/02/2018 1300 Gross per 24 hour  Intake 238 ml  Output 1550 ml  Net -1312 ml      General:  AAOx3 NAD HEENT: MMM [place] AT anicteric sclera Neck:  No JVD, no adenopathy CV:  Heart RRR  Lungs:  L/S CTA bilaterally Abd:  abd SNT/ND with normal BS GU:  Bladder non-palpable, positive Foley Extremities:  No LE edema. Skin:  No skin rash  MEDICATIONS:  . insulin aspart  0-9 Units Subcutaneous TID WC  . insulin aspart  5 Units Subcutaneous TID WC  . insulin detemir  30 Units Subcutaneous Daily  . pantoprazole  40 mg Oral Daily  . sodium chloride flush  3 mL Intravenous Q12H  . tamsulosin  0.4 mg Oral QPC supper       LABS:   CBC Latest Ref Rng & Units 07/02/2018 07/01/2018 06/30/2018  WBC 4.0 - 10.5 K/uL 23.5(H) 24.4(H) 26.7(H)  Hemoglobin 13.0 - 17.0 g/dL 12.0(L) 12.0(L) 11.7(L)  Hematocrit 39.0 - 52.0 % 35.4(L) 35.2(L) 32.3(L)  Platelets 150 - 400 K/uL 541(H) 530(H) 538(H)    CMP Latest Ref Rng & Units 07/02/2018 07/01/2018 06/30/2018  Glucose 70 - 99 mg/dL 167(H) 154(H) 315(H)  BUN 6 - 20 mg/dL 48(H) 50(H) 68(H)  Creatinine 0.61 - 1.24 mg/dL 3.62(H) 3.32(H) 4.01(H)  Sodium 135 - 145 mmol/L 135 137 132(L)  Potassium 3.5 - 5.1 mmol/L 3.9 3.3(L) 3.1(L)  Chloride 98 - 111 mmol/L 96(L) 98 99  CO2 22 - 32 mmol/L 28 27 21(L)  Calcium 8.9 - 10.3 mg/dL 8.8(L) 8.4(L) 8.0(L)  Total Protein 6.5 - 8.1 g/dL - - -  Total Bilirubin 0.3 - 1.2 mg/dL - - -  Alkaline Phos 38 - 126  U/L - - -  AST 15 - 41 U/L - - -  ALT 0 - 44 U/L - - -    Lab Results  Component Value Date   CALCIUM 8.8 (L) 07/02/2018   CAION 1.26 06/28/2018       Component Value Date/Time   COLORURINE YELLOW 06/28/2018 1626   APPEARANCEUR TURBID (A) 06/28/2018 1626   LABSPEC 1.013 06/28/2018 1626   PHURINE 6.0 06/28/2018 1626   GLUCOSEU >=500 (A) 06/28/2018 1626   HGBUR MODERATE (A) 06/28/2018 1626   BILIRUBINUR NEGATIVE 06/28/2018 1626   KETONESUR NEGATIVE 06/28/2018 1626   PROTEINUR 100 (A) 06/28/2018 1626   UROBILINOGEN 0.2 10/26/2013 0034   NITRITE NEGATIVE 06/28/2018 1626   LEUKOCYTESUR LARGE (A) 06/28/2018 1626      Component Value Date/Time   HCO3 11.8 (L) 06/28/2018 1558   TCO2 13 (L) 06/28/2018 1558   ACIDBASEDEF 16.0 (H) 06/28/2018 1558   O2SAT 62.0 06/28/2018 1558    No results found for: IRON, TIBC, FERRITIN, IRONPCTSAT     ASSESSMENT/PLAN:    58 year old male patient presents for DKA with persistent nausea, vomiting, and diarrhea for 5 days.  1.  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  I suspect his renal function has worsened since that time.  Likely on the basis of longstanding diabetes.  Urinalysis is significant for proteinuria.  Will check SPEP for completeness which is still pending.  Will need follow-up in the nephrology office.  2.  Acute kidney injury.  Likely on the basis of volume depletion in the setting of nausea, vomiting, and diarrhea.    Renal function is improving.  Suspect bladder outlet obstruction was significant cause as well.  Maintain Foley.  3.  Metabolic acidosis.    Resolved.  4.  Urinary tract infection.  Continue IV Rocephin.  Did have urinary retention.    Marked bilateral hydronephrosis was noted on renal ultrasound.  Maintain Foley catheter.  Needs urology evaluation for possible exophytic mass in the left kidney.  Urology has been following.  5.  Acute gastroenteritis.  Continue  treatment of electrolytes and repletion.    [place], DO, [place]

## 2018-07-02 NOTE — Progress Notes (Signed)
Admitted with DKA. Transition to home today. Pt to f/u with [facility], pt without PCP, no insurance, jobless, limited income. States can't afford Rx meds. Match Letter given. TOC petty cash assisted with copay  Hospital f/u scheduled with [facility],noted on AVS.

## 2018-07-02 NOTE — Progress Notes (Signed)
Patient is being discharge with foley cath, meds delivered by TOC. Foley care was taught to patient and brother in law, nail care was talked about and highly recommend that you cut your nails.

## 2018-07-02 NOTE — Discharge Instructions (Signed)
Acute Urinary Retention, Male  Acute urinary retention means that you cannot pee (urinate) at all, or that you pee too little and your bladder is not emptied completely. If it is not treated, it can lead to kidney damage or other serious problems. Follow these instructions at home:  Take over-the-counter and prescription medicines only as told by your doctor. Ask your doctor what medicines you should stay away from. Do not take any medicine unless your doctor says it is okay to do so.  If you were sent home with a tube that drains the bladder (catheter), take care of it as told by your doctor.  Drink enough fluid to keep your pee clear or pale yellow.  If you were given an antibiotic, take it as told by your doctor. Do not stop taking the antibiotic even if you start to feel better.  Do not use any products that contain nicotine or tobacco, such as cigarettes and e-cigarettes. If you need help quitting, ask your doctor.  Watch for changes in your symptoms. Tell your doctor about them.  If told, track changes in your blood pressure at home. Tell your doctor about them.  Keep all follow-up visits as told by your doctor. This is important. Contact a doctor if:  You have spasms or you leak pee when you have spasms. Get help right away if:  You have chills or a fever.  You have a tube that drains the bladder and: ? The tube stops draining pee. ? The tube falls out.  You have blood in your pee. Summary  Acute urinary retention means that you cannot pee at all, or that you pee too little and your bladder is not emptied completely. If it is not treated, it can result in kidney damage or other serious problems.  If you were sent home with a tube that drains the bladder, take care of it as told by your doctor.  Monitor any changes in your symptoms. Tell your doctor about any changes. This information is not intended to replace advice given to you by your health care provider. Make  sure you discuss any questions you have with your health care provider. Document Released: 06/15/2007 Document Revised: 01/29/2016 Document Reviewed: 01/29/2016 Elsevier Interactive Patient Education  2019 Elsevier Inc.   Acute Kidney Injury, Adult  Acute kidney injury is a sudden worsening of kidney function. The kidneys are organs that have several jobs. They filter the blood to remove waste products and extra fluid. They also maintain a healthy balance of minerals and hormones in the body, which helps control blood pressure and keep bones strong. With this condition, your kidneys do not do their jobs as well as they should. This condition ranges from mild to severe. Over time it may develop into long-lasting (chronic) kidney disease. Early detection and treatment may prevent acute kidney injury from developing into a chronic condition. What are the causes? Common causes of this condition include:  A problem with blood flow to the kidneys. This may be caused by: ? Low blood pressure (hypotension) or shock. ? Blood loss. ? Heart and blood vessel (cardiovascular) disease. ? Severe burns. ? Liver disease.  Direct damage to the kidneys. This may be caused by: ? Certain medicines. ? A kidney infection. ? Poisoning. ? Being around or in contact with toxic substances. ? A surgical wound. ? A hard, direct hit to the kidney area.  A sudden blockage of urine flow. This may be caused by: ? Cancer. ?  Kidney stones. ? An enlarged prostate in males. What are the signs or symptoms? Symptoms of this condition may not be obvious until the condition becomes severe. Symptoms of this condition can include:  Tiredness (lethargy), or difficulty staying awake.  Nausea or vomiting.  Swelling (edema) of the face, legs, ankles, or feet.  Problems with urination, such as: ? Abdominal pain, or pain along the side of your stomach (flank). ? Decreased urine production. ? Decrease in the force of  urine flow.  Muscle twitches and cramps, especially in the legs.  Confusion or trouble concentrating.  Loss of appetite.  Fever. How is this diagnosed? This condition may be diagnosed with tests, including:  Blood tests.  Urine tests.  Imaging tests.  A test in which a sample of tissue is removed from the kidneys to be examined under a microscope (kidney biopsy). How is this treated? Treatment for this condition depends on the cause and how severe the condition is. In mild cases, treatment may not be needed. The kidneys may heal on their own. In more severe cases, treatment will involve:  Treating the cause of the kidney injury. This may involve changing any medicines you are taking or adjusting your dosage.  Fluids. You may need specialized IV fluids to balance your body's needs.  Having a catheter placed to drain urine and prevent blockages.  Preventing problems from occurring. This may mean avoiding certain medicines or procedures that can cause further injury to the kidneys. In some cases treatment may also require:  A procedure to remove toxic wastes from the body (dialysis or continuous renal replacement therapy - CRRT).  Surgery. This may be done to repair a torn kidney, or to remove the blockage from the urinary system. Follow these instructions at home: Medicines  Take over-the-counter and prescription medicines only as told by your health care provider.  Do not take any new medicines without your health care provider's approval. Many medicines can worsen your kidney damage.  Do not take any vitamin and mineral supplements without your health care provider's approval. Many nutritional supplements can worsen your kidney damage. Lifestyle  If your health care provider prescribed changes to your diet, follow them. You may need to decrease the amount of protein you eat.  Achieve and maintain a healthy weight. If you need help with this, ask your health care  provider.  Start or continue an exercise plan. Try to exercise at least 30 minutes a day, 5 days a week.  Do not use any tobacco products, such as cigarettes, chewing tobacco, and e-cigarettes. If you need help quitting, ask your health care provider. General instructions  Keep track of your blood pressure. Report changes in your blood pressure as told by your health care provider.  Stay up to date with immunizations. Ask your health care provider which immunizations you need.  Keep all follow-up visits as told by your health care provider. This is important. Where to find more information  American Association of Kidney Patients: BombTimer.gl  National Kidney Foundation: www.kidney.[place]: https://mathis.com/  Life Options Rehabilitation Program: ? www.lifeoptions.org ? www.kidneyschool.org Contact a health care provider if:  Your symptoms get worse.  You develop new symptoms. Get help right away if:  You develop symptoms of worsening kidney disease, which include: ? Headaches. ? Abnormally dark or light skin. ? Easy bruising. ? Frequent hiccups. ? Chest pain. ? Shortness of breath. ? End of menstruation in women. ? Seizures. ? Confusion or altered mental status. ?  Abdominal or back pain. ? Itchiness.  You have a fever.  Your body is producing less urine.  You have pain or bleeding when you urinate. Summary  Acute kidney injury is a sudden worsening of kidney function.  Acute kidney injury can be caused by problems with blood flow to the kidneys, direct damage to the kidneys, and sudden blockage of urine flow.  Symptoms of this condition may not be obvious until it becomes severe. Symptoms may include edema, lethargy, confusion, nausea or vomiting, and problems passing urine.  This condition can usually be diagnosed with blood tests, urine tests, and imaging tests. Sometimes a kidney biopsy is done to diagnose this condition.  Treatment for  this condition often involves treating the underlying cause. It is treated with fluids, medicines, dialysis, diet changes, or surgery. This information is not intended to replace advice given to you by your health care provider. Make sure you discuss any questions you have with your health care provider. Document Released: 07/12/2010 Document Revised: 04/28/2016 Document Reviewed: 12/18/2015 Elsevier Interactive Patient Education  2019 [place].   Acute Urinary Retention, Male  Acute urinary retention means that you cannot pee (urinate) at all, or that you pee too little and your bladder is not emptied completely. If it is not treated, it can lead to kidney damage or other serious problems. Follow these instructions at home:  Take over-the-counter and prescription medicines only as told by your doctor. Ask your doctor what medicines you should stay away from. Do not take any medicine unless your doctor says it is okay to do so.  If you were sent home with a tube that drains the bladder (catheter), take care of it as told by your doctor.  Drink enough fluid to keep your pee clear or pale yellow.  If you were given an antibiotic, take it as told by your doctor. Do not stop taking the antibiotic even if you start to feel better.  Do not use any products that contain nicotine or tobacco, such as cigarettes and e-cigarettes. If you need help quitting, ask your doctor.  Watch for changes in your symptoms. Tell your doctor about them.  If told, track changes in your blood pressure at home. Tell your doctor about them.  Keep all follow-up visits as told by your doctor. This is important. Contact a doctor if:  You have spasms or you leak pee when you have spasms. Get help right away if:  You have chills or a fever.  You have a tube that drains the bladder and: ? The tube stops draining pee. ? The tube falls out.  You have blood in your pee. Summary  Acute urinary retention means  that you cannot pee at all, or that you pee too little and your bladder is not emptied completely. If it is not treated, it can result in kidney damage or other serious problems.  If you were sent home with a tube that drains the bladder, take care of it as told by your doctor.  Monitor any changes in your symptoms. Tell your doctor about any changes. This information is not intended to replace advice given to you by your health care provider. Make sure you discuss any questions you have with your health care provider. Document Released: 06/15/2007 Document Revised: 01/29/2016 Document Reviewed: 01/29/2016 Elsevier Interactive Patient Education  2019 [place].   Acute Urinary Retention, Male  Acute urinary retention means that you cannot pee (urinate) at all, or that you pee  too little and your bladder is not emptied completely. If it is not treated, it can lead to kidney damage or other serious problems. Follow these instructions at home:  Take over-the-counter and prescription medicines only as told by your doctor. Ask your doctor what medicines you should stay away from. Do not take any medicine unless your doctor says it is okay to do so.  If you were sent home with a tube that drains the bladder (catheter), take care of it as told by your doctor.  Drink enough fluid to keep your pee clear or pale yellow.  If you were given an antibiotic, take it as told by your doctor. Do not stop taking the antibiotic even if you start to feel better.  Do not use any products that contain nicotine or tobacco, such as cigarettes and e-cigarettes. If you need help quitting, ask your doctor.  Watch for changes in your symptoms. Tell your doctor about them.  If told, track changes in your blood pressure at home. Tell your doctor about them.  Keep all follow-up visits as told by your doctor. This is important. Contact a doctor if:  You have spasms or you leak pee when you have spasms. Get help  right away if:  You have chills or a fever.  You have a tube that drains the bladder and: ? The tube stops draining pee. ? The tube falls out.  You have blood in your pee. Summary  Acute urinary retention means that you cannot pee at all, or that you pee too little and your bladder is not emptied completely. If it is not treated, it can result in kidney damage or other serious problems.  If you were sent home with a tube that drains the bladder, take care of it as told by your doctor.  Monitor any changes in your symptoms. Tell your doctor about any changes. This information is not intended to replace advice given to you by your health care provider. Make sure you discuss any questions you have with your health care provider. Document Released: 06/15/2007 Document Revised: 01/29/2016 Document Reviewed: 01/29/2016 Elsevier Interactive Patient Education  2019 Elsevier Inc. Glucose Products:  ReliOn glucose products raise low blood sugar fast. Tablets are free of fat, caffeine, sodium and gluten. They are portable and easy to carry, making it easier for people with diabetes to BE PREPARED for lows.  Glucose Tablets Available in 6 flavors  10 ct...................................... $1.00  50 ct...................................... $3.98 Glucose Shot..................................$1.48 Glucose Gel....................................$3.44  Alcohol Swabs Alcohol swabs are used to sterilize your injection site. All of our swabs are individually wrapped for maximum safety, convenience and moisture retention. ReliOn Alcohol Swabs  100 ct Swabs..............................$1.00  400 ct Swabs..............................$3.74  Lancets ReliOn offers three lancet options conveniently designed to work with almost every lancing device. Each features a protective disk, which guarantees sterility before testing. ReliOn Lancets  100 ct Lancets $1.56  200 ct Lancets  $2.64 Available in Ultra-Thin, Thin & Micro-Thin ReliOn 2-IN-1 Lancing Device  50 ct Lancets..................................... $3.44 Available in 30 gauge and 25 gauge ReliOn Lancing Device....................$5.84  Blood Glucose Monitors ReliOn offers a full range of blood glucose testing options to provide an accurate, affordable system that meets each person's unique needs and preferences. Prime Meter....................................... $9.00 Prime Test Strips  25 test strips.................................... $5.00  50 test strips.................................... $9.00  100 test strips.................................$17.88 Premier [employer] Meter    $18.98 Premier Voice Meter  .  $14.98 Premier Test Strips  50 test strips.................................... $9.00  100 test strips.................................$17.88 Premier [employer] Kit    $19.44 Kit includes:  50 test strips  10 lancets  Lancing device  Carry case  Ketone Test Strips  50 test strips  ..  $6.64  Human Insulin  Novolin/ReliOn (recombinant DNA origin) is manufactured for [employer] by [employer] Insulin* with Vial..........$24.88 Available in N, R, 70/30 Novolin/ReliOn Insulin Pens*    $42.88 Available in 70/30, N, R  Insulin Delivery ReliOn syringes and pen needles provide precision technology, comfort and accuracy in insulin delivery at affordable prices. ReliOn Pen Needles*  50 ct....................................................$9.00 Available in 33m, 657m 34m23m 57m334mliOn Insulin Syringes*  100 ct . $12.58 Available in 29G, 30G & 31G (3/10cc, 1/2cc & 1cc units)

## 2018-07-03 ENCOUNTER — Other Ambulatory Visit: Payer: Self-pay

## 2018-07-03 ENCOUNTER — Emergency Department (HOSPITAL_COMMUNITY)
Admission: EM | Admit: 2018-07-03 | Discharge: 2018-07-03 | Disposition: A | Discharge status: Home / Self Care | Payer: Self-pay | Attending: Emergency Medicine | Admitting: Emergency Medicine

## 2018-07-03 DIAGNOSIS — Y828 Other medical devices associated with adverse incidents: Secondary | ICD-10-CM | POA: Insufficient documentation

## 2018-07-03 DIAGNOSIS — I10 Essential (primary) hypertension: Secondary | ICD-10-CM | POA: Insufficient documentation

## 2018-07-03 DIAGNOSIS — F17210 Nicotine dependence, cigarettes, uncomplicated: Secondary | ICD-10-CM | POA: Insufficient documentation

## 2018-07-03 DIAGNOSIS — R739 Hyperglycemia, unspecified: Secondary | ICD-10-CM

## 2018-07-03 DIAGNOSIS — E1165 Type 2 diabetes mellitus with hyperglycemia: Secondary | ICD-10-CM | POA: Insufficient documentation

## 2018-07-03 DIAGNOSIS — T83031A Leakage of indwelling urethral catheter, initial encounter: Secondary | ICD-10-CM | POA: Insufficient documentation

## 2018-07-03 DIAGNOSIS — Z79899 Other long term (current) drug therapy: Secondary | ICD-10-CM | POA: Insufficient documentation

## 2018-07-03 DIAGNOSIS — T839XXA Unspecified complication of genitourinary prosthetic device, implant and graft, initial encounter: Secondary | ICD-10-CM

## 2018-07-03 LAB — CBC WITH DIFFERENTIAL/PLATELET
Abs Immature Granulocytes: 0.29 10*3/uL — ABNORMAL HIGH (ref 0.00–0.07)
Basophils Absolute: 0.2 10*3/uL — ABNORMAL HIGH (ref 0.0–0.1)
Basophils Relative: 1 %
Eosinophils Absolute: 0.3 10*3/uL (ref 0.0–0.5)
Eosinophils Relative: 2 %
HCT: 38.4 % — ABNORMAL LOW (ref 39.0–52.0)
Hemoglobin: 12.1 g/dL — ABNORMAL LOW (ref 13.0–17.0)
Immature Granulocytes: 2 %
Lymphocytes Relative: 11 %
Lymphs Abs: 2.1 10*3/uL (ref 0.7–4.0)
MCH: 27.8 pg (ref 26.0–34.0)
MCHC: 31.5 g/dL (ref 30.0–36.0)
MCV: 88.3 fL (ref 80.0–100.0)
Monocytes Absolute: 1.1 10*3/uL — ABNORMAL HIGH (ref 0.1–1.0)
Monocytes Relative: 6 %
Neutro Abs: 14.5 10*3/uL — ABNORMAL HIGH (ref 1.7–7.7)
Neutrophils Relative %: 78 %
Platelets: 442 10*3/uL — ABNORMAL HIGH (ref 150–400)
RBC: 4.35 MIL/uL (ref 4.22–5.81)
RDW: 13 % (ref 11.5–15.5)
WBC: 18.5 10*3/uL — ABNORMAL HIGH (ref 4.0–10.5)
nRBC: 0 % (ref 0.0–0.2)

## 2018-07-03 LAB — BASIC METABOLIC PANEL
Anion gap: 13 (ref 5–15)
BUN: 44 mg/dL — ABNORMAL HIGH (ref 6–20)
CO2: 25 mmol/L (ref 22–32)
Calcium: 8.4 mg/dL — ABNORMAL LOW (ref 8.9–10.3)
Chloride: 97 mmol/L — ABNORMAL LOW (ref 98–111)
Creatinine, Ser: 3.69 mg/dL — ABNORMAL HIGH (ref 0.61–1.24)
GFR calc Af Amer: 20 mL/min — ABNORMAL LOW (ref >60)
GFR calc non Af Amer: 17 mL/min — ABNORMAL LOW (ref >60)
Glucose, Bld: 271 mg/dL — ABNORMAL HIGH (ref 70–99)
Potassium: 4.4 mmol/L (ref 3.5–5.1)
Sodium: 135 mmol/L (ref 135–145)

## 2018-07-03 LAB — PROTEIN ELECTROPHORESIS, SERUM
A/G Ratio: 0.6 — ABNORMAL LOW (ref 0.7–1.7)
Albumin ELP: 2.5 g/dL — ABNORMAL LOW (ref 2.9–4.4)
Alpha-1-Globulin: 0.4 g/dL (ref 0.0–0.4)
Alpha-2-Globulin: 1.1 g/dL — ABNORMAL HIGH (ref 0.4–1.0)
Beta Globulin: 0.8 g/dL (ref 0.7–1.3)
Gamma Globulin: 1.7 g/dL (ref 0.4–1.8)
Globulin, Total: 4 g/dL — ABNORMAL HIGH (ref 2.2–3.9)
Total Protein ELP: 6.5 g/dL (ref 6.0–8.5)

## 2018-07-03 LAB — CBG MONITORING, ED: Glucose-Capillary: 267 mg/dL — ABNORMAL HIGH (ref 70–99)

## 2018-07-03 NOTE — ED Notes (Signed)
Pt provided with a [place] sandwich, cheese, applesauce and a drink.  Pt was provided with new shorts and a new shirt due to clothing being soiled on arrival.

## 2018-07-03 NOTE — ED Notes (Signed)
Bed: WA21 Expected date:  Expected time:  Means of arrival:  Comments: EMS sluggish, colostomy bag issue

## 2018-07-03 NOTE — ED Triage Notes (Signed)
Pt BIB [facility] picked up from [employer]. Pt states that his bag is busted to EMS, EMS reported that his colostomy bag busted yesterday, upon inspection pt only has a foley with a leg bag. Leg bag was unscrewed and was leaking. Leg bag was secured and there seems to be no other leaks.   VSS CBG 267

## 2018-07-03 NOTE — ED Provider Notes (Signed)
[facility] DEPT Provider Note   CSN: [id number] Arrival date & time: 07/03/18  1925    History   Chief Complaint Chief Complaint  Patient presents with  . Catheter Issue    HPI Timothy Madden is a 58 y.o. male.     HPI  58 year old male presents with nausea and a Foley catheter problem.  He was discharged from the hospital yesterday for renal failure and DKA.  He states that he started feeling nauseated tonight.  He also noticed where his Foley catheter bag was leaking.  Shortly after arrival the nurse tightened the valve and it seems to be doing fine.  Otherwise no vomiting, fever or pain.  Past Medical History:  Diagnosis Date  . Amputation of finger, left   . Complication of anesthesia   . Diabetes mellitus without complication ([place])   . DKA (diabetic ketoacidoses) ([place]) 06/2018  . PONV (postoperative nausea and vomiting)     Patient Active Problem List   Diagnosis Date Noted  . DKA, type 2 ([place]) 06/28/2018  . Acute renal failure superimposed on chronic kidney disease ([place]) 06/28/2018  . Elevated lipase 06/28/2018  . 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 Osteomyelitis ([place]) 10/26/2013  . Sepsis ([place]) 10/26/2013  . AKI (acute kidney injury) ([place]) 10/26/2013  . Hyponatremia 10/26/2013  . Type I diabetes mellitus with complication, uncontrolled ([place]) 10/26/2013  . Essential hypertension 10/26/2013  . Anemia of chronic disease 10/26/2013  . PSYCHIATRIC DISORDER 07/19/2006  . TOBACCO ABUSE 07/19/2006  . DIABETES MELLITUS, TYPE II 11/30/2005  . ANXIETY 11/30/2005    Past Surgical History:  Procedure Laterality Date  . I&D EXTREMITY Left 10/26/2013   Procedure: IRRIGATION AND DEBRIDEMENT EXTREMITY,PARTIAL AMPUTATION LEFT INDEX FINGER;  Surgeon: Dayna Barker, MD;  Location: [facility];  Service: Plastics;  Laterality: Left;  . I&D EXTREMITY Left 10/30/2013   Procedure: IRRIGATION AND DEBRIDEMENT AND REVISION  AMPUTATION OF LEFT INDEX FINGER;  Surgeon: Dayna Barker, MD;  Location: [facility];  Service: Plastics;  Laterality: Left;        Home Medications    Prior to Admission medications   Medication Sig Start Date End Date Taking? Authorizing Provider  blood glucose meter kit and supplies KIT Dispense based on patient and insurance preference. Use up to four times daily as directed. (FOR ICD-9 250.00, 250.01). 07/02/18   Aline August, MD  cephALEXin (KEFLEX) 250 MG capsule Take 1 capsule (250 mg total) by mouth 2 (two) times daily for 3 days. 07/02/18 07/05/18  Aline August, MD  insulin aspart (NOVOLOG) 100 UNIT/ML injection Inject 5 Units into the skin 3 (three) times daily with meals. 07/02/18   Aline August, MD  insulin detemir (LEVEMIR) 100 UNIT/ML injection Inject 0.3 mLs (30 Units total) into the skin daily. 07/03/18   Aline August, MD  Insulin Syringe-Needle U-100 (INSULIN SYRINGE .5CC/31GX5/16") 31G X 5/16" 0.5 ML MISC Levemir 30 units daily and NovoLog 5 units 3 times a day with meals 07/02/18   Aline August, MD  ondansetron (ZOFRAN) 4 MG tablet Take 1 tablet (4 mg total) by mouth every 6 (six) hours as needed for nausea. 07/02/18   Aline August, MD  tamsulosin (FLOMAX) 0.4 MG CAPS capsule Take 1 capsule (0.4 mg total) by mouth daily after supper. 07/02/18   Aline August, MD    Family History Family History  Problem Relation Age of Onset  . Hypertension Mother   . Diabetes Father   . Diabetes Sister   .  Diabetes Brother   . Heart disease Brother     Social History Social History   Tobacco Use  . Smoking status: Current Every Day Smoker    Packs/day: 1.00    Years: 20.00    Pack years: 20.00    Types: Cigarettes  . Smokeless tobacco: Current User    Types: Chew  Substance Use Topics  . Alcohol use: No    Alcohol/week: 0.0 standard drinks  . Drug use: No     Allergies   Patient has no known allergies.   Review of Systems Review of Systems  Constitutional:  Negative for fever.  Gastrointestinal: Positive for nausea. Negative for abdominal pain and vomiting.  All other systems reviewed and are negative.    Physical Exam Updated Vital Signs BP 108/82   Pulse 94   Temp 98 F (36.7 C)   Resp 16   SpO2 100%   Physical Exam Vitals signs and nursing note reviewed.  Constitutional:      General: He is not in acute distress.    Appearance: He is well-developed. He is not ill-appearing or diaphoretic.  HENT:     Head: Normocephalic and atraumatic.     Right Ear: External ear normal.     Left Ear: External ear normal.     Nose: Nose normal.  Eyes:     General:        Right eye: No discharge.        Left eye: No discharge.  Neck:     Musculoskeletal: Neck supple.  Cardiovascular:     Rate and Rhythm: Normal rate and regular rhythm.     Heart sounds: Normal heart sounds.  Pulmonary:     Effort: Pulmonary effort is normal.     Breath sounds: Normal breath sounds.  Abdominal:     General: There is no distension.     Palpations: Abdomen is soft.     Tenderness: There is no abdominal tenderness.  Genitourinary:    Comments: Foley catheter is in place with leg bag attached. No leaking noted Skin:    General: Skin is warm and dry.  Neurological:     Mental Status: He is alert.  Psychiatric:        Mood and Affect: Mood is not anxious.      ED Treatments / Results  Labs (all labs ordered are listed, but only abnormal results are displayed) Labs Reviewed  BASIC METABOLIC PANEL - Abnormal; Notable for the following components:      Result Value   Chloride 97 (*)    Glucose, Bld 271 (*)    BUN 44 (*)    Creatinine, Ser 3.69 (*)    Calcium 8.4 (*)    GFR calc non Af Amer 17 (*)    GFR calc Af Amer 20 (*)    All other components within normal limits  CBC WITH DIFFERENTIAL/PLATELET - Abnormal; Notable for the following components:   WBC 18.5 (*)    Hemoglobin 12.1 (*)    HCT 38.4 (*)    Platelets 442 (*)    Neutro Abs 14.5  (*)    Monocytes Absolute 1.1 (*)    Basophils Absolute 0.2 (*)    Abs Immature Granulocytes 0.29 (*)    All other components within normal limits  CBG MONITORING, ED - Abnormal; Notable for the following components:   Glucose-Capillary 267 (*)    All other components within normal limits    EKG None  Radiology No results  found.  Procedures Procedures (including critical care time)  Medications Ordered in ED Medications - No data to display   Initial Impression / Assessment and Plan / ED Course  I have reviewed the triage vital signs and the nursing notes.  Pertinent labs & imaging results that were available during my care of the patient were reviewed by me and considered in my medical decision making (see chart for details).        Patient's "catheter problem" was that the bottom was leaking and not fully tightened.  This was corrected by the nurse.  He is moderately hyperglycemic but his labs are overall reassuring compared to yesterday.  WBC still elevated but improved.  Creatinine stable over the last few days.  Otherwise vitals are stable and no other complaints.  Appears stable for discharge home.  Final Clinical Impressions(s) / ED Diagnoses   Final diagnoses:  Foley catheter problem, initial encounter [facility])  Hyperglycemia    ED Discharge Orders    None       Sherwood Gambler, MD 07/03/18 2134

## 2018-07-06 ENCOUNTER — Other Ambulatory Visit: Payer: Self-pay

## 2018-07-06 ENCOUNTER — Ambulatory Visit: Payer: Self-pay | Attending: Primary Care | Admitting: Primary Care

## 2018-07-18 ENCOUNTER — Emergency Department (HOSPITAL_COMMUNITY): Payer: Self-pay

## 2018-07-18 ENCOUNTER — Emergency Department (HOSPITAL_COMMUNITY)
Admission: EM | Admit: 2018-07-18 | Discharge: 2018-07-19 | Disposition: A | Discharge status: Home / Self Care | Payer: Self-pay | Attending: Emergency Medicine | Admitting: Emergency Medicine

## 2018-07-18 ENCOUNTER — Other Ambulatory Visit: Payer: Self-pay

## 2018-07-18 DIAGNOSIS — F17210 Nicotine dependence, cigarettes, uncomplicated: Secondary | ICD-10-CM | POA: Insufficient documentation

## 2018-07-18 DIAGNOSIS — F209 Schizophrenia, unspecified: Secondary | ICD-10-CM | POA: Insufficient documentation

## 2018-07-18 DIAGNOSIS — E119 Type 2 diabetes mellitus without complications: Secondary | ICD-10-CM | POA: Insufficient documentation

## 2018-07-18 DIAGNOSIS — N3000 Acute cystitis without hematuria: Secondary | ICD-10-CM | POA: Insufficient documentation

## 2018-07-18 DIAGNOSIS — N320 Bladder-neck obstruction: Secondary | ICD-10-CM

## 2018-07-18 DIAGNOSIS — N329 Bladder disorder, unspecified: Secondary | ICD-10-CM | POA: Insufficient documentation

## 2018-07-18 DIAGNOSIS — Z794 Long term (current) use of insulin: Secondary | ICD-10-CM | POA: Insufficient documentation

## 2018-07-18 DIAGNOSIS — Z79899 Other long term (current) drug therapy: Secondary | ICD-10-CM | POA: Insufficient documentation

## 2018-07-18 DIAGNOSIS — Z20828 Contact with and (suspected) exposure to other viral communicable diseases: Secondary | ICD-10-CM | POA: Insufficient documentation

## 2018-07-18 NOTE — ED Triage Notes (Signed)
Pt arrives to the ED with c/o of wounds to bilateral legs he states is from not being able to take care of himself and no one able to help him. Pt states he lives with his mom and brother but no one gives him food or helps him change his clothes or bathe.

## 2018-07-18 NOTE — ED Provider Notes (Signed)
TIME SEEN: 11:31 PM  CHIEF COMPLAINT: Multiple complaints  HPI: Patient is a 58 year old male with history of diabetes, schizophrenia who presents to the emergency department with multiple complaints.  He states he is here because he needs his Foley catheter removed.  It appears it was placed during an admission 06/28/2018 after he was found to have acute renal failure likely secondary to uncontrolled diabetes and outlet obstruction.  Renal ultrasound showed severe bilateral hydronephrosis.  Urology was consulted and recommended repeat ultrasound and follow-up in 1 to 2 weeks for voiding trial.  Patient never followed up with urology.  He states he would like the Foley catheter removed today.  He also reports that he lives with his mother and states that they withhold hygiene items from him.  States he needs soap, Listerine.   Patient also thinks that he would benefit from seeing a psychiatrist.  He denies SI, HI or hallucinations.  He reports history of schizophrenia.  He does have flight of ideas, rapid pressured speech here in the emergency department.  Patient is difficult to get a clear history from secondary to this.  He begins talking about his Foley catheter and then quickly moves into needing soap and Listerine and how these things can be obtained from [employer] and then tells me that he has a little dog at home and then states "if I were to beat my wife, you would be mad at me.  If I were to hurt my dog, you would be mad at me".   ROS: Level 5 caveat secondary to psychiatric condition  PAST MEDICAL HISTORY/PAST SURGICAL HISTORY:  Past Medical History:  Diagnosis Date  . Amputation of finger, left   . Complication of anesthesia   . Diabetes mellitus without complication ([place])   . DKA (diabetic ketoacidoses) ([place]) 06/2018  . PONV (postoperative nausea and vomiting)     MEDICATIONS:  Prior to Admission medications   Medication Sig Start Date End Date Taking? Authorizing  Provider  blood glucose meter kit and supplies KIT Dispense based on patient and insurance preference. Use up to four times daily as directed. (FOR ICD-9 250.00, 250.01). 07/02/18   Aline August, MD  insulin aspart (NOVOLOG) 100 UNIT/ML injection Inject 5 Units into the skin 3 (three) times daily with meals. 07/02/18   Aline August, MD  insulin detemir (LEVEMIR) 100 UNIT/ML injection Inject 0.3 mLs (30 Units total) into the skin daily. 07/03/18   Aline August, MD  Insulin Syringe-Needle U-100 (INSULIN SYRINGE .5CC/31GX5/16") 31G X 5/16" 0.5 ML MISC Levemir 30 units daily and NovoLog 5 units 3 times a day with meals 07/02/18   Aline August, MD  ondansetron (ZOFRAN) 4 MG tablet Take 1 tablet (4 mg total) by mouth every 6 (six) hours as needed for nausea. 07/02/18   Aline August, MD  tamsulosin (FLOMAX) 0.4 MG CAPS capsule Take 1 capsule (0.4 mg total) by mouth daily after supper. 07/02/18   Aline August, MD    ALLERGIES:  No Known Allergies  SOCIAL HISTORY:  Social History   Tobacco Use  . Smoking status: Current Every Day Smoker    Packs/day: 1.00    Years: 20.00    Pack years: 20.00    Types: Cigarettes  . Smokeless tobacco: Current User    Types: Chew  Substance Use Topics  . Alcohol use: No    Alcohol/week: 0.0 standard drinks    FAMILY HISTORY: Family History  Problem Relation Age of Onset  . Hypertension Mother   .  Diabetes Father   . Diabetes Sister   . Diabetes Brother   . Heart disease Brother     EXAM: BP (!) 141/90 (BP Location: Right Arm)   Pulse 85   Temp 98.8 F (37.1 C) (Oral)   Resp 18   SpO2 100%  CONSTITUTIONAL: Alert and oriented but is a very poor historian and has rapid, pressured speech and flight of ideas, he is chronically ill-appearing, afebrile HEAD: Normocephalic EYES: Conjunctivae clear, pupils appear equal, EOMI ENT: normal nose; moist mucous membranes NECK: Supple, no meningismus, no nuchal rigidity, no LAD  CARD: RRR; S1 and S2  appreciated; no murmurs, no clicks, no rubs, no gallops RESP: Normal chest excursion without splinting or tachypnea; breath sounds clear and equal bilaterally; no wheezes, no rhonchi, no rales, no hypoxia or respiratory distress, speaking full sentences ABD/GI: Normal bowel sounds; non-distended; soft, non-tender, no rebound, no guarding, no peritoneal signs, no hepatosplenomegaly GU: Foley catheter in place without drainage around the catheter, bleeding, discharge.  There is a large amount of sediment in the Foley catheter. BACK:  The back appears normal and is non-tender to palpation, there is no CVA tenderness EXT: Normal ROM in all joints; non-tender to palpation; no edema; normal capillary refill; no cyanosis, no calf tenderness or swelling    SKIN: Normal color for age and race; warm; no rash NEURO: Moves all extremities equally PSYCH: Flight of ideas.  Rapid pressured speech.  Denies SI, HI or hallucinations.  Poor hygiene.  MEDICAL DECISION MAKING: Patient here with multiple complaints.  Requesting that his Foley catheter be removed.  On review of his records, it appears urology wanted to see patient 1 to 2 weeks after discharge from the hospital for voiding trial.  He states he never set this appointment up and I worry that because of his psychiatric history he would not be willing to do this as an outpatient.  Urology also recommended that he have a repeat ultrasound while in the hospital to ensure improvement of his hydronephrosis.  It does not appear this ultrasound was completed.  Will obtain ultrasound today and remove Foley catheter and attempt a voiding trial here.   Patient also here stating that he needs hygiene items.  He states that he lives at home with his mother.  He does have poor hygiene on exam but no obvious signs of neglect.   Patient appears to be manic at this time with flight of ideas, tangential thought process, rapid and pressured speech.  He agrees that he needs to  see psychiatry and would likely benefit from psychiatric admission.  He is here voluntarily.  Will obtain screening labs, urine.  Will consult TTS once medically cleared.  ED PROGRESS: Patient does have 1 oxygen saturation of 82% documented by previous nurse.  This is not accurate.  If patient did not have his pulse oximetry on correctly.  He is not hypoxic.  He has no oxygen requirement.   12:35 AM  Pt's urine shows large leukocyte esterase, 21-50 red blood cells, greater than 50 white blood cells and few bacteria.  Budding yeast also present.  Urine culture is pending.  Last urine culture grew staph epidermidis with pan sensitivity.  Will give IV Rocephin and Diflucan.  Renal ultrasound shows significant improvement of bilateral hydronephrosis.  He still has persistent bladder wall thickening of unknown clinical significance and urologic follow-up is recommended.  Foley catheter has been removed.  Clear fluid challenging patient to see if he is able to void  on his own.   Patient's labs show a creatinine of 2.72.  While this is elevated from his baseline it is still markedly improved from his recent admission.  Otherwise his labs are unremarkable.  At this time I feel he does not meet criteria for medical admission and I will consult TTS.   3:25 AM  Patient is still unable to urinate on his own in the ED.  Will bladder scan.  Beverely Low has seen patient and recommends psychiatry to see in AM.  Will place social work consult.  3:50 AM  Pt able to urinate approximately 100 mL on his own but then was bladder scan and still has 450 mL in his bladder.  Given his history of outlet obstruction with renal failure and retention today, will replace his Foley catheter.  When social work see the patient in the morning I feel they will need to help the patient with outpatient needs including outpatient urology follow-up.  7:00 AM  Signed out to oncoming EDP.  Awaiting social work and psychiatric reevaluation this  morning.  If patient is discharged, he will need PCP and urology follow-up.  I have printed a prescription of Keflex for his UTI.  He has a new Foley catheter in place.  He has been resting comfortably.  He has been able to eat and drink without difficulty.   I reviewed all nursing notes, vitals, pertinent previous records, EKGs, lab and urine results, imaging (as available).    Ioanna Colquhoun, Delice Bison, DO 07/19/18 [phone]

## 2018-07-19 ENCOUNTER — Emergency Department (HOSPITAL_COMMUNITY): Payer: Self-pay

## 2018-07-19 DIAGNOSIS — F209 Schizophrenia, unspecified: Secondary | ICD-10-CM

## 2018-07-19 DIAGNOSIS — R4189 Other symptoms and signs involving cognitive functions and awareness: Secondary | ICD-10-CM

## 2018-07-19 DIAGNOSIS — R4789 Other speech disturbances: Secondary | ICD-10-CM

## 2018-07-19 LAB — COMPREHENSIVE METABOLIC PANEL
ALT: 13 U/L (ref 0–44)
AST: 13 U/L — ABNORMAL LOW (ref 15–41)
Albumin: 3 g/dL — ABNORMAL LOW (ref 3.5–5.0)
Alkaline Phosphatase: 112 U/L (ref 38–126)
Anion gap: 11 (ref 5–15)
BUN: 27 mg/dL — ABNORMAL HIGH (ref 6–20)
CO2: 24 mmol/L (ref 22–32)
Calcium: 9.5 mg/dL (ref 8.9–10.3)
Chloride: 100 mmol/L (ref 98–111)
Creatinine, Ser: 2.72 mg/dL — ABNORMAL HIGH (ref 0.61–1.24)
GFR calc Af Amer: 29 mL/min — ABNORMAL LOW (ref >60)
GFR calc non Af Amer: 25 mL/min — ABNORMAL LOW (ref >60)
Glucose, Bld: 286 mg/dL — ABNORMAL HIGH (ref 70–99)
Potassium: 4.7 mmol/L (ref 3.5–5.1)
Sodium: 135 mmol/L (ref 135–145)
Total Bilirubin: 0.4 mg/dL (ref 0.3–1.2)
Total Protein: 8.8 g/dL — ABNORMAL HIGH (ref 6.5–8.1)

## 2018-07-19 LAB — CBC WITH DIFFERENTIAL/PLATELET
Abs Immature Granulocytes: 0.12 10*3/uL — ABNORMAL HIGH (ref 0.00–0.07)
Basophils Absolute: 0.1 10*3/uL (ref 0.0–0.1)
Basophils Relative: 1 %
Eosinophils Absolute: 0.2 10*3/uL (ref 0.0–0.5)
Eosinophils Relative: 2 %
HCT: 38.8 % — ABNORMAL LOW (ref 39.0–52.0)
Hemoglobin: 12 g/dL — ABNORMAL LOW (ref 13.0–17.0)
Immature Granulocytes: 1 %
Lymphocytes Relative: 23 %
Lymphs Abs: 2.5 10*3/uL (ref 0.7–4.0)
MCH: 27 pg (ref 26.0–34.0)
MCHC: 30.9 g/dL (ref 30.0–36.0)
MCV: 87.2 fL (ref 80.0–100.0)
Monocytes Absolute: 0.9 10*3/uL (ref 0.1–1.0)
Monocytes Relative: 9 %
Neutro Abs: 6.9 10*3/uL (ref 1.7–7.7)
Neutrophils Relative %: 64 %
Platelets: 421 10*3/uL — ABNORMAL HIGH (ref 150–400)
RBC: 4.45 MIL/uL (ref 4.22–5.81)
RDW: 12.9 % (ref 11.5–15.5)
WBC: 10.7 10*3/uL — ABNORMAL HIGH (ref 4.0–10.5)
nRBC: 0 % (ref 0.0–0.2)

## 2018-07-19 LAB — URINE CULTURE

## 2018-07-19 LAB — RAPID URINE DRUG SCREEN, HOSP PERFORMED
Amphetamines: NOT DETECTED
Barbiturates: NOT DETECTED
Benzodiazepines: NOT DETECTED
Cocaine: NOT DETECTED
Opiates: NOT DETECTED
Tetrahydrocannabinol: NOT DETECTED

## 2018-07-19 LAB — URINALYSIS, ROUTINE W REFLEX MICROSCOPIC
Bilirubin Urine: NEGATIVE
Glucose, UA: >=500 mg/dL — AB
Ketones, ur: NEGATIVE mg/dL
Nitrite: NEGATIVE
Protein, ur: 100 mg/dL — AB
Specific Gravity, Urine: 1.015 (ref 1.005–1.030)
WBC, UA: >50 WBC/hpf — ABNORMAL HIGH (ref 0–5)
pH: 6 (ref 5.0–8.0)

## 2018-07-19 LAB — CBG MONITORING, ED: Glucose-Capillary: 358 mg/dL — ABNORMAL HIGH (ref 70–99)

## 2018-07-19 LAB — ETHANOL: Alcohol, Ethyl (B): <10 mg/dL (ref <10)

## 2018-07-19 LAB — SARS CORONAVIRUS 2 BY RT PCR (HOSPITAL ORDER, PERFORMED IN ~~LOC~~ HOSPITAL LAB): SARS Coronavirus 2: NEGATIVE

## 2018-07-19 LAB — SALICYLATE LEVEL: Salicylate Lvl: <7 mg/dL (ref 2.8–30.0)

## 2018-07-19 LAB — ACETAMINOPHEN LEVEL: Acetaminophen (Tylenol), Serum: <10 ug/mL — ABNORMAL LOW (ref 10–30)

## 2018-07-19 MED ORDER — SODIUM CHLORIDE 0.9 % IV SOLN
1.0000 g | Freq: Once | INTRAVENOUS | Status: AC
  Administered 2018-07-19: 02:00:00 1 g via INTRAVENOUS
  Filled 2018-07-19: qty 10

## 2018-07-19 MED ORDER — INSULIN DETEMIR 100 UNIT/ML ~~LOC~~ SOLN
30.0000 [IU] | Freq: Every day | SUBCUTANEOUS | Status: DC
  Administered 2018-07-19: 30 [IU] via SUBCUTANEOUS
  Filled 2018-07-19: qty 0.3

## 2018-07-19 MED ORDER — FLUCONAZOLE 150 MG PO TABS
150.0000 mg | ORAL_TABLET | Freq: Once | ORAL | Status: AC
  Administered 2018-07-19: 02:00:00 150 mg via ORAL
  Filled 2018-07-19: qty 1

## 2018-07-19 MED ORDER — INSULIN ASPART 100 UNIT/ML ~~LOC~~ SOLN
5.0000 [IU] | Freq: Three times a day (TID) | SUBCUTANEOUS | Status: DC
  Administered 2018-07-19: 08:00:00 5 [IU] via SUBCUTANEOUS

## 2018-07-19 MED ORDER — CEPHALEXIN 500 MG PO CAPS: 500.0000 mg | ORAL_CAPSULE | Freq: Three times a day (TID) | ORAL | 0 refills | Status: DC

## 2018-07-19 MED ORDER — CEPHALEXIN 250 MG PO CAPS
500.0000 mg | ORAL_CAPSULE | Freq: Three times a day (TID) | ORAL | Status: DC
  Administered 2018-07-19: 500 mg via ORAL
  Filled 2018-07-19: qty 2

## 2018-07-19 MED ORDER — TAMSULOSIN HCL 0.4 MG PO CAPS: 0.4000 mg | ORAL_CAPSULE | Freq: Every day | ORAL | Status: DC

## 2018-07-19 NOTE — Consult Note (Signed)
Telepsych Consultation   Reason for Consult:  Schizophrenia presenting with flight of ideas, rapid pressured speech Referring Physician:  EDP  Location of Patient: [facility] ED [place] Location of Provider: [facility]  Patient Identification: Timothy Madden MRN:  [id number] Principal Diagnosis: <principal problem not specified> Diagnosis:  Active Problems:   * No active hospital problems. *   Total Time spent with patient: 20 minutes  Subjective:     HPI:  Timothy Madden is a 58 y.o. male patient requiring psychiatric consultation per EDP. Patient has a history of schizophrenia and uncontrolled diabetes per review of chart. He lives with his mother and when asked his reason for being taken to the hospital he could not give a clear answer. He did state he is not getting the care he needs at home. He reports his mother is withholding personal hygiene products from him. He states that he was once receiving home health care (through [facility]) to assist with his daily routine although he is no longer receiving the services. He reports he would like for someone to help him apply for disability. He denies negative symptoms associated with schizophrenia including AVH, delusions, or paranoid thoughts. He denies active or passive suicidal ideations or homicidal thoughts. Per chart review, it was initially documented that patient was responding to internal stimuli however,there are no signs of this during this evaluation.  His thought process is however circumstantial and he does have some flight of ideas/ He has no prior psychiatric hospitalizations that are noted. He is currently on antipsychotic medications as per his report although he denies seeing a psychiatrists. He does state he goes to the [facility] although he is unable to recall his last appointment. He denies substance abuse or use. Denies history of SA or self harm. There has been no reports of agitated or aggressive  behaviors.   I did ask permission to obtain collateral from his mother and he provided a number, [phone] which was also the number indicated int he chart. I called the number multiple times and finally someone picked up and stated it was the wrong number. An alternative number was noted in the chart although that number was the wrong number as well.          Past Psychiatric History: Schizophrenia as per patient report   Risk to Self: Suicidal Ideation: No Suicidal Intent: No Is patient at risk for suicide?: No Suicidal Plan?: No Access to Means: No What has been your use of drugs/alcohol within the last 12 months?: Denies How many times?: 0 Other Self Harm Risks: None Triggers for Past Attempts: None known Intentional Self Injurious Behavior: None Risk to Others: Homicidal Ideation: No Thoughts of Harm to Others: No Current Homicidal Intent: No Current Homicidal Plan: No Access to Homicidal Means: No Identified Victim: No one History of harm to others?: No Assessment of Violence: None Noted Violent Behavior Description: None noted Does patient have access to weapons?: No Criminal Charges Pending?: No Does patient have a court date: No Prior Inpatient Therapy: Prior Inpatient Therapy: Yes Prior Therapy Dates: 12 years ago Prior Therapy Facilty/Provider(s): [facility] Reason for Treatment: psychosis Prior Outpatient Therapy: Prior Outpatient Therapy: No Does patient have an ACCT team?: No Does patient have Intensive In-House Services?  : No Does patient have [facility] services? : No Does patient have P4CC services?: No  Past Medical History:  Past Medical History:  Diagnosis Date  . Amputation of finger, left   . Complication of anesthesia   .  Diabetes mellitus without complication ([place])   . DKA (diabetic ketoacidoses) ([place]) 06/2018  . PONV (postoperative nausea and vomiting)     Past Surgical History:  Procedure Laterality Date  . I&D EXTREMITY Left 10/26/2013    Procedure: IRRIGATION AND DEBRIDEMENT EXTREMITY,PARTIAL AMPUTATION LEFT INDEX FINGER;  Surgeon: Dayna Barker, MD;  Location: [place];  Service: Plastics;  Laterality: Left;  . I&D EXTREMITY Left 10/30/2013   Procedure: IRRIGATION AND DEBRIDEMENT AND REVISION AMPUTATION OF LEFT INDEX FINGER;  Surgeon: Dayna Barker, MD;  Location: [place] [facility];  Service: Plastics;  Laterality: Left;   Family History:  Family History  Problem Relation Age of Onset  . Hypertension Mother   . Diabetes Father   . Diabetes Sister   . Diabetes Brother   . Heart disease Brother    Family Psychiatric  History:None noted in chart  Social History:  Social History   Substance and Sexual Activity  Alcohol Use No  . Alcohol/week: 0.0 standard drinks     Social History   Substance and Sexual Activity  Drug Use No    Social History   Socioeconomic History  . Marital status: Legally Separated    Spouse name: Not on file  . Number of children: Not on file  . Years of education: Not on file  . Highest education level: Not on file  Occupational History  . Not on file  Social Needs  . Financial resource strain: Not on file  . Food insecurity    Worry: Not on file    Inability: Not on file  . Transportation needs    Medical: Not on file    Non-medical: Not on file  Tobacco Use  . Smoking status: Current Every Day Smoker    Packs/day: 1.00    Years: 20.00    Pack years: 20.00    Types: Cigarettes  . Smokeless tobacco: Current User    Types: Chew  Substance and Sexual Activity  . Alcohol use: No    Alcohol/week: 0.0 standard drinks  . Drug use: No  . Sexual activity: Not on file  Lifestyle  . Physical activity    Days per week: Not on file    Minutes per session: Not on file  . Stress: Not on file  Relationships  . Social Herbalist on phone: Not on file    Gets together: Not on file    Attends religious service: Not on file    Active member of club or organization: Not on file     Attends meetings of clubs or organizations: Not on file    Relationship status: Not on file  Other Topics Concern  . Not on file  Social History Narrative  . Not on file   Additional Social History:    Allergies:  No Known Allergies  Labs:  Results for orders placed or performed during the hospital encounter of 07/18/18 (from the past 48 hour(s))  Comprehensive metabolic panel     Status: Abnormal   Collection Time: 07/18/18 11:52 PM  Result Value Ref Range   Sodium 135 135 - 145 mmol/L   Potassium 4.7 3.5 - 5.1 mmol/L   Chloride 100 98 - 111 mmol/L   CO2 24 22 - 32 mmol/L   Glucose, Bld 286 (H) 70 - 99 mg/dL   BUN 27 (H) 6 - 20 mg/dL   Creatinine, Ser 2.72 (H) 0.61 - 1.24 mg/dL   Calcium 9.5 8.9 - 10.3 mg/dL   Total Protein 8.8 (  H) 6.5 - 8.1 g/dL   Albumin 3.0 (L) 3.5 - 5.0 g/dL   AST 13 (L) 15 - 41 U/L   ALT 13 0 - 44 U/L   Alkaline Phosphatase 112 38 - 126 U/L   Total Bilirubin 0.4 0.3 - 1.2 mg/dL   GFR calc non Af Amer 25 (L) >60 mL/min   GFR calc Af Amer 29 (L) >60 mL/min   Anion gap 11 5 - 15    Comment: Performed at [facility] [address] [place], [place] [address]  Ethanol     Status: None   Collection Time: 07/18/18 11:52 PM  Result Value Ref Range   Alcohol, Ethyl (B) <10 <10 mg/dL    Comment: (NOTE) Lowest detectable limit for serum alcohol is 10 mg/dL. For medical purposes only. Performed at [facility], [place] [address] [place], [place] [address]   Urine rapid drug screen (hosp performed)     Status: None   Collection Time: 07/18/18 11:52 PM  Result Value Ref Range   Opiates NONE DETECTED NONE DETECTED   Cocaine NONE DETECTED NONE DETECTED   Benzodiazepines NONE DETECTED NONE DETECTED   Amphetamines NONE DETECTED NONE DETECTED   Tetrahydrocannabinol NONE DETECTED NONE DETECTED   Barbiturates NONE DETECTED NONE DETECTED    Comment: (NOTE) DRUG SCREEN FOR MEDICAL PURPOSES ONLY.  IF CONFIRMATION IS NEEDED FOR ANY PURPOSE, NOTIFY  LAB WITHIN 5 DAYS. LOWEST DETECTABLE LIMITS FOR URINE DRUG SCREEN Drug Class                     Cutoff (ng/mL) Amphetamine and metabolites    1000 Barbiturate and metabolites    200 Benzodiazepine                 767 Tricyclics and metabolites     300 Opiates and metabolites        300 Cocaine and metabolites        300 THC                            50 Performed at [facility], [place] [address], [place], [place] [address]   CBC with Diff     Status: Abnormal   Collection Time: 07/18/18 11:52 PM  Result Value Ref Range   WBC 10.7 (H) 4.0 - 10.5 K/uL   RBC 4.45 4.22 - 5.81 MIL/uL   Hemoglobin 12.0 (L) 13.0 - 17.0 g/dL   HCT 38.8 (L) 39.0 - 52.0 %   MCV 87.2 80.0 - 100.0 fL   MCH 27.0 26.0 - 34.0 pg   MCHC 30.9 30.0 - 36.0 g/dL   RDW 12.9 11.5 - 15.5 %   Platelets 421 (H) 150 - 400 K/uL   nRBC 0.0 0.0 - 0.2 %   Neutrophils Relative % 64 %   Neutro Abs 6.9 1.7 - 7.7 K/uL   Lymphocytes Relative 23 %   Lymphs Abs 2.5 0.7 - 4.0 K/uL   Monocytes Relative 9 %   Monocytes Absolute 0.9 0.1 - 1.0 K/uL   Eosinophils Relative 2 %   Eosinophils Absolute 0.2 0.0 - 0.5 K/uL   Basophils Relative 1 %   Basophils Absolute 0.1 0.0 - 0.1 K/uL   Immature Granulocytes 1 %   Abs Immature Granulocytes 0.12 (H) 0.00 - 0.07 K/uL    Comment: Performed at [facility] [address] [place], [place] [address]  Salicylate level     Status: None   Collection Time: 07/18/18 11:52 PM  Result Value Ref Range   Salicylate Lvl <8.2 2.8 - 30.0 mg/dL    Comment: Performed at [facility] [address] [place], [place] [address]  Acetaminophen level     Status: Abnormal   Collection Time: 07/18/18 11:52 PM  Result Value Ref Range   Acetaminophen (Tylenol), Serum <10 (L) 10 - 30 ug/mL    Comment: (NOTE) Therapeutic concentrations vary significantly. A range of 10-30 ug/mL  may be an effective concentration for many patients. However, some  are best treated at concentrations  outside of this range. Acetaminophen concentrations >150 ug/mL at 4 hours after ingestion  and >50 ug/mL at 12 hours after ingestion are often associated with  toxic reactions. Performed at [facility], [place] [address] [place], [place] [address]   Urinalysis, Routine w reflex microscopic     Status: Abnormal   Collection Time: 07/18/18 11:52 PM  Result Value Ref Range   Color, Urine YELLOW YELLOW   APPearance CLOUDY (A) CLEAR   Specific Gravity, Urine 1.015 1.005 - 1.030   pH 6.0 5.0 - 8.0   Glucose, UA >=500 (A) NEGATIVE mg/dL   Hgb urine dipstick MODERATE (A) NEGATIVE   Bilirubin Urine NEGATIVE NEGATIVE   Ketones, ur NEGATIVE NEGATIVE mg/dL   Protein, ur 100 (A) NEGATIVE mg/dL   Nitrite NEGATIVE NEGATIVE   Leukocytes,Ua LARGE (A) NEGATIVE   RBC / HPF 21-50 0 - 5 RBC/hpf   WBC, UA >50 (H) 0 - 5 WBC/hpf   Bacteria, UA FEW (A) NONE SEEN   WBC Clumps PRESENT    Mucus PRESENT    Budding Yeast PRESENT     Comment: Performed at [facility] [address], [place], [place] [address]  SARS Coronavirus 2 (CEPHEID - Performed in [facility] hospital lab), Hosp Order     Status: None   Collection Time: 07/19/18  1:11 AM   Specimen: Nasopharyngeal Swab  Result Value Ref Range   SARS Coronavirus 2 NEGATIVE NEGATIVE    Comment: (NOTE) If result is NEGATIVE SARS-CoV-2 target nucleic acids are NOT DETECTED. The SARS-CoV-2 RNA is generally detectable in upper and lower  respiratory specimens during the acute phase of infection. The lowest  concentration of SARS-CoV-2 viral copies this assay can detect is 250  copies / mL. A negative result does not preclude SARS-CoV-2 infection  and should not be used as the sole basis for treatment or other  patient management decisions.  A negative result may occur with  improper specimen collection / handling, submission of specimen other  than nasopharyngeal swab, presence of viral mutation(s) within the  areas targeted by this assay,  and inadequate number of viral copies  (<250 copies / mL). A negative result must be combined with clinical  observations, patient history, and epidemiological information. If result is POSITIVE SARS-CoV-2 target nucleic acids are DETECTED. The SARS-CoV-2 RNA is generally detectable in upper and lower  respiratory specimens dur ing the acute phase of infection.  Positive  results are indicative of active infection with SARS-CoV-2.  Clinical  correlation with patient history and other diagnostic information is  necessary to determine patient infection status.  Positive results do  not rule out bacterial infection or co-infection with other viruses. If result is PRESUMPTIVE POSTIVE SARS-CoV-2 nucleic acids MAY BE PRESENT.   A presumptive positive result was obtained on the submitted specimen  and confirmed  on repeat testing.  While 2019 novel coronavirus  (SARS-CoV-2) nucleic acids may be present in the submitted sample  additional confirmatory testing may be necessary for epidemiological  and / or clinical management purposes  to differentiate between  SARS-CoV-2 and other Sarbecovirus currently known to infect humans.  If clinically indicated additional testing with an alternate test  methodology [id number]) is advised. The SARS-CoV-2 RNA is generally  detectable in upper and lower respiratory sp ecimens during the acute  phase of infection. The expected result is Negative. Fact Sheet for Patients:  StrictlyIdeas.no Fact Sheet for Healthcare Providers: BankingDealers.co.za This test is not yet approved or cleared by the [place] FDA and has been authorized for detection and/or diagnosis of SARS-CoV-2 by FDA under an Emergency Use Authorization (EUA).  This EUA will remain in effect (meaning this test can be used) for the duration of the COVID-19 declaration under Section 564(b)(1) of the Act, 21 U.S.C. section 360bbb-3(b)(1), unless  the authorization is terminated or revoked sooner. Performed at [facility], [place] [address], [place], [place] [address]   CBG monitoring, ED     Status: Abnormal   Collection Time: 07/19/18  7:37 AM  Result Value Ref Range   Glucose-Capillary 358 (H) 70 - 99 mg/dL   Comment 1 Notify RN    Comment 2 Document in Chart     Medications:  Current Facility-Administered Medications  Medication Dose Route Frequency Provider Last Rate Last Dose  . cephALEXin (KEFLEX) capsule 500 mg  500 mg Oral Q8H Ward, Kristen N, DO   500 mg at 07/19/18 1157  . insulin aspart (novoLOG) injection 5 Units  5 Units Subcutaneous TID WC Ward, Kristen N, DO   5 Units at 07/19/18 0816  . insulin detemir (LEVEMIR) injection 30 Units  30 Units Subcutaneous Daily Ward, Kristen N, DO      . tamsulosin (FLOMAX) capsule 0.4 mg  0.4 mg Oral QPC supper Ward, Kristen N, DO       Current Outpatient Medications  Medication Sig Dispense Refill  . blood glucose meter kit and supplies KIT Dispense based on patient and insurance preference. Use up to four times daily as directed. (FOR ICD-9 250.00, 250.01). 1 each 0  . cephALEXin (KEFLEX) 500 MG capsule Take 1 capsule (500 mg total) by mouth 3 (three) times daily. 21 capsule 0  . insulin aspart (NOVOLOG) 100 UNIT/ML injection Inject 5 Units into the skin 3 (three) times daily with meals. 10 mL 0  . insulin detemir (LEVEMIR) 100 UNIT/ML injection Inject 0.3 mLs (30 Units total) into the skin daily. 10 mL 0  . Insulin Syringe-Needle U-100 (INSULIN SYRINGE .5CC/31GX5/16") 31G X 5/16" 0.5 ML MISC Levemir 30 units daily and NovoLog 5 units 3 times a day with meals 100 each 0  . ondansetron (ZOFRAN) 4 MG tablet Take 1 tablet (4 mg total) by mouth every 6 (six) hours as needed for nausea. 20 tablet 0  . tamsulosin (FLOMAX) 0.4 MG CAPS capsule Take 1 capsule (0.4 mg total) by mouth daily after supper. 30 capsule 0    Musculoskeletal: Unable to assess as evaluation via telepsych   Psychiatric Specialty Exam: Physical Exam  Nursing note and vitals reviewed. Constitutional: He is oriented to person, place, and time.  Neurological: He is alert and oriented to person, place, and time.    Review of Systems  Psychiatric/Behavioral: Negative for depression, hallucinations, memory loss, substance abuse and suicidal ideas. The patient is not nervous/anxious and does not have  insomnia.   All other systems reviewed and are negative.   Blood pressure (!) 136/95, pulse 89, temperature 98.2 F (36.8 C), temperature source Oral, resp. rate 16, height 6' 4" (1.93 m), weight 64.4 kg, SpO2 100 %.Body mass index is 17.28 kg/m.  General Appearance: Fairly Groomed  Eye Contact:  Good  Speech:  Clear and Coherent  Volume:  Normal  Mood:  Anxious  Affect:  Congruent  Thought Process:  Coherent and Descriptions of Associations: Circumstantial  Orientation:  Full (Time, Place, and Person)  Thought Content:  Rumination  Suicidal Thoughts:  No  Homicidal Thoughts:  No  Memory:  Immediate;   Fair Recent;   Fair  Judgement:  Fair  Insight:  Fair  Psychomotor Activity:  Normal  Concentration:  Concentration: Fair and Attention Span: Fair  Recall:  [employer] of Knowledge:  Fair  Language:  Good  Akathisia:  Negative  Handed:  Right  AIMS (if indicated):     Assets:  Communication Skills Desire for Improvement Resilience  ADL's:  Intact  Cognition:  WNL  Sleep:        Treatment Plan Summary: Daily contact with patient to assess and evaluate symptoms and progress in treatment  Disposition: No evidence of imminent risk to self or others at present.   Patient does not meet criteria for psychiatric inpatient admission. Recommending social work consult to discuss concerns as noted above.  Recommend resources be provided for outpatient psychiatry.  .    ED updated on current disposition.   This service was provided via telemedicine using a 2-way, interactive audio and  video technology.  Names of all persons participating in this telemedicine service and their role in this encounter. Name: Mordecai Maes  Role: FNP-C  Name: Dorothy Spark  Role: Patient    Mordecai Maes, NP 07/19/2018 8:33 AM

## 2018-07-19 NOTE — ED Notes (Signed)
Breakfast ordered 

## 2018-07-19 NOTE — ED Notes (Signed)
Pt has had meal and drink without difficulty

## 2018-07-19 NOTE — Progress Notes (Signed)
Patient psych cleared per Mordecai Maes, NP.  Mental Health referral information faxed and referral to [facility] included in discharge Follow-up Provider instructions.  Areatha Keas. Judi Cong, MSW, [place] Disposition Clinical Social Work [phone] (cell) [phone] (office)

## 2018-07-19 NOTE — ED Notes (Signed)
Pt was given cab voucher and escorted outside. [facility] was called to come pick pt up. Pt requested to wait outside- Sort rn given the cab Volcher and made aware of the patient waiting on a cab.

## 2018-07-19 NOTE — ED Provider Notes (Signed)
  Physical Exam  BP (!) 136/95 (BP Location: Right Arm)   Pulse 89   Temp 98.2 F (36.8 C) (Oral)   Resp 16   Ht 6\' 4"  (1.93 m)   Wt 64.4 kg   SpO2 100%   BMI 17.28 kg/m   Physical Exam Constitutional:      Appearance: He is well-developed.     Comments: Poor hygiene   HENT:     Head: Normocephalic.     Nose: Nose normal.  Eyes:     General: Lids are normal.  Neck:     Musculoskeletal: Normal range of motion.  Cardiovascular:     Rate and Rhythm: Normal rate.  Pulmonary:     Effort: Pulmonary effort is normal. No respiratory distress.  Musculoskeletal: Normal range of motion.  Neurological:     Mental Status: He is alert.     Comments: Awake. Alert. Speech is normal.   Psychiatric:        Behavior: Behavior normal.     ED Course/Procedures     Procedures  MDM   0955: pt evaluated. Cleared by psych. I have reviewed pt's ER work up remarkable for UTI per previous provider to dc with keflex.  Creatinine and hydronephrosis has improved based on labs and renal US today. He had a foley replaced in ER due to unable to void and bladder retention.  SW has contacted patient and states his mother can help him with medicines. Per SW pt is ok for discharge. We attempted to call patient's mother several times but was not picking up. No further medical needs here. Will dc with keflex, PCP and urology f/u for foley care, and OP resources given by psych and SW.       Kinnie Feil, PA-C 07/19/18 9675    Lacretia Leigh, MD 07/20/18 [phone]

## 2018-07-19 NOTE — ED Notes (Addendum)
[phone] - pt mother/caretaker Timothy Madden   *other number found in chart for "Timothy Madden" listed as [phone]

## 2018-07-19 NOTE — ED Notes (Signed)
Patient transported to [facility] 

## 2018-07-19 NOTE — Discharge Instructions (Addendum)
You have a bladder infection, take keflex as prescribed  You were not able to urinate on your own so a foley had to be replaced today  Follow up with primary care doctor and urology for re-evaluation of your kidney function and urine output and foley  Return for fever greater than 100, difficulty urinating, abdominal or flank pain

## 2018-07-19 NOTE — ED Notes (Signed)
Unable to get in touch with pt mother to pick him up. Unable to verify someone is at the patient's house. Being sent to voicemail on number left for mother. Attempted both numbers in chart.

## 2018-07-19 NOTE — BH Assessment (Signed)
Tele Assessment Note   Patient Name: Timothy Madden MRN: [id number] Referring Physician: Pryor Curia, DO Location of Patient: [facility] Location of Provider: [place] is an 58 y.o. male.  -Clinician reviewed note by Dr. Pryor Curia.  Patient also thinks that he would benefit from seeing a psychiatrist.  He denies SI, HI or hallucinations.  He reports history of schizophrenia.  He does have flight of ideas, rapid pressured speech here in the emergency department.  Patient is difficult to get a clear history from secondary to this.  He begins talking about his Foley catheter and then quickly moves into needing soap and Listerine and how these things can be obtained from [employer] and then tells me that he has a little dog at home and then states "if I were to beat my wife, you would be mad at me.  If I were to hurt my dog, you would be mad at me".  Patient is alert and oriented x3.  He said he was at [facility] to have Foley catheter removed.  He was to have followed up with urology but did not.  Patient has pressured speech and does have some flight of ideas.  He talks about needing to get Listerine and other personal care products.  Patient lives with his mother.  He says he does not have a cell phone.  He says he has gone to "[facility]" but cannot say if he has gone to any recent appointments.  Patient denies any SI or HI.  He denies A/V hallucinations but appears t be responding to internal stimuli.  Patient has good eye contact.  He will wander in conversation and can easily be redirected.  Speech is not goal oriented.  He denies use of ETOH or other drugs.    Patient denies actually having any outpatient care.  He says he was inpatient at [facility] many years ago.  -Clinician discussed patient care with [place] De[place].  He said patient should be observed overnight.  Patient discussed with Dr. Leonides Schanz.  She said she would place a  social work consult.  She asked for AM psych evaluation.  Diagnosis: F20.9 Schizophrenia  Past Medical History:  Past Medical History:  Diagnosis Date  . Amputation of finger, left   . Complication of anesthesia   . Diabetes mellitus without complication ([place])   . DKA (diabetic ketoacidoses) ([place]) 06/2018  . PONV (postoperative nausea and vomiting)     Past Surgical History:  Procedure Laterality Date  . I&D EXTREMITY Left 10/26/2013   Procedure: IRRIGATION AND DEBRIDEMENT EXTREMITY,PARTIAL AMPUTATION LEFT INDEX FINGER;  Surgeon: Dayna Barker, MD;  Location: [place];  Service: Plastics;  Laterality: Left;  . I&D EXTREMITY Left 10/30/2013   Procedure: IRRIGATION AND DEBRIDEMENT AND REVISION AMPUTATION OF LEFT INDEX FINGER;  Surgeon: Dayna Barker, MD;  Location: [place];  Service: Plastics;  Laterality: Left;    Family History:  Family History  Problem Relation Age of Onset  . Hypertension Mother   . Diabetes Father   . Diabetes Sister   . Diabetes Brother   . Heart disease Brother     Social History:  reports that he has been smoking cigarettes. He has a 20.00 pack-year smoking history. His smokeless tobacco use includes chew. He reports that he does not drink alcohol or use drugs.  Additional Social History:  Alcohol / Drug Use Pain Medications: See PTA medication list Prescriptions: See PTA medication list Over the  Counter: See PTA medication list History of alcohol / drug use?: No history of alcohol / drug abuse  CIWA: CIWA-Ar BP: 101/69 Pulse Rate: 100 COWS:    Allergies: No Known Allergies  Home Medications: (Not in a hospital admission)   OB/GYN Status:  No LMP for male patient.  General Assessment Data Location of Assessment: [facility] ED TTS Assessment: In system Is this a Tele or Face-to-Face Assessment?: Tele Assessment Is this an Initial Assessment or a Re-assessment for this encounter?: Initial Assessment Patient Accompanied by:: N/A Language Other than  English: No Living Arrangements: Other (Comment)(Pt lives with mother.) What gender do you identify as?: Male Marital status: Separated Pregnancy Status: No Living Arrangements: Parent Can pt return to current living arrangement?: Yes Admission Status: Voluntary Is patient capable of signing voluntary admission?: Yes Referral Source: Self/Family/Friend Insurance type: potential MCD     Crisis Care Plan Living Arrangements: Parent Name of Psychiatrist: None Name of Therapist: None  Education Status Is patient currently in school?: No Is the patient employed, unemployed or receiving disability?: Unemployed  Risk to self with the past 6 months Suicidal Ideation: No Has patient been a risk to self within the past 6 months prior to admission? : No Suicidal Intent: No Has patient had any suicidal intent within the past 6 months prior to admission? : No Is patient at risk for suicide?: No Suicidal Plan?: No Has patient had any suicidal plan within the past 6 months prior to admission? : No Access to Means: No What has been your use of drugs/alcohol within the last 12 months?: Denies Previous Attempts/Gestures: No How many times?: 0 Other Self Harm Risks: None Triggers for Past Attempts: None known Intentional Self Injurious Behavior: None Family Suicide History: No Recent stressful life event(s): Turmoil (Comment) Persecutory voices/beliefs?: No Depression: Yes Depression Symptoms: Despondent, Loss of interest in usual pleasures, Insomnia Substance abuse history and/or treatment for substance abuse?: No Suicide prevention information given to non-admitted patients: Not applicable  Risk to Others within the past 6 months Homicidal Ideation: No Does patient have any lifetime risk of violence toward others beyond the six months prior to admission? : No Thoughts of Harm to Others: No Current Homicidal Intent: No Current Homicidal Plan: No Access to Homicidal Means:  No Identified Victim: No one History of harm to others?: No Assessment of Violence: None Noted Violent Behavior Description: None noted Does patient have access to weapons?: No Criminal Charges Pending?: No Does patient have a court date: No Is patient on probation?: No  Psychosis Hallucinations: Auditory(Pt says in the past.) Delusions: None noted  Mental Status Report Appearance/Hygiene: Disheveled, In scrubs Eye Contact: Good Motor Activity: Freedom of movement, Unsteady Speech: Incoherent, Pressured Level of Consciousness: Alert Mood: Depressed, Anxious Affect: Anxious, Depressed Anxiety Level: Moderate Thought Processes: Irrelevant, Flight of Ideas Judgement: Impaired Orientation: Person, Place, Situation Obsessive Compulsive Thoughts/Behaviors: None  Cognitive Functioning Concentration: Decreased Memory: Recent Impaired, Remote Impaired Is patient IDD: No Insight: Poor Impulse Control: Poor Appetite: Good Have you had any weight changes? : No Change Sleep: No Change Total Hours of Sleep: 8 Vegetative Symptoms: None  ADLScreening [facility] Assessment Services) Patient's cognitive ability adequate to safely complete daily activities?: Yes Patient able to express need for assistance with ADLs?: Yes Independently performs ADLs?: Yes (appropriate for developmental age)  Prior Inpatient Therapy Prior Inpatient Therapy: Yes Prior Therapy Dates: 12 years ago Prior Therapy Facilty/Provider(s): [facility] Reason for Treatment: psychosis  Prior Outpatient Therapy Prior Outpatient Therapy: No Does patient have  an ACCT team?: No Does patient have Intensive In-House Services?  : No Does patient have [facility] services? : No Does patient have P4CC services?: No  ADL Screening (condition at time of admission) Patient's cognitive ability adequate to safely complete daily activities?: Yes Is the patient deaf or have difficulty hearing?: No Does the patient have difficulty seeing,  even when wearing glasses/contacts?: No Does the patient have difficulty concentrating, remembering, or making decisions?: No Patient able to express need for assistance with ADLs?: Yes Does the patient have difficulty dressing or bathing?: No Independently performs ADLs?: Yes (appropriate for developmental age) Does the patient have difficulty walking or climbing stairs?: No Weakness of Legs: None Weakness of Arms/Hands: None       Abuse/Neglect Assessment (Assessment to be complete while patient is alone) Abuse/Neglect Assessment Can Be Completed: Yes Physical Abuse: Denies Verbal Abuse: Denies Sexual Abuse: Denies Exploitation of patient/patient's resources: Denies Self-Neglect: Denies     Regulatory affairs officer (For Healthcare) Does Patient Have a Medical Advance Directive?: No Would patient like information on creating a medical advance directive?: No - Patient declined       Child/Adolescent Assessment Running Away Risk: Denies  Disposition:  Disposition Initial Assessment Completed for this Encounter: Yes Patient referred to: Other (Comment)(AM psych eval)  This service was provided via telemedicine using a 2-way, interactive audio and video technology.  Names of all persons participating in this telemedicine service and their role in this encounter. Name: Timothy Madden Role: patient  Name: Curlene Dolphin, M.S. LCAS QP Role: clinician  Name:  Role:   Name:  Role:     Raymondo Band 07/19/2018 3:30 AM

## 2018-07-19 NOTE — ED Notes (Signed)
Pt belongings removed, security wanded, pt belongings placed in locker 13 in purple zone lockers

## 2018-07-19 NOTE — ED Notes (Signed)
Was able to contact pts aunt who is 57 years old. She is not able to pick him up but states the patients mother does not typically leave the apartment where this patient lives. Pt came to the ER in a cab and is requesting we let him leave in a cab. I spoke with Dr. Gustavus Messing and he also agrees this will be a safe way for the patient to get back home. Pt states he will be able to get inside of the apartment. Pt was given back all of his belonging and discharge papers.

## 2018-07-19 NOTE — Progress Notes (Addendum)
9:30a CSW called RN to ask patient if he needed assistance with getting his medication. Per RN patient reports his mom can help him get the meds. RN attempted to call patient's mother to verify this, but was unable to reach her. No other social work needs stated. CSW signing off.   CSW aware of consult for this patient. CSW contacted Dr. Sherry Ruffing who reports patient may need medication assistance. Looking at patient's chart it appears he does not have insurance, but could qualify for medicaid. CSW awaiting patient to be psych disposition and will follow up as needed.   Golden Circle, LCSW Transitions of Care Department [facility] ED [phone]

## 2018-07-19 NOTE — ED Notes (Signed)
TTS in progress 

## 2018-08-21 ENCOUNTER — Encounter (HOSPITAL_COMMUNITY): Payer: Self-pay | Admitting: Family Medicine

## 2018-08-21 ENCOUNTER — Other Ambulatory Visit: Payer: Self-pay

## 2018-08-21 DIAGNOSIS — F17210 Nicotine dependence, cigarettes, uncomplicated: Secondary | ICD-10-CM | POA: Insufficient documentation

## 2018-08-21 DIAGNOSIS — R460 Very low level of personal hygiene: Secondary | ICD-10-CM | POA: Insufficient documentation

## 2018-08-21 DIAGNOSIS — I10 Essential (primary) hypertension: Secondary | ICD-10-CM | POA: Insufficient documentation

## 2018-08-21 DIAGNOSIS — Z79899 Other long term (current) drug therapy: Secondary | ICD-10-CM | POA: Insufficient documentation

## 2018-08-21 DIAGNOSIS — Z794 Long term (current) use of insulin: Secondary | ICD-10-CM | POA: Insufficient documentation

## 2018-08-21 DIAGNOSIS — E119 Type 2 diabetes mellitus without complications: Secondary | ICD-10-CM | POA: Insufficient documentation

## 2018-08-21 DIAGNOSIS — Z960 Presence of urogenital implants: Secondary | ICD-10-CM | POA: Insufficient documentation

## 2018-08-21 DIAGNOSIS — F79 Unspecified intellectual disabilities: Secondary | ICD-10-CM | POA: Insufficient documentation

## 2018-08-21 DIAGNOSIS — R2243 Localized swelling, mass and lump, lower limb, bilateral: Secondary | ICD-10-CM | POA: Insufficient documentation

## 2018-08-21 NOTE — ED Triage Notes (Signed)
Patient is from home and transported by [facility]. Patient is complaining of bilateral lower extremity swelling and not had his insulin in 2 days. PTAR is concerned that patient needs a social work consult due to his living conditions, unable to get his medications, and family unwilling to not care for him while they live with him in the same residence.

## 2018-08-22 ENCOUNTER — Emergency Department (HOSPITAL_COMMUNITY)
Admission: EM | Admit: 2018-08-22 | Discharge: 2018-08-22 | Disposition: A | Discharge status: Home / Self Care | Payer: Self-pay | Attending: Emergency Medicine | Admitting: Emergency Medicine

## 2018-08-22 DIAGNOSIS — R460 Very low level of personal hygiene: Secondary | ICD-10-CM

## 2018-08-22 DIAGNOSIS — F79 Unspecified intellectual disabilities: Secondary | ICD-10-CM

## 2018-08-22 LAB — COMPREHENSIVE METABOLIC PANEL
ALT: 8 U/L (ref 0–44)
AST: 10 U/L — ABNORMAL LOW (ref 15–41)
Albumin: 3.2 g/dL — ABNORMAL LOW (ref 3.5–5.0)
Alkaline Phosphatase: 101 U/L (ref 38–126)
Anion gap: 9 (ref 5–15)
BUN: 26 mg/dL — ABNORMAL HIGH (ref 6–20)
CO2: 23 mmol/L (ref 22–32)
Calcium: 9 mg/dL (ref 8.9–10.3)
Chloride: 101 mmol/L (ref 98–111)
Creatinine, Ser: 2.47 mg/dL — ABNORMAL HIGH (ref 0.61–1.24)
GFR calc Af Amer: 32 mL/min — ABNORMAL LOW (ref >60)
GFR calc non Af Amer: 28 mL/min — ABNORMAL LOW (ref >60)
Glucose, Bld: 338 mg/dL — ABNORMAL HIGH (ref 70–99)
Potassium: 4.3 mmol/L (ref 3.5–5.1)
Sodium: 133 mmol/L — ABNORMAL LOW (ref 135–145)
Total Bilirubin: 0.4 mg/dL (ref 0.3–1.2)
Total Protein: 8.1 g/dL (ref 6.5–8.1)

## 2018-08-22 LAB — CBC WITH DIFFERENTIAL/PLATELET
Abs Immature Granulocytes: 0.06 10*3/uL (ref 0.00–0.07)
Basophils Absolute: 0.1 10*3/uL (ref 0.0–0.1)
Basophils Relative: 1 %
Eosinophils Absolute: 0.1 10*3/uL (ref 0.0–0.5)
Eosinophils Relative: 1 %
HCT: 34.2 % — ABNORMAL LOW (ref 39.0–52.0)
Hemoglobin: 10.5 g/dL — ABNORMAL LOW (ref 13.0–17.0)
Immature Granulocytes: 1 %
Lymphocytes Relative: 18 %
Lymphs Abs: 1.8 10*3/uL (ref 0.7–4.0)
MCH: 26.3 pg (ref 26.0–34.0)
MCHC: 30.7 g/dL (ref 30.0–36.0)
MCV: 85.7 fL (ref 80.0–100.0)
Monocytes Absolute: 0.8 10*3/uL (ref 0.1–1.0)
Monocytes Relative: 8 %
Neutro Abs: 7.1 10*3/uL (ref 1.7–7.7)
Neutrophils Relative %: 71 %
Platelets: 374 10*3/uL (ref 150–400)
RBC: 3.99 MIL/uL — ABNORMAL LOW (ref 4.22–5.81)
RDW: 14.2 % (ref 11.5–15.5)
WBC: 10 10*3/uL (ref 4.0–10.5)
nRBC: 0 % (ref 0.0–0.2)

## 2018-08-22 LAB — URINALYSIS, ROUTINE W REFLEX MICROSCOPIC
Bilirubin Urine: NEGATIVE
Glucose, UA: >=500 mg/dL — AB
Ketones, ur: NEGATIVE mg/dL
Nitrite: NEGATIVE
Protein, ur: 30 mg/dL — AB
Specific Gravity, Urine: 1.007 (ref 1.005–1.030)
WBC, UA: >50 WBC/hpf — ABNORMAL HIGH (ref 0–5)
pH: 6 (ref 5.0–8.0)

## 2018-08-22 NOTE — ED Notes (Signed)
Provided patient a meal tray.

## 2018-08-22 NOTE — ED Notes (Signed)
Pt removed out of his belongings, showered thoroughly, and placed in blue scrubs. All belongings were double bagged in a biohazard bag. Pt assisted back into bed via steady. Bed in locked and lowest position and call bell within reach.

## 2018-08-22 NOTE — ED Notes (Signed)
Pt with roaches crawling from his clothing. Pt had roaches in his shoes. Pt attempted to clear the roaches from his shoes by knocking his shoes on the floor.

## 2018-08-22 NOTE — Discharge Instructions (Signed)
Please follow-up with urology for your catheter management.  You were given an ample resources to obtain Medicare or Medicaid to help with cost in further follow-up for your health, please call and schedule appointments as needed.

## 2018-08-22 NOTE — TOC Initial Note (Addendum)
Transition of Care [facility]) - Initial/Assessment Note    Patient Details  Name: Timothy Madden MRN: [id number] Date of Birth: 1960/10/04  Transition of Care [facility]) CM/SW Contact:    Janace Hoard, LCSW Phone Number: 08/22/2018, 10:27 AM  Clinical Narrative:                 CSW spoke with PA Johana to update her on patient. PA reports they are recommending that patient be set up with [facility] or placement. Patient was agreeable to [facility] services. CSW explained that patient does not have insurance, but he may be able to be set up with [facility] through charity. CSW called Ronalee Belts with [facility]. Ronalee Belts explained that he will need to ask patient some questions about his financial status to make the determination to start services with him. CSW called Ronalee Belts from patient's room to complete the assessment.   10:45a Patient completed assessment with Ronalee Belts. Patient stated he is okay with going back home. CSW reached back out to [place] stated that because patient lives with his mother, Ronalee Belts will need to get information on mom's income. Ronalee Belts reports patient stated he will reach out to his mother to provide that information. Ronalee Belts also has concerns about patient's living condition with the infestation of roaches and he will have to talk to his company about this as they can not put their employees in danger. CSW understood this concern. Ronalee Belts report he will call back with an update. CSW waiting on RN CM to arrive to assist patient with medication needs.  11:55a CSW received call from [place] who reports they will be denying patient services due to the unsafe home conditions for their employees. There are no further social work needs. CSW signing off and RNCM Jonnie Finner is assisting with medication needs.  Expected Discharge Plan: [place] Barriers to Discharge: Inadequate or no insurance   Patient Goals and CMS Choice Patient states their goals for this hospitalization and ongoing  recovery are:: assistant with medical needs CMS Medicare.gov Compare Post Acute Care list provided to:: Patient Choice offered to / list presented to : Patient  Expected Discharge Plan and Services Expected Discharge Plan: [place] Choice: [place] arrangements for the past 2 months: Apartment                           HH Arranged: PT, OT, Nurse's Aide, RN, Social Work [employer] Agency: [facility] [employer] [facility] [employer] Date [place]: 08/22/18 Time [place]: [phone] Representative spoke with at [place]: Burlene Arnt  Prior Living Arrangements/Services Living arrangements for the past 2 months: Apartment Lives with:: Other (Comment)(Mother) Patient language and need for interpreter reviewed:: Yes Do you feel safe going back to the place where you live?: Yes      Need for Family Participation in Patient Care: Yes (Comment) Care giver support system in place?: Yes (comment)   Criminal Activity/Legal Involvement Pertinent to Current Situation/Hospitalization: No - Comment as needed  Activities of Daily Living      Permission Sought/Granted Permission sought to share information with : Family Supports(Home Health Agencies) Permission granted to share information with : Yes, Verbal Permission Granted     Permission granted to share info w AGENCY: [facility]        Emotional Assessment Appearance:: Disheveled, Appears stated  age Attitude/Demeanor/Rapport: Gracious, Engaged Affect (typically observed): Accepting, Hopeful, Calm, Pleasant Orientation: : Oriented to Self, Oriented to Place, Oriented to  Time, Oriented to Situation      Admission diagnosis:  Foot Swelling  Patient Active Problem List   Diagnosis Date Noted  . DKA, type 2 ([place]) 06/28/2018  . Acute renal failure superimposed on chronic kidney disease ([place]) 06/28/2018  . Elevated lipase 06/28/2018  . Osteomyelitis of finger of  left hand ([place]) 10/26/2013  . Osteomyelitis ([place]) 10/26/2013  . Sepsis ([place]) 10/26/2013  . AKI (acute kidney injury) ([place]) 10/26/2013  . Hyponatremia 10/26/2013  . Type I diabetes mellitus with complication, uncontrolled ([place]) 10/26/2013  . Essential hypertension 10/26/2013  . Anemia of chronic disease 10/26/2013  . PSYCHIATRIC DISORDER 07/19/2006  . TOBACCO ABUSE 07/19/2006  . DIABETES MELLITUS, TYPE II 11/30/2005  . ANXIETY 11/30/2005   PCP:  Marliss Coots, NP Pharmacy:   [facility] [place], [address] [place] [address] Phone: [phone] Fax: [phone]  [facility] Trans[facility] of [place], [place] [address] [place] [address] Phone: [phone] Fax: [phone]     Social Determinants of Health (SDOH) Interventions    Readmission Risk Interventions No flowsheet data found.

## 2018-08-22 NOTE — Progress Notes (Signed)
CSW spoke with patient regarding his current living situation and need for medication assistance. Patient reports he lives with his mother and admits the living conditions are poor. He reports he does not feel unsafe at home, but from previous presentations to the ED, it was noted patient reported no one cares for him at home. Patient reports he was supposed to get [facility], but unsure if this was set up and what agency he was to be set up with. He says his home health needs were for his urinary catheter. Patient reports his mother was assisting him in paying for his medications, but can no longer do this. Patient reports he is wanting to apply for disability and Medicaid.   CSW to file an APS report for patient to get him more assistance at home.   [place] Circle, LCSW Transitions of Care Department [facility] ED [phone]

## 2018-08-22 NOTE — ED Provider Notes (Addendum)
  Physical Exam  BP 134/72   Pulse 99   Temp 98.7 F (37.1 C) (Oral)   Resp 15   SpO2 99%   Physical Exam  ED Course/Procedures     Procedures  MDM  Patient care assumed from [place]. PA at shift change, please see her note for full HPI.  Briefly, patient arrived from home covered in roaches, stating he needs his urine catheter type, did not have any symptoms such as abdominal pain vomiting shortness of breath or chest pain.  He currently lives with his mother who is taking care of him.  According to my colleague patient will likely need home health set up, his current condition is not suitable for care.  Social work consult is pending.  Will ultimately need placement.  Attempted to reach mother multiple times throughout the day without any response.  9:28 AM Spoke to [employer] who has filed an adult protective services. Case manager Elmo Putt will not be in til 11 am, who will help with medication assistance.From a social stand point, he will need to apply for medicare and medicaid. Due to insurance status, he might not be eligible for home health.   Spoke to case management [place] who reports patient is unable to obtain home health as he does not have insurance, he also has a unsafe home environment, he will ultimately need to go back home.  Home health has been file for him for walker, labs were obtained to further evaluate patient's condition, these are consistent with his previous visits, a urine culture was sent off.  He was given ample resources to file for insurance coverage.   2:05 PM spoke to patient, he reports he does not have any complaints, his Foley catheter was changed last night, he reports no pain around catheter or site.  Urine at the bedside does not appear bloody.  Patient will return back home with resources.  Gilford Rile will be provided and delivered to the ED, this is pending.  No medical reason to keep patient in the hospital, will have him follow-up outpatient with urology.   Unable to obtain placement at this time.  Patient understands condition. I have discussed case with Dr. Melina Copa.  Patient stable for discharge.  Portions of this note were generated with Lobbyist. Dictation errors may occur despite best attempts at proofreading.     Janeece Fitting, PA-C 08/22/18 1406    Janeece Fitting, PA-C 08/22/18 1407    Hayden Rasmussen, MD 08/22/18 1704

## 2018-08-22 NOTE — Progress Notes (Signed)
TOC CM contacted [place] for [place] with seat. Orders placed. Requested DME be delivered to room.   3:21 pm Spoke to [place]. States pt with need a TOC Dept LOG for [place]. They will deliver to ED room prior to dc. Jonnie Finner RN CCM Case Mgmt phone [phone]

## 2018-08-22 NOTE — Progress Notes (Signed)
Medicare.gov - the Conservation officer, historic buildings for [place] health agencies that serve [place], [place]. [place] Quality of Patient Care Rating Patient Survey Summary Rating [facility] [phone] 4 out of 5 stars 4 out of [place] [phone] 3 out of 5 stars 5 out of [place] [phone] 3 out of 5 stars 4 out of [place] [phone] 3  out of 5 stars 4 out of [place] [phone] 4  out of 5 stars 3 out of [place] [phone] 4 out of 5 stars 4 out of [place] [phone] 4  out of 5 stars 4 out of [place] [phone] 4 out of 5 stars 4 out of [place] [phone] 4 out of 5 stars 4 out of 5 stars [facility] [place] [phone] 3  out of 5 stars 4 out of [place] [phone] 3 out of 5 stars [facility] [place] [phone] 2  out of 5 stars 3 out of [place] [phone] 3 out of 5 stars 4 out of 5 stars [facility] ([phone]) [phone] 4 out of 5 stars Not [place] [phone] 3 out of 5 stars 4 out of [place] [phone] Not Available5 Not Available12 [facility] [phone] 3  out of 5 stars 3 out of [place] [phone] 4 out of 5 stars 4 out of 5 stars [facility] [phone] 3  out of 5 stars 4 out of [place] [phone] 4  out of 5 stars 3 out of [place] [phone] 5 out of 5 stars 4 out of [place] [phone] 3  out of 5 stars 4 out of [place] [phone] Not Available5 Not [place] [phone] 3  out of 5 stars 3 out of [place] [phone] 3  out of 5 stars Not [place] [phone] 3  out of 5 stars 4 out of [place] [phone] 4  out of 5 stars 3 out of [place] [phone] 4  out of 5 stars 2 out of 5 stars

## 2018-08-22 NOTE — ED Provider Notes (Signed)
[facility] DEPT Provider Note   CSN: [id number] Arrival date & time: 08/21/18  2150     History   Chief Complaint Chief Complaint  Patient presents with  . Leg Swelling    HPI Timothy Madden is a 58 y.o. male.     Patient to ED with various complaints including swelling of feet and legs with discomfort. Unable to say how long this has been going on. He also reports "I need my bag checked" referring to his urinary catheter. He denies abdominal pain, vomiting, SOB, cough, chest pain. He states he lives with his mother but that she does not take care of him. He does not feel clean and states he is not given what he needs to stay clean. The patient arrives infested with roaches that are throughout his clothing and in his shoes. Per EMS report, patient living in poor conditions with concern family is not caring for him.   The history is provided by the patient. No language interpreter was used.    Past Medical History:  Diagnosis Date  . Amputation of finger, left   . Complication of anesthesia   . Diabetes mellitus without complication ([place])   . DKA (diabetic ketoacidoses) ([place]) 06/2018  . PONV (postoperative nausea and vomiting)     Patient Active Problem List   Diagnosis Date Noted  . DKA, type 2 ([place]) 06/28/2018  . Acute renal failure superimposed on chronic kidney disease ([place]) 06/28/2018  . Elevated lipase 06/28/2018  . Osteomyelitis of finger of left hand ([place]) 10/26/2013  . Osteomyelitis ([place]) 10/26/2013  . Sepsis ([place]) 10/26/2013  . AKI (acute kidney injury) ([place]) 10/26/2013  . Hyponatremia 10/26/2013  . Type I diabetes mellitus with complication, uncontrolled ([place]) 10/26/2013  . Essential hypertension 10/26/2013  . Anemia of chronic disease 10/26/2013  . PSYCHIATRIC DISORDER 07/19/2006  . TOBACCO ABUSE 07/19/2006  . DIABETES MELLITUS, TYPE II 11/30/2005  . ANXIETY 11/30/2005    Past Surgical History:  Procedure Laterality  Date  . I&D EXTREMITY Left 10/26/2013   Procedure: IRRIGATION AND DEBRIDEMENT EXTREMITY,PARTIAL AMPUTATION LEFT INDEX FINGER;  Surgeon: Dayna Barker, MD;  Location: [facility];  Service: Plastics;  Laterality: Left;  . I&D EXTREMITY Left 10/30/2013   Procedure: IRRIGATION AND DEBRIDEMENT AND REVISION AMPUTATION OF LEFT INDEX FINGER;  Surgeon: Dayna Barker, MD;  Location: [facility];  Service: Plastics;  Laterality: Left;        Home Medications    Prior to Admission medications   Medication Sig Start Date End Date Taking? Authorizing Provider  blood glucose meter kit and supplies KIT Dispense based on patient and insurance preference. Use up to four times daily as directed. (FOR ICD-9 250.00, 250.01). 07/02/18   Aline August, MD  cephALEXin (KEFLEX) 500 MG capsule Take 1 capsule (500 mg total) by mouth 3 (three) times daily. 07/19/18   Ward, Delice Bison, DO  insulin aspart (NOVOLOG) 100 UNIT/ML injection Inject 5 Units into the skin 3 (three) times daily with meals. 07/02/18   Aline August, MD  insulin detemir (LEVEMIR) 100 UNIT/ML injection Inject 0.3 mLs (30 Units total) into the skin daily. 07/03/18   Aline August, MD  Insulin Syringe-Needle U-100 (INSULIN SYRINGE .5CC/31GX5/16") 31G X 5/16" 0.5 ML MISC Levemir 30 units daily and NovoLog 5 units 3 times a day with meals 07/02/18   Aline August, MD  ondansetron (ZOFRAN) 4 MG tablet Take 1 tablet (4 mg total) by mouth every 6 (six) hours as needed for nausea. 07/02/18  Aline August, MD  tamsulosin (FLOMAX) 0.4 MG CAPS capsule Take 1 capsule (0.4 mg total) by mouth daily after supper. 07/02/18   Aline August, MD    Family History Family History  Problem Relation Age of Onset  . Hypertension Mother   . Diabetes Father   . Diabetes Sister   . Diabetes Brother   . Heart disease Brother     Social History Social History   Tobacco Use  . Smoking status: Current Every Day Smoker    Packs/day: 1.00    Years: 20.00    Pack years: 20.00     Types: Cigarettes  . Smokeless tobacco: Current User    Types: Chew  Substance Use Topics  . Alcohol use: No    Alcohol/week: 0.0 standard drinks  . Drug use: No     Allergies   Patient has no known allergies.   Review of Systems Review of Systems  Constitutional: Negative for chills and fever.  HENT: Negative.   Respiratory: Negative.   Cardiovascular: Positive for leg swelling.  Gastrointestinal: Negative.   Genitourinary:       Needs foley catheter attention.  Musculoskeletal: Negative.        See HPI.  Skin: Negative.   Neurological: Negative.      Physical Exam Updated Vital Signs BP (!) 135/93 (BP Location: Left Arm)   Pulse (!) 108   Temp 98.7 F (37.1 C) (Oral)   Resp 18   SpO2 100%   Physical Exam Constitutional:      Appearance: He is well-developed.  HENT:     Head: Normocephalic and atraumatic.  Neck:     Musculoskeletal: Normal range of motion and neck supple.  Cardiovascular:     Rate and Rhythm: Normal rate and regular rhythm.  Pulmonary:     Effort: Pulmonary effort is normal.     Breath sounds: Normal breath sounds. No wheezing, rhonchi or rales.  Abdominal:     General: Bowel sounds are normal.     Palpations: Abdomen is soft.     Tenderness: There is no abdominal tenderness. There is no guarding or rebound.  Genitourinary:    Comments: Foley catheter in place with yellow urine in collection bag.  Musculoskeletal: Normal range of motion.     Comments: Bilateral foot swelling without evidence of cellulitic changes.   Skin:    General: Skin is warm and dry.     Findings: No rash.  Neurological:     Mental Status: He is alert.      ED Treatments / Results  Labs (all labs ordered are listed, but only abnormal results are displayed) Labs Reviewed - No data to display  EKG None  Radiology No results found.  Procedures Procedures (including critical care time)  Medications Ordered in ED Medications - No data to display    Initial Impression / Assessment and Plan / ED Course  I have reviewed the triage vital signs and the nursing notes.  Pertinent labs & imaging results that were available during my care of the patient were reviewed by me and considered in my medical decision making (see chart for details).        Patient to ED by EMS for c/o foot swelling.   Chart reviewed. History of disability, schizophrenia, has been in the care of his mother at her place of residence. There has been documented concerns regarding whether he is receiving adequate care in this situation. The patient has underlying debilitating psychiatric conditions and is  a questionable historian, focused on that he is not given what he needs for adequate hygiene, "my mother won't given me what I need". However, objectively, he arrives infested with roaches through his clothing and even in his shoes. The mother is commonly unavailable for contact by phone. There is no physical exam findings to suggest physical abuse of the patient.   Clothing removed and bagged. The patient is taken to the shower and cleaned. He is given food and drink. He will need to stay in the emergency department overnight and will need social work intervention, possibly Adult [employer], to place the patient in a safe living environment.   Final Clinical Impressions(s) / ED Diagnoses   Final diagnoses:  None   1. Mental disability 2. Poor hygiene 3. History of schizophrenia  ED Discharge Orders    None       Charlann Lange, PA-C 08/22/18 0548    Merryl Hacker, MD 08/22/18 [phone]

## 2018-08-22 NOTE — ED Notes (Signed)
Spoke with Leeroy Cha, CSW who reports patient is awaiting one equipment for home and then patient can be transported via taxi.

## 2018-08-23 LAB — URINE CULTURE

## 2018-09-13 ENCOUNTER — Other Ambulatory Visit: Payer: Self-pay

## 2018-09-13 ENCOUNTER — Emergency Department (HOSPITAL_COMMUNITY)
Admission: EM | Admit: 2018-09-13 | Discharge: 2018-09-13 | Disposition: A | Discharge status: Home / Self Care | Payer: Medicaid Other | Attending: Emergency Medicine | Admitting: Emergency Medicine

## 2018-09-13 ENCOUNTER — Emergency Department (HOSPITAL_BASED_OUTPATIENT_CLINIC_OR_DEPARTMENT_OTHER): Payer: Medicaid Other

## 2018-09-13 ENCOUNTER — Encounter (HOSPITAL_COMMUNITY): Payer: Self-pay

## 2018-09-13 DIAGNOSIS — F17210 Nicotine dependence, cigarettes, uncomplicated: Secondary | ICD-10-CM | POA: Insufficient documentation

## 2018-09-13 DIAGNOSIS — Z794 Long term (current) use of insulin: Secondary | ICD-10-CM | POA: Diagnosis not present

## 2018-09-13 DIAGNOSIS — R600 Localized edema: Secondary | ICD-10-CM | POA: Diagnosis not present

## 2018-09-13 DIAGNOSIS — E1065 Type 1 diabetes mellitus with hyperglycemia: Secondary | ICD-10-CM | POA: Diagnosis not present

## 2018-09-13 DIAGNOSIS — R739 Hyperglycemia, unspecified: Secondary | ICD-10-CM

## 2018-09-13 DIAGNOSIS — R609 Edema, unspecified: Secondary | ICD-10-CM

## 2018-09-13 DIAGNOSIS — I10 Essential (primary) hypertension: Secondary | ICD-10-CM | POA: Insufficient documentation

## 2018-09-13 DIAGNOSIS — M7989 Other specified soft tissue disorders: Secondary | ICD-10-CM

## 2018-09-13 LAB — CBC WITH DIFFERENTIAL/PLATELET
Abs Immature Granulocytes: 0.05 10*3/uL (ref 0.00–0.07)
Basophils Absolute: 0.1 10*3/uL (ref 0.0–0.1)
Basophils Relative: 1 %
Eosinophils Absolute: 0.1 10*3/uL (ref 0.0–0.5)
Eosinophils Relative: 1 %
HCT: 34.9 % — ABNORMAL LOW (ref 39.0–52.0)
Hemoglobin: 10.9 g/dL — ABNORMAL LOW (ref 13.0–17.0)
Immature Granulocytes: 1 %
Lymphocytes Relative: 19 %
Lymphs Abs: 1.8 10*3/uL (ref 0.7–4.0)
MCH: 26.8 pg (ref 26.0–34.0)
MCHC: 31.2 g/dL (ref 30.0–36.0)
MCV: 86 fL (ref 80.0–100.0)
Monocytes Absolute: 0.9 10*3/uL (ref 0.1–1.0)
Monocytes Relative: 9 %
Neutro Abs: 6.7 10*3/uL (ref 1.7–7.7)
Neutrophils Relative %: 69 %
Platelets: 346 10*3/uL (ref 150–400)
RBC: 4.06 MIL/uL — ABNORMAL LOW (ref 4.22–5.81)
RDW: 14.6 % (ref 11.5–15.5)
WBC: 9.5 10*3/uL (ref 4.0–10.5)
nRBC: 0 % (ref 0.0–0.2)

## 2018-09-13 LAB — BLOOD GAS, VENOUS
Acid-Base Excess: 0.2 mmol/L (ref 0.0–2.0)
Bicarbonate: 26.1 mmol/L (ref 20.0–28.0)
O2 Saturation: 71.7 %
Patient temperature: 98.6
pCO2, Ven: 51.3 mmHg (ref 44.0–60.0)
pH, Ven: 7.327 (ref 7.250–7.430)
pO2, Ven: 41.3 mmHg (ref 32.0–45.0)

## 2018-09-13 LAB — BASIC METABOLIC PANEL
Anion gap: 11 (ref 5–15)
BUN: 27 mg/dL — ABNORMAL HIGH (ref 6–20)
CO2: 25 mmol/L (ref 22–32)
Calcium: 9.4 mg/dL (ref 8.9–10.3)
Chloride: 100 mmol/L (ref 98–111)
Creatinine, Ser: 2.71 mg/dL — ABNORMAL HIGH (ref 0.61–1.24)
GFR calc Af Amer: 29 mL/min — ABNORMAL LOW (ref >60)
GFR calc non Af Amer: 25 mL/min — ABNORMAL LOW (ref >60)
Glucose, Bld: 425 mg/dL — ABNORMAL HIGH (ref 70–99)
Potassium: 4.8 mmol/L (ref 3.5–5.1)
Sodium: 136 mmol/L (ref 135–145)

## 2018-09-13 LAB — CBG MONITORING, ED
Glucose-Capillary: 403 mg/dL — ABNORMAL HIGH (ref 70–99)
Glucose-Capillary: 348 mg/dL — ABNORMAL HIGH (ref 70–99)
Glucose-Capillary: 246 mg/dL — ABNORMAL HIGH (ref 70–99)

## 2018-09-13 MED ORDER — SODIUM CHLORIDE 0.9 % IV SOLN
INTRAVENOUS | Status: DC
  Administered 2018-09-13: 13:00:00 20 mL via INTRAVENOUS

## 2018-09-13 MED ORDER — INSULIN ASPART 100 UNIT/ML ~~LOC~~ SOLN
10.0000 [IU] | Freq: Once | SUBCUTANEOUS | Status: AC
  Administered 2018-09-13: 10 [IU] via SUBCUTANEOUS
  Filled 2018-09-13: qty 0.1

## 2018-09-13 NOTE — ED Notes (Signed)
ED Provider at bedside. 

## 2018-09-13 NOTE — ED Provider Notes (Signed)
[name] DEPT Provider Note   CSN: [id number] Arrival date & time: 09/13/18  1202     History   Chief Complaint Chief Complaint  Patient presents with  . Leg Pain  . Leg Swelling    HPI Timothy Madden is a 58 y.o. male.     58 year old male presents with 2-week history of bilateral lower extremity edema right worse than left.  Denies any chest pain or shortness of breath.  States that he has indwelling Foley catheter for 2 weeks due to urinary retention.  Has had leaking from that bag which is caused him to have increased moisture on his lower extremities.  States that when he went to clean them he noted maggots on his extremities.  He denies any dyspnea on exertion or orthopnea.     Past Medical History:  Diagnosis Date  . Amputation of finger, left   . Complication of anesthesia   . Diabetes mellitus without complication ([place])   . DKA (diabetic ketoacidoses) ([place]) 06/2018  . PONV (postoperative nausea and vomiting)     Patient Active Problem List   Diagnosis Date Noted  . DKA, type 2 ([place]) 06/28/2018  . Acute renal failure superimposed on chronic kidney disease ([place]) 06/28/2018  . Elevated lipase 06/28/2018  . Osteomyelitis of finger of left hand ([place]) 10/26/2013  . Osteomyelitis ([place]) 10/26/2013  . Sepsis ([place]) 10/26/2013  . AKI (acute kidney injury) ([place]) 10/26/2013  . Hyponatremia 10/26/2013  . Type I diabetes mellitus with complication, uncontrolled ([place]) 10/26/2013  . Essential hypertension 10/26/2013  . Anemia of chronic disease 10/26/2013  . PSYCHIATRIC DISORDER 07/19/2006  . TOBACCO ABUSE 07/19/2006  . DIABETES MELLITUS, TYPE II 11/30/2005  . ANXIETY 11/30/2005    Past Surgical History:  Procedure Laterality Date  . I&D EXTREMITY Left 10/26/2013   Procedure: IRRIGATION AND DEBRIDEMENT EXTREMITY,PARTIAL AMPUTATION LEFT INDEX FINGER;  Surgeon: Dayna Barker, MD;  Location: [facility];  Service: Plastics;  Laterality: Left;   . I&D EXTREMITY Left 10/30/2013   Procedure: IRRIGATION AND DEBRIDEMENT AND REVISION AMPUTATION OF LEFT INDEX FINGER;  Surgeon: Dayna Barker, MD;  Location: [facility];  Service: Plastics;  Laterality: Left;        Home Medications    Prior to Admission medications   Medication Sig Start Date End Date Taking? Authorizing Provider  blood glucose meter kit and supplies KIT Dispense based on patient and insurance preference. Use up to four times daily as directed. (FOR ICD-9 250.00, 250.01). 07/02/18   Aline August, MD  cephALEXin (KEFLEX) 500 MG capsule Take 1 capsule (500 mg total) by mouth 3 (three) times daily. Patient not taking: Reported on 08/22/2018 07/19/18   Ward, Delice Bison, DO  insulin aspart (NOVOLOG) 100 UNIT/ML injection Inject 5 Units into the skin 3 (three) times daily with meals. Patient not taking: Reported on 08/22/2018 07/02/18   Aline August, MD  insulin detemir (LEVEMIR) 100 UNIT/ML injection Inject 0.3 mLs (30 Units total) into the skin daily. Patient not taking: Reported on 08/22/2018 07/03/18   Aline August, MD  Insulin Syringe-Needle U-100 (INSULIN SYRINGE .5CC/31GX5/16") 31G X 5/16" 0.5 ML MISC Levemir 30 units daily and NovoLog 5 units 3 times a day with meals 07/02/18   Aline August, MD  ondansetron (ZOFRAN) 4 MG tablet Take 1 tablet (4 mg total) by mouth every 6 (six) hours as needed for nausea. Patient not taking: Reported on 08/22/2018 07/02/18   Aline August, MD  tamsulosin (FLOMAX) 0.4 MG CAPS capsule Take  1 capsule (0.4 mg total) by mouth daily after supper. Patient not taking: Reported on 08/22/2018 07/02/18   Aline August, MD    Family History Family History  Problem Relation Age of Onset  . Hypertension Mother   . Diabetes Father   . Diabetes Sister   . Diabetes Brother   . Heart disease Brother     Social History Social History   Tobacco Use  . Smoking status: Current Every Day Smoker    Packs/day: 1.00    Years: 20.00    Pack years: 20.00     Types: Cigarettes  . Smokeless tobacco: Never Used  Substance Use Topics  . Alcohol use: No    Alcohol/week: 0.0 standard drinks  . Drug use: No     Allergies   Patient has no known allergies.   Review of Systems Review of Systems  All other systems reviewed and are negative.    Physical Exam Updated Vital Signs BP (!) 141/96   Pulse 94   Temp 97.9 F (36.6 C) (Oral)   Resp 18   Ht 1.93 m ('6\' 4"' )   Wt 88.5 kg   SpO2 100%   BMI 23.74 kg/m   Physical Exam Vitals signs and nursing note reviewed.  Constitutional:      General: He is not in acute distress.    Appearance: Normal appearance. He is well-developed. He is not toxic-appearing.  HENT:     Head: Normocephalic and atraumatic.  Eyes:     General: Lids are normal.     Conjunctiva/sclera: Conjunctivae normal.     Pupils: Pupils are equal, round, and reactive to light.  Neck:     Musculoskeletal: Normal range of motion and neck supple.     Thyroid: No thyroid mass.     Trachea: No tracheal deviation.  Cardiovascular:     Rate and Rhythm: Normal rate and regular rhythm.     Heart sounds: Normal heart sounds. No murmur. No gallop.   Pulmonary:     Effort: Pulmonary effort is normal. No respiratory distress.     Breath sounds: Normal breath sounds. No stridor. No decreased breath sounds, wheezing, rhonchi or rales.  Abdominal:     General: Bowel sounds are normal. There is no distension.     Palpations: Abdomen is soft.     Tenderness: There is no abdominal tenderness. There is no rebound.  Musculoskeletal: Normal range of motion.        General: No tenderness.  Lymphadenopathy:     Comments: Bilateral lower extremity edema right worse than left.  Multiple healing wounds noted on both extremities.  Neurovascular intact at both feet.  Skin:    General: Skin is warm and dry.     Findings: No abrasion or rash.  Neurological:     Mental Status: He is alert and oriented to person, place, and time.     GCS:  GCS eye subscore is 4. GCS verbal subscore is 5. GCS motor subscore is 6.     Cranial Nerves: No cranial nerve deficit.     Sensory: No sensory deficit.  Psychiatric:        Speech: Speech normal.        Behavior: Behavior normal.      ED Treatments / Results  Labs (all labs ordered are listed, but only abnormal results are displayed) Labs Reviewed  CBG MONITORING, ED - Abnormal; Notable for the following components:      Result Value   Glucose-Capillary 403 (*)  All other components within normal limits  CBC WITH DIFFERENTIAL/PLATELET  BASIC METABOLIC PANEL  URINALYSIS, ROUTINE W REFLEX MICROSCOPIC  BLOOD GAS, VENOUS    EKG None  Radiology No results found.  Procedures Procedures (including critical care time)  Medications Ordered in ED Medications  0.9 %  sodium chloride infusion (has no administration in time range)     Initial Impression / Assessment and Plan / ED Course  I have reviewed the triage vital signs and the nursing notes.  Pertinent labs & imaging results that were available during my care of the patient were reviewed by me and considered in my medical decision making (see chart for details).        Patient had negative Doppler ultrasound of his right leg.  Renal function is around his baseline.  Had mild hyperglycemia that was treated with insulin and repeat shows good response.  No evidence of DKA.  No evidence of insect infestation noted on exam.  Final Clinical Impressions(s) / ED Diagnoses   Final diagnoses:  None    ED Discharge Orders    None       Lacretia Leigh, MD 09/13/18 1530

## 2018-09-13 NOTE — ED Notes (Signed)
Pt. Aware of urine specimen. Will collect urine when pt. Void. Nurse aware.

## 2018-09-13 NOTE — Discharge Instructions (Signed)
Follow-up with your doctor for your blood sugar management

## 2018-09-13 NOTE — Progress Notes (Signed)
Lower extremity venous has been completed.   Preliminary results in CV Proc.   [name] 09/13/2018 1:28 PM

## 2018-09-13 NOTE — ED Triage Notes (Signed)
Per EMS- Patient is from home where he lives with his elderly mother. Patient c/o bilateral leg pain and swelling. R>L. EMS- states weeping noted from the right leg. Patient is diabetic and is non compliant with his Metformin due to cost.  EMS states that the patient reported that he has a bladder infection. Patient has an indwelling foley cath and there was no cap and urine was running on the patient's lower extremities.

## 2018-11-10 ENCOUNTER — Inpatient Hospital Stay (HOSPITAL_COMMUNITY)
Admission: EM | Admit: 2018-11-10 | Discharge: 2018-11-15 | DRG: 300 | Disposition: A | Discharge status: Home / Self Care | Payer: Medicaid Other | Attending: Internal Medicine | Admitting: Internal Medicine

## 2018-11-10 ENCOUNTER — Other Ambulatory Visit: Payer: Self-pay

## 2018-11-10 ENCOUNTER — Inpatient Hospital Stay (HOSPITAL_COMMUNITY): Payer: Medicaid Other

## 2018-11-10 ENCOUNTER — Emergency Department (HOSPITAL_COMMUNITY): Payer: Medicaid Other

## 2018-11-10 DIAGNOSIS — Z794 Long term (current) use of insulin: Secondary | ICD-10-CM | POA: Diagnosis not present

## 2018-11-10 DIAGNOSIS — Z9114 Patient's other noncompliance with medication regimen: Secondary | ICD-10-CM

## 2018-11-10 DIAGNOSIS — N136 Pyonephrosis: Secondary | ICD-10-CM | POA: Diagnosis present

## 2018-11-10 DIAGNOSIS — Z87440 Personal history of urinary (tract) infections: Secondary | ICD-10-CM | POA: Diagnosis not present

## 2018-11-10 DIAGNOSIS — N3000 Acute cystitis without hematuria: Secondary | ICD-10-CM

## 2018-11-10 DIAGNOSIS — R4589 Other symptoms and signs involving emotional state: Secondary | ICD-10-CM | POA: Diagnosis present

## 2018-11-10 DIAGNOSIS — N183 Chronic kidney disease, stage 3 unspecified: Secondary | ICD-10-CM | POA: Diagnosis present

## 2018-11-10 DIAGNOSIS — Z833 Family history of diabetes mellitus: Secondary | ICD-10-CM | POA: Diagnosis not present

## 2018-11-10 DIAGNOSIS — M79661 Pain in right lower leg: Secondary | ICD-10-CM

## 2018-11-10 DIAGNOSIS — E1122 Type 2 diabetes mellitus with diabetic chronic kidney disease: Secondary | ICD-10-CM | POA: Diagnosis present

## 2018-11-10 DIAGNOSIS — I82431 Acute embolism and thrombosis of right popliteal vein: Principal | ICD-10-CM | POA: Diagnosis present

## 2018-11-10 DIAGNOSIS — Z20828 Contact with and (suspected) exposure to other viral communicable diseases: Secondary | ICD-10-CM | POA: Diagnosis present

## 2018-11-10 DIAGNOSIS — Z590 Homelessness: Secondary | ICD-10-CM | POA: Diagnosis not present

## 2018-11-10 DIAGNOSIS — F329 Major depressive disorder, single episode, unspecified: Secondary | ICD-10-CM | POA: Diagnosis present

## 2018-11-10 DIAGNOSIS — F430 Acute stress reaction: Secondary | ICD-10-CM | POA: Diagnosis present

## 2018-11-10 DIAGNOSIS — M7989 Other specified soft tissue disorders: Secondary | ICD-10-CM

## 2018-11-10 DIAGNOSIS — Z23 Encounter for immunization: Secondary | ICD-10-CM

## 2018-11-10 DIAGNOSIS — I82441 Acute embolism and thrombosis of right tibial vein: Secondary | ICD-10-CM | POA: Diagnosis present

## 2018-11-10 DIAGNOSIS — E1165 Type 2 diabetes mellitus with hyperglycemia: Secondary | ICD-10-CM | POA: Diagnosis present

## 2018-11-10 DIAGNOSIS — L89899 Pressure ulcer of other site, unspecified stage: Secondary | ICD-10-CM | POA: Diagnosis present

## 2018-11-10 DIAGNOSIS — F17210 Nicotine dependence, cigarettes, uncomplicated: Secondary | ICD-10-CM | POA: Diagnosis present

## 2018-11-10 LAB — BASIC METABOLIC PANEL
Anion gap: 8 (ref 5–15)
BUN: 30 mg/dL — ABNORMAL HIGH (ref 6–20)
CO2: 23 mmol/L (ref 22–32)
Calcium: 8.6 mg/dL — ABNORMAL LOW (ref 8.9–10.3)
Chloride: 102 mmol/L (ref 98–111)
Creatinine, Ser: 2.53 mg/dL — ABNORMAL HIGH (ref 0.61–1.24)
GFR calc Af Amer: 31 mL/min — ABNORMAL LOW (ref >60)
GFR calc non Af Amer: 27 mL/min — ABNORMAL LOW (ref >60)
Glucose, Bld: 389 mg/dL — ABNORMAL HIGH (ref 70–99)
Potassium: 4.7 mmol/L (ref 3.5–5.1)
Sodium: 133 mmol/L — ABNORMAL LOW (ref 135–145)

## 2018-11-10 LAB — CBC
HCT: 36 % — ABNORMAL LOW (ref 39.0–52.0)
Hemoglobin: 11.3 g/dL — ABNORMAL LOW (ref 13.0–17.0)
MCH: 26.4 pg (ref 26.0–34.0)
MCHC: 31.4 g/dL (ref 30.0–36.0)
MCV: 84.1 fL (ref 80.0–100.0)
Platelets: 357 10*3/uL (ref 150–400)
RBC: 4.28 MIL/uL (ref 4.22–5.81)
RDW: 14 % (ref 11.5–15.5)
WBC: 14.1 10*3/uL — ABNORMAL HIGH (ref 4.0–10.5)
nRBC: 0 % (ref 0.0–0.2)

## 2018-11-10 LAB — CREATININE, SERUM
Creatinine, Ser: 2.56 mg/dL — ABNORMAL HIGH (ref 0.61–1.24)
GFR calc Af Amer: 31 mL/min — ABNORMAL LOW (ref >60)
GFR calc non Af Amer: 27 mL/min — ABNORMAL LOW (ref >60)

## 2018-11-10 LAB — SARS CORONAVIRUS 2 (TAT 6-24 HRS): SARS Coronavirus 2: NEGATIVE

## 2018-11-10 LAB — CBC WITH DIFFERENTIAL/PLATELET
Abs Immature Granulocytes: 0.13 10*3/uL — ABNORMAL HIGH (ref 0.00–0.07)
Basophils Absolute: 0.1 10*3/uL (ref 0.0–0.1)
Basophils Relative: 1 %
Eosinophils Absolute: 0.1 10*3/uL (ref 0.0–0.5)
Eosinophils Relative: 1 %
HCT: 40.7 % (ref 39.0–52.0)
Hemoglobin: 12.7 g/dL — ABNORMAL LOW (ref 13.0–17.0)
Immature Granulocytes: 1 %
Lymphocytes Relative: 18 %
Lymphs Abs: 2.4 10*3/uL (ref 0.7–4.0)
MCH: 26.5 pg (ref 26.0–34.0)
MCHC: 31.2 g/dL (ref 30.0–36.0)
MCV: 84.8 fL (ref 80.0–100.0)
Monocytes Absolute: 1.1 10*3/uL — ABNORMAL HIGH (ref 0.1–1.0)
Monocytes Relative: 8 %
Neutro Abs: 9.2 10*3/uL — ABNORMAL HIGH (ref 1.7–7.7)
Neutrophils Relative %: 71 %
Platelets: 331 10*3/uL (ref 150–400)
RBC: 4.8 MIL/uL (ref 4.22–5.81)
RDW: 14 % (ref 11.5–15.5)
WBC: 13 10*3/uL — ABNORMAL HIGH (ref 4.0–10.5)
nRBC: 0 % (ref 0.0–0.2)

## 2018-11-10 LAB — COMPREHENSIVE METABOLIC PANEL
ALT: 8 U/L (ref 0–44)
AST: 11 U/L — ABNORMAL LOW (ref 15–41)
Albumin: 3.3 g/dL — ABNORMAL LOW (ref 3.5–5.0)
Alkaline Phosphatase: 121 U/L (ref 38–126)
Anion gap: 10 (ref 5–15)
BUN: 34 mg/dL — ABNORMAL HIGH (ref 6–20)
CO2: 23 mmol/L (ref 22–32)
Calcium: 9.3 mg/dL (ref 8.9–10.3)
Chloride: 98 mmol/L (ref 98–111)
Creatinine, Ser: 2.76 mg/dL — ABNORMAL HIGH (ref 0.61–1.24)
GFR calc Af Amer: 28 mL/min — ABNORMAL LOW (ref >60)
GFR calc non Af Amer: 24 mL/min — ABNORMAL LOW (ref >60)
Glucose, Bld: 465 mg/dL — ABNORMAL HIGH (ref 70–99)
Potassium: 4.3 mmol/L (ref 3.5–5.1)
Sodium: 131 mmol/L — ABNORMAL LOW (ref 135–145)
Total Bilirubin: 0.5 mg/dL (ref 0.3–1.2)
Total Protein: 8.5 g/dL — ABNORMAL HIGH (ref 6.5–8.1)

## 2018-11-10 LAB — CBG MONITORING, ED
Glucose-Capillary: 492 mg/dL — ABNORMAL HIGH (ref 70–99)
Glucose-Capillary: 409 mg/dL — ABNORMAL HIGH (ref 70–99)
Glucose-Capillary: 267 mg/dL — ABNORMAL HIGH (ref 70–99)

## 2018-11-10 LAB — URINALYSIS, ROUTINE W REFLEX MICROSCOPIC
Bilirubin Urine: NEGATIVE
Glucose, UA: >=500 mg/dL — AB
Ketones, ur: NEGATIVE mg/dL
Nitrite: NEGATIVE
Protein, ur: 30 mg/dL — AB
Specific Gravity, Urine: 1.016 (ref 1.005–1.030)
WBC, UA: >50 WBC/hpf — ABNORMAL HIGH (ref 0–5)
pH: 6 (ref 5.0–8.0)

## 2018-11-10 LAB — LACTIC ACID, PLASMA
Lactic Acid, Venous: 1 mmol/L (ref 0.5–1.9)
Lactic Acid, Venous: 1 mmol/L (ref 0.5–1.9)

## 2018-11-10 LAB — GLUCOSE, CAPILLARY: Glucose-Capillary: 202 mg/dL — ABNORMAL HIGH (ref 70–99)

## 2018-11-10 LAB — D-DIMER, QUANTITATIVE: D-Dimer, Quant: 3.42 ug/mL-FEU — ABNORMAL HIGH (ref 0.00–0.50)

## 2018-11-10 LAB — HEMOGLOBIN A1C
Hgb A1c MFr Bld: 14.3 % — ABNORMAL HIGH (ref 4.8–5.6)
Mean Plasma Glucose: 363.71 mg/dL

## 2018-11-10 MED ORDER — ENOXAPARIN SODIUM 100 MG/ML ~~LOC~~ SOLN
90.0000 mg | Freq: Two times a day (BID) | SUBCUTANEOUS | Status: DC
  Administered 2018-11-10 – 2018-11-13 (×6): 90 mg via SUBCUTANEOUS
  Filled 2018-11-10 (×5): qty 1
  Filled 2018-11-10: qty 0.9

## 2018-11-10 MED ORDER — ACETAMINOPHEN 325 MG PO TABS: 650.0000 mg | ORAL_TABLET | Freq: Four times a day (QID) | ORAL | Status: DC | PRN

## 2018-11-10 MED ORDER — TAMSULOSIN HCL 0.4 MG PO CAPS
0.4000 mg | ORAL_CAPSULE | Freq: Two times a day (BID) | ORAL | Status: DC
  Administered 2018-11-10 – 2018-11-15 (×11): 0.4 mg via ORAL
  Filled 2018-11-10 (×11): qty 1

## 2018-11-10 MED ORDER — SODIUM CHLORIDE 0.9 % IV BOLUS
1000.0000 mL | Freq: Once | INTRAVENOUS | Status: AC
  Administered 2018-11-10: 06:00:00 1000 mL via INTRAVENOUS

## 2018-11-10 MED ORDER — INSULIN ASPART 100 UNIT/ML ~~LOC~~ SOLN
0.0000 [IU] | Freq: Three times a day (TID) | SUBCUTANEOUS | Status: DC
  Administered 2018-11-10: 9 [IU] via SUBCUTANEOUS
  Administered 2018-11-10: 16:00:00 5 [IU] via SUBCUTANEOUS
  Administered 2018-11-11: 14:00:00 3 [IU] via SUBCUTANEOUS
  Administered 2018-11-12: 1 [IU] via SUBCUTANEOUS
  Administered 2018-11-12: 3 [IU] via SUBCUTANEOUS
  Administered 2018-11-13: 13:00:00 1 [IU] via SUBCUTANEOUS
  Administered 2018-11-13: 2 [IU] via SUBCUTANEOUS
  Administered 2018-11-13: 1 [IU] via SUBCUTANEOUS
  Administered 2018-11-14: 2 [IU] via SUBCUTANEOUS
  Administered 2018-11-14: 7 [IU] via SUBCUTANEOUS
  Administered 2018-11-14: 09:00:00 2 [IU] via SUBCUTANEOUS
  Administered 2018-11-15: 09:00:00 1 [IU] via SUBCUTANEOUS
  Administered 2018-11-15: 5 [IU] via SUBCUTANEOUS
  Filled 2018-11-10: qty 0.09

## 2018-11-10 MED ORDER — ACETAMINOPHEN 650 MG RE SUPP: 650.0000 mg | Freq: Four times a day (QID) | RECTAL | Status: DC | PRN

## 2018-11-10 MED ORDER — ONDANSETRON HCL 4 MG/2ML IJ SOLN: 4.0000 mg | Freq: Four times a day (QID) | INTRAMUSCULAR | Status: DC | PRN

## 2018-11-10 MED ORDER — INSULIN DETEMIR 100 UNIT/ML ~~LOC~~ SOLN
30.0000 [IU] | Freq: Every day | SUBCUTANEOUS | Status: DC
  Administered 2018-11-10 – 2018-11-13 (×4): 30 [IU] via SUBCUTANEOUS
  Filled 2018-11-10 (×4): qty 0.3

## 2018-11-10 MED ORDER — SODIUM CHLORIDE 0.9 % IV SOLN
1.0000 g | Freq: Once | INTRAVENOUS | Status: AC
  Administered 2018-11-10: 08:00:00 1 g via INTRAVENOUS
  Filled 2018-11-10: qty 10

## 2018-11-10 MED ORDER — SODIUM CHLORIDE 0.9 % IV BOLUS
1000.0000 mL | Freq: Once | INTRAVENOUS | Status: AC
  Administered 2018-11-10: 1000 mL via INTRAVENOUS

## 2018-11-10 MED ORDER — ENOXAPARIN SODIUM 100 MG/ML ~~LOC~~ SOLN
90.0000 mg | Freq: Once | SUBCUTANEOUS | Status: AC
  Administered 2018-11-10: 10:00:00 90 mg via SUBCUTANEOUS
  Filled 2018-11-10: qty 0.9

## 2018-11-10 MED ORDER — SODIUM CHLORIDE 0.9 % IV SOLN
1.0000 g | INTRAVENOUS | Status: DC
  Administered 2018-11-11 – 2018-11-12 (×2): 1 g via INTRAVENOUS
  Filled 2018-11-10: qty 10
  Filled 2018-11-10 (×2): qty 1

## 2018-11-10 MED ORDER — CHLORHEXIDINE GLUCONATE CLOTH 2 % EX PADS
6.0000 | MEDICATED_PAD | Freq: Every day | CUTANEOUS | Status: DC
  Administered 2018-11-10 – 2018-11-13 (×3): 6 via TOPICAL

## 2018-11-10 MED ORDER — ONDANSETRON HCL 4 MG PO TABS
4.0000 mg | ORAL_TABLET | Freq: Four times a day (QID) | ORAL | Status: DC | PRN
  Filled 2018-11-10: qty 1

## 2018-11-10 NOTE — Progress Notes (Signed)
RLE venous duplex       has been completed. Preliminary results can be found under CV proc through chart review. June Leap, BS, RDMS, RVT    Positive results called to ED provider.

## 2018-11-10 NOTE — ED Triage Notes (Signed)
Arrives via EMS from home. Around 2 days ago he says his catheter started leaking, also noticed swelling and pain in bilateral feet. Seen about 2 weeks ago for a wound on his upper thigh. Patient reports he has not taken his diabetes medication, CBG 394. Patient ambulatory upon arrival.

## 2018-11-10 NOTE — ED Provider Notes (Signed)
[facility] DEPT Provider Note   CSN: [id number] Arrival date & time: 11/10/18  0009     History   Chief Complaint Chief Complaint  Patient presents with   leaking leg bag    HPI Timothy Madden is a 58 y.o. male with a history of diabetes mellitus type 2, osteomyelitis, chronic kidney disease who presents to the emergency department with a chief complaint of leaking catheter.  The patient reports that his leg bag began leaking about 2 days ago.  He reports that the Foley has been in place for at least a month.  He reports that it was placed in the ER during a previous visit.  Reports he was supposed to follow-up with urology, but called the office and got a voicemail.  He has not been seen by urology in the office.  He reports that he cleans it regularly with Germ-X.   The patient reports that the Foley has been in place since he was seen in the ER.  He does not originally remember when it was placed.  He was supposed to follow-up with urology, but has not.  He states he called the office once and got a voicemail but has never been seen.  He also reports that for the last 2 days that he has had pain and swelling in his right lower leg.  He reports that about a week ago that he developed a wound to the right lower leg.  Pain has been worsening since onset.  He reports that he also had some sores on his left lower leg for some time.  Per chart review, he did have Foley catheter in place during an ER visit on August 12.  He was seen in the ER on July 8 for urinary retention and had a Foley catheter placed at that time.  He also reports that he has been out of his diabetes medications for the last week.  He reports that his brother owns a barbershop and has offered to fill them for him, but when he took them to the pharmacy he did not have any refills left.  He reports that he previously had his prescriptions filled by Haynes Dage at the [facility], but has not seen her  in more than a month.  He is unsure if he has any other medical providers at this time.  He reports that he currently lives with his mother.  He states that he feels like he needs more help with his medical issues than what he is currently receiving at home.  He states "I am young.  I have to be better for my kids."  Per chart review, when the patient was seen in the ER on August 12, he was supposedly covered in [place] after arriving from home.  He denies recent fevers, chills, shortness of breath, cough, abdominal pain, nausea, vomiting, diarrhea, dysuria, hematuria, numbness, weakness, dizziness, or lightheadedness.     The history is provided by the patient. No language interpreter was used.    Past Medical History:  Diagnosis Date   Amputation of finger, left    Complication of anesthesia    Diabetes mellitus without complication ([place])    DKA (diabetic ketoacidoses) ([place]) 06/2018   PONV (postoperative nausea and vomiting)     Patient Active Problem List   Diagnosis Date Noted   Leg swelling 11/10/2018   DKA, type 2 ([place]) 06/28/2018   Acute renal failure superimposed on chronic kidney disease ([place]) 06/28/2018  Elevated lipase 06/28/2018   Osteomyelitis of finger of left hand ([place]) 10/26/2013   Osteomyelitis ([place]) 10/26/2013   Sepsis ([place]) 10/26/2013   AKI (acute kidney injury) ([place]) 10/26/2013   Hyponatremia 10/26/2013   Type I diabetes mellitus with complication, uncontrolled ([place]) 10/26/2013   Essential hypertension 10/26/2013   Anemia of chronic disease 10/26/2013   PSYCHIATRIC DISORDER 07/19/2006   TOBACCO ABUSE 07/19/2006   DIABETES MELLITUS, TYPE II 11/30/2005   ANXIETY 11/30/2005    Past Surgical History:  Procedure Laterality Date   I&D EXTREMITY Left 10/26/2013   Procedure: IRRIGATION AND DEBRIDEMENT EXTREMITY,PARTIAL AMPUTATION LEFT INDEX FINGER;  Surgeon: Dayna Barker, MD;  Location: [facility];  Service: Plastics;  Laterality: Left;    I&D EXTREMITY Left 10/30/2013   Procedure: IRRIGATION AND DEBRIDEMENT AND REVISION AMPUTATION OF LEFT INDEX FINGER;  Surgeon: Dayna Barker, MD;  Location: [facility];  Service: Plastics;  Laterality: Left;        Home Medications    Prior to Admission medications   Medication Sig Start Date End Date Taking? Authorizing Provider  blood glucose meter kit and supplies KIT Dispense based on patient and insurance preference. Use up to four times daily as directed. (FOR ICD-9 250.00, 250.01). 07/02/18   Aline August, MD  cephALEXin (KEFLEX) 500 MG capsule Take 1 capsule (500 mg total) by mouth 3 (three) times daily. Patient not taking: Reported on 08/22/2018 07/19/18   Ward, Delice Bison, DO  insulin aspart (NOVOLOG) 100 UNIT/ML injection Inject 5 Units into the skin 3 (three) times daily with meals. Patient not taking: Reported on 08/22/2018 07/02/18   Aline August, MD  insulin detemir (LEVEMIR) 100 UNIT/ML injection Inject 0.3 mLs (30 Units total) into the skin daily. Patient not taking: Reported on 08/22/2018 07/03/18   Aline August, MD  Insulin Syringe-Needle U-100 (INSULIN SYRINGE .5CC/31GX5/16") 31G X 5/16" 0.5 ML MISC Levemir 30 units daily and NovoLog 5 units 3 times a day with meals 07/02/18   Aline August, MD  ondansetron (ZOFRAN) 4 MG tablet Take 1 tablet (4 mg total) by mouth every 6 (six) hours as needed for nausea. Patient not taking: Reported on 08/22/2018 07/02/18   Aline August, MD  tamsulosin (FLOMAX) 0.4 MG CAPS capsule Take 1 capsule (0.4 mg total) by mouth daily after supper. Patient not taking: Reported on 08/22/2018 07/02/18   Aline August, MD    Family History Family History  Problem Relation Age of Onset   Hypertension Mother    Diabetes Father    Diabetes Sister    Diabetes Brother    Heart disease Brother     Social History Social History   Tobacco Use   Smoking status: Current Every Day Smoker    Packs/day: 1.00    Years: 20.00    Pack years: 20.00     Types: Cigarettes   Smokeless tobacco: Never Used  Substance Use Topics   Alcohol use: No    Alcohol/week: 0.0 standard drinks   Drug use: No     Allergies   Patient has no known allergies.   Review of Systems Review of Systems  Constitutional: Negative for appetite change, chills, diaphoresis and fever.  HENT: Negative for congestion, sore throat and voice change.   Respiratory: Negative for shortness of breath and wheezing.   Cardiovascular: Positive for leg swelling. Negative for chest pain and palpitations.  Gastrointestinal: Negative for abdominal pain, anal bleeding, blood in stool, diarrhea, nausea and vomiting.  Genitourinary: Negative for dysuria, flank pain, frequency, genital sores, penile  pain and penile swelling.  Musculoskeletal: Positive for arthralgias and myalgias. Negative for back pain, gait problem, joint swelling, neck pain and neck stiffness.  Skin: Positive for wound. Negative for rash.  Allergic/Immunologic: Negative for immunocompromised state.  Neurological: Negative for seizures, syncope, weakness, numbness and headaches.  Psychiatric/Behavioral: Negative for confusion, dysphoric mood, hallucinations and self-injury.     Physical Exam Updated Vital Signs BP 127/84    Pulse 88    Temp 98.6 F (37 C) (Oral)    Resp 18    Ht _0  (1.905 m)    Wt 88.5 kg    SpO2 100%    BMI 24.37 kg/m   Physical Exam Vitals signs and nursing note reviewed.  Constitutional:      General: He is not in acute distress.    Appearance: He is well-developed. He is not ill-appearing, toxic-appearing or diaphoretic.     Comments: Leg bag in place to the right thigh.  Cloudy, purulent looking urine is noted in the bag.  HENT:     Head: Normocephalic.  Eyes:     Conjunctiva/sclera: Conjunctivae normal.  Neck:     Musculoskeletal: Neck supple.  Cardiovascular:     Rate and Rhythm: Normal rate and regular rhythm.     Pulses: Normal pulses.     Heart sounds: Normal heart  sounds. No murmur. No friction rub. No gallop.   Pulmonary:     Effort: Pulmonary effort is normal. No respiratory distress.     Breath sounds: Normal breath sounds. No stridor. No wheezing, rhonchi or rales.  Chest:     Chest wall: No tenderness.  Abdominal:     General: There is no distension.     Palpations: Abdomen is soft. There is no mass.     Tenderness: There is no abdominal tenderness. There is no right CVA tenderness, left CVA tenderness, guarding or rebound.     Hernia: No hernia is present.  Musculoskeletal:     Right lower leg: Edema present.     Left lower leg: No edema.     Comments: Right calf is swollen, slightly warm to the touch.  No focal calf tenderness to palpation.  There is a fluctuant, purulent mass noted to the medial aspect of the superior portion of the lower leg.  No active drainage.  No red streaking.  5-5 strength against resistance of the bilateral lower extremities.  DP pulses are 2+ and symmetric.  Sensation is intact and equal.  He has some well-healing lesions noted to the left leg and foot.  No evidence of abscess or cellulitis to the left leg.  Skin:    General: Skin is warm and dry.  Neurological:     Mental Status: He is alert.  Psychiatric:        Behavior: Behavior normal.    Right lower leg     ED Treatments / Results  Labs (all labs ordered are listed, but only abnormal results are displayed) Labs Reviewed  URINALYSIS, ROUTINE W REFLEX MICROSCOPIC - Abnormal; Notable for the following components:      Result Value   APPearance HAZY (*)    Glucose, UA >=500 (*)    Hgb urine dipstick MODERATE (*)    Protein, ur 30 (*)    Leukocytes,Ua LARGE (*)    WBC, UA >50 (*)    Bacteria, UA FEW (*)    All other components within normal limits  CBC WITH DIFFERENTIAL/PLATELET - Abnormal; Notable for the following components:  WBC 13.0 (*)    Hemoglobin 12.7 (*)    Neutro Abs 9.2 (*)    Monocytes Absolute 1.1 (*)    Abs Immature Granulocytes  0.13 (*)    All other components within normal limits  COMPREHENSIVE METABOLIC PANEL - Abnormal; Notable for the following components:   Sodium 131 (*)    Glucose, Bld 465 (*)    BUN 34 (*)    Creatinine, Ser 2.76 (*)    Total Protein 8.5 (*)    Albumin 3.3 (*)    AST 11 (*)    GFR calc non Af Amer 24 (*)    GFR calc Af Amer 28 (*)    All other components within normal limits  D-DIMER, QUANTITATIVE (NOT AT [facility]) - Abnormal; Notable for the following components:   D-Dimer, Quant 3.42 (*)    All other components within normal limits  CBG MONITORING, ED - Abnormal; Notable for the following components:   Glucose-Capillary 492 (*)    All other components within normal limits  URINE CULTURE  SARS CORONAVIRUS 2 (TAT 6-24 HRS)  LACTIC ACID, PLASMA  LACTIC ACID, PLASMA    EKG None  Radiology Vas [place] Lower Extremity Venous (dvt) (only [facility])  Result Date: 11/10/2018  Lower Venous Study Indications: Swelling.  Limitations: Bandages/ appliances. Comparison Study: 09/13/18 negative Performing Technologist: June Leap RDMS, RVT  Examination Guidelines: A complete evaluation includes B-mode imaging, spectral Doppler, color Doppler, and power Doppler as needed of all accessible portions of each vessel. Bilateral testing is considered an integral part of a complete examination. Limited examinations for reoccurring indications may be performed as noted.  +---------+---------------+---------+-----------+----------+--------------+  RIGHT     Compressibility Phasicity Spontaneity Properties Thrombus Aging  +---------+---------------+---------+-----------+----------+--------------+  CFV       Full            Yes       Yes                                    +---------+---------------+---------+-----------+----------+--------------+  SFJ       Full                                                             +---------+---------------+---------+-----------+----------+--------------+  FV Prox   Full                                                              +---------+---------------+---------+-----------+----------+--------------+  FV Mid    Full                                                             +---------+---------------+---------+-----------+----------+--------------+  FV Distal Full                                                             +---------+---------------+---------+-----------+----------+--------------+  PFV       Full                                                             +---------+---------------+---------+-----------+----------+--------------+  POP       None            No        Yes                    Acute           +---------+---------------+---------+-----------+----------+--------------+  PTV       None                                             Acute           +---------+---------------+---------+-----------+----------+--------------+  PERO      Full                                                             +---------+---------------+---------+-----------+----------+--------------+   +----+---------------+---------+-----------+----------+--------------+  LEFT Compressibility Phasicity Spontaneity Properties                 +----+---------------+---------+-----------+----------+--------------+  CFV                                                   Not visualized  +----+---------------+---------+-----------+----------+--------------+     Summary: Right: Findings consistent with acute deep vein thrombosis involving the right popliteal vein, and right posterior tibial veins. No cystic structure found in the popliteal fossa.  *See table(s) above for measurements and observations.    Preliminary     Procedures Procedures (including critical care time)  Medications Ordered in ED Medications  sodium chloride 0.9 % bolus 1,000 mL (0 mLs Intravenous Stopped 11/10/18 0703)  sodium chloride 0.9 % bolus 1,000 mL (1,000 mLs Intravenous New Bag/Given 11/10/18 0811)  cefTRIAXone  (ROCEPHIN) 1 g in sodium chloride 0.9 % 100 mL IVPB (1 g Intravenous New Bag/Given 11/10/18 7588)     Initial Impression / Assessment and Plan / ED Course  I have reviewed the triage vital signs and the nursing notes.  Pertinent labs & imaging results that were available during my care of the patient were reviewed by me and considered in my medical decision making (see chart for details).        58 year old male with a history of diabetes mellitus type 2, osteomyelitis, chronic kidney disease initially presenting with a complaint of a leaking leg bag from his Foley catheter.  The patient is unable to tell me how long the Foley catheter has been in place.  He has never followed up with urology in the outpatient setting.  Per chart review, it is at least been in place since likely June 2020 when he was discharged from the hospital.  It is unclear Foley  catheter is still required at this time as he has never had urology follow-up.  He is also hyperglycemic to 459 in the ER.  Bicarb and anion gap are normal.  He was treated with IV fluids.  Lactate is normal.  Creatinine is elevated, but unchanged from previous.  He does have a leukocytosis of 13.  Urine does appear infectious.  Urine culture sent.  IV Rocephin ordered based on previous urine cultures.  The patient also has swelling to the right lower leg.  There is a fluctuant mass noted to the proximal portion of the lower leg.  There is no active drainage.  Will order venous duplex of the right lower extremity.  He has no shortness of breath or chest pain to suggest PE at this time.  DVT study is pending.  There is also concern about the patient's ability to care for himself.  It sounds as if he has being seen by the nurse practitioner at the [facility], which is typically reserved for individual suffering from homelessness.  He is unable to identify any other primary care provider and is out of all of his home medications.  Per  chart review, previous adult protective service complaints have been filed.  The patient was discussed with Dr. Rolland Porter, attending physician, who has independently seen and evaluated the patient. Consulted the hospitalist team and Dr. Darrick Meigs will admit. The patient appears reasonably stabilized for admission considering the current resources, flow, and capabilities available in the ED at this time, and I doubt any other [facility] requiring further screening and/or treatment in the ED prior to admission.  Final Clinical Impressions(s) / ED Diagnoses   Final diagnoses:  Acute cystitis without hematuria  Hyperglycemia due to diabetes mellitus (HCC)  Pain and swelling of right lower leg    ED Discharge Orders    None       Joanne Gavel, PA-C 11/10/18 5183    Rolland Porter, MD 11/11/18 [phone]

## 2018-11-10 NOTE — ED Notes (Signed)
Patient transported to CT 

## 2018-11-10 NOTE — H&P (Signed)
TRH H&P    Patient Demographics:    Timothy Madden, is a 58 y.o. male  MRN: [id number]  DOB - [birth year]  Admit Date - 11/10/2018  Referring MD/NP/PA: Dr. Tomi Bamberger  Outpatient Primary MD for the patient is Placey, Audrea Muscat, NP  Patient coming from: Home  Chief complaint-right leg swelling   HPI:    Timothy Madden  is a 58 y.o. male, with history of diabetes mellitus type 2, CKD stage III, osteomyelitis who came to ED with complaint of right leg swelling, leaking Foley catheter.  Patient reports that he has leg bag leaking about 2 days ago and Foley was placed l in the ED on July 8 for urinary retention   He was supposed to follow-up with urology but could not follow-up.  Patient says that his left leg swelling has become worse over past 2 days.  Patient also has been out of his medications for past 1 week. He denies chest pain or shortness of breath. Denies nausea vomiting or diarrhea. Denies fever or chills. No previous history of stroke or seizures. Denies abdominal pain    Review of systems:    In addition to the HPI above,   All other systems reviewed and are negative.    Past History of the following :    Past Medical History:  Diagnosis Date  . Amputation of finger, left   . Complication of anesthesia   . Diabetes mellitus without complication ([place])   . DKA (diabetic ketoacidoses) ([place]) 06/2018  . PONV (postoperative nausea and vomiting)       Past Surgical History:  Procedure Laterality Date  . I&D EXTREMITY Left 10/26/2013   Procedure: IRRIGATION AND DEBRIDEMENT EXTREMITY,PARTIAL AMPUTATION LEFT INDEX FINGER;  Surgeon: Dayna Barker, MD;  Location: [facility];  Service: Plastics;  Laterality: Left;  . I&D EXTREMITY Left 10/30/2013   Procedure: IRRIGATION AND DEBRIDEMENT AND REVISION AMPUTATION OF LEFT INDEX FINGER;  Surgeon: Dayna Barker, MD;  Location: [facility];  Service: Plastics;   Laterality: Left;      Social History:      Social History   Tobacco Use  . Smoking status: Current Every Day Smoker    Packs/day: 1.00    Years: 20.00    Pack years: 20.00    Types: Cigarettes  . Smokeless tobacco: Never Used  Substance Use Topics  . Alcohol use: No    Alcohol/week: 0.0 standard drinks       Family History :     Family History  Problem Relation Age of Onset  . Hypertension Mother   . Diabetes Father   . Diabetes Sister   . Diabetes Brother   . Heart disease Brother      Home Medications:   Prior to Admission medications   Medication Sig Start Date End Date Taking? Authorizing Provider  blood glucose meter kit and supplies KIT Dispense based on patient and insurance preference. Use up to four times daily as directed. (FOR ICD-9 250.00, 250.01). 07/02/18   Aline August, MD  cephALEXin (KEFLEX) 500 MG capsule  Take 1 capsule (500 mg total) by mouth 3 (three) times daily. Patient not taking: Reported on 08/22/2018 07/19/18   Ward, Delice Bison, DO  insulin aspart (NOVOLOG) 100 UNIT/ML injection Inject 5 Units into the skin 3 (three) times daily with meals. Patient not taking: Reported on 08/22/2018 07/02/18   Aline August, MD  insulin detemir (LEVEMIR) 100 UNIT/ML injection Inject 0.3 mLs (30 Units total) into the skin daily. Patient not taking: Reported on 08/22/2018 07/03/18   Aline August, MD  Insulin Syringe-Needle U-100 (INSULIN SYRINGE .5CC/31GX5/16") 31G X 5/16" 0.5 ML MISC Levemir 30 units daily and NovoLog 5 units 3 times a day with meals 07/02/18   Aline August, MD  ondansetron (ZOFRAN) 4 MG tablet Take 1 tablet (4 mg total) by mouth every 6 (six) hours as needed for nausea. Patient not taking: Reported on 08/22/2018 07/02/18   Aline August, MD  tamsulosin (FLOMAX) 0.4 MG CAPS capsule Take 1 capsule (0.4 mg total) by mouth daily after supper. Patient not taking: Reported on 08/22/2018 07/02/18   Aline August, MD     Allergies:    No Known  Allergies   Physical Exam:   Vitals  Blood pressure 127/84, pulse 88, temperature 98.6 F (37 C), temperature source Oral, resp. rate 18, height '6\' 3"'  (1.905 m), weight 88.5 kg, SpO2 100 %.  1.  General: Appears in no acute distress  2. Psychiatric: Alert, oriented x3, intact insight and judgment  3. Neurologic: Cranial nerves II through grossly intact, motor strength 5/5 in all extremities  4. HEENMT:  Atraumatic normocephalic, extraocular muscles are intact  5. Respiratory : Clear to auscultation bilaterally, no wheezing or crackles  6. Cardiovascular : S1-S2, regular, no murmur auscultated  7. Gastrointestinal:  Abdomen is soft, nontender, no organomegaly  8. Skin:    Dried wound noted on the medial aspect of right lower extremity  Right lower extremity is edematous, nontender to palpation    Data Review:    CBC Recent Labs  Lab 11/10/18 0437  WBC 13.0*  HGB 12.7*  HCT 40.7  PLT 331  MCV 84.8  MCH 26.5  MCHC 31.2  RDW 14.0  LYMPHSABS 2.4  MONOABS 1.1*  EOSABS 0.1  BASOSABS 0.1   ------------------------------------------------------------------------------------------------------------------  Results for orders placed or performed during the hospital encounter of 11/10/18 (from the past 48 hour(s))  POC CBG, ED     Status: Abnormal   Collection Time: 11/10/18  4:35 AM  Result Value Ref Range   Glucose-Capillary 492 (H) 70 - 99 mg/dL  Urinalysis, Routine w reflex microscopic     Status: Abnormal   Collection Time: 11/10/18  4:37 AM  Result Value Ref Range   Color, Urine YELLOW YELLOW   APPearance HAZY (A) CLEAR   Specific Gravity, Urine 1.016 1.005 - 1.030   pH 6.0 5.0 - 8.0   Glucose, UA >=500 (A) NEGATIVE mg/dL   Hgb urine dipstick MODERATE (A) NEGATIVE   Bilirubin Urine NEGATIVE NEGATIVE   Ketones, ur NEGATIVE NEGATIVE mg/dL   Protein, ur 30 (A) NEGATIVE mg/dL   Nitrite NEGATIVE NEGATIVE   Leukocytes,Ua LARGE (A) NEGATIVE   RBC / HPF  6-10 0 - 5 RBC/hpf   WBC, UA >50 (H) 0 - 5 WBC/hpf   Bacteria, UA FEW (A) NONE SEEN    Comment: Performed at [facility], [place] [address] [place], [place] [address]  CBC with Differential     Status: Abnormal   Collection Time: 11/10/18  4:37 AM  Result Value Ref Range   WBC 13.0 (H) 4.0 - 10.5 K/uL   RBC 4.80 4.22 - 5.81 MIL/uL   Hemoglobin 12.7 (L) 13.0 - 17.0 g/dL   HCT 40.7 39.0 - 52.0 %   MCV 84.8 80.0 - 100.0 fL   MCH 26.5 26.0 - 34.0 pg   MCHC 31.2 30.0 - 36.0 g/dL   RDW 14.0 11.5 - 15.5 %   Platelets 331 150 - 400 K/uL   nRBC 0.0 0.0 - 0.2 %   Neutrophils Relative % 71 %   Neutro Abs 9.2 (H) 1.7 - 7.7 K/uL   Lymphocytes Relative 18 %   Lymphs Abs 2.4 0.7 - 4.0 K/uL   Monocytes Relative 8 %   Monocytes Absolute 1.1 (H) 0.1 - 1.0 K/uL   Eosinophils Relative 1 %   Eosinophils Absolute 0.1 0.0 - 0.5 K/uL   Basophils Relative 1 %   Basophils Absolute 0.1 0.0 - 0.1 K/uL   Immature Granulocytes 1 %   Abs Immature Granulocytes 0.13 (H) 0.00 - 0.07 K/uL    Comment: Performed at [facility], [place] [address], [place], [place] [address]  Comprehensive metabolic panel     Status: Abnormal   Collection Time: 11/10/18  4:37 AM  Result Value Ref Range   Sodium 131 (L) 135 - 145 mmol/L   Potassium 4.3 3.5 - 5.1 mmol/L   Chloride 98 98 - 111 mmol/L   CO2 23 22 - 32 mmol/L   Glucose, Bld 465 (H) 70 - 99 mg/dL   BUN 34 (H) 6 - 20 mg/dL   Creatinine, Ser 2.76 (H) 0.61 - 1.24 mg/dL   Calcium 9.3 8.9 - 10.3 mg/dL   Total Protein 8.5 (H) 6.5 - 8.1 g/dL   Albumin 3.3 (L) 3.5 - 5.0 g/dL   AST 11 (L) 15 - 41 U/L   ALT 8 0 - 44 U/L   Alkaline Phosphatase 121 38 - 126 U/L   Total Bilirubin 0.5 0.3 - 1.2 mg/dL   GFR calc non Af Amer 24 (L) >60 mL/min   GFR calc Af Amer 28 (L) >60 mL/min   Anion gap 10 5 - 15    Comment: Performed at [facility], [place] [address], [place], [place] [address]  Lactic acid, plasma     Status: None    Collection Time: 11/10/18  4:44 AM  Result Value Ref Range   Lactic Acid, Venous 1.0 0.5 - 1.9 mmol/L    Comment: Performed at [facility], [place] [address], [place], [place] [address]  D-dimer, quantitative (not at [facility])     Status: Abnormal   Collection Time: 11/10/18  4:56 AM  Result Value Ref Range   D-Dimer, Quant 3.42 (H) 0.00 - 0.50 ug/mL-FEU    Comment: (NOTE) At the manufacturer cut-off of 0.50 ug/mL FEU, this assay has been documented to exclude PE with a sensitivity and negative predictive value of 97 to 99%.  At this time, this assay has not been approved by the FDA to exclude DVT/VTE. Results should be correlated with clinical presentation. Performed at [facility], [place] [address] [place], [place] [address]     Chemistries  Recent Labs  Lab 11/10/18 0437  NA 131*  K 4.3  CL 98  CO2 23  GLUCOSE 465*  BUN 34*  CREATININE 2.76*  CALCIUM 9.3  AST 11*  ALT 8  ALKPHOS 121  BILITOT 0.5   ------------------------------------------------------------------------------------------------------------------  ------------------------------------------------------------------------------------------------------------------ GFR: Estimated  Creatinine Clearance: 35.3 mL/min (A) (by C-G formula based on SCr of 2.76 mg/dL (H)). Liver Function Tests: Recent Labs  Lab 11/10/18 0437  AST 11*  ALT 8  ALKPHOS 121  BILITOT 0.5  PROT 8.5*  ALBUMIN 3.3*   No results for input(s): LIPASE, AMYLASE in the last 168 hours. No results for input(s): AMMONIA in the last 168 hours. Coagulation Profile: No results for input(s): INR, PROTIME in the last 168 hours. Cardiac Enzymes: No results for input(s): CKTOTAL, CKMB, CKMBINDEX, TROPONINI in the last 168 hours. BNP (last 3 results) No results for input(s): PROBNP in the last 8760 hours. HbA1C: No results for input(s): HGBA1C in the last 72 hours. CBG: Recent Labs  Lab 11/10/18 0435   GLUCAP 492*   Lipid Profile: No results for input(s): CHOL, HDL, LDLCALC, TRIG, CHOLHDL, LDLDIRECT in the last 72 hours. Thyroid Function Tests: No results for input(s): TSH, T4TOTAL, FREET4, T3FREE, THYROIDAB in the last 72 hours. Anemia Panel: No results for input(s): VITAMINB12, FOLATE, FERRITIN, TIBC, IRON, RETICCTPCT in the last 72 hours.  --------------------------------------------------------------------------------------------------------------- Urine analysis:    Component Value Date/Time   COLORURINE YELLOW 11/10/2018 0437   APPEARANCEUR HAZY (A) 11/10/2018 0437   LABSPEC 1.016 11/10/2018 0437   PHURINE 6.0 11/10/2018 0437   GLUCOSEU >=500 (A) 11/10/2018 0437   HGBUR MODERATE (A) 11/10/2018 [place] 11/10/2018 [place] 11/10/2018 0437   PROTEINUR 30 (A) 11/10/2018 0437   UROBILINOGEN 0.2 10/26/2013 0034   NITRITE NEGATIVE 11/10/2018 0437   LEUKOCYTESUR LARGE (A) 11/10/2018 0437      Imaging Results:    No results found.     Assessment & Plan:    Active Problems:   Leg swelling   1. DVT-venous duplex of right lower extremity was done which is positive for popliteal DVT.  Will start Lovenox per pharmacy consultation.  Patient had difficulty acquiring medications as outpatient.  Will need case management consultation for anticoagulation as outpatient.  2. UTI/urine retention-patient has chronic indwelling Foley catheter placed on July 8 for urine retention, he has abnormal UA.  Started on ceftriaxone for UTI.  Urine culture has been obtained.  Follow urine culture results.  We will continue with ceftriaxone.  I have called and discussed with urology, Dr. Tammi Klippel will see patient today.  Continue Flomax.  3. Diabetes mellitus type 2-continue Levemir 30 units subcu daily, start sliding scale insulin with NovoLog.  Check CBG q. before meals and at bedtime.  4. CKD stage III/IV-creatinine is at baseline.  Follow BMP in a.m.     DVT Prophylaxis-   Lovenox   AM Labs Ordered, also please review Full Orders  Family Communication: Admission, patients condition and plan of care including tests being ordered have been discussed with the patient  who indicate understanding and agree with the plan and Code Status.  Code Status: Full code  Admission status: Inpatient: Based on patients clinical presentation and evaluation of above clinical data, I have made determination that patient meets Inpatient criteria at this time.  Time spent in minutes : 60 minutes   Oswald Hillock M.D on 11/10/2018 at 8:40 AM

## 2018-11-10 NOTE — ED Notes (Addendum)
Pt's leg bag was replaced. Catheter draining white pus like urine. Pt states he cleans his catheter with GermX

## 2018-11-10 NOTE — ED Provider Notes (Signed)
Patient has a history of diabetes but has run out of his medication.  He also describes a wound on his left leg that he has had for the past week with swelling and drainage.    Medical screening examination/treatment/procedure(s) were conducted as a shared visit with non-physician practitioner(s) and myself.  I personally evaluated the patient during the encounter.        Rolland Porter, MD 11/10/18 [phone]

## 2018-11-10 NOTE — Consult Note (Signed)
Reason for Consult: Urinary Retention, Stage 4 Renal Insuficiency, Left Renal Mass, Bilateral Hydronephrosis,   Referring Physician: Kandis Fantasia MD  Timothy Madden is an 58 y.o. male.   HPI:   1 - Urinary Retention - urinary retention noted 08/2018 (incidetnal, pt with no baseline voiding complaints) and catheter placed, placed on tamsulosin. He is brittle diabetic.  2 - Stage 4 Renal Insufficiency - Cr 2/7 with GFR mid 20s x several 2020, minmal change with foley. He is non-compliant diabetic with A1c 14s and massive glycosuria on UA.   3 - Left Renal Mass - lef hypoechoic mass <2cm without mass effect indicetnal on renal US 2020.  4 - Bilateral Hydronephrosis - severe bilateral hydro 08/2018 on eval CKD and urianry reteniton, improved after foley.  5 - Prostate Screening - No FHX prostate cancer. DRE 10/2018 35gm firm but non-nodular. He has major metabolic comorbidity.  PMH sig of IDDM2 (A1c 14s, recurrent DKA and infections). No regular PCP / outpatient care. Lives with family in [place].   Today "Timothy Madden" is seen in consultation for above. He is being admitted with acute DVT.     Past Medical History:  Diagnosis Date  . Amputation of finger, left   . Complication of anesthesia   . Diabetes mellitus without complication ([place])   . DKA (diabetic ketoacidoses) ([place]) 06/2018  . PONV (postoperative nausea and vomiting)     Past Surgical History:  Procedure Laterality Date  . I&D EXTREMITY Left 10/26/2013   Procedure: IRRIGATION AND DEBRIDEMENT EXTREMITY,PARTIAL AMPUTATION LEFT INDEX FINGER;  Surgeon: Dayna Barker, MD;  Location: [facility];  Service: Plastics;  Laterality: Left;  . I&D EXTREMITY Left 10/30/2013   Procedure: IRRIGATION AND DEBRIDEMENT AND REVISION AMPUTATION OF LEFT INDEX FINGER;  Surgeon: Dayna Barker, MD;  Location: [facility];  Service: Plastics;  Laterality: Left;    Family History  Problem Relation Age of Onset  . Hypertension Mother   . Diabetes Father   .  Diabetes Sister   . Diabetes Brother   . Heart disease Brother     Social History:  reports that he has been smoking cigarettes. He has a 20.00 pack-year smoking history. He has never used smokeless tobacco. He reports that he does not drink alcohol or use drugs.  Allergies: No Known Allergies  Medications: I have reviewed the patient's current medications.  Results for orders placed or performed during the hospital encounter of 11/10/18 (from the past 48 hour(s))  POC CBG, ED     Status: Abnormal   Collection Time: 11/10/18  4:35 AM  Result Value Ref Range   Glucose-Capillary 492 (H) 70 - 99 mg/dL  Urinalysis, Routine w reflex microscopic     Status: Abnormal   Collection Time: 11/10/18  4:37 AM  Result Value Ref Range   Color, Urine YELLOW YELLOW   APPearance HAZY (A) CLEAR   Specific Gravity, Urine 1.016 1.005 - 1.030   pH 6.0 5.0 - 8.0   Glucose, UA >=500 (A) NEGATIVE mg/dL   Hgb urine dipstick MODERATE (A) NEGATIVE   Bilirubin Urine NEGATIVE NEGATIVE   Ketones, ur NEGATIVE NEGATIVE mg/dL   Protein, ur 30 (A) NEGATIVE mg/dL   Nitrite NEGATIVE NEGATIVE   Leukocytes,Ua LARGE (A) NEGATIVE   RBC / HPF 6-10 0 - 5 RBC/hpf   WBC, UA >50 (H) 0 - 5 WBC/hpf   Bacteria, UA FEW (A) NONE SEEN    Comment: Performed at [facility], [place] [address] [place], [place] [address]  CBC with Differential     Status: Abnormal   Collection Time: 11/10/18  4:37 AM  Result Value Ref Range   WBC 13.0 (H) 4.0 - 10.5 K/uL   RBC 4.80 4.22 - 5.81 MIL/uL   Hemoglobin 12.7 (L) 13.0 - 17.0 g/dL   HCT 40.7 39.0 - 52.0 %   MCV 84.8 80.0 - 100.0 fL   MCH 26.5 26.0 - 34.0 pg   MCHC 31.2 30.0 - 36.0 g/dL   RDW 14.0 11.5 - 15.5 %   Platelets 331 150 - 400 K/uL   nRBC 0.0 0.0 - 0.2 %   Neutrophils Relative % 71 %   Neutro Abs 9.2 (H) 1.7 - 7.7 K/uL   Lymphocytes Relative 18 %   Lymphs Abs 2.4 0.7 - 4.0 K/uL   Monocytes Relative 8 %   Monocytes Absolute 1.1 (H) 0.1 - 1.0 K/uL    Eosinophils Relative 1 %   Eosinophils Absolute 0.1 0.0 - 0.5 K/uL   Basophils Relative 1 %   Basophils Absolute 0.1 0.0 - 0.1 K/uL   Immature Granulocytes 1 %   Abs Immature Granulocytes 0.13 (H) 0.00 - 0.07 K/uL    Comment: Performed at [facility], [place] [address], [place], [place] [address]  Comprehensive metabolic panel     Status: Abnormal   Collection Time: 11/10/18  4:37 AM  Result Value Ref Range   Sodium 131 (L) 135 - 145 mmol/L   Potassium 4.3 3.5 - 5.1 mmol/L   Chloride 98 98 - 111 mmol/L   CO2 23 22 - 32 mmol/L   Glucose, Bld 465 (H) 70 - 99 mg/dL   BUN 34 (H) 6 - 20 mg/dL   Creatinine, Ser 2.76 (H) 0.61 - 1.24 mg/dL   Calcium 9.3 8.9 - 10.3 mg/dL   Total Protein 8.5 (H) 6.5 - 8.1 g/dL   Albumin 3.3 (L) 3.5 - 5.0 g/dL   AST 11 (L) 15 - 41 U/L   ALT 8 0 - 44 U/L   Alkaline Phosphatase 121 38 - 126 U/L   Total Bilirubin 0.5 0.3 - 1.2 mg/dL   GFR calc non Af Amer 24 (L) >60 mL/min   GFR calc Af Amer 28 (L) >60 mL/min   Anion gap 10 5 - 15    Comment: Performed at [facility], [place] [address] [place], [place] [address]  Lactic acid, plasma     Status: None   Collection Time: 11/10/18  4:44 AM  Result Value Ref Range   Lactic Acid, Venous 1.0 0.5 - 1.9 mmol/L    Comment: Performed at [facility], [place] [address], [place], [place] [address]  D-dimer, quantitative (not at [facility])     Status: Abnormal   Collection Time: 11/10/18  4:56 AM  Result Value Ref Range   D-Dimer, Quant 3.42 (H) 0.00 - 0.50 ug/mL-FEU    Comment: (NOTE) At the manufacturer cut-off of 0.50 ug/mL FEU, this assay has been documented to exclude PE with a sensitivity and negative predictive value of 97 to 99%.  At this time, this assay has not been approved by the FDA to exclude DVT/VTE. Results should be correlated with clinical presentation. Performed at [facility], [place] [address], [place], [place] [address]     Vas  [place] Lower Extremity Venous (dvt) (only [facility])  Result Date: 11/10/2018  Lower Venous Study Indications: Swelling.  Limitations: Bandages/ appliances. Comparison Study: 09/13/18 negative Performing Technologist: June Leap  RDMS, RVT  Examination Guidelines: A complete evaluation includes B-mode imaging, spectral Doppler, color Doppler, and power Doppler as needed of all accessible portions of each vessel. Bilateral testing is considered an integral part of a complete examination. Limited examinations for reoccurring indications may be performed as noted.  +---------+---------------+---------+-----------+----------+--------------+ RIGHT    CompressibilityPhasicitySpontaneityPropertiesThrombus Aging +---------+---------------+---------+-----------+----------+--------------+ CFV      Full           Yes      Yes                                 +---------+---------------+---------+-----------+----------+--------------+ SFJ      Full                                                        +---------+---------------+---------+-----------+----------+--------------+ FV Prox  Full                                                        +---------+---------------+---------+-----------+----------+--------------+ FV Mid   Full                                                        +---------+---------------+---------+-----------+----------+--------------+ FV DistalFull                                                        +---------+---------------+---------+-----------+----------+--------------+ PFV      Full                                                        +---------+---------------+---------+-----------+----------+--------------+ POP      None           No       Yes                  Acute          +---------+---------------+---------+-----------+----------+--------------+ PTV      None                                         Acute           +---------+---------------+---------+-----------+----------+--------------+ PERO     Full                                                        +---------+---------------+---------+-----------+----------+--------------+   +----+---------------+---------+-----------+----------+--------------+ LEFTCompressibilityPhasicitySpontaneityProperties               +----+---------------+---------+-----------+----------+--------------+  CFV                                              Not visualized +----+---------------+---------+-----------+----------+--------------+     Summary: Right: Findings consistent with acute deep vein thrombosis involving the right popliteal vein, and right posterior tibial veins. No cystic structure found in the popliteal fossa.  *See table(s) above for measurements and observations.    Preliminary     Review of Systems  Constitutional: Positive for malaise/fatigue.  Genitourinary: Negative for flank pain.  Musculoskeletal: Positive for myalgias.   Blood pressure (!) 152/90, pulse 85, temperature 98.6 F (37 C), temperature source Oral, resp. rate 18, height 6\' 3"  (1.905 m), weight 88.5 kg, SpO2 99 %. Physical Exam  Constitutional:  Disheveled but pleasant and cooperative.   HENT:  Head: Normocephalic.  Eyes: Pupils are equal, round, and reactive to light.  Neck: Normal range of motion.  Cardiovascular: Normal rate.  Respiratory: Effort normal.  GI: Soft.  Genitourinary:    Genitourinary Comments: NO CVAT. Foley in place with non-foul urine.    Musculoskeletal:     Comments: RLE soft edema that is assymetric compared to left.   Neurological: He is alert.  Skin: Skin is warm.  Psychiatric: He has a normal mood and affect.    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 His prostate is not massive on exam. I restarted tamsulosin  BID, likely trial of void tomorrow after meds dosed x several.   2 - Stage 4 Renal Insufficiency - likely multifactorial from urinary retention and severe diabetes. Importance of glycemic control reiterated.   3 - Left Renal Mass - very UNlikley to ever be clinically relavant wti his comorbidity. Non- CT today to further characterize and to r/o other mechanical sources of hydro.   4 - Bilateral Hydronephrosis - likely from urinary retention. CT today to r/o other sources.   5 - Prostate Screening - DRE today reassuring. NO indication for PSA at this point given his comorbidity, may consider if CT findings concerning (bony lesions, pelvic adenopathy).   Please call me directly with questions.  Alexis Frock 11/10/2018, 10:35 AM

## 2018-11-10 NOTE — ED Notes (Signed)
Patient blood sugar 409. M.D notified and order levemir to be given. Basic metabolic panel order per standing order to recheck blood sugar.

## 2018-11-10 NOTE — ED Notes (Signed)
Lab called to add on a urine culture.  

## 2018-11-10 NOTE — ED Notes (Signed)
ED TO INPATIENT HANDOFF REPORT  ED Nurse Name and Phone #: [phone]  S Name/Age/Gender Timothy Madden 58 y.o. male Room/Bed: WA20/WA20  Code Status   Code Status: Full Code  Home/SNF/Other Home Patient oriented to: self, place, time and situation Is this baseline? Yes   Triage Complete: Triage complete  Chief Complaint Leaking leg bag  Triage Note Arrives via EMS from home. Around 2 days ago he says his catheter started leaking, also noticed swelling and pain in bilateral feet. Seen about 2 weeks ago for a wound on his upper thigh. Patient reports he has not taken his diabetes medication, CBG 394. Patient ambulatory upon arrival.    Allergies No Known Allergies  Level of Care/Admitting Diagnosis ED Disposition    ED Disposition Condition [facility] Area: [place] [[id number]]  Level of Care: Med-Surg [16]  Covid Evaluation: Asymptomatic Screening Protocol (No Symptoms)  Diagnosis: Leg swelling [283662]  Admitting Physician: Loree Fee  Attending Physician: Oswald Hillock [[id number]]  Estimated length of stay: 3 - 4 days  Certification:: I certify this patient will need inpatient services for at least 2 midnights  PT Class (Do Not Modify): Inpatient [101]  PT Acc Code (Do Not Modify): Private [1]       B Medical/Surgery History Past Medical History:  Diagnosis Date  . Amputation of finger, left   . Complication of anesthesia   . Diabetes mellitus without complication ([place])   . DKA (diabetic ketoacidoses) ([place]) 06/2018  . PONV (postoperative nausea and vomiting)    Past Surgical History:  Procedure Laterality Date  . I&D EXTREMITY Left 10/26/2013   Procedure: IRRIGATION AND DEBRIDEMENT EXTREMITY,PARTIAL AMPUTATION LEFT INDEX FINGER;  Surgeon: Dayna Barker, MD;  Location: [place];  Service: Plastics;  Laterality: Left;  . I&D EXTREMITY Left 10/30/2013   Procedure: IRRIGATION AND DEBRIDEMENT AND REVISION AMPUTATION OF  LEFT INDEX FINGER;  Surgeon: Dayna Barker, MD;  Location: [place];  Service: Plastics;  Laterality: Left;     A IV Location/Drains/Wounds Patient Lines/Drains/Airways Status   Active Line/Drains/Airways    Name:   Placement date:   Placement time:   Site:   Days:   Peripheral IV 11/10/18 Left Forearm   11/10/18    0520    Forearm   less than 1   Urethral Catheter kelly Straight-tip 16 Fr.   07/19/18    0405    Straight-tip   114   Pressure Injury 06/28/18 Foot Right black thicken area on ball of foot   06/28/18    1732     135          Intake/Output Last 24 hours  Intake/Output Summary (Last 24 hours) at 11/10/2018 1449 Last data filed at 11/10/2018 1025 Gross per 24 hour  Intake 2100 ml  Output -  Net 2100 ml    Labs/Imaging Results for orders placed or performed during the hospital encounter of 11/10/18 (from the past 48 hour(s))  POC CBG, ED     Status: Abnormal   Collection Time: 11/10/18  4:35 AM  Result Value Ref Range   Glucose-Capillary 492 (H) 70 - 99 mg/dL  Urinalysis, Routine w reflex microscopic     Status: Abnormal   Collection Time: 11/10/18  4:37 AM  Result Value Ref Range   Color, Urine YELLOW YELLOW   APPearance HAZY (A) CLEAR   Specific Gravity, Urine 1.016 1.005 - 1.030   pH 6.0 5.0 - 8.0  Glucose, UA >=500 (A) NEGATIVE mg/dL   Hgb urine dipstick MODERATE (A) NEGATIVE   Bilirubin Urine NEGATIVE NEGATIVE   Ketones, ur NEGATIVE NEGATIVE mg/dL   Protein, ur 30 (A) NEGATIVE mg/dL   Nitrite NEGATIVE NEGATIVE   Leukocytes,Ua LARGE (A) NEGATIVE   RBC / HPF 6-10 0 - 5 RBC/hpf   WBC, UA >50 (H) 0 - 5 WBC/hpf   Bacteria, UA FEW (A) NONE SEEN    Comment: Performed at [facility], [place] [address], [place], [place] [address]  CBC with Differential     Status: Abnormal   Collection Time: 11/10/18  4:37 AM  Result Value Ref Range   WBC 13.0 (H) 4.0 - 10.5 K/uL   RBC 4.80 4.22 - 5.81 MIL/uL   Hemoglobin 12.7 (L) 13.0 - 17.0 g/dL   HCT 40.7  39.0 - 52.0 %   MCV 84.8 80.0 - 100.0 fL   MCH 26.5 26.0 - 34.0 pg   MCHC 31.2 30.0 - 36.0 g/dL   RDW 14.0 11.5 - 15.5 %   Platelets 331 150 - 400 K/uL   nRBC 0.0 0.0 - 0.2 %   Neutrophils Relative % 71 %   Neutro Abs 9.2 (H) 1.7 - 7.7 K/uL   Lymphocytes Relative 18 %   Lymphs Abs 2.4 0.7 - 4.0 K/uL   Monocytes Relative 8 %   Monocytes Absolute 1.1 (H) 0.1 - 1.0 K/uL   Eosinophils Relative 1 %   Eosinophils Absolute 0.1 0.0 - 0.5 K/uL   Basophils Relative 1 %   Basophils Absolute 0.1 0.0 - 0.1 K/uL   Immature Granulocytes 1 %   Abs Immature Granulocytes 0.13 (H) 0.00 - 0.07 K/uL    Comment: Performed at [facility], [place] [address], [place], [place] [address]  Comprehensive metabolic panel     Status: Abnormal   Collection Time: 11/10/18  4:37 AM  Result Value Ref Range   Sodium 131 (L) 135 - 145 mmol/L   Potassium 4.3 3.5 - 5.1 mmol/L   Chloride 98 98 - 111 mmol/L   CO2 23 22 - 32 mmol/L   Glucose, Bld 465 (H) 70 - 99 mg/dL   BUN 34 (H) 6 - 20 mg/dL   Creatinine, Ser 2.76 (H) 0.61 - 1.24 mg/dL   Calcium 9.3 8.9 - 10.3 mg/dL   Total Protein 8.5 (H) 6.5 - 8.1 g/dL   Albumin 3.3 (L) 3.5 - 5.0 g/dL   AST 11 (L) 15 - 41 U/L   ALT 8 0 - 44 U/L   Alkaline Phosphatase 121 38 - 126 U/L   Total Bilirubin 0.5 0.3 - 1.2 mg/dL   GFR calc non Af Amer 24 (L) >60 mL/min   GFR calc Af Amer 28 (L) >60 mL/min   Anion gap 10 5 - 15    Comment: Performed at [facility], [place] [address] [place], [place] [address]  Lactic acid, plasma     Status: None   Collection Time: 11/10/18  4:44 AM  Result Value Ref Range   Lactic Acid, Venous 1.0 0.5 - 1.9 mmol/L    Comment: Performed at [facility], [place] [address], [place], [place] [address]  D-dimer, quantitative (not at [facility])     Status: Abnormal   Collection Time: 11/10/18  4:56 AM  Result Value Ref Range   D-Dimer, Quant 3.42 (H) 0.00 - 0.50 ug/mL-FEU    Comment: (NOTE) At the  manufacturer cut-off of 0.50 ug/mL  FEU, this assay has been documented to exclude PE with a sensitivity and negative predictive value of 97 to 99%.  At this time, this assay has not been approved by the FDA to exclude DVT/VTE. Results should be correlated with clinical presentation. Performed at [facility], [place] [address], [place], [place] [address]   CBG monitoring, ED     Status: Abnormal   Collection Time: 11/10/18 11:56 AM  Result Value Ref Range   Glucose-Capillary 409 (H) 70 - 99 mg/dL  Lactic acid, plasma     Status: None   Collection Time: 11/10/18 11:57 AM  Result Value Ref Range   Lactic Acid, Venous 1.0 0.5 - 1.9 mmol/L    Comment: Performed at [facility], [place] [address] [place], [place] [address]  CBC     Status: Abnormal   Collection Time: 11/10/18 11:57 AM  Result Value Ref Range   WBC 14.1 (H) 4.0 - 10.5 K/uL   RBC 4.28 4.22 - 5.81 MIL/uL   Hemoglobin 11.3 (L) 13.0 - 17.0 g/dL   HCT 36.0 (L) 39.0 - 52.0 %   MCV 84.1 80.0 - 100.0 fL   MCH 26.4 26.0 - 34.0 pg   MCHC 31.4 30.0 - 36.0 g/dL   RDW 14.0 11.5 - 15.5 %   Platelets 357 150 - 400 K/uL   nRBC 0.0 0.0 - 0.2 %    Comment: Performed at [facility], [place] [address], [place], [place] [address]  Creatinine, serum     Status: Abnormal   Collection Time: 11/10/18 11:57 AM  Result Value Ref Range   Creatinine, Ser 2.56 (H) 0.61 - 1.24 mg/dL   GFR calc non Af Amer 27 (L) >60 mL/min   GFR calc Af Amer 31 (L) >60 mL/min    Comment: Performed at [facility], [place] [address], [place], [place] [address]  Basic metabolic panel     Status: Abnormal   Collection Time: 11/10/18 12:28 PM  Result Value Ref Range   Sodium 133 (L) 135 - 145 mmol/L   Potassium 4.7 3.5 - 5.1 mmol/L   Chloride 102 98 - 111 mmol/L   CO2 23 22 - 32 mmol/L   Glucose, Bld 389 (H) 70 - 99 mg/dL   BUN 30 (H) 6 - 20 mg/dL   Creatinine, Ser 2.53 (H) 0.61 - 1.24 mg/dL    Calcium 8.6 (L) 8.9 - 10.3 mg/dL   GFR calc non Af Amer 27 (L) >60 mL/min   GFR calc Af Amer 31 (L) >60 mL/min   Anion gap 8 5 - 15    Comment: Performed at [facility], [place] [address], [place], [place] [address]   Ct Abdomen Pelvis Wo Contrast  Result Date: 11/10/2018 CLINICAL DATA:  Bilateral hydronephrosis. EXAM: CT ABDOMEN AND PELVIS WITHOUT CONTRAST TECHNIQUE: Multidetector CT imaging of the abdomen and pelvis was performed following the standard protocol without IV contrast. COMPARISON:  None. FINDINGS: Lower chest: No acute abnormality. Hepatobiliary: No focal liver abnormality is seen. No gallstones, gallbladder wall thickening, or biliary dilatation. Pancreas: Unremarkable. No pancreatic ductal dilatation or surrounding inflammatory changes. Spleen: Normal in size without focal abnormality. Adrenals/Urinary Tract: Adrenal glands are normal. Bilateral hydronephrosis, left greater than right is identified with some apparent cortical thickening on the left. No stones are identified. No suspicious renal masses are noted. Bilateral ureterectasis, left greater than right, is also identified. The ureters are dilated to the level of the bilateral ureteral  vesicular junctions. Both ureters insert more laterally and superiorly on the bladder than normal. The bladder wall is thickened. There is a Foley catheter in the bladder. Air in the bladder is likely from the Foley catheter. Stomach/Bowel: The stomach is somewhat distended, likely from a recent meal. Gastroparesis not excluded given history of diabetes. The small bowel is normal. Moderate fecal loading identified throughout the colon. The appendix is not seen but there is no secondary evidence of appendicitis. Vascular/Lymphatic: Mild atherosclerotic changes are seen in the nonaneurysmal aorta. No adenopathy identified. Reproductive: The prostate is unremarkable. The seminal vesicles are mildly prominent, particularly on the right, of  doubtful acute significance. Other: No abdominal wall hernia or abnormality. No abdominopelvic ascites. Musculoskeletal: No acute or significant osseous findings. IMPRESSION: 1. 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 The ureteral insertions are more superior and lateral than typical. The ectopic insertion could result in dysfunctional ureteral vesicular junctions which could result in reflux and hydronephrosis. No masses or stones are identified. 2. Bladder wall thickening could be due to chronic bladder outlet obstruction, a neurogenic bladder, or cystitis. Recommend clinical correlation. 3. The stomach is relatively prominent which could be due to a recent meal or gastroparesis. No convincing evidence of outlet obstruction. 4. Mild atherosclerotic changes are seen in the nonaneurysmal aorta. Electronically Signed   By: Dorise Bullion III M.D   On: 11/10/2018 12:37   Vas [place] Lower Extremity Venous (dvt) (only [place])  Result Date: 11/10/2018  Lower Venous Study Indications: Swelling.  Limitations: Bandages/ appliances. Comparison Study: 09/13/18 negative Performing Technologist: June Leap RDMS, RVT  Examination Guidelines: A complete evaluation includes B-mode imaging, spectral Doppler, color Doppler, and power Doppler as needed of all accessible portions of each vessel. Bilateral testing is considered an integral part of a complete examination. Limited examinations for reoccurring indications may be performed as noted.  +---------+---------------+---------+-----------+----------+--------------+ RIGHT    CompressibilityPhasicitySpontaneityPropertiesThrombus Aging +---------+---------------+---------+-----------+----------+--------------+ CFV      Full           Yes      Yes                                 +---------+---------------+---------+-----------+----------+--------------+ SFJ      Full                                                         +---------+---------------+---------+-----------+----------+--------------+ FV Prox  Full                                                        +---------+---------------+---------+-----------+----------+--------------+ FV Mid   Full                                                        +---------+---------------+---------+-----------+----------+--------------+ FV DistalFull                                                        +---------+---------------+---------+-----------+----------+--------------+  PFV      Full                                                        +---------+---------------+---------+-----------+----------+--------------+ POP      None           No       Yes                  Acute          +---------+---------------+---------+-----------+----------+--------------+ PTV      None                                         Acute          +---------+---------------+---------+-----------+----------+--------------+ PERO     Full                                                        +---------+---------------+---------+-----------+----------+--------------+   +----+---------------+---------+-----------+----------+--------------+ LEFTCompressibilityPhasicitySpontaneityProperties               +----+---------------+---------+-----------+----------+--------------+ CFV                                              Not visualized +----+---------------+---------+-----------+----------+--------------+     Summary: Right: Findings consistent with acute deep vein thrombosis involving the right popliteal vein, and right posterior tibial veins. No cystic structure found in the popliteal fossa.  *See table(s) above for measurements and observations.    Preliminary     Pending Labs Unresulted Labs (From admission, onward)    Start     Ordered   11/17/18 0500  Creatinine, serum  (enoxaparin (LOVENOX)    CrCl >/= 30 ml/min)  Weekly,   R     Comments: while on enoxaparin therapy    11/10/18 1004   11/12/18 0500  CBC  Every 72 hours,   R     11/10/18 0903   11/11/18 0500  Creatinine, serum  Daily,   R     11/10/18 0903   11/10/18 1026  Hemoglobin A1c  Once,   STAT    Comments: To assess prior glycemic control    11/10/18 1025   11/10/18 0805  SARS CORONAVIRUS 2 (TAT 6-24 HRS) Nasopharyngeal Nasopharyngeal Swab  (Asymptomatic/Tier 2 Patients Labs)  Once,   STAT    Question Answer Comment  Is this test for diagnosis or screening Screening   Symptomatic for COVID-19 as defined by CDC No   Hospitalized for COVID-19 No   Admitted to ICU for COVID-19 No   Previously tested for COVID-19 Yes   Resident in a congregate (group) care setting No   Employed in healthcare setting No      11/10/18 0805   11/10/18 0535  Urine culture  ONCE - STAT,   STAT    Question:  Patient immune status  Answer:  Immunocompromised   11/10/18 0534   Signed and Held  CBC  Tomorrow morning,  R     Signed and Held   Signed and Held  Comprehensive metabolic panel  Tomorrow morning,   R     Signed and Held          Vitals/Pain Today's Vitals   11/10/18 1000 11/10/18 1030 11/10/18 1100 11/10/18 1130  BP: (!) 152/90 139/78 (!) 139/96 (!) 141/84  Pulse: 85  85 93  Resp: 18   18  Temp:      TempSrc:      SpO2: 99%  100% 100%  Weight:      Height:      PainSc:        Isolation Precautions No active isolations  Medications Medications  insulin detemir (LEVEMIR) injection 30 Units (30 Units Subcutaneous Given 11/10/18 1405)  acetaminophen (TYLENOL) tablet 650 mg (has no administration in time range)    Or  acetaminophen (TYLENOL) suppository 650 mg (has no administration in time range)  ondansetron (ZOFRAN) tablet 4 mg (has no administration in time range)    Or  ondansetron (ZOFRAN) injection 4 mg (has no administration in time range)  insulin aspart (novoLOG) injection 0-9 Units (9 Units Subcutaneous Given 11/10/18 1326)   enoxaparin (LOVENOX) injection 90 mg (has no administration in time range)  tamsulosin (FLOMAX) capsule 0.4 mg (0.4 mg Oral Given 11/10/18 1333)  sodium chloride 0.9 % bolus 1,000 mL (0 mLs Intravenous Stopped 11/10/18 0703)  sodium chloride 0.9 % bolus 1,000 mL (0 mLs Intravenous Stopped 11/10/18 1025)  cefTRIAXone (ROCEPHIN) 1 g in sodium chloride 0.9 % 100 mL IVPB (0 g Intravenous Stopped 11/10/18 1025)  enoxaparin (LOVENOX) injection 90 mg (90 mg Subcutaneous Given 11/10/18 1010)    Mobility walks Moderate fall risk   Focused Assessments leg swelling   R Recommendations: See Admitting Provider Note  Report given to:   Additional Notes: N/A

## 2018-11-10 NOTE — ED Notes (Signed)
Vascular Tech called @06 :30am.

## 2018-11-10 NOTE — Progress Notes (Signed)
[place] for lovenox Indication: acute DVT  No Known Allergies  Patient Measurements: Height: 6\' 3"  (190.5 cm) Weight: 195 lb (88.5 kg) IBW/kg (Calculated) : 84.5 Heparin Dosing Weight:   Vital Signs: Temp: 98.6 F (37 C) (10/31 0022) Temp Source: Oral (10/31 0022) BP: 127/84 (10/31 0830) Pulse Rate: 88 (10/31 0830)  Labs: Recent Labs    11/10/18 0437  HGB 12.7*  HCT 40.7  PLT 331  CREATININE 2.76*    Estimated Creatinine Clearance: 35.3 mL/min (A) (by C-G formula based on SCr of 2.76 mg/dL (H)).   Assessment: Patient is a 58 y.o M with hx upper thigh wound, DM, and CKD presented to the ED on 10/31 with c/o LE swelling/pain.  LE doppler showed acute RLE DVT.  Pharmacy is consulted to start lovenox for acute DVT.  Today, 11/10/2018: - scr 2.76 (crcl~35) - cbc ok - no bleeding documented - D-dimer elevated  Goal of Therapy:  Anti-Xa level 0.6-1 units/ml 4hrs after LMWH dose given Monitor platelets by anticoagulation protocol: Yes   Plan:  - Lovenox 90 mg SQ q12h - monitor renal function closely - cbc q72h - monitor for s/s bleeding  Jing Howatt P 11/10/2018,8:53 AM

## 2018-11-11 ENCOUNTER — Encounter (HOSPITAL_COMMUNITY): Payer: Self-pay

## 2018-11-11 ENCOUNTER — Other Ambulatory Visit: Payer: Self-pay

## 2018-11-11 DIAGNOSIS — M79661 Pain in right lower leg: Secondary | ICD-10-CM

## 2018-11-11 LAB — CREATININE, SERUM
Creatinine, Ser: 2.5 mg/dL — ABNORMAL HIGH (ref 0.61–1.24)
GFR calc Af Amer: 32 mL/min — ABNORMAL LOW (ref >60)
GFR calc non Af Amer: 27 mL/min — ABNORMAL LOW (ref >60)

## 2018-11-11 LAB — GLUCOSE, CAPILLARY
Glucose-Capillary: 86 mg/dL (ref 70–99)
Glucose-Capillary: 238 mg/dL — ABNORMAL HIGH (ref 70–99)
Glucose-Capillary: 202 mg/dL — ABNORMAL HIGH (ref 70–99)

## 2018-11-11 LAB — CBC
HCT: 37 % — ABNORMAL LOW (ref 39.0–52.0)
Hemoglobin: 11.8 g/dL — ABNORMAL LOW (ref 13.0–17.0)
MCH: 26.5 pg (ref 26.0–34.0)
MCHC: 31.9 g/dL (ref 30.0–36.0)
MCV: 83 fL (ref 80.0–100.0)
Platelets: 364 10*3/uL (ref 150–400)
RBC: 4.46 MIL/uL (ref 4.22–5.81)
RDW: 14.1 % (ref 11.5–15.5)
WBC: 10.5 10*3/uL (ref 4.0–10.5)
nRBC: 0 % (ref 0.0–0.2)

## 2018-11-11 LAB — URINE CULTURE

## 2018-11-11 MED ORDER — SODIUM CHLORIDE 0.9 % IV SOLN: INTRAVENOUS | Status: DC

## 2018-11-11 MED ORDER — TAMSULOSIN HCL 0.4 MG PO CAPS: 0.4000 mg | ORAL_CAPSULE | Freq: Every day | ORAL | Status: DC

## 2018-11-11 NOTE — Progress Notes (Signed)
[facility]  PROGRESS NOTE  Timothy Madden EQA:[id number] DOB: 04-24-1960 DOA: 11/10/2018 PCP: Marliss Coots, NP   Brief HPI:   58 year old male with a history of diabetes mellitus type 2, CKD stage III, osteomyelitis who came to ED with complaints of right leg swelling.  Patient found to have DVT.  Started on Lovenox.  Also has history of UTI urine retention, chronic indwelling Foley catheter was placed on July 8.  Urology was consulted yesterday.    Subjective   Patient seen and examined, denies any new complaints.  No chest pain or shortness of breath.  Denies leg pain.   Assessment/Plan:     1. DVT-venous duplex of lower extremity positive for popliteal and tibial vein DVT.  Continue Lovenox per pharmacy consultation.  Patient lacks insurance, will discuss with case management regarding anticoagulation as outpatient.  2. UTI/urine retention-patient has chronic indwelling Foley catheter, started on ceftriaxone for UTI.  Urology has been consulted.  Started on tamsulosin twice daily.  3. Left renal mass-urology ordered CT abdomen without contrast, which showed ectopic insertion of the ureters bilaterally into better which are more superior and lateral than typical.  Which could result in dysfunctional ureteral vascular junction and reflux and hydronephrosis.  No masses or stones identified.  4. Bilateral hydronephrosis-because as above.  Will follow urology recommendations.       CBG: Recent Labs  Lab 11/10/18 1156 11/10/18 1539 11/10/18 2210 11/11/18 0740 11/11/18 1137  GLUCAP 409* 267* 202* 86 238*    CBC: Recent Labs  Lab 11/10/18 0437 11/10/18 1157  WBC 13.0* 14.1*  NEUTROABS 9.2*  --   HGB 12.7* 11.3*  HCT 40.7 36.0*  MCV 84.8 84.1  PLT 331 979    Basic Metabolic Panel: Recent Labs  Lab 11/10/18 0437 11/10/18 1157 11/10/18 1228 11/11/18 0525  NA 131*  --  133*  --   K 4.3  --  4.7  --   CL 98  --  102  --   CO2 23  --  23  --    GLUCOSE 465*  --  389*  --   BUN 34*  --  30*  --   CREATININE 2.76* 2.56* 2.53* 2.50*  CALCIUM 9.3  --  8.6*  --      DVT prophylaxis: Patient is on Lovenox full dose  Code Status: Full code  Family Communication: No family at bedside  Disposition Plan: likely home when medically ready for discharge Pressure Injury 06/28/18 Foot Right black thicken area on ball of foot (Active)  06/28/18 1732  Location: Foot (ball of right foot)  Location Orientation: Right  Staging:   Wound Description (Comments): black thicken area on ball of foot  Present on Admission: Yes        BMI  Estimated body mass index is 24.37 kg/m as calculated from the following:   Height as of this encounter: 6\' 3"  (1.905 m).   Weight as of this encounter: 88.5 kg.  Scheduled medications:  . Chlorhexidine Gluconate Cloth  6 each Topical Daily  . enoxaparin (LOVENOX) injection  90 mg Subcutaneous Q12H  . insulin aspart  0-9 Units Subcutaneous TID WC  . insulin detemir  30 Units Subcutaneous Daily  . tamsulosin  0.4 mg Oral BID    Consultants:  Urology  Procedures:  Lower extremity venous duplex   Antibiotics:   Anti-infectives (From admission, onward)   Start     Dose/Rate Route Frequency Ordered Stop   11/11/18 0900  cefTRIAXone (ROCEPHIN) 1 g in sodium chloride 0.9 % 100 mL IVPB     1 g 200 mL/hr over 30 Minutes Intravenous Every 24 hours 11/10/18 1605     11/10/18 0730  cefTRIAXone (ROCEPHIN) 1 g in sodium chloride 0.9 % 100 mL IVPB     1 g 200 mL/hr over 30 Minutes Intravenous  Once 11/10/18 0723 11/10/18 1025       Objective   Vitals:   11/10/18 1642 11/10/18 2140 11/11/18 0614 11/11/18 1303  BP: (!) 141/92 119/77 117/77 140/87  Pulse: 93 93 92 93  Resp: 16  18 15   Temp: 98.4 F (36.9 C) 98.6 F (37 C) 98.7 F (37.1 C) 98.9 F (37.2 C)  TempSrc: Oral Oral Oral Oral  SpO2: 100% 100% 100% 97%  Weight:      Height:        Intake/Output Summary (Last 24 hours) at  11/11/2018 1715 Last data filed at 11/11/2018 1257 Gross per 24 hour  Intake 120 ml  Output 500 ml  Net -380 ml   Filed Weights   11/10/18 0022  Weight: 88.5 kg     Physical Examination:    General: Appears in no acute distress  Cardiovascular: S1-S2, regular, no murmur auscultated  Respiratory: Clear to auscultation bilaterally  Abdomen: Abdomen is soft, nontender, no organomegaly  Extremities: Trace edema in both lower extremities right more than left  Neurologic: Cranial nerves II through XII grossly intact, intact insight and judgment     Data Reviewed: I have personally reviewed following labs and imaging studies   Recent Results (from the past 240 hour(s))  Urine culture     Status: Abnormal   Collection Time: 11/10/18  5:35 AM   Specimen: Urine, Catheterized  Result Value Ref Range Status   Specimen Description   Final    URINE, CATHETERIZED Performed at [facility], [place] [address] [place], [place] [address]    Special Requests   Final    Immunocompromised Performed at [facility], [place] [address], [place], [place] [address]    Culture MULTIPLE SPECIES PRESENT, SUGGEST RECOLLECTION (A)  Final   Report Status 11/11/2018 FINAL  Final  SARS CORONAVIRUS 2 (TAT 6-24 HRS) Nasopharyngeal Nasopharyngeal Swab     Status: None   Collection Time: 11/10/18  8:05 AM   Specimen: Nasopharyngeal Swab  Result Value Ref Range Status   SARS Coronavirus 2 NEGATIVE NEGATIVE Final    Comment: (NOTE) SARS-CoV-2 target nucleic acids are NOT DETECTED. The SARS-CoV-2 RNA is generally detectable in upper and lower respiratory specimens during the acute phase of infection. Negative results do not preclude SARS-CoV-2 infection, do not rule out co-infections with other pathogens, and should not be used as the sole basis for treatment or other patient management decisions. Negative results must be combined with clinical  observations, patient history, and epidemiological information. The expected result is Negative. Fact Sheet for Patients: SugarRoll.be Fact Sheet for Healthcare Providers: https://www.woods-mathews.com/ This test is not yet approved or cleared by the [place] FDA and  has been authorized for detection and/or diagnosis of SARS-CoV-2 by FDA under an Emergency Use Authorization (EUA). This EUA will remain  in effect (meaning this test can be used) for the duration of the COVID-19 declaration under Section 56 4(b)(1) of the Act, 21 U.S.C. section 360bbb-3(b)(1), unless the authorization is terminated or revoked sooner. Performed at [facility], [place] [address], [place], [place] [address]      Liver  Function Tests: Recent Labs  Lab 11/10/18 0437  AST 11*  ALT 8  ALKPHOS 121  BILITOT 0.5  PROT 8.5*  ALBUMIN 3.3*   No results for input(s): LIPASE, AMYLASE in the last 168 hours. No results for input(s): AMMONIA in the last 168 hours.  Cardiac Enzymes: No results for input(s): CKTOTAL, CKMB, CKMBINDEX, TROPONINI in the last 168 hours. BNP (last 3 results) No results for input(s): BNP in the last 8760 hours.  ProBNP (last 3 results) No results for input(s): PROBNP in the last 8760 hours.    Studies: Ct Abdomen Pelvis Wo Contrast  Result Date: 11/10/2018 CLINICAL DATA:  Bilateral hydronephrosis. EXAM: CT ABDOMEN AND PELVIS WITHOUT CONTRAST TECHNIQUE: Multidetector CT imaging of the abdomen and pelvis was performed following the standard protocol without IV contrast. COMPARISON:  None. FINDINGS: Lower chest: No acute abnormality. Hepatobiliary: No focal liver abnormality is seen. No gallstones, gallbladder wall thickening, or biliary dilatation. Pancreas: Unremarkable. No pancreatic ductal dilatation or surrounding inflammatory changes. Spleen: Normal in size without focal abnormality. Adrenals/Urinary Tract: Adrenal glands are  normal. Bilateral hydronephrosis, left greater than right is identified with some apparent cortical thickening on the left. No stones are identified. No suspicious renal masses are noted. Bilateral ureterectasis, left greater than right, is also identified. The ureters are dilated to the level of the bilateral ureteral vesicular junctions. Both ureters insert more laterally and superiorly on the bladder than normal. The bladder wall is thickened. There is a Foley catheter in the bladder. Air in the bladder is likely from the Foley catheter. Stomach/Bowel: The stomach is somewhat distended, likely from a recent meal. Gastroparesis not excluded given history of diabetes. The small bowel is normal. Moderate fecal loading identified throughout the colon. The appendix is not seen but there is no secondary evidence of appendicitis. Vascular/Lymphatic: Mild atherosclerotic changes are seen in the nonaneurysmal aorta. No adenopathy identified. Reproductive: The prostate is unremarkable. The seminal vesicles are mildly prominent, particularly on the right, of doubtful acute significance. Other: No abdominal wall hernia or abnormality. No abdominopelvic ascites. Musculoskeletal: No acute or significant osseous findings. IMPRESSION: 1. 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 The ureteral insertions are more superior and lateral than typical. The ectopic insertion could result in dysfunctional ureteral vesicular junctions which could result in reflux and hydronephrosis. No masses or stones are identified. 2. Bladder wall thickening could be due to chronic bladder outlet obstruction, a neurogenic bladder, or cystitis. Recommend clinical correlation. 3. The stomach is relatively prominent which could be due to a recent meal or gastroparesis. No convincing evidence of outlet obstruction. 4. Mild atherosclerotic changes are seen in the nonaneurysmal aorta. Electronically Signed   By: Dorise Bullion III M.D    On: 11/10/2018 12:37   Vas [place] Lower Extremity Venous (dvt) (only [place])  Result Date: 11/10/2018  Lower Venous Study Indications: Swelling.  Limitations: Bandages/ appliances. Comparison Study: 09/13/18 negative Performing Technologist: June Leap RDMS, RVT  Examination Guidelines: A complete evaluation includes B-mode imaging, spectral Doppler, color Doppler, and power Doppler as needed of all accessible portions of each vessel. Bilateral testing is considered an integral part of a complete examination. Limited examinations for reoccurring indications may be performed as noted.  +---------+---------------+---------+-----------+----------+--------------+ RIGHT    CompressibilityPhasicitySpontaneityPropertiesThrombus Aging +---------+---------------+---------+-----------+----------+--------------+ CFV      Full           Yes      Yes                                 +---------+---------------+---------+-----------+----------+--------------+  SFJ      Full                                                        +---------+---------------+---------+-----------+----------+--------------+ FV Prox  Full                                                        +---------+---------------+---------+-----------+----------+--------------+ FV Mid   Full                                                        +---------+---------------+---------+-----------+----------+--------------+ FV DistalFull                                                        +---------+---------------+---------+-----------+----------+--------------+ PFV      Full                                                        +---------+---------------+---------+-----------+----------+--------------+ POP      None           No       Yes                  Acute          +---------+---------------+---------+-----------+----------+--------------+ PTV      None                                          Acute          +---------+---------------+---------+-----------+----------+--------------+ PERO     Full                                                        +---------+---------------+---------+-----------+----------+--------------+   +----+---------------+---------+-----------+----------+--------------+ LEFTCompressibilityPhasicitySpontaneityProperties               +----+---------------+---------+-----------+----------+--------------+ CFV                                              Not visualized +----+---------------+---------+-----------+----------+--------------+     Summary: Right: Findings consistent with acute deep vein thrombosis involving the right popliteal vein, and right posterior tibial veins. No cystic structure found in the popliteal fossa.  *See table(s) above for measurements and observations. Electronically signed by Deitra Mayo MD on 11/10/2018 at 6:00:39 PM.  Final      Admission status: Inpatient: Based on patients clinical presentation and evaluation of above clinical data, I have made determination that patient meets Inpatient criteria at this time.  Time spent: 20 minutes  [facility] Pager [phone]. If 7PM-7AM, please contact night-coverage at [email or web address], Office  [phone]  password [email or web address]  11/11/2018, 5:15 PM  LOS: 1 day

## 2018-11-12 LAB — CBC
HCT: 40.1 % (ref 39.0–52.0)
Hemoglobin: 12.7 g/dL — ABNORMAL LOW (ref 13.0–17.0)
MCH: 26.6 pg (ref 26.0–34.0)
MCHC: 31.7 g/dL (ref 30.0–36.0)
MCV: 83.9 fL (ref 80.0–100.0)
Platelets: 391 10*3/uL (ref 150–400)
RBC: 4.78 MIL/uL (ref 4.22–5.81)
RDW: 14.2 % (ref 11.5–15.5)
WBC: 11.4 10*3/uL — ABNORMAL HIGH (ref 4.0–10.5)
nRBC: 0 % (ref 0.0–0.2)

## 2018-11-12 LAB — BASIC METABOLIC PANEL
Anion gap: 10 (ref 5–15)
BUN: 28 mg/dL — ABNORMAL HIGH (ref 6–20)
CO2: 21 mmol/L — ABNORMAL LOW (ref 22–32)
Calcium: 9.2 mg/dL (ref 8.9–10.3)
Chloride: 106 mmol/L (ref 98–111)
Creatinine, Ser: 2.49 mg/dL — ABNORMAL HIGH (ref 0.61–1.24)
GFR calc Af Amer: 32 mL/min — ABNORMAL LOW (ref >60)
GFR calc non Af Amer: 28 mL/min — ABNORMAL LOW (ref >60)
Glucose, Bld: 130 mg/dL — ABNORMAL HIGH (ref 70–99)
Potassium: 4.4 mmol/L (ref 3.5–5.1)
Sodium: 137 mmol/L (ref 135–145)

## 2018-11-12 LAB — GLUCOSE, CAPILLARY
Glucose-Capillary: 143 mg/dL — ABNORMAL HIGH (ref 70–99)
Glucose-Capillary: 213 mg/dL — ABNORMAL HIGH (ref 70–99)
Glucose-Capillary: 112 mg/dL — ABNORMAL HIGH (ref 70–99)
Glucose-Capillary: 249 mg/dL — ABNORMAL HIGH (ref 70–99)

## 2018-11-12 NOTE — Progress Notes (Signed)
[facility]  PROGRESS NOTE  Timothy Madden WUJ:[id number] DOB: [birth year] DOA: 11/10/2018 PCP: Marliss Coots, NP   Brief HPI:   58 year old male with a history of diabetes mellitus type 2, CKD stage III, osteomyelitis who came to ED with complaints of right leg swelling.  Patient found to have DVT.  Started on Lovenox.  Also has history of UTI urine retention, chronic indwelling Foley catheter was placed on July 8.  Urology was consulted yesterday.    Subjective   Patient seen and examined, denies abdominal pain.   Assessment/Plan:     1. DVT-venous duplex of lower extremity positive for popliteal and tibial vein DVT.  Continue Lovenox per pharmacy consultation.  Patient lacks insurance, will discuss with case management regarding anticoagulation as outpatient.  2. UTI/urine retention-patient has chronic indwelling Foley catheter, started on ceftriaxone for UTI.  Urine culture growing multiple species, will discontinue ceftriaxone at this time.  Urology has been consulted.  Started on tamsulosin twice daily.  Will discontinue Foley catheter for voiding trial tonight.  Discussed with urologist who agrees with the plan.  3. ?  Left renal mass-urology ordered CT abdomen without contrast, which showed ectopic insertion of the ureters bilaterally into better which are more superior and lateral than typical.  Which could result in dysfunctional ureteral vascular junction and reflux and hydronephrosis.  No masses or stones identified.  Discussed CT results with Dr. Tresa Moore, who feels patient can be discharged and follow-up with urology as outpatient.  4. Bilateral hydronephrosis-because as above.  Patient to follow-up with urology as outpatient.  5. Chronic kidney disease stage III-creatinine 2.49, at baseline.       CBG: Recent Labs  Lab 11/11/18 1137 11/11/18 2113 11/12/18 0753 11/12/18 1133 11/12/18 1654  GLUCAP 238* 202* 143* 213* 112*    CBC: Recent Labs  Lab  11/10/18 0437 11/10/18 1157 11/11/18 2003 11/12/18 0549  WBC 13.0* 14.1* 10.5 11.4*  NEUTROABS 9.2*  --   --   --   HGB 12.7* 11.3* 11.8* 12.7*  HCT 40.7 36.0* 37.0* 40.1  MCV 84.8 84.1 83.0 83.9  PLT 331 357 364 956    Basic Metabolic Panel: Recent Labs  Lab 11/10/18 0437 11/10/18 1157 11/10/18 1228 11/11/18 0525 11/12/18 0549  NA 131*  --  133*  --  137  K 4.3  --  4.7  --  4.4  CL 98  --  102  --  106  CO2 23  --  23  --  21*  GLUCOSE 465*  --  389*  --  130*  BUN 34*  --  30*  --  28*  CREATININE 2.76* 2.56* 2.53* 2.50* 2.49*  CALCIUM 9.3  --  8.6*  --  9.2     DVT prophylaxis: Patient is on Lovenox full dose  Code Status: Full code  Family Communication: No family at bedside  Disposition Plan: likely home when medically ready for discharge Pressure Injury 06/28/18 Foot Right black thicken area on ball of foot (Active)  06/28/18 1732  Location: Foot (ball of right foot)  Location Orientation: Right  Staging:   Wound Description (Comments): black thicken area on ball of foot  Present on Admission: Yes        BMI  Estimated body mass index is 24.37 kg/m as calculated from the following:   Height as of this encounter: 6\' 3"  (1.905 m).   Weight as of this encounter: 88.5 kg.  Scheduled medications:  . Chlorhexidine Gluconate Cloth  6 each Topical Daily  . enoxaparin (LOVENOX) injection  90 mg Subcutaneous Q12H  . insulin aspart  0-9 Units Subcutaneous TID WC  . insulin detemir  30 Units Subcutaneous Daily  . tamsulosin  0.4 mg Oral BID    Consultants:  Urology  Procedures:  Lower extremity venous duplex   Antibiotics:   Anti-infectives (From admission, onward)   Start     Dose/Rate Route Frequency Ordered Stop   11/11/18 0900  cefTRIAXone (ROCEPHIN) 1 g in sodium chloride 0.9 % 100 mL IVPB     1 g 200 mL/hr over 30 Minutes Intravenous Every 24 hours 11/10/18 1605     11/10/18 0730  cefTRIAXone (ROCEPHIN) 1 g in sodium chloride 0.9 %  100 mL IVPB     1 g 200 mL/hr over 30 Minutes Intravenous  Once 11/10/18 0723 11/10/18 1025       Objective   Vitals:   11/11/18 1303 11/11/18 2036 11/12/18 0600 11/12/18 1255  BP: 140/87 134/88 (!) 120/91 118/83  Pulse: 93 95 93 97  Resp: 15 15 16 16   Temp: 98.9 F (37.2 C) 99 F (37.2 C) 98.5 F (36.9 C) 98.3 F (36.8 C)  TempSrc: Oral Oral Oral Oral  SpO2: 97% 100% 100% 98%  Weight:      Height:        Intake/Output Summary (Last 24 hours) at 11/12/2018 1815 Last data filed at 11/12/2018 1500 Gross per 24 hour  Intake 840 ml  Output 1726 ml  Net -886 ml   Filed Weights   11/10/18 0022  Weight: 88.5 kg     Physical Examination:    General-appears in no acute distress  Heart-S1-S2, regular, no murmur auscultated  Lungs-clear to auscultation bilaterally, no wheezing or crackles auscultated  Abdomen-soft, nontender, no organomegaly  Extremities-no edema in the lower extremities  Neuro-alert, oriented x3, no focal deficit noted     Data Reviewed: I have personally reviewed following labs and imaging studies   Recent Results (from the past 240 hour(s))  Urine culture     Status: Abnormal   Collection Time: 11/10/18  5:35 AM   Specimen: Urine, Catheterized  Result Value Ref Range Status   Specimen Description   Final    URINE, CATHETERIZED Performed at [facility], [place] [address] [place], [place] [address]    Special Requests   Final    Immunocompromised Performed at [facility], [place] [address] [place], [place] [address]    Culture MULTIPLE SPECIES PRESENT, SUGGEST RECOLLECTION (A)  Final   Report Status 11/11/2018 FINAL  Final  SARS CORONAVIRUS 2 (TAT 6-24 HRS) Nasopharyngeal Nasopharyngeal Swab     Status: None   Collection Time: 11/10/18  8:05 AM   Specimen: Nasopharyngeal Swab  Result Value Ref Range Status   SARS Coronavirus 2 NEGATIVE NEGATIVE Final    Comment: (NOTE) SARS-CoV-2 target nucleic  acids are NOT DETECTED. The SARS-CoV-2 RNA is generally detectable in upper and lower respiratory specimens during the acute phase of infection. Negative results do not preclude SARS-CoV-2 infection, do not rule out co-infections with other pathogens, and should not be used as the sole basis for treatment or other patient management decisions. Negative results must be combined with clinical observations, patient history, and epidemiological information. The expected result is Negative. Fact Sheet for Patients: SugarRoll.be Fact Sheet for Healthcare Providers: https://www.woods-mathews.com/ This test is not yet approved or cleared by the [place] FDA and  has been authorized for detection and/or  diagnosis of SARS-CoV-2 by FDA under an Emergency Use Authorization (EUA). This EUA will remain  in effect (meaning this test can be used) for the duration of the COVID-19 declaration under Section 56 4(b)(1) of the Act, 21 U.S.C. section 360bbb-3(b)(1), unless the authorization is terminated or revoked sooner. Performed at [facility], [place] [address] [place], [place] [address]      Liver Function Tests: Recent Labs  Lab 11/10/18 0437  AST 11*  ALT 8  ALKPHOS 121  BILITOT 0.5  PROT 8.5*  ALBUMIN 3.3*   No results for input(s): LIPASE, AMYLASE in the last 168 hours. No results for input(s): AMMONIA in the last 168 hours.  Cardiac Enzymes: No results for input(s): CKTOTAL, CKMB, CKMBINDEX, TROPONINI in the last 168 hours. BNP (last 3 results) No results for input(s): BNP in the last 8760 hours.  ProBNP (last 3 results) No results for input(s): PROBNP in the last 8760 hours.    Studies: No results found.   Admission status: Inpatient: Based on patients clinical presentation and evaluation of above clinical data, I have made determination that patient meets Inpatient criteria at this time.  Time spent: 20 minutes  [facility] Pager [phone]. If 7PM-7AM, please contact night-coverage at [email or web address], Office  [phone]  password [email or web address]  11/12/2018, 6:15 PM  LOS: 2 days

## 2018-11-12 NOTE — Progress Notes (Addendum)
Inpatient Diabetes Program Recommendations  AACE/ADA: New Consensus Statement on Inpatient Glycemic Control (2015)  Target Ranges:  Prepandial:   less than 140 mg/dL      Peak postprandial:   less than 180 mg/dL (1-2 hours)      Critically ill patients:  140 - 180 mg/dL   Results for Timothy Madden, Timothy Madden (MRN [id number]) as of 11/12/2018 09:02  Ref. Range 11/10/2018 04:35 11/10/2018 11:56 11/10/2018 15:39 11/10/2018 22:10  Glucose-Capillary Latest Ref Range: 70 - 99 mg/dL 492 (H) 409 (H)  9 units NOVOLOG +  30 units LEVEMIR 267 (H)  5 units NOVOLOG  202 (H)   Results for Timothy Madden, Timothy Madden (MRN [id number]) as of 11/12/2018 09:02  Ref. Range 11/11/2018 07:40 11/11/2018 11:37 11/11/2018 21:13  Glucose-Capillary Latest Ref Range: 70 - 99 mg/dL 86    30 units LEVEMIR given at 10am 238 (H)  3 units NOVOLOG  202 (H)   Results for Timothy Madden, Timothy Madden (MRN [id number]) as of 11/12/2018 09:02  Ref. Range 11/12/2018 07:53  Glucose-Capillary Latest Ref Range: 70 - 99 mg/dL 143 (H)   Results for Timothy Madden, Timothy Madden (MRN [id number]) as of 11/12/2018 09:02  Ref. Range 06/29/2018 04:50 11/10/2018 11:57  Hemoglobin A1C Latest Ref Range: 4.8 - 5.6 % 14.6 (H) 14.3 (H)  (363 mg/dl)    Admit with:  R LE DVT, Leaking Foley Catheter, UTI  History: DM, CKD  Home DM Meds: Novolog 5 units TID (NOT taking)       Levemir 30 units Daily (NOT taking)  Current Orders: Levemir 30 units Daily      Novolog Sensitive Correction Scale/ SSI (0-9 units) TID AC      PCP: Marliss Coots, NP  Was supposed to follow up from hospital admission back in June of this year at the [facility] on 07/06/2018--Do not see that patient went to his appt and per home Med rec, patient NOT taking his insulin.  Current A1c of 14.3% shows continued poor glucose control at home.  CBGs better controlled in hospital now that patient getting Levemir and Novolog.    Addendum 12:45pm- Met with pt this  afternoon.  Pt told me he isn't taking his insulin at home b/c he doesn't know how to.  I questioned pt further on this as I saw through record review that pt was initially started on insulin back in 2015 and was educated on insulin back in 2015.  Pt stated "I just don't know how.  I need somebody to come to my house and give me my medicine and take care of my Mom".    Reviewed pt's current A1c of 14.3%.  Pt seemed disinterested in the info.  Unsure how to help pt at this point.  Have asked the RNs to please make sure pt able to self administer insulin and chart results.  It appears pt unwilling or unable to take better care of his health.     --Will follow patient during hospitalization--  Wyn Quaker RN, MSN, CDE Diabetes Coordinator Inpatient Glycemic Control Team Team Pager: [phone] (8a-5p)

## 2018-11-12 NOTE — TOC Initial Note (Addendum)
Transition of Care Timothy Madden) - Initial/Assessment Note    Patient Details  Name: Timothy Madden MRN: [id number] Date of Birth: [birth year]  Transition of Care Timothy Madden) CM/SW Contact:    Trish Mage, LCSW Phone Number: 11/12/2018, 3:20 PM  Clinical Narrative:  Met with patient due to high risk of readmission. He has visited the ED monthly since June, with 2 of those resulting in admissions.  At each encounter, the presentation is the same, with a chronic Foley that needs attention and uncontrolled diabetes.  He has been seen by CM and CSW multiple times, again with same results-unable to reach family for support/collateral, difficulty understanding barriers as patient is a poor historian, and inability to identify any formal supports outside of here due to lack of insurance.  When I met with Timothy Madden, he presented with tangential thought, flight of ideas.  Very difficult to follow.  He talked about the [facility] and Marliss Coots, generally walking to get around, though sometimes getting a ride from brother in law or taking RTD, living with his mother, the 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    I called the [facility] to try to determine what, if any services he is getting.  He has not been getting any medical services for over a year, and had a [facility] CM previously when he was homeless, but is no longer eligible for those services as he has permanent shelter.  He is, however, eligible for both CM and medical services through the [facility] should he chose to avail himself to those services.   Left message at two numbers listed in chart for mother.  [phone] and [phone].          Expected Discharge Plan: Home/Self Care Barriers to Discharge: No Barriers Identified   Patient Goals and CMS Choice Patient states their goals for this  hospitalization and ongoing recovery are:: "When I go to the pharmacy, they keep my bottle."      Expected Discharge Plan and Services Expected Discharge Plan: Home/Self Care   Discharge Planning Services: CM Consult   Living arrangements for the past 2 months: Apartment                                      Prior Living Arrangements/Services Living arrangements for the past 2 months: Apartment Lives with:: Parents Patient language and need for interpreter reviewed:: Yes Do you feel safe going back to the place where you live?: Yes      Need for Family Participation in Patient Care: Yes (Comment) Care giver support system in place?: Yes (comment)   Criminal Activity/Legal Involvement Pertinent to Current Situation/Hospitalization: No - Comment as needed  Activities of Daily Living Home Assistive Devices/Equipment: Gilford Rile (specify type) ADL Screening (condition at time of admission) Patient's cognitive ability adequate to safely complete daily activities?: Yes Is the patient deaf or have difficulty hearing?: No Does the patient have difficulty seeing, even when wearing glasses/contacts?: No Does the patient have difficulty concentrating, remembering, or making decisions?: No Patient able to express need for assistance with ADLs?: No Does the patient have difficulty dressing or bathing?: No Independently performs ADLs?: Yes (appropriate for developmental age) Does the patient have difficulty walking or climbing stairs?: No Weakness of  Legs: Both Weakness of Arms/Hands: Both  Permission Sought/Granted Permission sought to share information with : Family Supports Permission granted to share information with : Yes, Verbal Permission Granted  Share Information with NAME: Timothy Madden     Permission granted to share info w Relationship: mother  Permission granted to share info w Contact Information: [phone], [phone]  Emotional Assessment Appearance:: Appears stated  age Attitude/Demeanor/Rapport: Engaged, Other (comment)(jumbled thought process) Affect (typically observed): Appropriate Orientation: : Oriented to Self, Oriented to Place Alcohol / Substance Use: Not Applicable Psych Involvement: No (comment)  Admission diagnosis:  Acute cystitis without hematuria [N30.00] Pain and swelling of right lower leg [M79.661, M79.89] Hyperglycemia due to diabetes mellitus ([place]) [E11.65] Patient Active Problem List   Diagnosis Date Noted  . Leg swelling 11/10/2018  . DKA, type 2 ([place]) 06/28/2018  . Acute renal failure superimposed on chronic kidney disease ([place]) 06/28/2018  . Elevated lipase 06/28/2018  . Osteomyelitis of finger of left hand ([place]) 10/26/2013  . Osteomyelitis ([place]) 10/26/2013  . Sepsis ([place]) 10/26/2013  . AKI (acute kidney injury) ([place]) 10/26/2013  . Hyponatremia 10/26/2013  . Type I diabetes mellitus with complication, uncontrolled ([place]) 10/26/2013  . Essential hypertension 10/26/2013  . Anemia of chronic disease 10/26/2013  . PSYCHIATRIC DISORDER 07/19/2006  . TOBACCO ABUSE 07/19/2006  . DIABETES MELLITUS, TYPE II 11/30/2005  . ANXIETY 11/30/2005   PCP:  Marliss Coots, NP Pharmacy:   [facility] ([place]), [place] - [address] [place] ([place] [address] Phone: [phone] Fax: [phone]     Social Determinants of Health (SDOH) Interventions    Readmission Risk Interventions No flowsheet data found.

## 2018-11-13 DIAGNOSIS — I82432 Acute embolism and thrombosis of left popliteal vein: Secondary | ICD-10-CM

## 2018-11-13 LAB — GLUCOSE, CAPILLARY
Glucose-Capillary: 107 mg/dL — ABNORMAL HIGH (ref 70–99)
Glucose-Capillary: 192 mg/dL — ABNORMAL HIGH (ref 70–99)
Glucose-Capillary: 148 mg/dL — ABNORMAL HIGH (ref 70–99)
Glucose-Capillary: 126 mg/dL — ABNORMAL HIGH (ref 70–99)
Glucose-Capillary: 180 mg/dL — ABNORMAL HIGH (ref 70–99)

## 2018-11-13 LAB — CREATININE, SERUM
Creatinine, Ser: 2.4 mg/dL — ABNORMAL HIGH (ref 0.61–1.24)
GFR calc Af Amer: 33 mL/min — ABNORMAL LOW (ref >60)
GFR calc non Af Amer: 29 mL/min — ABNORMAL LOW (ref >60)

## 2018-11-13 MED ORDER — RIVAROXABAN 20 MG PO TABS: 20.0000 mg | ORAL_TABLET | Freq: Every day | ORAL | Status: DC

## 2018-11-13 MED ORDER — RIVAROXABAN 15 MG PO TABS
15.0000 mg | ORAL_TABLET | Freq: Two times a day (BID) | ORAL | Status: DC
  Administered 2018-11-13 – 2018-11-15 (×4): 15 mg via ORAL
  Filled 2018-11-13 (×4): qty 1

## 2018-11-13 MED ORDER — INSULIN DETEMIR 100 UNIT/ML ~~LOC~~ SOLN
32.0000 [IU] | Freq: Every day | SUBCUTANEOUS | Status: DC
  Administered 2018-11-14 – 2018-11-15 (×2): 32 [IU] via SUBCUTANEOUS
  Filled 2018-11-13 (×2): qty 0.32

## 2018-11-13 NOTE — Progress Notes (Signed)
Inpatient Diabetes Program Recommendations  AACE/ADA: New Consensus Statement on Inpatient Glycemic Control (2015)  Target Ranges:  Prepandial:   less than 140 mg/dL      Peak postprandial:   less than 180 mg/dL (1-2 hours)      Critically ill patients:  140 - 180 mg/dL   Results for Timothy Madden, Timothy Madden (MRN [id number]) as of 11/13/2018 07:44  Ref. Range 11/12/2018 07:53 11/12/2018 11:33 11/12/2018 16:54 11/12/2018 20:42  Glucose-Capillary Latest Ref Range: 70 - 99 mg/dL 143 (H)  1 unit NOVOLOG +  30 units LEVEMIR  213 (H)  3 units NOVOLOG  112 (H) 249 (H)   Results for Timothy Madden, Timothy Madden (MRN [id number]) as of 11/13/2018 07:44  Ref. Range 11/13/2018 07:38  Glucose-Capillary Latest Ref Range: 70 - 99 mg/dL 192 (H)   Results for Timothy Madden, Timothy Madden (MRN [id number]) as of 11/12/2018 09:02  Ref. Range 06/29/2018 04:50 11/10/2018 11:57  Hemoglobin A1C Latest Ref Range: 4.8 - 5.6 % 14.6 (H) 14.3 (H)  (363 mg/dl)     Admit with:  R LE DVT, Leaking Foley Catheter, UTI  History: DM, CKD  Home DM Meds: Novolog 5 units TID (NOT taking)                             Levemir 30 units Daily (NOT taking)  Current Orders: Levemir 30 units Daily                            Novolog Sensitive Correction Scale/ SSI (0-9 units) TID AC     PCP: Marliss Coots, NP   Was supposed to follow up from hospital admission back in June of this year at the [facility] on 07/06/2018--Do not see that patient went to his appt and per home Med rec, patient NOT taking his insulin.  Current A1c of 14.3% shows continued poor glucose control at home.   Met with pt yesterday afternoon.  See Diabetes Coordinator note from 11/02.  Unsure how to help pt at this point.  Have asked the RNs to please make sure pt able to self administer insulin and chart results.  It appears pt unwilling or unable to take better care of his health.    MD- Please consider the following in-hospital insulin  adjustments:  1. Increase Levemir slightly to 32 units Daily  2. Start low dose Novolog Meal Coverage: Novolog 2 units TID with meals  (Please add the following Hold Parameters: Hold if pt eats <50% of meal, Hold if pt NPO)     --Will follow patient during hospitalization--  Wyn Quaker RN, MSN, CDE Diabetes Coordinator Inpatient Glycemic Control Team Team Pager: [phone] (8a-5p)

## 2018-11-13 NOTE — TOC Progression Note (Addendum)
Transition of Care [facility]) - Progression Note    Patient Details  Name: Timothy Madden MRN: [id number] Date of Birth: [birth year]  Transition of Care [facility]) CM/SW [place], [place] Phone Number: 11/13/2018, 9:32 AM  Clinical Narrative:   [name] was able to recite Aunt's number yesterday when I told him no one was answering at home.  Spoke to Ms Spinx this AM, who told me I needed to talk to mother or sister [place] about making sure he was getting medical help.  She gave me a non-working number to try.  When I tried to call her back for any additional help or numbers, there was no answer. At this point, I will call Adult Protective Services to make a report. APS report filed. APS case accepted.  Assigned to [employer].   Expected Discharge Plan: Home/Self Care Barriers to Discharge: No Barriers Identified  Expected Discharge Plan and Services Expected Discharge Plan: Home/Self Care   Discharge Planning Services: CM Consult   Living arrangements for the past 2 months: Apartment                                       Social Determinants of Health (SDOH) Interventions    Readmission Risk Interventions No flowsheet data found.

## 2018-11-13 NOTE — Progress Notes (Signed)
ANTICOAGULATION CONSULT NOTE   Pharmacy Consult for lovenox Indication: acute DVT  No Known Allergies  Patient Measurements: Height: 6\' 3"  (190.5 cm) Weight: 195 lb (88.5 kg) IBW/kg (Calculated) : 84.5 Heparin Dosing Weight:   Vital Signs: Temp: 97.8 F (36.6 C) (11/03 0612) Temp Source: Oral (11/03 0612) BP: 178/111 (11/03 0612) Pulse Rate: 92 (11/03 0612)  Labs: Recent Labs    11/10/18 1157  11/11/18 0525 11/11/18 2003 11/12/18 0549 11/13/18 0455  HGB 11.3*  --   --  11.8* 12.7*  --   HCT 36.0*  --   --  37.0* 40.1  --   PLT 357  --   --  364 391  --   CREATININE 2.56*   < > 2.50*  --  2.49* 2.40*   < > = values in this interval not displayed.    Estimated Creatinine Clearance: 40.6 mL/min (A) (by C-G formula based on SCr of 2.4 mg/dL (H)).   Assessment: Patient is a 58 y.o M with hx upper thigh wound, DM, and CKD presented to the ED on 10/31 with c/o LE swelling/pain.  LE doppler showed acute RLE DVT.  Pharmacy is consulted to start lovenox for acute DVT. Ddimer elevated on admit  Today, 11/13/2018: - cbc ok (11/2) - SCr elevated but decreasing - no bleeding documented - SW for options for oral anti-coag  Goal of Therapy:  Anti-Xa level 0.6-1 units/ml 4hrs after LMWH dose given Monitor platelets by anticoagulation protocol: Yes   Plan:  - Lovenox 90 mg SQ q12h - monitor renal function closely - cbc q72h - monitor for s/s bleeding  Minda Ditto PharmD 11/13/2018,8:27 AM

## 2018-11-13 NOTE — Progress Notes (Signed)
[place] for Xarelto Indication: acute DVT  No Known Allergies  Patient Measurements: Height: 6\' 3"  (190.5 cm) Weight: 195 lb (88.5 kg) IBW/kg (Calculated) : 84.5 Heparin Dosing Weight:   Vital Signs: Temp: 98.2 F (36.8 C) (11/03 1256) Temp Source: Oral (11/03 1256) BP: 110/79 (11/03 1256) Pulse Rate: 95 (11/03 1256)  Labs: Recent Labs    11/11/18 0525 11/11/18 2003 11/12/18 0549 11/13/18 0455  HGB  --  11.8* 12.7*  --   HCT  --  37.0* 40.1  --   PLT  --  364 391  --   CREATININE 2.50*  --  2.49* 2.40*   Estimated Creatinine Clearance: 40.6 mL/min (A) (by C-G formula based on SCr of 2.4 mg/dL (H)).  Assessment: Patient is a 58 y.o M with hx upper thigh wound, DM, and CKD presented to the ED on 10/31 with c/o LE swelling/pain.  LE doppler showed acute RLE DVT.  Pharmacy is consulted to start lovenox for acute DVT. D-dimer elevated on admission  Today, 11/13/2018: - scr 2.40 (crcl~40) - cbc ok - no bleeding documented - Lovenox to Xarelto today, Xarelto chosen as can be given daily after first 3 wk. Clearance improved  Goal of Therapy:  Anti-Xa level 0.6-1 units/ml 4hrs after LMWH dose given Monitor platelets by anticoagulation protocol: Yes   Plan:  Xarelto 15mg  twice a day with meals x 21 days, on 11/25 dose reduced to 20mg  daily with a meal Educated patient on Xarelto dose/side effects, 30 day card given Have doubts about patient's ability to manage medications for DVT  Minda Ditto PharmD 11/13/2018,12:59 PM

## 2018-11-13 NOTE — Discharge Instructions (Signed)
Information on my medicine - XARELTO (rivaroxaban)  This medication education was reviewed with me or my healthcare representative as part of my discharge preparation.  The pharmacist that spoke with me during my hospital stay was:  Minda Ditto, [place]? Xarelto was prescribed to treat blood clots that may have been found in the veins of your legs (deep vein thrombosis) or in your lungs (pulmonary embolism) and to reduce the risk of them occurring again.  What do you need to know about Xarelto? The starting dose is one 15 mg tablet taken TWICE daily with food for the FIRST 21 DAYS then on (enter date)  11/25  the dose is changed to one 20 mg tablet taken ONCE A DAY with breakfast or your evening meal.  DO NOT stop taking Xarelto without talking to the health care provider who prescribed the medication.  Refill your prescription for 20 mg tablets before you run out.  After discharge, you should have regular check-up appointments with your healthcare provider that is prescribing your Xarelto.  In the future your dose may need to be changed if your kidney function changes by a significant amount.  What do you do if you miss a dose? If you are taking Xarelto TWICE DAILY and you miss a dose, take it as soon as you remember. You may take two 15 mg tablets (total 30 mg) at the same time then resume your regularly scheduled 15 mg twice daily the next day.  If you are taking Xarelto ONCE DAILY and you miss a dose, take it as soon as you remember on the same day then continue your regularly scheduled once daily regimen the next day. Do not take two doses of Xarelto at the same time.   Important Safety Information Xarelto is a blood thinner medicine that can cause bleeding. You should call your healthcare provider right away if you experience any of the following: ? Bleeding from an injury or your nose that does not stop. ? Unusual colored urine (red or dark  brown) or unusual colored stools (red or black). ? Unusual bruising for unknown reasons. ? A serious fall or if you hit your head (even if there is no bleeding).  Some medicines may interact with Xarelto and might increase your risk of bleeding while on Xarelto. To help avoid this, consult your healthcare provider or pharmacist prior to using any new prescription or non-prescription medications, including herbals, vitamins, non-steroidal anti-inflammatory drugs (NSAIDs) and supplements.  This website has more information on Xarelto: [email or web address]://guerra-benson.[email or web address]

## 2018-11-13 NOTE — Progress Notes (Addendum)
[facility]  PROGRESS NOTE  Timothy Madden:[id number] DOB: 02-Jan-1961 DOA: 11/10/2018 PCP: Marliss Coots, NP   Brief HPI:   58 year old male with a history of diabetes mellitus type 2, CKD stage III, osteomyelitis who came to ED with complaints of right leg swelling.  Patient found to have DVT.  Started on Lovenox.  Also has history of UTI urine retention, chronic indwelling Foley catheter was placed on July 8.  Urology was consulted yesterday.    Subjective   Patient seen and examined, Foley catheter was removed yesterday.   Assessment/Plan:     1. DVT-venous duplex of lower extremity positive for popliteal and tibial vein DVT.  Patient was started on Lovenox per pharmacy, at this time Lovenox has been discontinued and started on Xarelto.  He will need assistance with medication at the time of discharge.   2. UTI/urine retention-patient has chronic indwelling Foley catheter, started on ceftriaxone for UTI.  Urine culture growing multiple species, will discontinue ceftriaxone at this time.  Urology was consulted, Foley catheter has been discontinued.  Continue with tamsulosin twice a day.  Patient will follow up with urology as outpatient.  3.   Abnormal renal ultrasound-renal ultrasound in July 2020 showed questionable renal mass-urology ordered CT abdomen without contrast, which showed ectopic insertion of the ureters bilaterally into better which are more superior and lateral than typical.  Which could result in dysfunctional ureteral vascular junction and reflux and hydronephrosis.  No masses or stones identified.  Discussed CT results with Dr. Tresa Moore, who feels patient can be discharged and follow-up with urology as outpatient.  4. Bilateral hydronephrosis-because as above.  Patient to follow-up with urology as outpatient.  5. Chronic kidney disease stage III-creatinine 2.49, at baseline.  6. Diabetes mellitus-glucose well controlled, will change Levemir to 32 units  subcu daily.  Sliding scale insulin with NovoLog.  7. Poor living situation/depression/?  Underlying thought disorder-as per clinical social work patient has poor living condition, has not been taking his medications as he was supposed to.  Adult Protective Services has been involved.  Will consult psychiatry for underlying depression or thought disorder.       CBG: Recent Labs  Lab 11/12/18 1133 11/12/18 1654 11/12/18 2042 11/13/18 0738 11/13/18 1123  GLUCAP 213* 112* 249* 192* 148*    CBC: Recent Labs  Lab 11/10/18 0437 11/10/18 1157 11/11/18 2003 11/12/18 0549  WBC 13.0* 14.1* 10.5 11.4*  NEUTROABS 9.2*  --   --   --   HGB 12.7* 11.3* 11.8* 12.7*  HCT 40.7 36.0* 37.0* 40.1  MCV 84.8 84.1 83.0 83.9  PLT 331 357 364 563    Basic Metabolic Panel: Recent Labs  Lab 11/10/18 0437 11/10/18 1157 11/10/18 1228 11/11/18 0525 11/12/18 0549 11/13/18 0455  NA 131*  --  133*  --  137  --   K 4.3  --  4.7  --  4.4  --   CL 98  --  102  --  106  --   CO2 23  --  23  --  21*  --   GLUCOSE 465*  --  389*  --  130*  --   BUN 34*  --  30*  --  28*  --   CREATININE 2.76* 2.56* 2.53* 2.50* 2.49* 2.40*  CALCIUM 9.3  --  8.6*  --  9.2  --      DVT prophylaxis: Patient is on Lovenox full dose  Code Status: Full code  Family Communication:  No family at bedside  Disposition Plan: likely home when medically ready for discharge Pressure Injury 06/28/18 Foot Right black thicken area on ball of foot (Active)  06/28/18 1732  Location: Foot (ball of right foot)  Location Orientation: Right  Staging:   Wound Description (Comments): black thicken area on ball of foot  Present on Admission: Yes        BMI  Estimated body mass index is 24.37 kg/m as calculated from the following:   Height as of this encounter: 6\' 3"  (1.905 m).   Weight as of this encounter: 88.5 kg.  Scheduled medications:  . Chlorhexidine Gluconate Cloth  6 each Topical Daily  . insulin aspart  0-9  Units Subcutaneous TID WC  . insulin detemir  30 Units Subcutaneous Daily  . rivaroxaban  15 mg Oral BID WC   Followed by  . [START ON 12/05/2018] rivaroxaban  20 mg Oral Q breakfast  . tamsulosin  0.4 mg Oral BID    Consultants:  Urology  Procedures:  Lower extremity venous duplex   Antibiotics:   Anti-infectives (From admission, onward)   Start     Dose/Rate Route Frequency Ordered Stop   11/11/18 0900  cefTRIAXone (ROCEPHIN) 1 g in sodium chloride 0.9 % 100 mL IVPB  Status:  Discontinued     1 g 200 mL/hr over 30 Minutes Intravenous Every 24 hours 11/10/18 1605 11/13/18 0826   11/10/18 0730  cefTRIAXone (ROCEPHIN) 1 g in sodium chloride 0.9 % 100 mL IVPB     1 g 200 mL/hr over 30 Minutes Intravenous  Once 11/10/18 0723 11/10/18 1025       Objective   Vitals:   11/12/18 1255 11/12/18 2045 11/13/18 0612 11/13/18 1256  BP: 118/83 (!) 141/96 (!) 178/111 110/79  Pulse: 97 93 92 95  Resp: 16 17 17 17   Temp: 98.3 F (36.8 C) 98.6 F (37 C) 97.8 F (36.6 C) 98.2 F (36.8 C)  TempSrc: Oral Oral Oral Oral  SpO2: 98% 98% 100% 98%  Weight:      Height:        Intake/Output Summary (Last 24 hours) at 11/13/2018 1554 Last data filed at 11/13/2018 1327 Gross per 24 hour  Intake 720 ml  Output 950 ml  Net -230 ml   Filed Weights   11/10/18 0022  Weight: 88.5 kg     Physical Examination:  General-appears in no acute distress Heart-S1-S2, regular, no murmur auscultated Lungs-clear to auscultation bilaterally, no wheezing or crackles auscultated Abdomen-soft, nontender, no organomegaly Extremities-no edema in the lower extremities Neuro-alert, oriented x3, no focal deficit noted     Data Reviewed: I have personally reviewed following labs and imaging studies   Recent Results (from the past 240 hour(s))  Urine culture     Status: Abnormal   Collection Time: 11/10/18  5:35 AM   Specimen: Urine, Catheterized  Result Value Ref Range Status   Specimen  Description   Final    URINE, CATHETERIZED Performed at [facility], [address], [place], [place] [address]    Special Requests   Final    Immunocompromised Performed at [facility], [place] [address] [place], [place] [address]    Culture MULTIPLE SPECIES PRESENT, SUGGEST RECOLLECTION (A)  Final   Report Status 11/11/2018 FINAL  Final  SARS CORONAVIRUS 2 (TAT 6-24 HRS) Nasopharyngeal Nasopharyngeal Swab     Status: None   Collection Time: 11/10/18  8:05 AM   Specimen: Nasopharyngeal Swab  Result  Value Ref Range Status   SARS Coronavirus 2 NEGATIVE NEGATIVE Final    Comment: (NOTE) SARS-CoV-2 target nucleic acids are NOT DETECTED. The SARS-CoV-2 RNA is generally detectable in upper and lower respiratory specimens during the acute phase of infection. Negative results do not preclude SARS-CoV-2 infection, do not rule out co-infections with other pathogens, and should not be used as the sole basis for treatment or other patient management decisions. Negative results must be combined with clinical observations, patient history, and epidemiological information. The expected result is Negative. Fact Sheet for Patients: SugarRoll.be Fact Sheet for Healthcare Providers: https://www.woods-mathews.com/ This test is not yet approved or cleared by the [place] FDA and  has been authorized for detection and/or diagnosis of SARS-CoV-2 by FDA under an Emergency Use Authorization (EUA). This EUA will remain  in effect (meaning this test can be used) for the duration of the COVID-19 declaration under Section 56 4(b)(1) of the Act, 21 U.S.C. section 360bbb-3(b)(1), unless the authorization is terminated or revoked sooner. Performed at [facility], [place] [address], [place], [place] [address]      Liver Function Tests: Recent Labs  Lab 11/10/18 0437  AST 11*  ALT 8  ALKPHOS 121  BILITOT 0.5  PROT  8.5*  ALBUMIN 3.3*   No results for input(s): LIPASE, AMYLASE in the last 168 hours. No results for input(s): AMMONIA in the last 168 hours.  Cardiac Enzymes: No results for input(s): CKTOTAL, CKMB, CKMBINDEX, TROPONINI in the last 168 hours. BNP (last 3 results) No results for input(s): BNP in the last 8760 hours.  ProBNP (last 3 results) No results for input(s): PROBNP in the last 8760 hours.    Studies: No results found.   Admission status: Inpatient: Based on patients clinical presentation and evaluation of above clinical data, I have made determination that patient meets Inpatient criteria at this time.  Time spent: 20 minutes  [facility] Pager [phone]. If 7PM-7AM, please contact night-coverage at [email or web address], Office  [phone]  password [email or web address]  11/13/2018, 3:54 PM  LOS: 3 days

## 2018-11-14 DIAGNOSIS — N1831 Chronic kidney disease, stage 3a: Secondary | ICD-10-CM

## 2018-11-14 DIAGNOSIS — R4589 Other symptoms and signs involving emotional state: Secondary | ICD-10-CM | POA: Diagnosis present

## 2018-11-14 DIAGNOSIS — F430 Acute stress reaction: Secondary | ICD-10-CM | POA: Diagnosis present

## 2018-11-14 DIAGNOSIS — I8290 Acute embolism and thrombosis of unspecified vein: Secondary | ICD-10-CM

## 2018-11-14 DIAGNOSIS — N390 Urinary tract infection, site not specified: Secondary | ICD-10-CM

## 2018-11-14 LAB — GLUCOSE, CAPILLARY
Glucose-Capillary: 172 mg/dL — ABNORMAL HIGH (ref 70–99)
Glucose-Capillary: 311 mg/dL — ABNORMAL HIGH (ref 70–99)
Glucose-Capillary: 182 mg/dL — ABNORMAL HIGH (ref 70–99)
Glucose-Capillary: 131 mg/dL — ABNORMAL HIGH (ref 70–99)

## 2018-11-14 MED ORDER — INFLUENZA VAC SPLIT QUAD 0.5 ML IM SUSY
0.5000 mL | PREFILLED_SYRINGE | INTRAMUSCULAR | Status: AC
  Administered 2018-11-15: 0.5 mL via INTRAMUSCULAR
  Filled 2018-11-14: qty 0.5

## 2018-11-14 NOTE — Progress Notes (Signed)
[facility]  PROGRESS NOTE  Timothy Madden BPZ:[id number] DOB: February 04, 1960 DOA: 11/10/2018 PCP: Marliss Coots, NP   Brief HPI:   58 year old male with a history of diabetes mellitus type 2, CKD stage III, osteomyelitis who came to ED with complaints of right leg swelling.  Patient found to have DVT.  Started on Lovenox.  Also has history of UTI urine retention, chronic indwelling Foley catheter was placed on July 8.  Urology was consulted yesterday.  Subjective   Patient seen and examined denies any complaint this morning. Discussed the need for anticoagulation. Discussed with pharmacist, although his GFR is elevated but preferred Xarelto because of the daily dosing secondary to previous history of nonadherence   Assessment/Plan:    DVT-venous duplex of lower extremity positive for popliteal and tibial vein DVT.  Patient was started on Lovenox per pharmacy, at this time Lovenox has been discontinued and started on Xarelto.  He will need assistance with medication at the time of discharge.   UTI/urine retention-patient has chronic indwelling Foley catheter, started on ceftriaxone for UTI.  Urine culture growing multiple species, will discontinue ceftriaxone at this time.  Urology was consulted, Foley catheter has been removed. Patient was able to urinate without problem, continue tamsulosin  Abnormal renal ultrasound-renal ultrasound in July 2020 showed questionable renal mass-urology ordered CT abdomen without contrast, which showed ectopic insertion of the ureters bilaterally into better which are more superior and lateral than typical.  Which could result in dysfunctional ureteral vascular junction and reflux and hydronephrosis.  No masses or stones identified.  Discussed CT results with Dr. Tresa Moore, who feels patient can be discharged and follow-up with urology as outpatient.  Bilateral hydronephrosis Because as above.  Patient to follow-up with urology as outpatient.  Chronic  kidney disease stage III-creatinine 2.49, at baseline.  Diabetes mellitus-glucose well controlled, will change Levemir to 32 units subcu daily.  Sliding scale insulin with NovoLog.  Poor living situation/depression/?  Underlying thought disorder-as per clinical social work patient has poor living condition, has not been taking his medications as he was supposed to.  Adult Protective Services has been involved.   Will consult psychiatry for underlying depression or thought disorder. Awaiting psychiatry recommendation   CBG: Recent Labs  Lab 11/13/18 1123 11/13/18 1717 11/13/18 2112 11/14/18 0721 11/14/18 1139  GLUCAP 148* 126* 180* 172* 311*    CBC: Recent Labs  Lab 11/10/18 0437 11/10/18 1157 11/11/18 2003 11/12/18 0549  WBC 13.0* 14.1* 10.5 11.4*  NEUTROABS 9.2*  --   --   --   HGB 12.7* 11.3* 11.8* 12.7*  HCT 40.7 36.0* 37.0* 40.1  MCV 84.8 84.1 83.0 83.9  PLT 331 357 364 242    Basic Metabolic Panel: Recent Labs  Lab 11/10/18 0437 11/10/18 1157 11/10/18 1228 11/11/18 0525 11/12/18 0549 11/13/18 0455  NA 131*  --  133*  --  137  --   K 4.3  --  4.7  --  4.4  --   CL 98  --  102  --  106  --   CO2 23  --  23  --  21*  --   GLUCOSE 465*  --  389*  --  130*  --   BUN 34*  --  30*  --  28*  --   CREATININE 2.76* 2.56* 2.53* 2.50* 2.49* 2.40*  CALCIUM 9.3  --  8.6*  --  9.2  --      DVT prophylaxis: Patient is on Lovenox full  dose  Code Status: Full code  Family Communication: No family at bedside  Disposition Plan: likely home when medically ready for discharge Pressure Injury 06/28/18 Foot Right black thicken area on ball of foot (Active)  06/28/18 1732  Location: Foot (ball of right foot)  Location Orientation: Right  Staging:   Wound Description (Comments): black thicken area on ball of foot  Present on Admission: Yes        BMI  Estimated body mass index is 24.37 kg/m as calculated from the following:   Height as of this encounter: 6'  3" (1.905 m).   Weight as of this encounter: 88.5 kg.  Scheduled medications:  . Chlorhexidine Gluconate Cloth  6 each Topical Daily  . [START ON 11/15/2018] influenza vac split quadrivalent PF  0.5 mL Intramuscular Tomorrow-1000  . insulin aspart  0-9 Units Subcutaneous TID WC  . insulin detemir  32 Units Subcutaneous Daily  . rivaroxaban  15 mg Oral BID WC   Followed by  . [START ON 12/05/2018] rivaroxaban  20 mg Oral Q breakfast  . tamsulosin  0.4 mg Oral BID    Consultants:  Urology  Procedures:  Lower extremity venous duplex   Antibiotics:   Anti-infectives (From admission, onward)   Start     Dose/Rate Route Frequency Ordered Stop   11/11/18 0900  cefTRIAXone (ROCEPHIN) 1 g in sodium chloride 0.9 % 100 mL IVPB  Status:  Discontinued     1 g 200 mL/hr over 30 Minutes Intravenous Every 24 hours 11/10/18 1605 11/13/18 0826   11/10/18 0730  cefTRIAXone (ROCEPHIN) 1 g in sodium chloride 0.9 % 100 mL IVPB     1 g 200 mL/hr over 30 Minutes Intravenous  Once 11/10/18 0723 11/10/18 1025       Objective   Vitals:   11/13/18 0612 11/13/18 1256 11/13/18 2110 11/14/18 0510  BP: (!) 178/111 110/79 (!) 137/95 (!) 156/89  Pulse: 92 95 99 95  Resp: 17 17 18 18   Temp: 97.8 F (36.6 C) 98.2 F (36.8 C) 98.6 F (37 C) 98.2 F (36.8 C)  TempSrc: Oral Oral Oral Oral  SpO2: 100% 98% 98% 100%  Weight:      Height:        Intake/Output Summary (Last 24 hours) at 11/14/2018 1315 Last data filed at 11/14/2018 1046 Gross per 24 hour  Intake 720 ml  Output 2000 ml  Net -1280 ml   Filed Weights   11/10/18 0022  Weight: 88.5 kg     Physical Examination:  General-appears in no acute distress Heart-S1-S2, regular, no murmur auscultated Lungs-clear to auscultation bilaterally, no wheezing or crackles auscultated Abdomen-soft, nontender, no organomegaly Extremities-no edema in the lower extremities Neuro-alert, oriented x3, no focal deficit noted     Data Reviewed: I  have personally reviewed following labs and imaging studies   Recent Results (from the past 240 hour(s))  Urine culture     Status: Abnormal   Collection Time: 11/10/18  5:35 AM   Specimen: Urine, Catheterized  Result Value Ref Range Status   Specimen Description   Final    URINE, CATHETERIZED Performed at [facility], [address] [place], [place] [address]    Special Requests   Final    Immunocompromised Performed at [facility], [place] [address], [place], [place] [address]    Culture MULTIPLE SPECIES PRESENT, SUGGEST RECOLLECTION (A)  Final   Report Status 11/11/2018 FINAL  Final  SARS CORONAVIRUS 2 (TAT 6-24  HRS) Nasopharyngeal Nasopharyngeal Swab     Status: None   Collection Time: 11/10/18  8:05 AM   Specimen: Nasopharyngeal Swab  Result Value Ref Range Status   SARS Coronavirus 2 NEGATIVE NEGATIVE Final    Comment: (NOTE) SARS-CoV-2 target nucleic acids are NOT DETECTED. The SARS-CoV-2 RNA is generally detectable in upper and lower respiratory specimens during the acute phase of infection. Negative results do not preclude SARS-CoV-2 infection, do not rule out co-infections with other pathogens, and should not be used as the sole basis for treatment or other patient management decisions. Negative results must be combined with clinical observations, patient history, and epidemiological information. The expected result is Negative. Fact Sheet for Patients: SugarRoll.be Fact Sheet for Healthcare Providers: https://www.woods-mathews.com/ This test is not yet approved or cleared by the [place] FDA and  has been authorized for detection and/or diagnosis of SARS-CoV-2 by FDA under an Emergency Use Authorization (EUA). This EUA will remain  in effect (meaning this test can be used) for the duration of the COVID-19 declaration under Section 56 4(b)(1) of the Act, 21 U.S.C. section  360bbb-3(b)(1), unless the authorization is terminated or revoked sooner. Performed at [facility], [place] [address], [place], [place] [address]      Liver Function Tests: Recent Labs  Lab 11/10/18 0437  AST 11*  ALT 8  ALKPHOS 121  BILITOT 0.5  PROT 8.5*  ALBUMIN 3.3*   No results for input(s): LIPASE, AMYLASE in the last 168 hours. No results for input(s): AMMONIA in the last 168 hours.  Cardiac Enzymes: No results for input(s): CKTOTAL, CKMB, CKMBINDEX, TROPONINI in the last 168 hours. BNP (last 3 results) No results for input(s): BNP in the last 8760 hours.  ProBNP (last 3 results) No results for input(s): PROBNP in the last 8760 hours.    Studies: No results found.   Admission status: Inpatient: Based on patients clinical presentation and evaluation of above clinical data, I have made determination that patient meets Inpatient criteria at this time.  Time spent: 20 minutes  [facility] Pager [phone]. If 7PM-7AM, please contact night-coverage at [email or web address], Office  [phone]  password [email or web address]  11/14/2018, 1:15 PM  LOS: 4 days

## 2018-11-14 NOTE — Consult Note (Addendum)
Telepsych Consultation   Reason for Consult: Poor Living situation and underlying depression  Referring Physician:  Dr. Hartford Poli Location of Patient:  Location of Provider: [place] Department  Patient Identification: Timothy Madden MRN:  [id number] Principal Diagnosis: Acute stress disorder Diagnosis:  Principal Problem:   Acute stress disorder Active Problems:   Leg swelling   Flat affect   Total Time spent with patient: 30 minutes  Subjective:   Timothy Madden is a 58 y.o. male patient admitted with history of diabetes mellitus, CKD, osteomyelitis who presented to the ED with complaints of right leg swelling. During his admission medical care team became concerned about any underlying mental disorder due to his ongoing poor living situations and presentations to the ED.   Upon speaking with the patient he is alert and oriented. He is able to engage well with the writer however it should be noted that he has a flat and blunted affect. He denies any previous psych diagnosis. He states his current mood is "alright", and this is normally how he presents. He denies any depressive symptoms to include depression, sadness, withdrawn, hopeless, worthless, recurrent thoughts of death, decrease appetite, sleep disturbances, and or suicidal thoughts. He denies any suicidal thoughts, gestures, or threats in the recent or past. He does report one isolated episode of a hospital admission, which he reports is due to alcohol intoxication. He denies any previous or current psychotropic emdications. He reports his greatest need is needing assistance with home health, SSDI, and clothing voucher. He currently resides with his mother, and has been doing so for the past one month. He denies any spouse, and has three children. He is requesting assistance with his disability paperwork. Of note there is an ER visit for medical disability. 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 He  denies any underlying depressive symptoms, mania, psychosis, bizarre behaviors, or trauma. At this time patient is psych cleared.   HPI:   Met with patient due to high risk of readmission. He has visited the ED monthly since June, with 2 of those resulting in admissions.  At each encounter, the presentation is the same, with a chronic Foley that needs attention and uncontrolled diabetes.  He has been seen by CM and CSW multiple times, again with same results-unable to reach family for support/collateral, difficulty understanding barriers as patient is a poor historian, and inability to identify any formal supports outside of here due to lack of insurance.  When I met with Timothy Madden, he presented with tangential thought, flight of ideas.  Very difficult to follow.  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    I called the [facility] to try to determine what, if any services he is getting.  He has not been getting any medical services for over a year, and had a [facility] CM previously when he was homeless, but is no longer eligible for those services as he has permanent shelter.  He is, however, eligible for both CM and medical services through the [facility] should he chose to avail himself to those services.    Past Psychiatric History: See above  Risk to Self:  Denies Risk to Others:  Denies Prior Inpatient Therapy:  x1 due to alcohol intoxication Prior Outpatient Therapy:  Denies  Past Medical History:  Past Medical History:  Diagnosis Date  . Amputation of finger, left   . Complication of anesthesia   . Diabetes mellitus without complication ([place])   . DKA (diabetic  ketoacidoses) ([place]) 06/2018  . PONV (postoperative nausea and vomiting)     Past Surgical History:  Procedure Laterality Date  . I&D EXTREMITY Left 10/26/2013   Procedure: IRRIGATION AND DEBRIDEMENT EXTREMITY,PARTIAL AMPUTATION LEFT INDEX FINGER;  Surgeon: Dayna Barker, MD;  Location: [place];  Service: Plastics;  Laterality: Left;  . I&D EXTREMITY Left 10/30/2013   Procedure: IRRIGATION AND DEBRIDEMENT AND REVISION AMPUTATION OF LEFT INDEX FINGER;  Surgeon: Dayna Barker, MD;  Location: [place];  Service: Plastics;  Laterality: Left;   Family History:  Family History  Problem Relation Age of Onset  . Hypertension Mother   . Diabetes Father   . Diabetes Sister   . Diabetes Brother   . Heart disease Brother    Family Psychiatric  History: As per patient he denies.  Social History:  Social History   Substance and Sexual Activity  Alcohol Use No  . Alcohol/week: 0.0 standard drinks     Social History   Substance and Sexual Activity  Drug Use No    Social History   Socioeconomic History  . Marital status: Legally Separated    Spouse name: Not on file  . Number of children: Not on file  . Years of education: Not on file  . Highest education level: Not on file  Occupational History  . Not on file  Social Needs  . Financial resource strain: Not on file  . Food insecurity    Worry: Not on file    Inability: Not on file  . Transportation needs    Medical: Not on file    Non-medical: Not on file  Tobacco Use  . Smoking status: Current Every Day Smoker    Packs/day: 1.00    Years: 20.00    Pack years: 20.00    Types: Cigarettes  . Smokeless tobacco: Never Used  Substance and Sexual Activity  . Alcohol use: No    Alcohol/week: 0.0 standard drinks  . Drug use: No  . Sexual activity: Not on file  Lifestyle  . Physical activity    Days per week: Not on file    Minutes per session: Not on file  . Stress: Not on file  Relationships  . Social Herbalist on  phone: Not on file    Gets together: Not on file    Attends religious service: Not on file    Active member of club or organization: Not on file    Attends meetings of clubs or organizations: Not on file    Relationship status: Not on file  Other Topics Concern  . Not on file  Social History Narrative  . Not on file   Additional Social History:    Allergies:  No Known Allergies  Labs:  Results for orders placed or performed during the hospital encounter of 11/10/18 (from the past 48 hour(s))  Glucose, capillary     Status: Abnormal   Collection Time: 11/12/18 11:33 AM  Result Value Ref Range   Glucose-Capillary 213 (H) 70 - 99 mg/dL  Glucose, capillary     Status: Abnormal   Collection Time: 11/12/18  4:54 PM  Result Value Ref Range  Glucose-Capillary 112 (H) 70 - 99 mg/dL  Glucose, capillary     Status: Abnormal   Collection Time: 11/12/18  8:42 PM  Result Value Ref Range   Glucose-Capillary 249 (H) 70 - 99 mg/dL   Comment 1 Notify RN    Comment 2 Document in Chart   Creatinine, serum     Status: Abnormal   Collection Time: 11/13/18  4:55 AM  Result Value Ref Range   Creatinine, Ser 2.40 (H) 0.61 - 1.24 mg/dL   GFR calc non Af Amer 29 (L) >60 mL/min   GFR calc Af Amer 33 (L) >60 mL/min    Comment: Performed at [facility] [address], [place], [place] [address]  Glucose, capillary     Status: Abnormal   Collection Time: 11/13/18  7:38 AM  Result Value Ref Range   Glucose-Capillary 192 (H) 70 - 99 mg/dL   Comment 1 Notify RN    Comment 2 Document in Chart   Glucose, capillary     Status: Abnormal   Collection Time: 11/13/18 11:23 AM  Result Value Ref Range   Glucose-Capillary 148 (H) 70 - 99 mg/dL   Comment 1 Notify RN    Comment 2 Document in Chart   Glucose, capillary     Status: Abnormal   Collection Time: 11/13/18  5:17 PM  Result Value Ref Range   Glucose-Capillary 126 (H) 70 - 99 mg/dL   Comment 1 Notify RN    Comment 2 Document  in Chart   Glucose, capillary     Status: Abnormal   Collection Time: 11/13/18  9:12 PM  Result Value Ref Range   Glucose-Capillary 180 (H) 70 - 99 mg/dL   Comment 1 Notify RN    Comment 2 Document in Chart   Glucose, capillary     Status: Abnormal   Collection Time: 11/14/18  7:21 AM  Result Value Ref Range   Glucose-Capillary 172 (H) 70 - 99 mg/dL   Comment 1 Notify RN    Comment 2 Document in Chart     Medications:  Current Facility-Administered Medications  Medication Dose Route Frequency Provider Last Rate Last Dose  . 0.9 %  sodium chloride infusion   Intravenous Continuous Darrick Meigs, Marge Duncans, MD      . acetaminophen (TYLENOL) tablet 650 mg  650 mg Oral Q6H PRN Oswald Hillock, MD       Or  . acetaminophen (TYLENOL) suppository 650 mg  650 mg Rectal Q6H PRN Oswald Hillock, MD      . Chlorhexidine Gluconate Cloth 2 % PADS 6 each  6 each Topical Daily Oswald Hillock, MD   6 each at 11/13/18 1000  . insulin aspart (novoLOG) injection 0-9 Units  0-9 Units Subcutaneous TID WC Oswald Hillock, MD   2 Units at 11/14/18 [phone]  . insulin detemir (LEVEMIR) injection 32 Units  32 Units Subcutaneous Daily Darrick Meigs, Marge Duncans, MD      . ondansetron [facility]) tablet 4 mg  4 mg Oral Q6H PRN Oswald Hillock, MD       Or  . ondansetron (ZOFRAN) injection 4 mg  4 mg Intravenous Q6H PRN Oswald Hillock, MD      . Rivaroxaban (XARELTO) tablet 15 mg  15 mg Oral BID WC Green, Terri L, RPH   15 mg at 11/14/18 0848   Followed by  . [START ON 12/05/2018] rivaroxaban (XARELTO) tablet 20 mg  20 mg Oral Q breakfast Nyoka Cowden, Terri L,  RPH      . tamsulosin (FLOMAX) capsule 0.4 mg  0.4 mg Oral BID Alexis Frock, MD   0.4 mg at 11/14/18 7902    Musculoskeletal: Strength & Muscle Tone: within normal limits Gait & Station: normal Patient leans: N/A  Psychiatric Specialty Exam: Physical Exam  ROS  Blood pressure (!) 156/89, pulse 95, temperature 98.2 F (36.8 C), temperature source Oral, resp. rate 18, height '6\' 3"'  (1.905  m), weight 88.5 kg, SpO2 100 %.Body mass index is 24.37 kg/m.  General Appearance: Fairly Groomed and Well Groomed  Eye Contact:  Minimal  Speech:  Clear and Coherent and Normal Rate  Volume:  Normal  Mood:  alright  Affect:  Depressed and Flat  Thought Process:  Coherent, Linear and Descriptions of Associations: Intact  Orientation:  Full (Time, Place, and Person)  Thought Content:  Logical  Suicidal Thoughts:  No  Homicidal Thoughts:  No  Memory:  Immediate;   Good Recent;   Fair  Judgement:  Fair  Insight:  Fair  Psychomotor Activity:  Normal  Concentration:  Concentration: Fair and Attention Span: Fair  Recall:  [employer] of Knowledge:  Fair  Language:  Good  Akathisia:  No  Handed:  Right  AIMS (if indicated):     Assets:  Communication Skills Desire for Improvement Financial Resources/Insurance Housing Leisure Time Resilience Vocational/Educational  ADL's:  Intact  Cognition:  WNL  Sleep:        Treatment Plan Summary: Daily contact with patient to assess and evaluate symptoms and progress in treatment, Medication management and Plan Will psych clear at this time. It is apparent that patient is aware of his current living conditions, and due to his ongoing medical deterioration he is seekign additional hospital and medical services to expedite his SSDI claim. He denies any previous or current depressive symptoms. He has never made any attempt to harm himself. Will psych clear at this time. May benefit form case manager and social work to help with placement (poor living conditions), and case manager for (medical care services).   Disposition: No evidence of imminent risk to self or others at present.   Patient does not meet criteria for psychiatric inpatient admission. Supportive therapy provided about ongoing stressors.  This service was provided via telemedicine using a 2-way, interactive audio and video technology.  Names of all persons participating in this  telemedicine service and their role in this encounter. Name:Timothy Madden Role: Patient  Name: Sheran Fava Role: FNP-BC    Suella Broad, FNP 11/14/2018 10:20 AM  Attest to NP note

## 2018-11-15 DIAGNOSIS — I824Y9 Acute embolism and thrombosis of unspecified deep veins of unspecified proximal lower extremity: Secondary | ICD-10-CM

## 2018-11-15 DIAGNOSIS — R4589 Other symptoms and signs involving emotional state: Secondary | ICD-10-CM

## 2018-11-15 DIAGNOSIS — F430 Acute stress reaction: Secondary | ICD-10-CM

## 2018-11-15 LAB — CBC
HCT: 37.8 % — ABNORMAL LOW (ref 39.0–52.0)
Hemoglobin: 11.4 g/dL — ABNORMAL LOW (ref 13.0–17.0)
MCH: 26 pg (ref 26.0–34.0)
MCHC: 30.2 g/dL (ref 30.0–36.0)
MCV: 86.1 fL (ref 80.0–100.0)
Platelets: 393 10*3/uL (ref 150–400)
RBC: 4.39 MIL/uL (ref 4.22–5.81)
RDW: 14.6 % (ref 11.5–15.5)
WBC: 11 10*3/uL — ABNORMAL HIGH (ref 4.0–10.5)
nRBC: 0 % (ref 0.0–0.2)

## 2018-11-15 LAB — GLUCOSE, CAPILLARY
Glucose-Capillary: 137 mg/dL — ABNORMAL HIGH (ref 70–99)
Glucose-Capillary: 236 mg/dL — ABNORMAL HIGH (ref 70–99)

## 2018-11-15 MED ORDER — INSULIN ASPART 100 UNIT/ML ~~LOC~~ SOLN: 5.0000 [IU] | Freq: Three times a day (TID) | SUBCUTANEOUS | 0 refills | Status: DC

## 2018-11-15 MED ORDER — CEFUROXIME AXETIL 500 MG PO TABS: 500.0000 mg | ORAL_TABLET | Freq: Two times a day (BID) | ORAL | 0 refills | Status: AC

## 2018-11-15 MED ORDER — RIVAROXABAN (XARELTO) VTE STARTER PACK (15 & 20 MG): ORAL_TABLET | ORAL | 0 refills | Status: DC

## 2018-11-15 MED ORDER — INSULIN DETEMIR 100 UNIT/ML ~~LOC~~ SOLN: 30.0000 [IU] | Freq: Every day | SUBCUTANEOUS | 0 refills | Status: DC

## 2018-11-15 NOTE — Progress Notes (Signed)
Patient discharged home via [facility] cab, discharge instructions reviewed with patient who verbalized understanding.

## 2018-11-15 NOTE — Discharge Summary (Signed)
HOSPITAL DISCHARGE SUMMARY  Timothy Madden  MRN: [id number]  DOB:[birth year]  Date of Admission: 11/10/2018 Date of Discharge: 11/15/2018         LOS: 5 days   Attending Physician:  Birdie Hopes  Patient's PCP:  Marliss Coots, NP  Consults: Treatment Team:  Alexis Frock, MD   Brief Admission History: 58 year old male with a history of diabetes mellitus type 2, CKD stage III, osteomyelitis who came to ED with complaints of right leg swelling.  Patient found to have DVT.  Started on Lovenox.  Also has history of UTI urine retention, chronic indwelling Foley catheter was placed on July 8.  Urology was consulted.  Discharge Diagnoses: Present on Admission: . Leg swelling . Flat affect . Acute stress disorder   DVT-venous duplex of lower extremity positive for popliteal and tibial vein DVT.  Patient was started on Lovenox per pharmacy, at this time Lovenox has been discontinued and started on Xarelto.  Discussed with Pharm.D., recommended Xarelto because of daily dosing. Coupon handed to the patient, he should receive 1 month supply of Xarelto for free.  UTI/urine retention-patient has chronic indwelling Foley catheter   Urology was consulted, Foley catheter has been removed. Patient was able to urinate without problem, continue tamsulosin. Discharged on Ceftin for 5 more days.  Abnormal renal ultrasound-renal ultrasound in July 2020 showed questionable renal mass-urology ordered CT abdomen without contrast, which showed ectopic insertion of the ureters bilaterally into better which are more superior and lateral than typical.  Which could result in dysfunctional ureteral vascular junction and reflux and hydronephrosis.  No masses or stones identified.  Discussed CT results with Dr. Tresa Moore, who feels patient can be discharged and follow-up with urology as outpatient.  Bilateral hydronephrosis Because as above.  Patient to follow-up with urology as outpatient.  Chronic  kidney disease stage III-creatinine 2.49, at baseline.  Diabetes mellitus-glucose well controlled, will change Levemir to 32 units subcu daily.  Sliding scale insulin with NovoLog.  Poor living situation/depression/?  Underlying thought disorder-as per clinical social work patient has poor living condition, has not been taking his medications as he was supposed to.  Adult Protective Services has been involved.   Psychiatry consulted for underlying depression and thought disorder. Although patient thinking process with very tangential and has some flight of ideas but psych recommended he is safe to be discharged home. Patient has support system at home with family he reported his brother-in-law uses insulin and he will help him injecting himself and teaching him how to do that.  He understands clearly that he has very serious condition which is DVT and if he does not take his medication this can worsen and endanger his life. He still carries a high risk for readmission, he received the ED monthly since June 2020.  Allergies as of 11/15/2018   No Known Allergies     Medication List    STOP taking these medications   cephALEXin 500 MG capsule Commonly known as: KEFLEX   ondansetron 4 MG tablet Commonly known as: ZOFRAN     TAKE these medications   blood glucose meter kit and supplies Kit Dispense based on patient and insurance preference. Use up to four times daily as directed. (FOR ICD-9 250.00, 250.01).   cefUROXime 500 MG tablet Commonly known as: Ceftin Take 1 tablet (500 mg total) by mouth 2 (two) times daily with a meal for 5 days.   insulin aspart 100 UNIT/ML injection Commonly known as: novoLOG Inject 5 Units  into the skin 3 (three) times daily with meals.   insulin detemir 100 UNIT/ML injection Commonly known as: LEVEMIR Inject 0.3 mLs (30 Units total) into the skin daily.   INSULIN SYRINGE .5CC/31GX5/16" 31G X 5/16" 0.5 ML Misc Levemir 30 units daily and NovoLog 5  units 3 times a day with meals   Rivaroxaban 15 & 20 MG Tbpk Follow package directions: Take one 3m tablet by mouth twice a day. On day 22, switch to one 270mtablet once a day. Take with food.   tamsulosin 0.4 MG Caps capsule Commonly known as: FLOMAX Take 1 capsule (0.4 mg total) by mouth daily after supper.       Day of Discharge BP 103/79 (BP Location: Right Arm)   Pulse 97   Temp 98.4 F (36.9 C) (Oral)   Resp 19   Ht '6\' 3"'  (1.905 m)   Wt 88.5 kg   SpO2 100%   BMI 24.37 kg/m  Physical Exam:  General: Alert, disoriented not in any acute distress.  HEENT: anicteric sclera, pupils equal reactive to light and accommodation  CVS: S1-S2 heard, no murmur rubs or gallops  Chest: clear to auscultation bilaterally, no wheezing rales or rhonchi  Abdomen: normal bowel sounds, soft, nontender, nondistended, no organomegaly  Neuro: Cranial nerves II-XII intact, no focal neurological deficits  Extremities: no cyanosis, no clubbing or edema noted bilaterally  Results for orders placed or performed during the hospital encounter of 11/10/18 (from the past 24 hour(s))  Glucose, capillary     Status: Abnormal   Collection Time: 11/14/18  4:41 PM  Result Value Ref Range   Glucose-Capillary 182 (H) 70 - 99 mg/dL   Comment 1 Notify RN    Comment 2 Document in Chart   Glucose, capillary     Status: Abnormal   Collection Time: 11/14/18  9:00 PM  Result Value Ref Range   Glucose-Capillary 131 (H) 70 - 99 mg/dL   Comment 1 Notify RN    Comment 2 Document in Chart   CBC     Status: Abnormal   Collection Time: 11/15/18  5:25 AM  Result Value Ref Range   WBC 11.0 (H) 4.0 - 10.5 K/uL   RBC 4.39 4.22 - 5.81 MIL/uL   Hemoglobin 11.4 (L) 13.0 - 17.0 g/dL   HCT 37.8 (L) 39.0 - 52.0 %   MCV 86.1 80.0 - 100.0 fL   MCH 26.0 26.0 - 34.0 pg   MCHC 30.2 30.0 - 36.0 g/dL   RDW 14.6 11.5 - 15.5 %   Platelets 393 150 - 400 K/uL   nRBC 0.0 0.0 - 0.2 %  Glucose, capillary     Status: Abnormal    Collection Time: 11/15/18  7:49 AM  Result Value Ref Range   Glucose-Capillary 137 (H) 70 - 99 mg/dL   Comment 1 Notify RN    Comment 2 Document in Chart   Glucose, capillary     Status: Abnormal   Collection Time: 11/15/18 12:00 PM  Result Value Ref Range   Glucose-Capillary 236 (H) 70 - 99 mg/dL   Comment 1 Notify RN    Comment 2 Document in Chart     Disposition: Home   Follow-up Appts: Discharge Instructions    Diet - low sodium heart healthy   Complete by: As directed    Increase activity slowly   Complete by: As directed       Follow-up Information    [place], MaAudrea MuscatNP Follow up.  Why: please follow up with your Primary Care Physician, and schedule appt. You can also find her at the [facility].  Their number is [phone].  Call ahead to find out when the medical clinic is open Contact information: [place] [phone]           I spent 40 minutes completing paperwork and coordinating discharge efforts.  Signed:  Birdie Hopes 11/15/2018, 1:03 PM

## 2018-12-08 ENCOUNTER — Emergency Department (HOSPITAL_COMMUNITY): Payer: Medicaid Other

## 2018-12-08 ENCOUNTER — Inpatient Hospital Stay (HOSPITAL_COMMUNITY)
Admission: EM | Admit: 2018-12-08 | Discharge: 2019-01-19 | DRG: 871 | Disposition: A | Discharge status: Home Health | Payer: Medicaid Other | Attending: Internal Medicine | Admitting: Internal Medicine

## 2018-12-08 DIAGNOSIS — G9341 Metabolic encephalopathy: Secondary | ICD-10-CM | POA: Diagnosis present

## 2018-12-08 DIAGNOSIS — E43 Unspecified severe protein-calorie malnutrition: Secondary | ICD-10-CM | POA: Diagnosis present

## 2018-12-08 DIAGNOSIS — I472 Ventricular tachycardia: Secondary | ICD-10-CM | POA: Diagnosis present

## 2018-12-08 DIAGNOSIS — Z751 Person awaiting admission to adequate facility elsewhere: Secondary | ICD-10-CM

## 2018-12-08 DIAGNOSIS — L89152 Pressure ulcer of sacral region, stage 2: Secondary | ICD-10-CM | POA: Diagnosis present

## 2018-12-08 DIAGNOSIS — A4153 Sepsis due to Serratia: Principal | ICD-10-CM | POA: Diagnosis present

## 2018-12-08 DIAGNOSIS — Z833 Family history of diabetes mellitus: Secondary | ICD-10-CM

## 2018-12-08 DIAGNOSIS — Z79899 Other long term (current) drug therapy: Secondary | ICD-10-CM

## 2018-12-08 DIAGNOSIS — Z597 Insufficient social insurance and welfare support: Secondary | ICD-10-CM

## 2018-12-08 DIAGNOSIS — E872 Acidosis, unspecified: Secondary | ICD-10-CM | POA: Diagnosis present

## 2018-12-08 DIAGNOSIS — Z20822 Contact with and (suspected) exposure to covid-19: Secondary | ICD-10-CM | POA: Diagnosis present

## 2018-12-08 DIAGNOSIS — R739 Hyperglycemia, unspecified: Secondary | ICD-10-CM

## 2018-12-08 DIAGNOSIS — N1330 Unspecified hydronephrosis: Secondary | ICD-10-CM | POA: Diagnosis present

## 2018-12-08 DIAGNOSIS — R64 Cachexia: Secondary | ICD-10-CM | POA: Diagnosis present

## 2018-12-08 DIAGNOSIS — R4182 Altered mental status, unspecified: Secondary | ICD-10-CM | POA: Diagnosis not present

## 2018-12-08 DIAGNOSIS — J189 Pneumonia, unspecified organism: Secondary | ICD-10-CM

## 2018-12-08 DIAGNOSIS — N183 Chronic kidney disease, stage 3 unspecified: Secondary | ICD-10-CM | POA: Diagnosis present

## 2018-12-08 DIAGNOSIS — Z9119 Patient's noncompliance with other medical treatment and regimen: Secondary | ICD-10-CM

## 2018-12-08 DIAGNOSIS — I82409 Acute embolism and thrombosis of unspecified deep veins of unspecified lower extremity: Secondary | ICD-10-CM | POA: Diagnosis present

## 2018-12-08 DIAGNOSIS — N136 Pyonephrosis: Secondary | ICD-10-CM | POA: Diagnosis present

## 2018-12-08 DIAGNOSIS — D473 Essential (hemorrhagic) thrombocythemia: Secondary | ICD-10-CM | POA: Diagnosis not present

## 2018-12-08 DIAGNOSIS — N184 Chronic kidney disease, stage 4 (severe): Secondary | ICD-10-CM | POA: Diagnosis not present

## 2018-12-08 DIAGNOSIS — E1140 Type 2 diabetes mellitus with diabetic neuropathy, unspecified: Secondary | ICD-10-CM | POA: Diagnosis present

## 2018-12-08 DIAGNOSIS — N170 Acute kidney failure with tubular necrosis: Secondary | ICD-10-CM | POA: Diagnosis present

## 2018-12-08 DIAGNOSIS — F99 Mental disorder, not otherwise specified: Secondary | ICD-10-CM | POA: Diagnosis present

## 2018-12-08 DIAGNOSIS — E1111 Type 2 diabetes mellitus with ketoacidosis with coma: Secondary | ICD-10-CM | POA: Diagnosis present

## 2018-12-08 DIAGNOSIS — R6520 Severe sepsis without septic shock: Secondary | ICD-10-CM | POA: Diagnosis present

## 2018-12-08 DIAGNOSIS — E870 Hyperosmolality and hypernatremia: Secondary | ICD-10-CM | POA: Diagnosis not present

## 2018-12-08 DIAGNOSIS — E1101 Type 2 diabetes mellitus with hyperosmolarity with coma: Secondary | ICD-10-CM | POA: Diagnosis present

## 2018-12-08 DIAGNOSIS — N3289 Other specified disorders of bladder: Secondary | ICD-10-CM | POA: Diagnosis not present

## 2018-12-08 DIAGNOSIS — Q6263 Anomalous implantation of ureter: Secondary | ICD-10-CM

## 2018-12-08 DIAGNOSIS — E1010 Type 1 diabetes mellitus with ketoacidosis without coma: Secondary | ICD-10-CM | POA: Insufficient documentation

## 2018-12-08 DIAGNOSIS — I82401 Acute embolism and thrombosis of unspecified deep veins of right lower extremity: Secondary | ICD-10-CM | POA: Diagnosis present

## 2018-12-08 DIAGNOSIS — F17210 Nicotine dependence, cigarettes, uncomplicated: Secondary | ICD-10-CM | POA: Diagnosis present

## 2018-12-08 DIAGNOSIS — L8990 Pressure ulcer of unspecified site, unspecified stage: Secondary | ICD-10-CM | POA: Insufficient documentation

## 2018-12-08 DIAGNOSIS — E875 Hyperkalemia: Secondary | ICD-10-CM | POA: Diagnosis present

## 2018-12-08 DIAGNOSIS — N390 Urinary tract infection, site not specified: Secondary | ICD-10-CM | POA: Diagnosis present

## 2018-12-08 DIAGNOSIS — I10 Essential (primary) hypertension: Secondary | ICD-10-CM | POA: Diagnosis present

## 2018-12-08 DIAGNOSIS — Z89022 Acquired absence of left finger(s): Secondary | ICD-10-CM

## 2018-12-08 DIAGNOSIS — N3021 Other chronic cystitis with hematuria: Secondary | ICD-10-CM | POA: Diagnosis not present

## 2018-12-08 DIAGNOSIS — R197 Diarrhea, unspecified: Secondary | ICD-10-CM | POA: Diagnosis not present

## 2018-12-08 DIAGNOSIS — E1011 Type 1 diabetes mellitus with ketoacidosis with coma: Secondary | ICD-10-CM

## 2018-12-08 DIAGNOSIS — N12 Tubulo-interstitial nephritis, not specified as acute or chronic: Secondary | ICD-10-CM

## 2018-12-08 DIAGNOSIS — E860 Dehydration: Secondary | ICD-10-CM | POA: Diagnosis present

## 2018-12-08 DIAGNOSIS — D631 Anemia in chronic kidney disease: Secondary | ICD-10-CM | POA: Diagnosis present

## 2018-12-08 DIAGNOSIS — E1122 Type 2 diabetes mellitus with diabetic chronic kidney disease: Secondary | ICD-10-CM | POA: Diagnosis present

## 2018-12-08 DIAGNOSIS — R7881 Bacteremia: Secondary | ICD-10-CM | POA: Diagnosis present

## 2018-12-08 DIAGNOSIS — N179 Acute kidney failure, unspecified: Secondary | ICD-10-CM | POA: Diagnosis present

## 2018-12-08 DIAGNOSIS — N189 Chronic kidney disease, unspecified: Secondary | ICD-10-CM | POA: Diagnosis present

## 2018-12-08 DIAGNOSIS — Z8249 Family history of ischemic heart disease and other diseases of the circulatory system: Secondary | ICD-10-CM

## 2018-12-08 DIAGNOSIS — Z681 Body mass index (BMI) 19 or less, adult: Secondary | ICD-10-CM | POA: Diagnosis not present

## 2018-12-08 DIAGNOSIS — Z590 Homelessness: Secondary | ICD-10-CM

## 2018-12-08 DIAGNOSIS — K5900 Constipation, unspecified: Secondary | ICD-10-CM | POA: Diagnosis not present

## 2018-12-08 DIAGNOSIS — E876 Hypokalemia: Secondary | ICD-10-CM | POA: Diagnosis not present

## 2018-12-08 DIAGNOSIS — A419 Sepsis, unspecified organism: Secondary | ICD-10-CM | POA: Diagnosis present

## 2018-12-08 DIAGNOSIS — N320 Bladder-neck obstruction: Secondary | ICD-10-CM | POA: Diagnosis present

## 2018-12-08 DIAGNOSIS — Z794 Long term (current) use of insulin: Secondary | ICD-10-CM

## 2018-12-08 LAB — BETA-HYDROXYBUTYRIC ACID
Beta-Hydroxybutyric Acid: 0.62 mmol/L — ABNORMAL HIGH (ref 0.05–0.27)
Beta-Hydroxybutyric Acid: 0.07 mmol/L (ref 0.05–0.27)
Beta-Hydroxybutyric Acid: 0.06 mmol/L (ref 0.05–0.27)
Beta-Hydroxybutyric Acid: <0.05 mmol/L — ABNORMAL LOW (ref 0.05–0.27)

## 2018-12-08 LAB — BASIC METABOLIC PANEL
Anion gap: 17 — ABNORMAL HIGH (ref 5–15)
Anion gap: 21 — ABNORMAL HIGH (ref 5–15)
Anion gap: 18 — ABNORMAL HIGH (ref 5–15)
BUN: 167 mg/dL — ABNORMAL HIGH (ref 6–20)
BUN: 165 mg/dL — ABNORMAL HIGH (ref 6–20)
BUN: 164 mg/dL — ABNORMAL HIGH (ref 6–20)
CO2: 11 mmol/L — ABNORMAL LOW (ref 22–32)
CO2: 11 mmol/L — ABNORMAL LOW (ref 22–32)
CO2: 12 mmol/L — ABNORMAL LOW (ref 22–32)
Calcium: 8.1 mg/dL — ABNORMAL LOW (ref 8.9–10.3)
Calcium: 8.4 mg/dL — ABNORMAL LOW (ref 8.9–10.3)
Calcium: 8.5 mg/dL — ABNORMAL LOW (ref 8.9–10.3)
Chloride: 103 mmol/L (ref 98–111)
Chloride: 106 mmol/L (ref 98–111)
Chloride: 116 mmol/L — ABNORMAL HIGH (ref 98–111)
Creatinine, Ser: 11.33 mg/dL — ABNORMAL HIGH (ref 0.61–1.24)
Creatinine, Ser: 11.2 mg/dL — ABNORMAL HIGH (ref 0.61–1.24)
Creatinine, Ser: 10.75 mg/dL — ABNORMAL HIGH (ref 0.61–1.24)
GFR calc Af Amer: 5 mL/min — ABNORMAL LOW (ref >60)
GFR calc Af Amer: 5 mL/min — ABNORMAL LOW (ref >60)
GFR calc Af Amer: 5 mL/min — ABNORMAL LOW (ref >60)
GFR calc non Af Amer: 4 mL/min — ABNORMAL LOW (ref >60)
GFR calc non Af Amer: 4 mL/min — ABNORMAL LOW (ref >60)
GFR calc non Af Amer: 5 mL/min — ABNORMAL LOW (ref >60)
Glucose, Bld: 1113 mg/dL (ref 70–99)
Glucose, Bld: 711 mg/dL (ref 70–99)
Glucose, Bld: 245 mg/dL — ABNORMAL HIGH (ref 70–99)
Potassium: 5 mmol/L (ref 3.5–5.1)
Potassium: 4.6 mmol/L (ref 3.5–5.1)
Potassium: 4.4 mmol/L (ref 3.5–5.1)
Sodium: 131 mmol/L — ABNORMAL LOW (ref 135–145)
Sodium: 138 mmol/L (ref 135–145)
Sodium: 146 mmol/L — ABNORMAL HIGH (ref 135–145)

## 2018-12-08 LAB — CBG MONITORING, ED
Glucose-Capillary: >600 mg/dL (ref 70–99)
Glucose-Capillary: >600 mg/dL (ref 70–99)
Glucose-Capillary: >600 mg/dL (ref 70–99)
Glucose-Capillary: >600 mg/dL (ref 70–99)
Glucose-Capillary: 588 mg/dL (ref 70–99)
Glucose-Capillary: 364 mg/dL — ABNORMAL HIGH (ref 70–99)

## 2018-12-08 LAB — COMPREHENSIVE METABOLIC PANEL
ALT: 14 U/L (ref 0–44)
AST: 23 U/L (ref 15–41)
Albumin: 2.5 g/dL — ABNORMAL LOW (ref 3.5–5.0)
Alkaline Phosphatase: 101 U/L (ref 38–126)
Anion gap: 25 — ABNORMAL HIGH (ref 5–15)
BUN: 182 mg/dL — ABNORMAL HIGH (ref 6–20)
CO2: 10 mmol/L — ABNORMAL LOW (ref 22–32)
Calcium: 9.1 mg/dL (ref 8.9–10.3)
Chloride: 85 mmol/L — ABNORMAL LOW (ref 98–111)
Creatinine, Ser: 12.91 mg/dL — ABNORMAL HIGH (ref 0.61–1.24)
GFR calc Af Amer: 4 mL/min — ABNORMAL LOW (ref >60)
GFR calc non Af Amer: 4 mL/min — ABNORMAL LOW (ref >60)
Glucose, Bld: 1681 mg/dL (ref 70–99)
Potassium: 7.2 mmol/L (ref 3.5–5.1)
Sodium: 120 mmol/L — ABNORMAL LOW (ref 135–145)
Total Bilirubin: 0.9 mg/dL (ref 0.3–1.2)
Total Protein: 9.4 g/dL — ABNORMAL HIGH (ref 6.5–8.1)

## 2018-12-08 LAB — CBC WITH DIFFERENTIAL/PLATELET
Abs Immature Granulocytes: 0.24 10*3/uL — ABNORMAL HIGH (ref 0.00–0.07)
Basophils Absolute: 0.1 10*3/uL (ref 0.0–0.1)
Basophils Relative: 0 %
Eosinophils Absolute: 0 10*3/uL (ref 0.0–0.5)
Eosinophils Relative: 0 %
HCT: 42.8 % (ref 39.0–52.0)
Hemoglobin: 12.3 g/dL — ABNORMAL LOW (ref 13.0–17.0)
Immature Granulocytes: 2 %
Lymphocytes Relative: 3 %
Lymphs Abs: 0.5 10*3/uL — ABNORMAL LOW (ref 0.7–4.0)
MCH: 25.8 pg — ABNORMAL LOW (ref 26.0–34.0)
MCHC: 28.7 g/dL — ABNORMAL LOW (ref 30.0–36.0)
MCV: 89.9 fL (ref 80.0–100.0)
Monocytes Absolute: 0.7 10*3/uL (ref 0.1–1.0)
Monocytes Relative: 4 %
Neutro Abs: 14.2 10*3/uL — ABNORMAL HIGH (ref 1.7–7.7)
Neutrophils Relative %: 91 %
Platelets: 573 10*3/uL — ABNORMAL HIGH (ref 150–400)
RBC: 4.76 MIL/uL (ref 4.22–5.81)
RDW: 17.9 % — ABNORMAL HIGH (ref 11.5–15.5)
WBC: 15.7 10*3/uL — ABNORMAL HIGH (ref 4.0–10.5)
nRBC: 0.2 % (ref 0.0–0.2)

## 2018-12-08 LAB — POCT I-STAT EG7
Acid-base deficit: 19 mmol/L — ABNORMAL HIGH (ref 0.0–2.0)
Bicarbonate: 10.5 mmol/L — ABNORMAL LOW (ref 20.0–28.0)
Calcium, Ion: 1.05 mmol/L — ABNORMAL LOW (ref 1.15–1.40)
HCT: 43 % (ref 39.0–52.0)
Hemoglobin: 14.6 g/dL (ref 13.0–17.0)
O2 Saturation: 54 %
Potassium: 7.1 mmol/L (ref 3.5–5.1)
Sodium: 120 mmol/L — ABNORMAL LOW (ref 135–145)
TCO2: 12 mmol/L — ABNORMAL LOW (ref 22–32)
pCO2, Ven: 38.2 mmHg — ABNORMAL LOW (ref 44.0–60.0)
pH, Ven: 7.048 — CL (ref 7.250–7.430)
pO2, Ven: 40 mmHg (ref 32.0–45.0)

## 2018-12-08 LAB — GLUCOSE, CAPILLARY
Glucose-Capillary: 149 mg/dL — ABNORMAL HIGH (ref 70–99)
Glucose-Capillary: 168 mg/dL — ABNORMAL HIGH (ref 70–99)
Glucose-Capillary: 221 mg/dL — ABNORMAL HIGH (ref 70–99)
Glucose-Capillary: 277 mg/dL — ABNORMAL HIGH (ref 70–99)

## 2018-12-08 LAB — LACTIC ACID, PLASMA
Lactic Acid, Venous: 4.6 mmol/L (ref 0.5–1.9)
Lactic Acid, Venous: 2.3 mmol/L (ref 0.5–1.9)

## 2018-12-08 LAB — HEPARIN LEVEL (UNFRACTIONATED): Heparin Unfractionated: <0.1 IU/mL — ABNORMAL LOW (ref 0.30–0.70)

## 2018-12-08 LAB — SARS CORONAVIRUS 2 (TAT 6-24 HRS): SARS Coronavirus 2: NEGATIVE

## 2018-12-08 LAB — PROTIME-INR
INR: 1.5 — ABNORMAL HIGH (ref 0.8–1.2)
Prothrombin Time: 18.3 seconds — ABNORMAL HIGH (ref 11.4–15.2)

## 2018-12-08 LAB — URINALYSIS, ROUTINE W REFLEX MICROSCOPIC
Bilirubin Urine: NEGATIVE
Glucose, UA: >=500 mg/dL — AB
Ketones, ur: NEGATIVE mg/dL
Nitrite: NEGATIVE
Protein, ur: 100 mg/dL — AB
Specific Gravity, Urine: 1.012 (ref 1.005–1.030)
WBC, UA: >50 WBC/hpf — ABNORMAL HIGH (ref 0–5)
pH: 7 (ref 5.0–8.0)

## 2018-12-08 LAB — TROPONIN I (HIGH SENSITIVITY)
Troponin I (High Sensitivity): 30 ng/L — ABNORMAL HIGH (ref <18)
Troponin I (High Sensitivity): 31 ng/L — ABNORMAL HIGH (ref <18)

## 2018-12-08 LAB — POC SARS CORONAVIRUS 2 AG -  ED: SARS Coronavirus 2 Ag: NEGATIVE

## 2018-12-08 LAB — APTT: aPTT: 27 seconds (ref 24–36)

## 2018-12-08 LAB — PROCALCITONIN: Procalcitonin: 33.61 ng/mL

## 2018-12-08 LAB — MRSA PCR SCREENING: MRSA by PCR: POSITIVE — AB

## 2018-12-08 MED ORDER — ENOXAPARIN SODIUM 80 MG/0.8ML ~~LOC~~ SOLN: 0.8000 mg/kg | SUBCUTANEOUS | Status: DC

## 2018-12-08 MED ORDER — LACTATED RINGERS IV BOLUS
1500.0000 mL | Freq: Once | INTRAVENOUS | Status: AC
  Administered 2018-12-08: 20:00:00 1500 mL via INTRAVENOUS

## 2018-12-08 MED ORDER — METOCLOPRAMIDE HCL 5 MG/ML IJ SOLN
10.0000 mg | Freq: Four times a day (QID) | INTRAMUSCULAR | Status: AC
  Administered 2018-12-08 – 2018-12-09 (×3): 10 mg via INTRAVENOUS
  Filled 2018-12-08 (×3): qty 2

## 2018-12-08 MED ORDER — SODIUM CHLORIDE 0.9 % IV SOLN: 1.0000 g | Freq: Once | INTRAVENOUS | Status: DC

## 2018-12-08 MED ORDER — CHLORHEXIDINE GLUCONATE CLOTH 2 % EX PADS
6.0000 | MEDICATED_PAD | Freq: Every day | CUTANEOUS | Status: DC
  Administered 2018-12-09 – 2018-12-11 (×3): 6 via TOPICAL

## 2018-12-08 MED ORDER — DEXTROSE 50 % IV SOLN: 0.0000 mL | INTRAVENOUS | Status: DC | PRN

## 2018-12-08 MED ORDER — SODIUM CHLORIDE 0.9 % IV SOLN: INTRAVENOUS | Status: DC

## 2018-12-08 MED ORDER — ORAL CARE MOUTH RINSE
15.0000 mL | Freq: Two times a day (BID) | OROMUCOSAL | Status: DC
  Administered 2018-12-09 – 2018-12-12 (×8): 15 mL via OROMUCOSAL

## 2018-12-08 MED ORDER — SODIUM CHLORIDE 0.9 % IV SOLN
500.0000 mg | INTRAVENOUS | Status: DC
  Administered 2018-12-08: 500 mg via INTRAVENOUS
  Filled 2018-12-08 (×2): qty 500

## 2018-12-08 MED ORDER — CHLORHEXIDINE GLUCONATE CLOTH 2 % EX PADS
6.0000 | MEDICATED_PAD | Freq: Every day | CUTANEOUS | Status: DC
  Administered 2018-12-09 – 2019-01-19 (×40): 6 via TOPICAL

## 2018-12-08 MED ORDER — METRONIDAZOLE IN NACL 5-0.79 MG/ML-% IV SOLN
500.0000 mg | Freq: Once | INTRAVENOUS | Status: AC
  Administered 2018-12-08: 12:00:00 500 mg via INTRAVENOUS
  Filled 2018-12-08: qty 100

## 2018-12-08 MED ORDER — INSULIN REGULAR(HUMAN) IN NACL 100-0.9 UT/100ML-% IV SOLN: INTRAVENOUS | Status: DC

## 2018-12-08 MED ORDER — SODIUM BICARBONATE 8.4 % IV SOLN
Freq: Once | INTRAVENOUS | Status: AC
  Administered 2018-12-08: 14:00:00 via INTRAVENOUS
  Filled 2018-12-08: qty 100

## 2018-12-08 MED ORDER — LACTATED RINGERS IV BOLUS: 20.0000 mL/kg | Freq: Once | INTRAVENOUS | Status: DC

## 2018-12-08 MED ORDER — HEPARIN BOLUS VIA INFUSION
6000.0000 [IU] | Freq: Once | INTRAVENOUS | Status: AC
  Administered 2018-12-08: 6000 [IU] via INTRAVENOUS
  Filled 2018-12-08: qty 6000

## 2018-12-08 MED ORDER — SODIUM CHLORIDE 0.9 % IV BOLUS
20.0000 mL/kg | Freq: Once | INTRAVENOUS | Status: AC
  Administered 2018-12-08: 12:00:00 1738 mL via INTRAVENOUS

## 2018-12-08 MED ORDER — INSULIN ASPART 100 UNIT/ML IV SOLN
10.0000 [IU] | Freq: Once | INTRAVENOUS | Status: AC
  Administered 2018-12-08: 13:00:00 10 [IU] via INTRAVENOUS

## 2018-12-08 MED ORDER — SODIUM CHLORIDE 0.9 % IV SOLN
1.0000 g | INTRAVENOUS | Status: DC
  Filled 2018-12-08: qty 1

## 2018-12-08 MED ORDER — VANCOMYCIN HCL 10 G IV SOLR
2000.0000 mg | Freq: Once | INTRAVENOUS | Status: AC
  Administered 2018-12-08: 2000 mg via INTRAVENOUS
  Filled 2018-12-08: qty 2000

## 2018-12-08 MED ORDER — SODIUM CHLORIDE 0.9 % IV BOLUS
1000.0000 mL | Freq: Once | INTRAVENOUS | Status: AC
  Administered 2018-12-08: 12:00:00 1000 mL via INTRAVENOUS

## 2018-12-08 MED ORDER — CALCIUM GLUCONATE 10 % IV SOLN
1.0000 g | Freq: Once | INTRAVENOUS | Status: AC
  Administered 2018-12-08: 13:00:00 1 g via INTRAVENOUS
  Filled 2018-12-08: qty 10

## 2018-12-08 MED ORDER — DEXTROSE-NACL 5-0.45 % IV SOLN
INTRAVENOUS | Status: DC
  Administered 2018-12-08 – 2018-12-09 (×2): via INTRAVENOUS

## 2018-12-08 MED ORDER — INSULIN REGULAR(HUMAN) IN NACL 100-0.9 UT/100ML-% IV SOLN
INTRAVENOUS | Status: DC
  Administered 2018-12-08: 15:00:00 7.5 [IU]/h via INTRAVENOUS
  Administered 2018-12-08: 1.4 [IU]/h via INTRAVENOUS
  Administered 2018-12-09: 04:00:00 2.8 [IU]/h via INTRAVENOUS
  Filled 2018-12-08 (×3): qty 100

## 2018-12-08 MED ORDER — DEXTROSE-NACL 5-0.45 % IV SOLN: INTRAVENOUS | Status: DC

## 2018-12-08 MED ORDER — CHLORHEXIDINE GLUCONATE 0.12 % MT SOLN
15.0000 mL | Freq: Two times a day (BID) | OROMUCOSAL | Status: DC
  Administered 2018-12-08 – 2018-12-13 (×11): 15 mL via OROMUCOSAL
  Filled 2018-12-08 (×7): qty 15

## 2018-12-08 MED ORDER — VANCOMYCIN HCL IN DEXTROSE 1-5 GM/200ML-% IV SOLN: 1000.0000 mg | Freq: Once | INTRAVENOUS | Status: DC

## 2018-12-08 MED ORDER — HEPARIN (PORCINE) 25000 UT/250ML-% IV SOLN
1550.0000 [IU]/h | INTRAVENOUS | Status: DC
  Administered 2018-12-08 – 2018-12-09 (×3): 1550 [IU]/h via INTRAVENOUS
  Filled 2018-12-08 (×2): qty 250

## 2018-12-08 MED ORDER — PANTOPRAZOLE SODIUM 40 MG IV SOLR
40.0000 mg | Freq: Every day | INTRAVENOUS | Status: DC
  Administered 2018-12-08 – 2018-12-09 (×2): 40 mg via INTRAVENOUS
  Filled 2018-12-08 (×2): qty 40

## 2018-12-08 MED ORDER — ONDANSETRON HCL 4 MG/2ML IJ SOLN
4.0000 mg | Freq: Once | INTRAMUSCULAR | Status: AC
  Administered 2018-12-08: 4 mg via INTRAVENOUS
  Filled 2018-12-08: qty 2

## 2018-12-08 MED ORDER — VANCOMYCIN VARIABLE DOSE PER UNSTABLE RENAL FUNCTION (PHARMACIST DOSING): Status: DC

## 2018-12-08 MED ORDER — SODIUM CHLORIDE 0.9 % IV SOLN
2.0000 g | Freq: Once | INTRAVENOUS | Status: AC
  Administered 2018-12-08: 12:00:00 2 g via INTRAVENOUS
  Filled 2018-12-08: qty 2

## 2018-12-08 MED ORDER — MUPIROCIN 2 % EX OINT
1.0000 "application " | TOPICAL_OINTMENT | Freq: Two times a day (BID) | CUTANEOUS | Status: AC
  Administered 2018-12-09 – 2018-12-13 (×10): 1 via NASAL
  Filled 2018-12-08: qty 22

## 2018-12-08 MED ORDER — SODIUM CHLORIDE 0.9 % IV SOLN
INTRAVENOUS | Status: DC
  Administered 2018-12-08 (×2): via INTRAVENOUS

## 2018-12-08 MED ORDER — INSULIN REGULAR(HUMAN) IN NACL 100-0.9 UT/100ML-% IV SOLN
INTRAVENOUS | Status: DC
  Filled 2018-12-08: qty 100

## 2018-12-08 NOTE — ED Provider Notes (Signed)
[facility] EMERGENCY DEPARTMENT Provider Note   CSN: [id number] Arrival date & time: 12/08/18  1014     History   Chief Complaint Chief Complaint  Patient presents with  . Altered Mental Status    HPI Timothy Madden is a 58 y.o. male.     The history is provided by the EMS personnel and medical records. The history is limited by the condition of the patient. No language interpreter was used.     58 year old male with history of type 1 diabetes insulin controlled, psychiatric disorder, hypertension, brought here via EMS from home for evaluation of altered mental status.  Per EMS, patient is in the house, house member contact due to altered mental status.  Patient was found altered laying on the couch.  He responds only to pain and was nonverbal.  Initial CBG greater than 600.  Patient was giving IV fluid and brought here.  Limited history obtained as patient is altered.  Does have history of type 1 diabetes.  EMS report people that lives at his house was unable to provide any additional history. Level V caveat applies.  Past Medical History:  Diagnosis Date  . Amputation of finger, left   . Complication of anesthesia   . Diabetes mellitus without complication ([place])   . DKA (diabetic ketoacidoses) ([place]) 06/2018  . PONV (postoperative nausea and vomiting)     Patient Active Problem List   Diagnosis Date Noted  . 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 Acute stress disorder 11/14/2018  . Leg swelling 11/10/2018  . DKA, type 2 ([place]) 06/28/2018  . Acute renal failure superimposed on chronic kidney disease ([place]) 06/28/2018  . Elevated lipase 06/28/2018  . Osteomyelitis of finger of left hand ([place]) 10/26/2013  . Osteomyelitis ([place]) 10/26/2013  . Sepsis ([place]) 10/26/2013  . AKI (acute kidney injury) ([place]) 10/26/2013  . Hyponatremia 10/26/2013  . Type I diabetes mellitus with complication, uncontrolled ([place]) 10/26/2013  . Essential hypertension 10/26/2013  . Anemia of  chronic disease 10/26/2013  . PSYCHIATRIC DISORDER 07/19/2006  . TOBACCO ABUSE 07/19/2006  . DIABETES MELLITUS, TYPE II 11/30/2005  . ANXIETY 11/30/2005    Past Surgical History:  Procedure Laterality Date  . I&D EXTREMITY Left 10/26/2013   Procedure: IRRIGATION AND DEBRIDEMENT EXTREMITY,PARTIAL AMPUTATION LEFT INDEX FINGER;  Surgeon: Dayna Barker, MD;  Location: [facility];  Service: Plastics;  Laterality: Left;  . I&D EXTREMITY Left 10/30/2013   Procedure: IRRIGATION AND DEBRIDEMENT AND REVISION AMPUTATION OF LEFT INDEX FINGER;  Surgeon: Dayna Barker, MD;  Location: [facility];  Service: Plastics;  Laterality: Left;        Home Medications    Prior to Admission medications   Medication Sig Start Date End Date Taking? Authorizing Provider  blood glucose meter kit and supplies KIT Dispense based on patient and insurance preference. Use up to four times daily as directed. (FOR ICD-9 250.00, 250.01). 07/02/18   Aline August, MD  insulin aspart (NOVOLOG) 100 UNIT/ML injection Inject 5 Units into the skin 3 (three) times daily with meals. 11/15/18   Verlee Monte, MD  insulin detemir (LEVEMIR) 100 UNIT/ML injection Inject 0.3 mLs (30 Units total) into the skin daily. 11/15/18   Verlee Monte, MD  Insulin Syringe-Needle U-100 (INSULIN SYRINGE .5CC/31GX5/16") 31G X 5/16" 0.5 ML MISC Levemir 30 units daily and NovoLog 5 units 3 times a day with meals 07/02/18   Aline August, MD  Rivaroxaban 15 & 20 MG TBPK Follow package directions: Take one 3m tablet by mouth  twice a day. On day 22, switch to one 36m tablet once a day. Take with food. 11/15/18   EVerlee Monte MD  tamsulosin (FLOMAX) 0.4 MG CAPS capsule Take 1 capsule (0.4 mg total) by mouth daily after supper. Patient not taking: Reported on 08/22/2018 07/02/18   AAline August MD    Family History Family History  Problem Relation Age of Onset  . Hypertension Mother   . Diabetes Father   . Diabetes Sister   . Diabetes Brother   . Heart  disease Brother     Social History Social History   Tobacco Use  . Smoking status: Current Every Day Smoker    Packs/day: 1.00    Years: 20.00    Pack years: 20.00    Types: Cigarettes  . Smokeless tobacco: Never Used  Substance Use Topics  . Alcohol use: No    Alcohol/week: 0.0 standard drinks  . Drug use: No     Allergies   Patient has no known allergies.   Review of Systems Review of Systems  Unable to perform ROS: Mental status change     Physical Exam Updated Vital Signs BP 107/82   Pulse 96   Temp (!) 102.5 F (39.2 C) (Rectal)   Resp (!) 26   Ht '6\' 3"'  (1.905 m)   Wt 86.9 kg   SpO2 100%   BMI 23.95 kg/m   Physical Exam Vitals signs and nursing note reviewed.  Constitutional:      Appearance: He is well-developed.     Comments: Ill-appearing male, only able to response to painful stimuli, appears altered and lethargic.  HENT:     Head: Atraumatic.     Mouth/Throat:     Mouth: Mucous membranes are dry.  Eyes:     Conjunctiva/sclera: Conjunctivae normal.     Pupils: Pupils are equal, round, and reactive to light.  Neck:     Musculoskeletal: Neck supple. No neck rigidity.  Cardiovascular:     Rate and Rhythm: Tachycardia present.     Pulses: Normal pulses.     Heart sounds: Normal heart sounds.  Pulmonary:     Breath sounds: Normal breath sounds.     Comments: Tachypneic Abdominal:     Palpations: Abdomen is soft.     Tenderness: There is no abdominal tenderness.  Genitourinary:    Comments: Pants soil of stool Skin:    Findings: No rash.  Neurological:     Mental Status: He is disoriented.     GCS: GCS eye subscore is 3. GCS verbal subscore is 3. GCS motor subscore is 4.     Motor: Weakness and tremor present.      ED Treatments / Results  Labs (all labs ordered are listed, but only abnormal results are displayed) Labs Reviewed  LACTIC ACID, PLASMA - Abnormal; Notable for the following components:      Result Value   Lactic Acid,  Venous 4.6 (*)    All other components within normal limits  CBC WITH DIFFERENTIAL/PLATELET - Abnormal; Notable for the following components:   WBC 15.7 (*)    Hemoglobin 12.3 (*)    MCH 25.8 (*)    MCHC 28.7 (*)    RDW 17.9 (*)    Platelets 573 (*)    Neutro Abs 14.2 (*)    Lymphs Abs 0.5 (*)    Abs Immature Granulocytes 0.24 (*)    All other components within normal limits  PROTIME-INR - Abnormal; Notable for the following components:  Prothrombin Time 18.3 (*)    INR 1.5 (*)    All other components within normal limits  URINALYSIS, ROUTINE W REFLEX MICROSCOPIC - Abnormal; Notable for the following components:   APPearance TURBID (*)    Glucose, UA >=500 (*)    Hgb urine dipstick LARGE (*)    Protein, ur 100 (*)    Leukocytes,Ua LARGE (*)    WBC, UA >50 (*)    Bacteria, UA MANY (*)    All other components within normal limits  BETA-HYDROXYBUTYRIC ACID - Abnormal; Notable for the following components:   Beta-Hydroxybutyric Acid 0.62 (*)    All other components within normal limits  CBG MONITORING, ED - Abnormal; Notable for the following components:   Glucose-Capillary >600 (*)    All other components within normal limits  POCT I-STAT EG7 - Abnormal; Notable for the following components:   pH, Ven 7.048 (*)    pCO2, Ven 38.2 (*)    Bicarbonate 10.5 (*)    TCO2 12 (*)    Acid-base deficit 19.0 (*)    Sodium 120 (*)    Potassium 7.1 (*)    Calcium, Ion 1.05 (*)    All other components within normal limits  CULTURE, BLOOD (ROUTINE X 2)  CULTURE, BLOOD (ROUTINE X 2)  URINE CULTURE  SARS CORONAVIRUS 2 (TAT 6-24 HRS)  APTT  LACTIC ACID, PLASMA  COMPREHENSIVE METABOLIC PANEL  BLOOD GAS, VENOUS  BETA-HYDROXYBUTYRIC ACID  POC SARS CORONAVIRUS 2 AG -  ED  TROPONIN I (HIGH SENSITIVITY)  TROPONIN I (HIGH SENSITIVITY)    EKG EKG Interpretation  Date/Time:  Saturday December 08 2018 10:36:30 EST Ventricular Rate:  106 PR Interval:    QRS Duration: 89 QT Interval:   330 QTC Calculation: 443 R Axis:   -12 Text Interpretation: Sinus tachycardia Ventricular tachycardia, unsustained Aberrant conduction of SV complex(es) Abnormal R-wave progression, early transition ST-t wave abnormality similar to 2015 study with slightly more increased peaked t waves Abnormal ECG Confirmed by Carmin Muskrat [phone]) on 12/08/2018 11:37:44 AM   Radiology Dg Chest Port 1 View  Result Date: 12/08/2018 CLINICAL DATA:  [facility] reports that the patient has been altered for an unknown amount of time. Pt does not answer questions but reacts to pain. Pt hx diabetes. Pt is a smoker EXAM: PORTABLE CHEST - 1 VIEW COMPARISON:  11/21/2007 FINDINGS: Lungs are clear. Heart size and mediastinal contours are within normal limits. No effusion. Visualized bones unremarkable. IMPRESSION: No acute cardiopulmonary disease. Electronically Signed   By: Lucrezia Europe M.D.   On: 12/08/2018 12:11    Procedures Procedures (including critical care time)  Medications Ordered in ED Medications  metroNIDAZOLE (FLAGYL) IVPB 500 mg (500 mg Intravenous New Bag/Given 12/08/18 1222)  insulin regular, human (MYXREDLIN) 100 units/ 100 mL infusion (has no administration in time range)  0.9 %  sodium chloride infusion ( Intravenous New Bag/Given 12/08/18 1225)  dextrose 5 %-0.45 % sodium chloride infusion ( Intravenous Hold 12/08/18 1132)  dextrose 50 % solution 0-50 mL (has no administration in time range)  vancomycin (VANCOCIN) 2,000 mg in sodium chloride 0.9 % 500 mL IVPB (2,000 mg Intravenous New Bag/Given 12/08/18 1225)  calcium gluconate inj 10% (1 g) URGENT USE ONLY! (has no administration in time range)  insulin aspart (novoLOG) injection 10 Units (has no administration in time range)  sodium bicarbonate 100 mEq in dextrose 5 % 1,000 mL infusion (has no administration in time range)  ceFEPIme (MAXIPIME) 2 g in sodium chloride 0.9 %  100 mL IVPB (0 g Intravenous Stopped 12/08/18 1241)  sodium chloride 0.9 %  bolus 1,738 mL (1,738 mLs Intravenous New Bag/Given 12/08/18 1223)  sodium chloride 0.9 % bolus 1,000 mL (0 mLs Intravenous Stopped 12/08/18 1241)     Initial Impression / Assessment and Plan / ED Course  I have reviewed the triage vital signs and the nursing notes.  Pertinent labs & imaging results that were available during my care of the patient were reviewed by me and considered in my medical decision making (see chart for details).        BP 107/82   Pulse 96   Temp (!) 102.5 F (39.2 C) (Rectal)   Resp (!) 26   Ht '6\' 3"'  (1.905 m)   Wt 86.9 kg   SpO2 100%   BMI 23.95 kg/m    Final Clinical Impressions(s) / ED Diagnoses   Final diagnoses:  Sepsis, due to unspecified organism, unspecified whether acute organ dysfunction present ([place])  Hyperkalemia  Hyperglycemia  Pyelonephritis    ED Discharge Orders    None     12:43 PM Patient with history of type 1 diabetes brought here for altered mental status.  Initial CBG greater than 600.  Patient is altered with a Glascow coma score of 10.  Initial EKG at the scene with diffuse peaked T waves.  Proper evaluation initiated.  Will provide IV fluid, assess for any potential underlying infection, broad-spectrum antibiotic initiated.  Patient will be monitored closely.  Care discussed with Dr. Vanita Panda.  12:44 PM Patient has a pH of 7.048 and elevated potassium of 7.1 with peak T wave changes on EKG.  Prompt treatment of hyperkalemia which includes serum bicarb, calcium gluconate, IV fluid and insulin to be given.  I have initiated broad-spectrum antibiotic as well as IV fluid at 30 mL/kg.  Patient does have a temperature of 102.5, Tylenol suppository ordered.  Chest x-ray unremarkable.  COVID-19 testing is pending.  Urine culture sent.  Blood culture obtained.  Glucose stabilizer protocol initiated.  Sepsis reassessment done.  CRITICAL CARE Performed by: Domenic Moras Total critical care time: 50 minutes Critical care time was  exclusive of separately billable procedures and treating other patients. Critical care was necessary to treat or prevent imminent or life-threatening deterioration. Critical care was time spent personally by me on the following activities: development of treatment plan with patient and/or surrogate as well as nursing, discussions with consultants, evaluation of patient's response to treatment, examination of patient, obtaining history from patient or surrogate, ordering and performing treatments and interventions, ordering and review of laboratory studies, ordering and review of radiographic studies, pulse oximetry and re-evaluation of patient's condition.  1:29 PM Appreciate consultation from nephrologist Dr. Melvia Heaps in regard to pt's hyperkalemia.  He recommend continue with current management, with anticipation of improvement of K+ after hyperglycemia is treated.  He will be available for consultation if needed.  Pt does not need dialysis.   1:59 PM Appreciate consultation from [place] Dr. Tamala Julian who agrees to admit pt to ICU for further management.    Domenic Moras, PA-C 12/08/18 1400    Carmin Muskrat, MD 12/09/18 [phone]

## 2018-12-08 NOTE — ED Triage Notes (Signed)
[facility] reports that the patient has been altered for an unknown amount of time. He is currently A&OX0 and making moaning noises. He does react to pain. EMS does not have a set of reliable vitals.

## 2018-12-08 NOTE — Progress Notes (Addendum)
ANTICOAGULATION CONSULT NOTE   Pharmacy Consult for Heparin  Indication: DVT  No Known Allergies  Patient Measurements: Height: 6\' 3"  (190.5 cm) Weight: 191 lb 9.3 oz (86.9 kg) IBW/kg (Calculated) : 84.5 Heparin Dosing Weight: 86.9  Vital Signs: Temp: 102.5 F (39.2 C) (11/28 1041) Temp Source: Rectal (11/28 1041) BP: 125/67 (11/28 1515) Pulse Rate: 95 (11/28 1500)  Labs: Recent Labs    12/08/18 1201 12/08/18 1224 12/08/18 1351  HGB 12.3* 14.6  --   HCT 42.8 43.0  --   PLT 573*  --   --   APTT 27  --   --   LABPROT 18.3*  --   --   INR 1.5*  --   --   HEPARINUNFRC <0.10*  --   --   CREATININE 12.91*  --   --   TROPONINIHS 30*  --  31*    Estimated Creatinine Clearance: 7.5 mL/min (A) (by C-G formula based on SCr of 12.91 mg/dL (H)).   Medical History: Past Medical History:  Diagnosis Date  . Amputation of finger, left   . Complication of anesthesia   . Diabetes mellitus without complication ([place])   . DKA (diabetic ketoacidoses) ([place]) 06/2018  . PONV (postoperative nausea and vomiting)     Medications:  Scheduled:  . heparin  6,000 Units Intravenous Once  . metoCLOPramide (REGLAN) injection  10 mg Intravenous Q6H  . vancomycin variable dose per unstable renal function (pharmacist dosing)   Does not apply See admin instructions    Assessment: Patient is a 28 yom that was recently admitted at the end of last month and was diagnosed with a DVT. The patient was discharged on Xarelto. At this time the patient is unable to take PO medications and pharmacy has been consulted to dose heparin at this time. Patient is unable to tell me when his last dose of xarelto was.  - Ordered baseline heparin level to determine if patient was taking xarelto and weather or not to bolus.   Goal of Therapy:  Heparin level 0.3-0.7 units/ml Monitor platelets by anticoagulation protocol: Yes   Plan:  - Baseline heparin level is <0.10 which likely indicates that the patient has  not recently taken his home xarelto  - Will bolus the patient with heparin 6000 units  - Heparin drip at 1550 units/hr  - Will check heparin level and aPTT  in 8 hours just to confirm correlation - Monitor for s/s of bleeding and CBC daily while on heparin   Duanne Limerick PharmD. BCPS 12/08/2018,3:37 PM

## 2018-12-08 NOTE — H&P (Signed)
NAME:  Timothy Madden, MRN:  [id number], DOB:  Jul 17, 1960, LOS: 0 ADMISSION DATE:  12/08/2018, CONSULTATION DATE:  12/08/18 REFERRING MD:  ED, CHIEF COMPLAINT:  AMS   Brief History   This is a 58 year old man with a history of numerous psychiatric issues, diabetes poorly controlled supposed to be on home insulin presenting with obtundation from home found to be in severe DKA/HOCM.  History of present illness   This is a 58 year old man with a history of numerous psychiatric issues, diabetes poorly controlled supposed to be on home insulin presenting with obtundation from home found to be in severe DKA/HOCM.  History is per chart review as patient is extremely poorly responsive.  Numerous other electrolyte abnormalities were noted which are also consistent with DKA.  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  Objective   Blood pressure 116/71, pulse 96, temperature (!) 102.5 F (39.2 C), temperature source Rectal, resp. rate 14, height _0  (1.905 m), weight 86.9 kg, SpO2 100 %.        Intake/Output Summary (Last 24 hours) at 12/08/2018 1428 Last data filed at 12/08/2018 1409 Gross per 24 hour  Intake 2938 ml  Output -   Net 2938 ml   Filed Weights   12/08/18 1200  Weight: 86.9 kg    Examination: General: Ill-appearing middle-aged man laying in bed HENT: Mucous membranes dry, trachea midline, temporal wasting noted Lungs: He has scattered transmitted upper airway sounds, tachypneic, no wheezing Cardiovascular: He has borderline tachycardic, he has a systolic murmur, extremities are warm Abdomen: Soft, hypoactive bowel sounds Extremities: Muscle wasting without edema, he has missing left finger, severe changes of diabetic neuropathy and joint issues Neuro: He will withdraw to pain x4 extremities  Trop Neg x 2 EKG without ischemic changes K 7 Na 120 (corrected 145) Glucose 1681 BUN 182 Cr 13   Resolved Hospital Problem list   NA  Assessment & Plan:  # Severe sepsis, question of urinary origin given his previous issues with this # HOCM/DKA overlap, mostly former, starting insulin drip # Acute metabolic encephalopathy # Numerous psych issues limiting compliance # Pseudohyponatremia, hyperkalemia, acute renal failure all related to sequelae of the severe sepsis, dehydration, and prolonged hyperglycemic state # Lactic and ketoacidosis # Hx recent DVT, doubt home AC compliance # Cough/ brown sputum # Likely diabetic gastroparesis  - Usual DKA endotool pathway - Liberal fluids - Trend K/Cr/UoP/Na, place foley given severity of renal injury - Broad spectrum abx, f/u blood/urine cultures - Start heparin drip, hold NoAC for now - Diabetes educator consult - Not sure what can be done to prevent readmission down the line  Best practice:  Diet: NPO due to mental status Pain/Anxiety/Delirium protocol (if indicated): Avoid all VAP protocol (if indicated): N/A, aspiration precautions DVT prophylaxis: heparin gtt GI prophylaxis: PPI Glucose control: insulin  gtt Mobility: BR Code Status: I presume full Family Communication: called x 2, no answer Disposition: ICU  Labs   CBC: Recent Labs   Lab 12/08/18 1201 12/08/18 1224  WBC 15.7*  --   NEUTROABS 14.2*  --   HGB 12.3* 14.6  HCT 42.8 43.0  MCV 89.9  --   PLT 573*  --     Basic Metabolic Panel: Recent Labs  Lab 12/08/18 1201 12/08/18 1224  NA 120* 120*  K 7.2* 7.1*  CL 85*  --   CO2 10*  --   GLUCOSE 1,681*  --   BUN 182*  --   CREATININE 12.91*  --   CALCIUM 9.1  --    GFR: Estimated Creatinine Clearance: 7.5 mL/min (A) (by C-G formula based on SCr of 12.91 mg/dL (H)). Recent Labs  Lab 12/08/18 1201  WBC 15.7*  LATICACIDVEN 4.6*    Liver Function Tests: Recent Labs  Lab 12/08/18 1201  AST 23  ALT 14  ALKPHOS 101  BILITOT 0.9  PROT 9.4*  ALBUMIN 2.5*   No results for input(s): LIPASE, AMYLASE in the last 168 hours. No results for input(s): AMMONIA in the last 168 hours.  ABG    Component Value Date/Time   HCO3 10.5 (L) 12/08/2018 1224   TCO2 12 (L) 12/08/2018 1224   ACIDBASEDEF 19.0 (H) 12/08/2018 1224   O2SAT 54.0 12/08/2018 1224     Coagulation Profile: Recent Labs  Lab 12/08/18 1201  INR 1.5*    Cardiac Enzymes: No results for input(s): CKTOTAL, CKMB, CKMBINDEX, TROPONINI in the last 168 hours.  HbA1C: Hgb A1c MFr Bld  Date/Time Value Ref Range Status  11/10/2018 11:57 AM 14.3 (H) 4.8 - 5.6 % Final    Comment:    (NOTE) Pre diabetes:          5.7%-6.4% Diabetes:              >6.4% Glycemic control for   <7.0% adults with diabetes   06/29/2018 04:50 AM 14.6 (H) 4.8 - 5.6 % Final    Comment:    (NOTE)         Prediabetes: 5.7 - 6.4         Diabetes: >6.4         Glycemic control for adults with diabetes: <7.0     CBG: Recent Labs  Lab 12/08/18 1017  GLUCAP >600*    Review of Systems:   Cannot assess  Past Medical History  He,  has a past medical history of Amputation of finger, left, Complication of anesthesia, Diabetes mellitus without complication ([place]), DKA (diabetic ketoacidoses) ([place]) (06/2018), and PONV (postoperative nausea and vomiting).    Surgical History    Past Surgical History:  Procedure Laterality Date  . I&D EXTREMITY Left 10/26/2013   Procedure: IRRIGATION AND DEBRIDEMENT EXTREMITY,PARTIAL AMPUTATION LEFT INDEX FINGER;  Surgeon: Dayna Barker, MD;  Location: [facility];  Service: Plastics;  Laterality: Left;  . I&D EXTREMITY Left 10/30/2013   Procedure: IRRIGATION AND DEBRIDEMENT AND REVISION AMPUTATION OF LEFT INDEX FINGER;  Surgeon: Dayna Barker, MD;  Location: [facility];  Service: Plastics;  Laterality: Left;     Social History   reports that he has been smoking cigarettes. He has a 20.00 pack-year smoking history. He has never used smokeless tobacco. He reports that he does not drink alcohol or use drugs.   Family History   His family history includes Diabetes in his brother, father, and sister; Heart disease in his brother; Hypertension  in his mother.   Allergies No Known Allergies   Home Medications  Prior to Admission medications   Medication Sig Start Date End Date Taking? Authorizing Provider  blood glucose meter kit and supplies KIT Dispense based on patient and insurance preference. Use up to four times daily as directed. (FOR ICD-9 250.00, 250.01). 07/02/18   Aline August, MD  insulin aspart (NOVOLOG) 100 UNIT/ML injection Inject 5 Units into the skin 3 (three) times daily with meals. 11/15/18   Verlee Monte, MD  insulin detemir (LEVEMIR) 100 UNIT/ML injection Inject 0.3 mLs (30 Units total) into the skin daily. 11/15/18   Verlee Monte, MD  Insulin Syringe-Needle U-100 (INSULIN SYRINGE .5CC/31GX5/16") 31G X 5/16" 0.5 ML MISC Levemir 30 units daily and NovoLog 5 units 3 times a day with meals 07/02/18   Aline August, MD  Rivaroxaban 15 & 20 MG TBPK Follow package directions: Take one 79m tablet by mouth twice a day. On day 22, switch to one 267mtablet once a day. Take with food. 11/15/18   ElVerlee MonteMD  tamsulosin (FLOMAX) 0.4 MG CAPS capsule Take 1 capsule (0.4 mg total) by mouth daily after supper.  07/02/18   AlAline AugustMD     Critical care time: 34 minutes

## 2018-12-08 NOTE — ED Notes (Signed)
I Stat VBG result reported to Domenic Moras PA

## 2018-12-08 NOTE — ED Notes (Signed)
CC paged to RN R[place] per her request

## 2018-12-08 NOTE — Progress Notes (Addendum)
eLink Physician-Brief Progress Note Patient Name: Timothy Madden DOB: [birth year] MRN: [id number]   Date of Service  12/08/2018  HPI/Events of Note  58 yr old male with hx of Pysch disorder, DM admitted for DKA/hyperkalemia/psudeohyponatremia, Code sepsis from suspected UTI. Acute on CKD with AGMA. DVT hx non compliant ? With [facility]. Received hyperkalemia Rx with follow up K 4.6, last BG 364.   Camera: HR 93, frial. 99/59. 100% sats on nasal o2.   Data: Reviewed. LA 2.3, troponin 31. BHB 0.06, Covid neg on rapid screening. EKG tall T waves, qtc 443.  eICU Interventions  - continue DKA protocol/fluids - on Heparin gtt for DVt - asp precautions - follow troponin. - follow UOP and tissue perfusion - keep MAP > 65.  On abx pending cultures.      Intervention Category Major Interventions: Sepsis - evaluation and management;Hyperglycemia - active titration of insulin therapy Evaluation Type: New Patient Evaluation  Elmer Sow 12/08/2018, 9:06 PM

## 2018-12-08 NOTE — Progress Notes (Signed)
Pharmacy Antibiotic Note  Timothy Madden is a 58 y.o. male admitted on 12/08/2018 with sepsis.  Pharmacy has been consulted for Cefepime and Vancomycin dosing.    No data recorded.  No results for input(s): WBC, CREATININE, LATICACIDVEN, VANCOTROUGH, VANCOPEAK, VANCORANDOM, GENTTROUGH, GENTPEAK, GENTRANDOM, TOBRATROUGH, TOBRAPEAK, TOBRARND, AMIKACINPEAK, AMIKACINTROU, AMIKACIN in the last 168 hours.  CrCl cannot be calculated (Patient's most recent lab result is older than the maximum 21 days allowed.).    No Known Allergies  Antimicrobials this admission: 11/28 Cefepime >>  11/28 Vancomcyin >>    Microbiology results: 11/28 BCx: Pending 11/28 UCx: Pending   Assessment:  Asked to dose cefepime and vancomycin for sepsis, potential UTI and patient apparently has a pressure ulcer also.   Plan: - Cefepime 2 grams IV x 1 dose given in the ER  - Follow with Cefepime 1 g IV q24 hr - Vancomycin loading dose of 2000 mg IV x 1 dose in ED - Will dose vancomycin by random levels based on patients renal function  - Will monitor for improvements in patient's renal function or need for HD and adjust abx as needed.   Thank you for allowing pharmacy to be a part of this patient's care.  Duanne Limerick PharmD. BCPS 12/08/2018 10:37 AM

## 2018-12-09 DIAGNOSIS — A419 Sepsis, unspecified organism: Secondary | ICD-10-CM

## 2018-12-09 DIAGNOSIS — E1010 Type 1 diabetes mellitus with ketoacidosis without coma: Secondary | ICD-10-CM

## 2018-12-09 DIAGNOSIS — R6520 Severe sepsis without septic shock: Secondary | ICD-10-CM

## 2018-12-09 LAB — BETA-HYDROXYBUTYRIC ACID
Beta-Hydroxybutyric Acid: 0.4 mmol/L — ABNORMAL HIGH (ref 0.05–0.27)
Beta-Hydroxybutyric Acid: 0.06 mmol/L (ref 0.05–0.27)

## 2018-12-09 LAB — POCT I-STAT 7, (LYTES, BLD GAS, ICA,H+H)
Acid-base deficit: 16 mmol/L — ABNORMAL HIGH (ref 0.0–2.0)
Bicarbonate: 11.2 mmol/L — ABNORMAL LOW (ref 20.0–28.0)
Calcium, Ion: 1.11 mmol/L — ABNORMAL LOW (ref 1.15–1.40)
HCT: 33 % — ABNORMAL LOW (ref 39.0–52.0)
Hemoglobin: 11.2 g/dL — ABNORMAL LOW (ref 13.0–17.0)
O2 Saturation: 94 %
Patient temperature: 36.2
Potassium: 3.6 mmol/L (ref 3.5–5.1)
Sodium: 144 mmol/L (ref 135–145)
TCO2: 12 mmol/L — ABNORMAL LOW (ref 22–32)
pCO2 arterial: 27.6 mmHg — ABNORMAL LOW (ref 32.0–48.0)
pH, Arterial: 7.21 — ABNORMAL LOW (ref 7.350–7.450)
pO2, Arterial: 81 mmHg — ABNORMAL LOW (ref 83.0–108.0)

## 2018-12-09 LAB — BLOOD CULTURE ID PANEL (REFLEXED)

## 2018-12-09 LAB — BASIC METABOLIC PANEL
Anion gap: 21 — ABNORMAL HIGH (ref 5–15)
Anion gap: 24 — ABNORMAL HIGH (ref 5–15)
Anion gap: 17 — ABNORMAL HIGH (ref 5–15)
BUN: 166 mg/dL — ABNORMAL HIGH (ref 6–20)
BUN: 160 mg/dL — ABNORMAL HIGH (ref 6–20)
BUN: 156 mg/dL — ABNORMAL HIGH (ref 6–20)
CO2: 9 mmol/L — ABNORMAL LOW (ref 22–32)
CO2: 8 mmol/L — ABNORMAL LOW (ref 22–32)
CO2: 10 mmol/L — ABNORMAL LOW (ref 22–32)
Calcium: 8.3 mg/dL — ABNORMAL LOW (ref 8.9–10.3)
Calcium: 8 mg/dL — ABNORMAL LOW (ref 8.9–10.3)
Calcium: 7.4 mg/dL — ABNORMAL LOW (ref 8.9–10.3)
Chloride: 115 mmol/L — ABNORMAL HIGH (ref 98–111)
Chloride: 116 mmol/L — ABNORMAL HIGH (ref 98–111)
Chloride: 119 mmol/L — ABNORMAL HIGH (ref 98–111)
Creatinine, Ser: 10.82 mg/dL — ABNORMAL HIGH (ref 0.61–1.24)
Creatinine, Ser: 10.98 mg/dL — ABNORMAL HIGH (ref 0.61–1.24)
Creatinine, Ser: 10.3 mg/dL — ABNORMAL HIGH (ref 0.61–1.24)
GFR calc Af Amer: 5 mL/min — ABNORMAL LOW (ref >60)
GFR calc Af Amer: 5 mL/min — ABNORMAL LOW (ref >60)
GFR calc Af Amer: 6 mL/min — ABNORMAL LOW (ref >60)
GFR calc non Af Amer: 5 mL/min — ABNORMAL LOW (ref >60)
GFR calc non Af Amer: 5 mL/min — ABNORMAL LOW (ref >60)
GFR calc non Af Amer: 5 mL/min — ABNORMAL LOW (ref >60)
Glucose, Bld: 200 mg/dL — ABNORMAL HIGH (ref 70–99)
Glucose, Bld: 154 mg/dL — ABNORMAL HIGH (ref 70–99)
Glucose, Bld: 170 mg/dL — ABNORMAL HIGH (ref 70–99)
Potassium: 4.9 mmol/L (ref 3.5–5.1)
Potassium: 4.8 mmol/L (ref 3.5–5.1)
Potassium: 3.9 mmol/L (ref 3.5–5.1)
Sodium: 145 mmol/L (ref 135–145)
Sodium: 148 mmol/L — ABNORMAL HIGH (ref 135–145)
Sodium: 146 mmol/L — ABNORMAL HIGH (ref 135–145)

## 2018-12-09 LAB — GLUCOSE, CAPILLARY
Glucose-Capillary: 147 mg/dL — ABNORMAL HIGH (ref 70–99)
Glucose-Capillary: 149 mg/dL — ABNORMAL HIGH (ref 70–99)
Glucose-Capillary: 149 mg/dL — ABNORMAL HIGH (ref 70–99)
Glucose-Capillary: 149 mg/dL — ABNORMAL HIGH (ref 70–99)
Glucose-Capillary: 150 mg/dL — ABNORMAL HIGH (ref 70–99)
Glucose-Capillary: 158 mg/dL — ABNORMAL HIGH (ref 70–99)
Glucose-Capillary: 175 mg/dL — ABNORMAL HIGH (ref 70–99)
Glucose-Capillary: 180 mg/dL — ABNORMAL HIGH (ref 70–99)
Glucose-Capillary: 181 mg/dL — ABNORMAL HIGH (ref 70–99)
Glucose-Capillary: 181 mg/dL — ABNORMAL HIGH (ref 70–99)
Glucose-Capillary: 182 mg/dL — ABNORMAL HIGH (ref 70–99)
Glucose-Capillary: 183 mg/dL — ABNORMAL HIGH (ref 70–99)
Glucose-Capillary: 191 mg/dL — ABNORMAL HIGH (ref 70–99)
Glucose-Capillary: 198 mg/dL — ABNORMAL HIGH (ref 70–99)
Glucose-Capillary: 203 mg/dL — ABNORMAL HIGH (ref 70–99)
Glucose-Capillary: 207 mg/dL — ABNORMAL HIGH (ref 70–99)

## 2018-12-09 LAB — BLOOD GAS, ARTERIAL
Acid-base deficit: 14.4 mmol/L — ABNORMAL HIGH (ref 0.0–2.0)
Bicarbonate: 11.9 mmol/L — ABNORMAL LOW (ref 20.0–28.0)
FIO2: 24
O2 Saturation: 96.5 %
Patient temperature: 36.1
pCO2 arterial: 29.4 mmHg — ABNORMAL LOW (ref 32.0–48.0)
pH, Arterial: 7.225 — ABNORMAL LOW (ref 7.350–7.450)
pO2, Arterial: 88.2 mmHg (ref 83.0–108.0)

## 2018-12-09 LAB — BASIC METABOLIC PANEL WITH GFR
Anion gap: 16 — ABNORMAL HIGH (ref 5–15)
BUN: 153 mg/dL — ABNORMAL HIGH (ref 6–20)
CO2: 13 mmol/L — ABNORMAL LOW (ref 22–32)
Calcium: 7.5 mg/dL — ABNORMAL LOW (ref 8.9–10.3)
Chloride: 113 mmol/L — ABNORMAL HIGH (ref 98–111)
Creatinine, Ser: 9.88 mg/dL — ABNORMAL HIGH (ref 0.61–1.24)
GFR calc Af Amer: 6 mL/min — ABNORMAL LOW
GFR calc non Af Amer: 5 mL/min — ABNORMAL LOW
Glucose, Bld: 493 mg/dL — ABNORMAL HIGH (ref 70–99)
Potassium: 4.2 mmol/L (ref 3.5–5.1)
Sodium: 142 mmol/L (ref 135–145)

## 2018-12-09 LAB — HEPARIN LEVEL (UNFRACTIONATED)
Heparin Unfractionated: 1.44 IU/mL — ABNORMAL HIGH (ref 0.30–0.70)
Heparin Unfractionated: 1.56 [IU]/mL — ABNORMAL HIGH (ref 0.30–0.70)
Heparin Unfractionated: 0.77 [IU]/mL — ABNORMAL HIGH (ref 0.30–0.70)

## 2018-12-09 LAB — LACTIC ACID, PLASMA: Lactic Acid, Venous: 1.2 mmol/L (ref 0.5–1.9)

## 2018-12-09 LAB — PROCALCITONIN: Procalcitonin: 54.88 ng/mL

## 2018-12-09 LAB — TROPONIN I (HIGH SENSITIVITY): Troponin I (High Sensitivity): 76 ng/L — ABNORMAL HIGH (ref <18)

## 2018-12-09 LAB — APTT: aPTT: 173 seconds (ref 24–36)

## 2018-12-09 MED ORDER — DEXTROSE-NACL 5-0.45 % IV SOLN
INTRAVENOUS | Status: DC
  Administered 2018-12-09 (×2): via INTRAVENOUS

## 2018-12-09 MED ORDER — LACTATED RINGERS IV SOLN
INTRAVENOUS | Status: DC
  Administered 2018-12-09: 09:00:00 via INTRAVENOUS

## 2018-12-09 MED ORDER — SODIUM CHLORIDE 0.9 % IV BOLUS: 1000.0000 mL | Freq: Once | INTRAVENOUS | Status: DC

## 2018-12-09 MED ORDER — HEPARIN (PORCINE) 25000 UT/250ML-% IV SOLN
1500.0000 [IU]/h | INTRAVENOUS | Status: DC
  Administered 2018-12-09: 900 [IU]/h via INTRAVENOUS
  Administered 2018-12-10: 12:00:00 950 [IU]/h via INTRAVENOUS
  Administered 2018-12-11 – 2018-12-12 (×2): 1150 [IU]/h via INTRAVENOUS
  Administered 2018-12-13: 11:00:00 1300 [IU]/h via INTRAVENOUS
  Administered 2018-12-14: 07:00:00 1450 [IU]/h via INTRAVENOUS
  Administered 2018-12-14 – 2018-12-17 (×4): 1500 [IU]/h via INTRAVENOUS
  Filled 2018-12-09 (×11): qty 250

## 2018-12-09 MED ORDER — SODIUM CHLORIDE 0.9 % IV BOLUS
1000.0000 mL | Freq: Once | INTRAVENOUS | Status: AC
  Administered 2018-12-09: 16:00:00 1000 mL via INTRAVENOUS

## 2018-12-09 MED ORDER — INSULIN DETEMIR 100 UNIT/ML ~~LOC~~ SOLN: 30.0000 [IU] | Freq: Every day | SUBCUTANEOUS | Status: DC

## 2018-12-09 MED ORDER — SODIUM BICARBONATE 8.4 % IV SOLN
100.0000 meq | Freq: Once | INTRAVENOUS | Status: AC
  Administered 2018-12-09: 23:00:00 100 meq via INTRAVENOUS

## 2018-12-09 MED ORDER — SODIUM BICARBONATE 8.4 % IV SOLN
INTRAVENOUS | Status: AC
  Filled 2018-12-09: qty 50

## 2018-12-09 MED ORDER — SODIUM CHLORIDE 0.9 % IV BOLUS
500.0000 mL | Freq: Once | INTRAVENOUS | Status: AC
  Administered 2018-12-09: 500 mL via INTRAVENOUS

## 2018-12-09 MED ORDER — SODIUM BICARBONATE 8.4 % IV SOLN: Freq: Once | INTRAVENOUS | Status: DC

## 2018-12-09 MED ORDER — SODIUM BICARBONATE 8.4 % IV SOLN
INTRAVENOUS | Status: DC
  Administered 2018-12-09: 21:00:00 via INTRAVENOUS
  Filled 2018-12-09 (×2): qty 100

## 2018-12-09 MED ORDER — SODIUM CHLORIDE 0.9 % IV SOLN
2.0000 g | INTRAVENOUS | Status: DC
  Administered 2018-12-09 – 2018-12-14 (×6): 2 g via INTRAVENOUS
  Filled 2018-12-09 (×6): qty 20

## 2018-12-09 MED ORDER — SODIUM BICARBONATE 8.4 % IV SOLN
50.0000 meq | Freq: Once | INTRAVENOUS | Status: AC
  Administered 2018-12-09: 05:00:00 50 meq via INTRAVENOUS
  Filled 2018-12-09: qty 50

## 2018-12-09 NOTE — Consult Note (Signed)
[facility] Renal Consultation Note  Requesting MD: Ina Homes, MD  Indication for Consultation:  AKI   Chief complaint: altered mental status  HPI: Timothy Madden is a 58 y.o. male with a history of diabetes poorly controlled as well as reported psychiatric issues who presented from home with altered mental status.  He was felt to be in DKA/HONK.  Urine ketones negative.  He was initiated on fluids and insulin.  Nephrology is consulted for assistance with management of AKI.  His creatinine was initially 12.91 on 11/28 and has improved to 9.88 on this morning's labs.  Note that his initial potassium was 7.2 with no hemolysis.  His initial glucose was 1681; presenting BUN was 182 serum bicarbonate 10.  Lactic acid initially elevated at 4.6. he had 750 mL UOP over 11/28.  He is not able to provide additional history.  He states that he is not aware of his name.  Note there has been concern in the past for his ability to care for himself   Creatinine, Ser  Date/Time Value Ref Range Status  12/09/2018 06:10 AM 9.88 (H) 0.61 - 1.24 mg/dL Final  12/09/2018 02:14 AM 10.82 (H) 0.61 - 1.24 mg/dL Final  12/08/2018 09:44 PM 10.75 (H) 0.61 - 1.24 mg/dL Final  12/08/2018 06:26 PM 11.20 (H) 0.61 - 1.24 mg/dL Final  12/08/2018 03:18 PM 11.33 (H) 0.61 - 1.24 mg/dL Final  12/08/2018 12:01 PM 12.91 (H) 0.61 - 1.24 mg/dL Final  11/13/2018 04:55 AM 2.40 (H) 0.61 - 1.24 mg/dL Final  11/12/2018 05:49 AM 2.49 (H) 0.61 - 1.24 mg/dL Final  11/11/2018 05:25 AM 2.50 (H) 0.61 - 1.24 mg/dL Final  11/10/2018 12:28 PM 2.53 (H) 0.61 - 1.24 mg/dL Final  11/10/2018 11:57 AM 2.56 (H) 0.61 - 1.24 mg/dL Final  11/10/2018 04:37 AM 2.76 (H) 0.61 - 1.24 mg/dL Final  09/13/2018 01:13 PM 2.71 (H) 0.61 - 1.24 mg/dL Final  08/22/2018 12:14 PM 2.47 (H) 0.61 - 1.24 mg/dL Final  07/18/2018 11:52 PM 2.72 (H) 0.61 - 1.24 mg/dL Final  07/03/2018 08:03 PM 3.69 (H) 0.61 - 1.24 mg/dL Final  07/02/2018 02:42 AM 3.62 (H)  0.61 - 1.24 mg/dL Final  07/01/2018 02:42 AM 3.32 (H) 0.61 - 1.24 mg/dL Final  06/30/2018 03:39 AM 4.01 (H) 0.61 - 1.24 mg/dL Final  06/29/2018 07:05 PM 4.26 (H) 0.61 - 1.24 mg/dL Final  06/29/2018 04:50 AM 4.96 (H) 0.61 - 1.24 mg/dL Final  06/28/2018 11:58 PM 5.26 (H) 0.61 - 1.24 mg/dL Final  06/28/2018 06:22 PM 5.86 (H) 0.61 - 1.24 mg/dL Final  06/28/2018 01:18 PM 6.75 (H) 0.61 - 1.24 mg/dL Final  06/29/2015 12:06 PM 1.79 (H) 0.61 - 1.24 mg/dL Final  11/02/2013 03:16 AM 2.08 (H) 0.50 - 1.35 mg/dL Final  11/01/2013 04:05 AM 2.16 (H) 0.50 - 1.35 mg/dL Final  10/31/2013 05:43 AM 1.95 (H) 0.50 - 1.35 mg/dL Final  10/30/2013 07:41 AM 1.65 (H) 0.50 - 1.35 mg/dL Final  10/29/2013 05:42 AM 1.76 (H) 0.50 - 1.35 mg/dL Final  10/28/2013 05:00 AM 1.68 (H) 0.50 - 1.35 mg/dL Final  10/27/2013 10:30 AM 2.03 (H) 0.50 - 1.35 mg/dL Final  10/26/2013 04:18 AM 2.10 (H) 0.50 - 1.35 mg/dL Final  10/25/2013 10:50 PM 2.42 (H) 0.50 - 1.35 mg/dL Final  11/20/2007 07:50 PM 0.85  Final  07/19/2006 08:54 PM 0.90 0.40 - 1.50 mg/dL Final  01/23/2006 07:16 PM 0.85 0.40 - 1.50 mg/dL Final     PMHx:   Past Medical History:  Diagnosis Date  . Amputation of finger, left   . Complication of anesthesia   . Diabetes mellitus without complication ([place])   . DKA (diabetic ketoacidoses) ([place]) 06/2018  . PONV (postoperative nausea and vomiting)     Past Surgical History:  Procedure Laterality Date  . I&D EXTREMITY Left 10/26/2013   Procedure: IRRIGATION AND DEBRIDEMENT EXTREMITY,PARTIAL AMPUTATION LEFT INDEX FINGER;  Surgeon: Dayna Barker, MD;  Location: [facility];  Service: Plastics;  Laterality: Left;  . I&D EXTREMITY Left 10/30/2013   Procedure: IRRIGATION AND DEBRIDEMENT AND REVISION AMPUTATION OF LEFT INDEX FINGER;  Surgeon: Dayna Barker, MD;  Location: [facility];  Service: Plastics;  Laterality: Left;    Family Hx:  Family History  Problem Relation Age of Onset  . Hypertension Mother   . Diabetes Father   .  Diabetes Sister   . Diabetes Brother   . Heart disease Brother     Social History:  reports that he has been smoking cigarettes. He has a 20.00 pack-year smoking history. He has never used smokeless tobacco. He reports that he does not drink alcohol or use drugs.  Allergies: No Known Allergies  Medications: Prior to Admission medications   Medication Sig Start Date End Date Taking? Authorizing Provider  blood glucose meter kit and supplies KIT Dispense based on patient and insurance preference. Use up to four times daily as directed. (FOR ICD-9 250.00, 250.01). 07/02/18   Aline August, MD  insulin aspart (NOVOLOG) 100 UNIT/ML injection Inject 5 Units into the skin 3 (three) times daily with meals. Patient not taking: Reported on 12/09/2018 11/15/18   Verlee Monte, MD  insulin detemir (LEVEMIR) 100 UNIT/ML injection Inject 0.3 mLs (30 Units total) into the skin daily. Patient not taking: Reported on 12/09/2018 11/15/18   Verlee Monte, MD  Insulin Syringe-Needle U-100 (INSULIN SYRINGE .5CC/31GX5/16") 31G X 5/16" 0.5 ML MISC Levemir 30 units daily and NovoLog 5 units 3 times a day with meals Patient not taking: Reported on 12/09/2018 07/02/18   Aline August, MD  Rivaroxaban 15 & 20 MG TBPK Follow package directions: Take one 62m tablet by mouth twice a day. On day 22, switch to one 223mtablet once a day. Take with food. Patient not taking: Reported on 12/09/2018 11/15/18   ElVerlee MonteMD  tamsulosin (FLOMAX) 0.4 MG CAPS capsule Take 1 capsule (0.4 mg total) by mouth daily after supper. Patient not taking: Reported on 12/09/2018 07/02/18   AlAline AugustMD    I have reviewed the patient's current and reported prior to admission medications.  Labs:  BMP Latest Ref Rng & Units 12/09/2018 12/09/2018 12/08/2018  Glucose 70 - 99 mg/dL 493(H) 200(H) 245(H)  BUN 6 - 20 mg/dL 153(H) 166(H) 164(H)  Creatinine 0.61 - 1.24 mg/dL 9.88(H) 10.82(H) 10.75(H)  Sodium 135 - 145 mmol/L 142 145 146(H)   Potassium 3.5 - 5.1 mmol/L 4.2 4.9 4.4  Chloride 98 - 111 mmol/L 113(H) 115(H) 116(H)  CO2 22 - 32 mmol/L 13(L) 9(L) 12(L)  Calcium 8.9 - 10.3 mg/dL 7.5(L) 8.3(L) 8.5(L)    Urinalysis    Component Value Date/Time   COLORURINE YELLOW 12/08/2018 1102   APPEARANCEUR TURBID (A) 12/08/2018 1102   LABSPEC 1.012 12/08/2018 1102   PHURINE 7.0 12/08/2018 1102   GLUCOSEU >=500 (A) 12/08/2018 1102   HGBUR LARGE (A) 12/08/2018 1102   BILIRUBINUR NEGATIVE 12/08/2018 1102   KEDelta1/28/2020 1102   PROTEINUR 100 (A) 12/08/2018 1102   UROBILINOGEN 0.2 10/26/2013 0034   NITRITE NEGATIVE 12/08/2018  [place] (A) 12/08/2018 1102     ROS: Unable to obtain secondary to altered mental status  Physical Exam: Vitals:   12/09/18 1300 12/09/18 1400  BP: (!) 151/79 (!) 143/78  Pulse: 81 80  Resp: (!) 23 17  Temp: (!) 95.9 F (35.5 C) (!) 96.6 F (35.9 C)  SpO2: 100% 100%     General: Adult male in bed in no acute distress at rest HEENT: Normocephalic atraumatic mucous membranes Eyes: EOMI sclera anicteric Neck: Supple no JVD Heart: Regular rate and rhythm no rubs appreciated Lungs: Clear to auscultation bilaterally normal work of breathing at rest Abdomen: soft/NT/ND; normal bowel sounds Extremities: no edema no cyanosis or clubbing Skin: dry skin and MM Neuro: disoriented - does not know name; limited speech   Assessment/Plan:  # AKI  - secondary to pre-renal insults in the setting of HONK/uncontrolled DM.  Note hx of what appears to be CKD stage IV as below.  UA with RBC possibly s/p foley.  184m/dL protein - He appears volume depleted.  We will continue fluids - Would avoid LR given his AKI  - Bolus normal saline 1 L now - Monitor for acute indication for dialysis - renal function is improving with supportive care though remains marginal  -Continue fluids as per DKA protocols - BMP now   # HONK/DKA - Insulin per primary team    # CKD stage III   - near CKD stage IV - secondary to diabetic nephropathy as well as hx of AKI  - Baseline Cr appears near 2.5-2.7 recently    # HTN with CKD  - controlled   #  AMS  -Persistent.  Setting of psychiatric illness as well as honk/DKA and AKI profoundly elevated BUN  # UTI  - on ceftriaxone   # Psychiatric illness  - Noted issues with self care reported   LClaudia Desanctis11/29/2020, 3:31 PM

## 2018-12-09 NOTE — Progress Notes (Signed)
Notified Dr. Tamala Julian about patients most recent BMP results and anion gap result of 24. Patient has been on insulin drip with q1h CBGs ranging from 140-180s. Physician to place orders.

## 2018-12-09 NOTE — Progress Notes (Signed)
CRITICAL VALUE ALERT  Critical Value:  APTT  Date & Time Notied: 12/09/18 0200  Provider Notified: Pharmacy   Orders Received/Actions taken: Re-draw heparin levels

## 2018-12-09 NOTE — Progress Notes (Signed)
ANTICOAGULATION CONSULT NOTE   Pharmacy Consult for Heparin  Indication: DVT  No Known Allergies  Patient Measurements: Height: 6\' 3"  (190.5 cm) Weight: 110 lb 3.7 oz (50 kg) IBW/kg (Calculated) : 84.5 Heparin Dosing Weight: 86.9  Vital Signs: Temp: 97 F (36.1 C) (11/29 0600) Temp Source: Bladder (11/28 2300) BP: 134/69 (11/29 0600) Pulse Rate: 86 (11/29 0600)  Labs: Recent Labs    12/08/18 1201 12/08/18 1224 12/08/18 1351  12/08/18 1826 12/08/18 2144 12/09/18 0014 12/09/18 0214  HGB 12.3* 14.6  --   --   --   --   --   --   HCT 42.8 43.0  --   --   --   --   --   --   PLT 573*  --   --   --   --   --   --   --   APTT 27  --   --   --   --   --  173*  --   LABPROT 18.3*  --   --   --   --   --   --   --   INR 1.5*  --   --   --   --   --   --   --   HEPARINUNFRC <0.10*  --   --   --   --   --  1.44* 1.56*  CREATININE 12.91*  --   --    < > 11.20* 10.75*  --  10.82*  TROPONINIHS 30*  --  31*  --   --   --   --   --    < > = values in this interval not displayed.    Estimated Creatinine Clearance: 5.3 mL/min (A) (by C-G formula based on SCr of 10.82 mg/dL (H)).  Assessment: Patient is a 72 yom that was recently admitted at the end of last month and was diagnosed with a DVT. The patient was discharged on Xarelto. At this time the patient is unable to take PO medications and pharmacy has been consulted to dose heparin at this time.  Heparin level this afternoon resulted as SUPRAtherapeutic but trended down after a rate decrease earlier today (HL 0.77 << 1.56, goal of 0.3-0.7). CBC wnl on admit - no bleeding noted  Goal of Therapy:  Heparin level 0.3-0.7 units/ml Monitor platelets by anticoagulation protocol: Yes   Plan:  - Decrease Heparin to 850 units/hr (8.5 ml/hr) - Will continue to monitor for any signs/symptoms of bleeding and will follow up with heparin level in 8 hours   Thank you for allowing pharmacy to be a part of this patient's care.  Alycia Rossetti, PharmD, BCPS Clinical Pharmacist Clinical phone for 12/09/2018: [phone] 12/09/2018 8:02 AM   **Pharmacist phone directory can now be found on [email or web address] ([email or web address]).  Listed under [place].

## 2018-12-09 NOTE — Progress Notes (Signed)
ANTICOAGULATION CONSULT NOTE   Pharmacy Consult for Heparin  Indication: DVT  No Known Allergies  Patient Measurements: Height: 6\' 3"  (190.5 cm) Weight: 110 lb 3.7 oz (50 kg) IBW/kg (Calculated) : 84.5 Heparin Dosing Weight: 86.9  Vital Signs: Temp: 97.5 F (36.4 C) (11/29 0300) Temp Source: Bladder (11/28 2300) BP: 137/74 (11/29 0300) Pulse Rate: 96 (11/29 0300)  Labs: Recent Labs    12/08/18 1201 12/08/18 1224 12/08/18 1351  12/08/18 1826 12/08/18 2144 12/09/18 0014 12/09/18 0214  HGB 12.3* 14.6  --   --   --   --   --   --   HCT 42.8 43.0  --   --   --   --   --   --   PLT 573*  --   --   --   --   --   --   --   APTT 27  --   --   --   --   --  173*  --   LABPROT 18.3*  --   --   --   --   --   --   --   INR 1.5*  --   --   --   --   --   --   --   HEPARINUNFRC <0.10*  --   --   --   --   --  1.44* 1.56*  CREATININE 12.91*  --   --    < > 11.20* 10.75*  --  10.82*  TROPONINIHS 30*  --  31*  --   --   --   --   --    < > = values in this interval not displayed.    Estimated Creatinine Clearance: 5.3 mL/min (A) (by C-G formula based on SCr of 10.82 mg/dL (H)).  Assessment: Patient is a 65 yom that was recently admitted at the end of last month and was diagnosed with a DVT. The patient was discharged on Xarelto. At this time the patient is unable to take PO medications and pharmacy has been consulted to dose heparin at this time. Patient is unable to tell me when his last dose of xarelto was.  Initial heparin level 1.44 units/ml.  Redraw 1.56 units/ml  Noticed discrepancy in todays weight and previously recorded weight.  Will use 50 kg as dosing weight  Goal of Therapy:  Heparin level 0.3-0.7 units/ml Monitor platelets by anticoagulation protocol: Yes   Plan:  Hold heparin for 1 hour Heparin drip at 900 units/hr when restarted Check heparin level 8 hours after restart Daily HL and CBC while on heparin  Mikhayla Phillis, Alex Gardener PharmD 12/09/2018,4:14 AM

## 2018-12-09 NOTE — Progress Notes (Signed)
RN noted an increase in T waves on monitor and 12-lead was obtained. 12-lead showed critical results of an "Acute MI". E-link called and notified to confirm. Waiting for orders.

## 2018-12-09 NOTE — Progress Notes (Signed)
[facility] Progress Note Patient Name: Timothy Madden DOB: [birth year] MRN: [id number]   Date of Service  12/09/2018  HPI/Events of Note  EKG: reviewed and compared to 28th showing improved T peak-resolved. Concave upward inferior ST up. Does not look like typical STEMI.   eICU Interventions  - get stat troponin. 2 prior troponin on 28 th 30 to 39.  VBg not sent yet.  If troponin doubling, need cardiology consult immediately.      Intervention Category Intermediate Interventions: Other: Minor Interventions: Communication with other healthcare providers and/or family  Elmer Sow 12/09/2018, 6:31 AM

## 2018-12-09 NOTE — Progress Notes (Signed)
NAME:  Timothy Madden, MRN:  [id number], DOB:  [birth year], LOS: 1 ADMISSION DATE:  12/08/2018, CONSULTATION DATE:  12/08/18 REFERRING MD:  ED, CHIEF COMPLAINT:  AMS   Brief History   This is a 58 year old man with a history of numerous psychiatric issues, diabetes poorly controlled supposed to be on home insulin presenting with obtundation from home found to be in severe DKA/HOCM.  History of present illness   This is a 58 year old man with a history of numerous psychiatric issues, diabetes poorly controlled supposed to be on home insulin presenting with obtundation from home found to be in severe DKA/HOCM.  History is per chart review as patient is extremely poorly responsive.  Numerous other electrolyte abnormalities were noted which are also consistent with DKA.  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  Objective   Blood pressure 134/69, pulse 84, temperature (!) 96.4 F (35.8 C), resp. rate 18, height 6\' 3"  (1.905 m), weight 50 kg, SpO2 100 %.        Intake/Output Summary (Last 24 hours) at 12/09/2018 0834 Last data filed at 12/09/2018 0600 Gross per 24 hour  Intake 5754.06 ml   Output 750 ml  Net 5004.06 ml   Filed Weights   12/08/18 1200 12/08/18 2050 12/09/18 0300  Weight: 86.9 kg 60.8 kg 50 kg    Examination: General: Ill-appearing middle-aged man laying in bed HENT: Mucous membranes dry, trachea midline, temporal wasting noted Lungs: Still retained upper airway secretions causing referred breath sounds down lower, less tachypneic than yesterday Cardiovascular: RRR, he has a systolic murmur, extremities are warm Abdomen: Soft, hypoactive bowel sounds Extremities: Muscle wasting without edema, he has missing left finger, severe changes of diabetic neuropathy and joint issues Neuro: He will withdraw to pain x4 extremities  BUN/Cr minimally improved Latest CBG 183  Resolved Hospital Problem list   NA  Assessment & Plan:  # Severe sepsis from complicated UTI with bacteremia, Enterobacter in blood, has history of recurrent issues with this # HOCM/DKA overlap, mostly former,on insulin drip, improving # Acute metabolic encephalopathy, improved but I still don't think he can take PO # Numerous psych issues limiting compliance # Continued severe renal failure, nonoliguric, hopeful we can avoid HD # Lactic and ketoacidosis, improved # Hx recent DVT, doubt home AC compliance  - Usual DKA endotool pathway, not switching over until mental status improves - Nephrology consult given severity of uremia - Liberal fluids, start LR in addition to fluids in DKA pathway - Trend K/Cr/UoP, continue foley given severity of renal injury - Broad spectrum abx, f/u blood/urine cultures - Diabetes educator consult - Not sure what can be done to prevent readmission down the line  Best practice:  Diet: NPO due to mental status Pain/Anxiety/Delirium protocol (if indicated): Avoid all VAP protocol (if  indicated): N/A, aspiration precautions DVT prophylaxis: heparin gtt GI prophylaxis: PPI Glucose control: insulin gtt Mobility: BR Code Status: full Disposition: ICU   Critical care time: 33 minutes

## 2018-12-10 ENCOUNTER — Inpatient Hospital Stay (HOSPITAL_COMMUNITY): Payer: Medicaid Other

## 2018-12-10 DIAGNOSIS — N179 Acute kidney failure, unspecified: Secondary | ICD-10-CM

## 2018-12-10 LAB — RENAL FUNCTION PANEL
Albumin: 1.4 g/dL — ABNORMAL LOW (ref 3.5–5.0)
Anion gap: 15 (ref 5–15)
BUN: 141 mg/dL — ABNORMAL HIGH (ref 6–20)
CO2: 16 mmol/L — ABNORMAL LOW (ref 22–32)
Calcium: 7 mg/dL — ABNORMAL LOW (ref 8.9–10.3)
Chloride: 112 mmol/L — ABNORMAL HIGH (ref 98–111)
Creatinine, Ser: 9.87 mg/dL — ABNORMAL HIGH (ref 0.61–1.24)
GFR calc Af Amer: 6 mL/min — ABNORMAL LOW (ref >60)
GFR calc non Af Amer: 5 mL/min — ABNORMAL LOW (ref >60)
Glucose, Bld: 196 mg/dL — ABNORMAL HIGH (ref 70–99)
Phosphorus: 4.1 mg/dL (ref 2.5–4.6)
Potassium: 3 mmol/L — ABNORMAL LOW (ref 3.5–5.1)
Sodium: 143 mmol/L (ref 135–145)

## 2018-12-10 LAB — POCT I-STAT 7, (LYTES, BLD GAS, ICA,H+H)
Acid-base deficit: 10 mmol/L — ABNORMAL HIGH (ref 0.0–2.0)
Bicarbonate: 15 mmol/L — ABNORMAL LOW (ref 20.0–28.0)
Calcium, Ion: 1.05 mmol/L — ABNORMAL LOW (ref 1.15–1.40)
HCT: 24 % — ABNORMAL LOW (ref 39.0–52.0)
Hemoglobin: 8.2 g/dL — ABNORMAL LOW (ref 13.0–17.0)
O2 Saturation: 94 %
Patient temperature: 36.2
Potassium: 3 mmol/L — ABNORMAL LOW (ref 3.5–5.1)
Sodium: 144 mmol/L (ref 135–145)
TCO2: 16 mmol/L — ABNORMAL LOW (ref 22–32)
pCO2 arterial: 29.3 mmHg — ABNORMAL LOW (ref 32.0–48.0)
pH, Arterial: 7.315 — ABNORMAL LOW (ref 7.350–7.450)
pO2, Arterial: 74 mmHg — ABNORMAL LOW (ref 83.0–108.0)

## 2018-12-10 LAB — GLUCOSE, CAPILLARY
Glucose-Capillary: 107 mg/dL — ABNORMAL HIGH (ref 70–99)
Glucose-Capillary: 116 mg/dL — ABNORMAL HIGH (ref 70–99)
Glucose-Capillary: 121 mg/dL — ABNORMAL HIGH (ref 70–99)
Glucose-Capillary: 133 mg/dL — ABNORMAL HIGH (ref 70–99)
Glucose-Capillary: 135 mg/dL — ABNORMAL HIGH (ref 70–99)
Glucose-Capillary: 144 mg/dL — ABNORMAL HIGH (ref 70–99)
Glucose-Capillary: 150 mg/dL — ABNORMAL HIGH (ref 70–99)
Glucose-Capillary: 169 mg/dL — ABNORMAL HIGH (ref 70–99)
Glucose-Capillary: 175 mg/dL — ABNORMAL HIGH (ref 70–99)
Glucose-Capillary: 176 mg/dL — ABNORMAL HIGH (ref 70–99)
Glucose-Capillary: 187 mg/dL — ABNORMAL HIGH (ref 70–99)
Glucose-Capillary: 188 mg/dL — ABNORMAL HIGH (ref 70–99)
Glucose-Capillary: 189 mg/dL — ABNORMAL HIGH (ref 70–99)
Glucose-Capillary: 193 mg/dL — ABNORMAL HIGH (ref 70–99)
Glucose-Capillary: 194 mg/dL — ABNORMAL HIGH (ref 70–99)
Glucose-Capillary: 198 mg/dL — ABNORMAL HIGH (ref 70–99)
Glucose-Capillary: 201 mg/dL — ABNORMAL HIGH (ref 70–99)

## 2018-12-10 LAB — BASIC METABOLIC PANEL
Anion gap: 16 — ABNORMAL HIGH (ref 5–15)
Anion gap: 15 (ref 5–15)
BUN: 128 mg/dL — ABNORMAL HIGH (ref 6–20)
BUN: 135 mg/dL — ABNORMAL HIGH (ref 6–20)
CO2: 19 mmol/L — ABNORMAL LOW (ref 22–32)
CO2: 22 mmol/L (ref 22–32)
Calcium: 7 mg/dL — ABNORMAL LOW (ref 8.9–10.3)
Calcium: 7.1 mg/dL — ABNORMAL LOW (ref 8.9–10.3)
Chloride: 109 mmol/L (ref 98–111)
Chloride: 105 mmol/L (ref 98–111)
Creatinine, Ser: 8.98 mg/dL — ABNORMAL HIGH (ref 0.61–1.24)
Creatinine, Ser: 9.56 mg/dL — ABNORMAL HIGH (ref 0.61–1.24)
GFR calc Af Amer: 7 mL/min — ABNORMAL LOW (ref >60)
GFR calc Af Amer: 6 mL/min — ABNORMAL LOW (ref >60)
GFR calc non Af Amer: 6 mL/min — ABNORMAL LOW (ref >60)
GFR calc non Af Amer: 5 mL/min — ABNORMAL LOW (ref >60)
Glucose, Bld: 158 mg/dL — ABNORMAL HIGH (ref 70–99)
Glucose, Bld: 120 mg/dL — ABNORMAL HIGH (ref 70–99)
Potassium: 2.9 mmol/L — ABNORMAL LOW (ref 3.5–5.1)
Potassium: 2.8 mmol/L — ABNORMAL LOW (ref 3.5–5.1)
Sodium: 144 mmol/L (ref 135–145)
Sodium: 142 mmol/L (ref 135–145)

## 2018-12-10 LAB — HEPARIN LEVEL (UNFRACTIONATED)
Heparin Unfractionated: 0.22 IU/mL — ABNORMAL LOW (ref 0.30–0.70)
Heparin Unfractionated: 0.28 [IU]/mL — ABNORMAL LOW (ref 0.30–0.70)
Heparin Unfractionated: 0.35 IU/mL (ref 0.30–0.70)

## 2018-12-10 LAB — TROPONIN I (HIGH SENSITIVITY)
Troponin I (High Sensitivity): 54 ng/L — ABNORMAL HIGH
Troponin I (High Sensitivity): 54 ng/L — ABNORMAL HIGH

## 2018-12-10 LAB — PROCALCITONIN: Procalcitonin: 48.11 ng/mL

## 2018-12-10 LAB — BETA-HYDROXYBUTYRIC ACID: Beta-Hydroxybutyric Acid: <0.05 mmol/L — ABNORMAL LOW (ref 0.05–0.27)

## 2018-12-10 MED ORDER — POTASSIUM CHLORIDE CRYS ER 20 MEQ PO TBCR
40.0000 meq | EXTENDED_RELEASE_TABLET | Freq: Once | ORAL | Status: DC
  Filled 2018-12-10: qty 2

## 2018-12-10 MED ORDER — HEPARIN BOLUS VIA INFUSION
750.0000 [IU] | Freq: Once | INTRAVENOUS | Status: AC
  Administered 2018-12-10: 08:00:00 750 [IU] via INTRAVENOUS
  Filled 2018-12-10: qty 750

## 2018-12-10 MED ORDER — LABETALOL HCL 5 MG/ML IV SOLN: 10.0000 mg | INTRAVENOUS | Status: DC | PRN

## 2018-12-10 MED ORDER — STERILE WATER FOR INJECTION IV SOLN
INTRAVENOUS | Status: DC
  Administered 2018-12-10 – 2018-12-12 (×5): via INTRAVENOUS
  Filled 2018-12-10 (×8): qty 850

## 2018-12-10 MED ORDER — INSULIN DETEMIR 100 UNIT/ML ~~LOC~~ SOLN
10.0000 [IU] | SUBCUTANEOUS | Status: DC
  Administered 2018-12-10 – 2018-12-16 (×6): 10 [IU] via SUBCUTANEOUS
  Filled 2018-12-10 (×7): qty 0.1

## 2018-12-10 MED ORDER — NITROGLYCERIN 0.4 MG SL SUBL
0.4000 mg | SUBLINGUAL_TABLET | SUBLINGUAL | Status: DC | PRN
  Administered 2018-12-10 (×3): 0.4 mg via SUBLINGUAL
  Filled 2018-12-10: qty 1

## 2018-12-10 MED ORDER — POTASSIUM CHLORIDE CRYS ER 20 MEQ PO TBCR
40.0000 meq | EXTENDED_RELEASE_TABLET | Freq: Once | ORAL | Status: AC
  Administered 2018-12-10: 10:00:00 40 meq via ORAL
  Filled 2018-12-10: qty 2

## 2018-12-10 MED ORDER — CALCIUM GLUCONATE-NACL 1-0.675 GM/50ML-% IV SOLN
1.0000 g | Freq: Once | INTRAVENOUS | Status: AC
  Administered 2018-12-10: 10:00:00 1000 mg via INTRAVENOUS
  Filled 2018-12-10: qty 50

## 2018-12-10 MED ORDER — INSULIN ASPART 100 UNIT/ML ~~LOC~~ SOLN
0.0000 [IU] | SUBCUTANEOUS | Status: DC
  Administered 2018-12-10: 17:00:00 3 [IU] via SUBCUTANEOUS
  Administered 2018-12-10 – 2018-12-11 (×2): 2 [IU] via SUBCUTANEOUS
  Administered 2018-12-11: 20:00:00 15 [IU] via SUBCUTANEOUS
  Administered 2018-12-11: 12:00:00 3 [IU] via SUBCUTANEOUS
  Administered 2018-12-12: 13:00:00 11 [IU] via SUBCUTANEOUS
  Administered 2018-12-12: 5 [IU] via SUBCUTANEOUS
  Administered 2018-12-12: 2 [IU] via SUBCUTANEOUS

## 2018-12-10 MED ORDER — ACETAMINOPHEN 325 MG PO TABS
650.0000 mg | ORAL_TABLET | Freq: Four times a day (QID) | ORAL | Status: DC | PRN
  Administered 2018-12-16 – 2019-01-11 (×7): 650 mg via ORAL
  Filled 2018-12-10 (×7): qty 2

## 2018-12-10 MED ORDER — NITROGLYCERIN 2 % TD OINT
1.0000 [in_us] | TOPICAL_OINTMENT | Freq: Four times a day (QID) | TRANSDERMAL | Status: DC
  Administered 2018-12-10 – 2018-12-13 (×13): 1 [in_us] via TOPICAL
  Filled 2018-12-10: qty 30

## 2018-12-10 MED ORDER — PANTOPRAZOLE SODIUM 40 MG PO TBEC
40.0000 mg | DELAYED_RELEASE_TABLET | Freq: Every day | ORAL | Status: DC
  Administered 2018-12-10 – 2019-01-18 (×40): 40 mg via ORAL
  Filled 2018-12-10 (×41): qty 1

## 2018-12-10 NOTE — Progress Notes (Signed)
ANTICOAGULATION CONSULT NOTE   Pharmacy Consult for Heparin  Indication: DVT  No Known Allergies  Patient Measurements: Height: 6\' 3"  (190.5 cm) Weight: 110 lb 3.7 oz (50 kg) IBW/kg (Calculated) : 84.5 Heparin Dosing Weight: 60.8 kg  Vital Signs: Temp: 96.8 F (36 C) (11/30 1400) BP: 100/52 (11/30 1400) Pulse Rate: 79 (11/30 1400)  Labs: Recent Labs    12/08/18 1201 12/08/18 1224  12/09/18 0014  12/09/18 0610  12/09/18 1901 12/09/18 2115 12/09/18 2344 12/10/18 0329 12/10/18 0556 12/10/18 0930 12/10/18 1440 12/10/18 1445  HGB 12.3* 14.6  --   --   --   --   --   --  11.2*  --  8.2*  --   --   --   --   HCT 42.8 43.0  --   --   --   --   --   --  33.0*  --  24.0*  --   --   --   --   PLT 573*  --   --   --   --   --   --   --   --   --   --   --   --   --   --   APTT 27  --   --  173*  --   --   --   --   --   --   --   --   --   --   --   LABPROT 18.3*  --   --   --   --   --   --   --   --   --   --   --   --   --   --   INR 1.5*  --   --   --   --   --   --   --   --   --   --   --   --   --   --   HEPARINUNFRC <0.10*  --   --  1.44*   < >  --    < >  --   --  0.35  --  0.22*  --  0.28*  --   CREATININE 12.91*  --    < >  --    < > 9.88*   < > 10.30*  --   --   --  9.87*  --   --  8.98*  TROPONINIHS 30*  --    < >  --   --  76*  --   --   --   --   --  54* 54*  --   --    < > = values in this interval not displayed.    Estimated Creatinine Clearance: 6.4 mL/min (A) (by C-G formula based on SCr of 8.98 mg/dL (H)).  Assessment: Patient is a 28 yom that was recently admitted at the end of last month and was diagnosed with a DVT. The patient was discharged on Xarelto. At this time the patient is unable to take PO medications and pharmacy has been consulted to dose heparin at this time.  Hep lvl slightly low at 0.28  Goal of Therapy:  Heparin level 0.3-0.7 units/ml Monitor platelets by anticoagulation protocol: Yes   Plan:  - Increase Heparin to 1050 units/hr  -  recheck at [place], PharmD, BCPS, [place] Pharmacist [phone]  Please check AMION  for all [place] numbers  12/10/2018 3:47 PM

## 2018-12-10 NOTE — Progress Notes (Signed)
NAME:  Timothy Madden, MRN:  [id number], DOB:  1960-03-30, LOS: 2 ADMISSION DATE:  12/08/2018, CONSULTATION DATE:  12/08/18 REFERRING MD:  ED, CHIEF COMPLAINT:  AMS   Brief History    58 year old man with a history of numerous psychiatric issues, diabetes poorly controlled supposed to be on home insulin presenting with obtundation from home found to be in severe DKA/HHS   Past Medical History  -Diabetes, latest A1c 15% -Psychiatric conditions requiring antipsychotics and has been evaluated by psychiatry in the past for his capacity to care for himself and been cleared -Severe protein calorie malnutrition -Likely diabetic gastroparesis -Recurrent 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 -Recent DVT supposed to be on [facility] PTA  Significant Hospital Events   12/08/18 Admitted  Consults:  PCCM  Procedures:  NA  Significant Diagnostic Tests:  EKG shows sinus tachycardia, he does have peaked T waves this was pretreatment of his hyperkalemia  Post treatment EKG benign  Micro Data:  Blood cultures 11/28 >> serratia Urine culture 11/28 serratia   Antimicrobials:  Vancomycin, cefepime, azithromycin 12/08/2018 Ceftriaxone 11/29 >>  Interim history/subjective:   Hypertension overnight Complained of chest pain -EKG did not show any changes troponins negative Complains of thirst this morning Afebrile Improving urine output   Objective   Blood pressure (!) 147/72, pulse 82, temperature 97.9 F (36.6 C), resp. rate (!) 25, height 6\' 3"  (1.905 m), weight 50 kg, SpO2 100 %.        Intake/Output Summary (Last 24 hours) at 12/10/2018 0934 Last data filed at 12/10/2018 0754 Gross per 24 hour  Intake 5924.98 ml  Output 2840 ml  Net 3084.98 ml   Filed Weights   12/08/18 1200 12/08/18 2050 12/09/18 0300  Weight: 86.9 kg 60.8 kg 50 kg    Examination: General: Chronically ill-appearing middle-aged man laying in bed HENT: Mucous membranes dry, trachea  midline, temporal wasting + Lungs: Decreased breath sounds bilateral, no rhonchi Cardiovascular: RRR, soft ESM 2/6 at base Abdomen: Soft, hypoactive bowel sounds Extremities: Muscle wasting without edema, he has missing left finger, severe changes of diabetic neuropathy and joint issues Neuro: Awake and interactive though sluggish responses are appropriate, nonfocal grossly  Labs show troponin 54, potassium 3.0, decreasing creatinine to 9.8 from 12.9  Resolved Hospital Problem list   NA  Assessment & Plan:  # Severe sepsis from complicated UTI with bacteremia, Serratia in blood, has history of recurrent issues with this -Antibiotics simplified to ceftriaxone to which she has clinically responded, follow sensitivity results  # HHS/DKA overlap, mostly former,on insulin drip, improving  -Transition off insulin drip after giving 10 units of Levemir as indicated by Endo tool - Diabetes educator consult  # Acute metabolic encephalopathy, improved  # Numerous psych issues limiting compliance -PT eval  # AKI, nonoliguric, hopeful we can avoid HD # Lactic and ketoacidosis, improved  -Continue bicarbonate drip at 100/h -Hopeful to avoid dialysis here  # Hx recent DVT, doubt home AC compliance -Continue IV heparin, once renal function normalizes can transition back to Xarelto but question compliance may be Coumadin would be a better choice  #New onset hypertension-use labetalol as needed, may be related to ICU stay  Transfer to telemetry and to triad    Best practice:  Diet: Renal diet Pain/Anxiety/Delirium protocol (if indicated): Avoid all VAP protocol (if indicated): N/A, aspiration precautions DVT prophylaxis: heparin gtt GI prophylaxis: PPI Glucose control: insulin gtt Mobility: BR Code Status: full Disposition: Telemetry   The patient is critically  ill with multiple organ systems failure and requires high complexity decision making for assessment and support, frequent  evaluation and titration of therapies, application of advanced monitoring technologies and extensive interpretation of multiple databases. Critical Care Time devoted to patient care services described in this note independent of APP/resident  time is 32 minutes.   Kara Mead MD. [facility] Flood. [facility]  If no response to pager , please call [phone]   12/10/2018

## 2018-12-10 NOTE — Evaluation (Signed)
Physical Therapy Evaluation Patient Details Name: Timothy Madden MRN: [id number] DOB: 02-20-1960 Today's Date: 12/10/2018   History of Present Illness  This is a 58 year old man with a history of numerous psychiatric issues, diabetes poorly controlled supposed to be on home insulin presenting with obtundation from home found to be in severe DKA/HOCM.  PMH DKA, DM, L second finger amputation.  Clinical Impression  Patient presents with decreased mobility due to generalized weakness, decreased cognition with difficulty following commands, decreased balance and limited activity tolerance.  Feel he will benefit from skilled PT in the acute setting and from follow up SNF level rehab at d/c.      Follow Up Recommendations SNF    Equipment Recommendations  Rolling walker with 5" wheels    Recommendations for Other Services       Precautions / Restrictions Precautions Precautions: Fall      Mobility  Bed Mobility Overal bed mobility: Needs Assistance Bed Mobility: Supine to Sit     Supine to sit: HOB elevated;Max assist     General bed mobility comments: assist to bring legs off bed and to lift trunk  Transfers Overall transfer level: Needs assistance   Transfers: Stand Pivot Transfers   Stand pivot transfers: Mod assist;Max assist       General transfer comment: lifting assist from EOB and pt reaching for armrest on chair in squat position, able to step around with time with mod support to get to chair and very slow to sit  Ambulation/Gait                Stairs            Wheelchair Mobility    Modified Rankin (Stroke Patients Only)       Balance Overall balance assessment: Needs assistance Sitting-balance support: Feet supported Sitting balance-Leahy Scale: Fair Sitting balance - Comments: once positioned upright with feet on floor balanced without support, hands in his lap   Standing balance support: Bilateral upper extremity  supported Standing balance-Leahy Scale: Poor Standing balance comment: did not stand erect for pivot to chair                             Pertinent Vitals/Pain Pain Assessment: Faces Faces Pain Scale: Hurts little more Pain Location: legs Pain Descriptors / Indicators: Grimacing;Guarding Pain Intervention(s): Monitored during session;Limited activity within patient's tolerance;Repositioned    Home Living Family/patient expects to be discharged to:: Private residence Living Arrangements: Alone   Type of Home: Apartment Home Access: Level entry     Home Layout: One level Home Equipment: Cane - single point      Prior Function Level of Independence: Independent         Comments: reports lives alone, prior notes from admission 1 month and  5 months ago indicate he lives with his mother     Hand Dominance        Extremity/Trunk Assessment   Upper Extremity Assessment Upper Extremity Assessment: RUE deficits/detail;LUE deficits/detail RUE Deficits / Details: AAROM limited to about 120 shoulder flexion, elbow AROM WFL,grips weakly to command, but can hold a cup LUE Deficits / Details: AAROM limited to about 120 shoulder flexion, elbow AROM WFL,grips weakly to command, but can hold a cup; index finger ampuation with dark looking residual limb, indicated it is painful    Lower Extremity Assessment Lower Extremity Assessment: RLE deficits/detail;LLE deficits/detail RLE Deficits / Details: not lifting to command more than weakly  partially against gravity; weak with weight bearing transfer to chair, limited due to limited cognition RLE Coordination: decreased gross motor;decreased fine motor LLE Deficits / Details: not lifting to command more than weakly partially against gravity; weak with weight bearing transfer to chair, limited due to limited cognition LLE Coordination: decreased gross motor;decreased fine motor       Communication   Communication:  Expressive difficulties(mumbling, consistent with yes/no)  Cognition Arousal/Alertness: Awake/alert Behavior During Therapy: Flat affect Overall Cognitive Status: Impaired/Different from baseline Area of Impairment: Attention;Following commands;Safety/judgement                   Current Attention Level: Focused   Following Commands: Follows one step commands inconsistently;Follows one step commands with increased time Safety/Judgement: Decreased awareness of deficits     General Comments: limited verbal responses, mumbling, but accurate with yes/no, follows some commands with increased time, gestures      General Comments General comments (skin integrity, edema, etc.): initially with mitt restraints on, RN reported okay to leave them off; initially on 0.5L O2 removed prior to mobility and VSS throughout    Exercises     Assessment/Plan    PT Assessment Patient needs continued PT services  PT Problem List Decreased strength;Decreased activity tolerance;Decreased mobility;Decreased cognition;Decreased balance;Decreased knowledge of use of DME;Decreased safety awareness       PT Treatment Interventions DME instruction;Therapeutic activities;Balance training;Therapeutic exercise;Functional mobility training;Gait training;Patient/family education;Cognitive remediation    PT Goals (Current goals can be found in the Care Plan section)  Acute Rehab PT Goals Patient Stated Goal: agreeable to rehab PT Goal Formulation: With patient Time For Goal Achievement: 12/24/18 Potential to Achieve Goals: Fair    Frequency Min 2X/week   Barriers to discharge Decreased caregiver support      Co-evaluation               AM-PAC PT "6 Clicks" Mobility  Outcome Measure Help needed turning from your back to your side while in a flat bed without using bedrails?: A Lot Help needed moving from lying on your back to sitting on the side of a flat bed without using bedrails?: Total Help  needed moving to and from a bed to a chair (including a wheelchair)?: A Lot Help needed standing up from a chair using your arms (e.g., wheelchair or bedside chair)?: Total Help needed to walk in hospital room?: Total Help needed climbing 3-5 steps with a railing? : Total 6 Click Score: 8    End of Session   Activity Tolerance: Patient limited by fatigue;Patient limited by pain Patient left: in chair;with call bell/phone within reach Nurse Communication: Mobility status PT Visit Diagnosis: Other abnormalities of gait and mobility (R26.89);Muscle weakness (generalized) (M62.81);Other symptoms and signs involving the nervous system (R29.898)    Time: 1046-1110 PT Time Calculation (min) (ACUTE ONLY): 24 min   Charges:   PT Evaluation $PT Eval Moderate Complexity: 1 Mod PT Treatments $Therapeutic Activity: 8-22 mins        Timothy Madden, [place] [facility] [phone] 12/10/2018   Timothy Madden 12/10/2018, 11:34 AM

## 2018-12-10 NOTE — Progress Notes (Signed)
ANTICOAGULATION CONSULT NOTE - Follow Up Consult  Pharmacy Consult for heparin Indication: DVT  Labs: Recent Labs    12/08/18 1201 12/08/18 1224 12/08/18 1351  12/09/18 0014 12/09/18 0214 12/09/18 0610 12/09/18 1255 12/09/18 1635 12/09/18 1901 12/09/18 2115 12/09/18 2344  HGB 12.3* 14.6  --   --   --   --   --   --   --   --  11.2*  --   HCT 42.8 43.0  --   --   --   --   --   --   --   --  33.0*  --   PLT 573*  --   --   --   --   --   --   --   --   --   --   --   APTT 27  --   --   --  173*  --   --   --   --   --   --   --   LABPROT 18.3*  --   --   --   --   --   --   --   --   --   --   --   INR 1.5*  --   --   --   --   --   --   --   --   --   --   --   HEPARINUNFRC <0.10*  --   --   --  1.44* 1.56*  --  0.77*  --   --   --  0.35  CREATININE 12.91*  --   --    < >  --  10.82* 9.88*  --  10.98* 10.30*  --   --   TROPONINIHS 30*  --  31*  --   --   --  76*  --   --   --   --   --    < > = values in this interval not displayed.    Assessment/Plan:  58yo male therapeutic on heparin after rate change. Will continue gtt at current rate and confirm stable with am labs.   Wynona Neat, PharmD, BCPS  12/10/2018,1:00 AM

## 2018-12-10 NOTE — Progress Notes (Signed)
[facility] Progress Note Patient Name: BURR SOFFER DOB: 29-Oct-1960 MRN: [id number]   Date of Service  12/10/2018  HPI/Events of Note  Multiple issues: 1. Hypertension - BP = 200/93 and patient c/o chest pain.   eICU Interventions  Will order: 1. 12 Lead EKG now. 2. NTG 0.4 mg SL now. 3. Nitroglycerin ointment 2% 1 inch to chest wall now and QQ 6 hours. Hold for SBP < 110.  4. Cycle Troponin. Although hard to interpret in setting of AKI.     Intervention Category Major Interventions: Other:;Hypertension - evaluation and management  Eloise Mula Cornelia Copa 12/10/2018, 4:24 AM

## 2018-12-10 NOTE — Progress Notes (Signed)
Patient ID: IZYAN EZZELL, male   DOB: 1960/10/25, 58 y.o.   MRN: [id number] S: Pt developed CP and HTN last pm improved with ntg. O:BP (!) 100/52   Pulse 79   Temp (!) 96.8 F (36 C)   Resp 16   Ht 6\' 3"  (1.905 m)   Wt 50 kg   SpO2 100%   BMI 13.78 kg/m   Intake/Output Summary (Last 24 hours) at 12/10/2018 1557 Last data filed at 12/10/2018 1406 Gross per 24 hour  Intake 5777.25 ml  Output 3700 ml  Net 2077.25 ml   Intake/Output: I/O last 3 completed shifts: In: 8185.3 [I.V.:6568.2; Other:20; IV Piggyback:1597.1] Out: 9242 [ASTMH:9622]  Intake/Output this shift:  Total I/O In: 1148.3 [I.V.:998.3; IV Piggyback:150] Out: 2979 [Urine:1220] Weight change:  Gen: chronically ill-appearing AAM in NAD, slowed mentation CVS: no rub  Resp: occ rhonchi Abd: +BS, soft, NT/ND Ext:no edema  Recent Labs  Lab 12/08/18 1201  12/08/18 2144 12/09/18 0214 12/09/18 0610 12/09/18 1635 12/09/18 1901 12/09/18 2115 12/10/18 0329 12/10/18 0556 12/10/18 1445  NA 120*   < > 146* 145 142 148* 146* 144 144 143 144  K 7.2*   < > 4.4 4.9 4.2 4.8 3.9 3.6 3.0* 3.0* 2.9*  CL 85*   < > 116* 115* 113* 116* 119*  --   --  112* 109  CO2 10*   < > 12* 9* 13* 8* 10*  --   --  16* 19*  GLUCOSE 1,681*   < > 245* 200* 493* 154* 170*  --   --  196* 158*  BUN 182*   < > 164* 166* 153* 160* 156*  --   --  141* 128*  CREATININE 12.91*   < > 10.75* 10.82* 9.88* 10.98* 10.30*  --   --  9.87* 8.98*  ALBUMIN 2.5*  --   --   --   --   --   --   --   --  1.4*  --   CALCIUM 9.1   < > 8.5* 8.3* 7.5* 8.0* 7.4*  --   --  7.0* 7.0*  PHOS  --   --   --   --   --   --   --   --   --  4.1  --   AST 23  --   --   --   --   --   --   --   --   --   --   ALT 14  --   --   --   --   --   --   --   --   --   --    < > = values in this interval not displayed.   Liver Function Tests: Recent Labs  Lab 12/08/18 1201 12/10/18 0556  AST 23  --   ALT 14  --   ALKPHOS 101  --   BILITOT 0.9  --   PROT 9.4*  --    ALBUMIN 2.5* 1.4*   No results for input(s): LIPASE, AMYLASE in the last 168 hours. No results for input(s): AMMONIA in the last 168 hours. CBC: Recent Labs  Lab 12/08/18 1201 12/08/18 1224 12/09/18 2115 12/10/18 0329  WBC 15.7*  --   --   --   NEUTROABS 14.2*  --   --   --   HGB 12.3* 14.6 11.2* 8.2*  HCT 42.8 43.0 33.0* 24.0*  MCV 89.9  --   --   --  PLT 573*  --   --   --    Cardiac Enzymes: No results for input(s): CKTOTAL, CKMB, CKMBINDEX, TROPONINI in the last 168 hours. CBG: Recent Labs  Lab 12/10/18 0945 12/10/18 1046 12/10/18 1128 12/10/18 1318 12/10/18 1353  GLUCAP 150* 144* 121* 133* 135*    Iron Studies: No results for input(s): IRON, TIBC, TRANSFERRIN, FERRITIN in the last 72 hours. Studies/Results: US Renal  Result Date: 12/10/2018 CLINICAL DATA:  Acute kidney injury EXAM: RENAL / URINARY TRACT ULTRASOUND COMPLETE COMPARISON:  July 19, 2018 FINDINGS: Right Kidney: Renal measurements: 12.8 x 5.8 x 6 cm = volume: 233 mL . Echogenicity within normal limits. No mass visualized. Mild hydronephrosis. Left Kidney: Renal measurements: 13.5 x 5.9 x 6 cm. = volume: 253 mL. Echogenicity within normal limits. No mass visualized. Moderate hydronephrosis. Bladder: Bladder wall thickening seen again. Foley catheter is present. Echogenic foci along nondependent bladder wall likely reflect air. Other: None. IMPRESSION: Persistent left greater than right hydronephrosis and bladder wall thickening. Electronically Signed   By: Macy Mis M.D.   On: 12/10/2018 09:54   . chlorhexidine  15 mL Mouth Rinse BID  . Chlorhexidine Gluconate Cloth  6 each Topical Q0600  . Chlorhexidine Gluconate Cloth  6 each Topical Q0600  . insulin aspart  0-15 Units Subcutaneous Q4H  . insulin detemir  10 Units Subcutaneous Q24H  . mouth rinse  15 mL Mouth Rinse q12n4p  . mupirocin ointment  1 application Nasal BID  . nitroGLYCERIN  1 inch Topical Q6H  . pantoprazole  40 mg Oral QHS     BMET    Component Value Date/Time   NA 144 12/10/2018 1445   K 2.9 (L) 12/10/2018 1445   CL 109 12/10/2018 1445   CO2 19 (L) 12/10/2018 1445   GLUCOSE 158 (H) 12/10/2018 1445   BUN 128 (H) 12/10/2018 1445   CREATININE 8.98 (H) 12/10/2018 1445   CREATININE 1.95 (H) 11/11/2013 1212   CALCIUM 7.0 (L) 12/10/2018 1445   GFRNONAA 6 (L) 12/10/2018 1445   GFRNONAA 38 (L) 11/11/2013 1212   GFRAA 7 (L) 12/10/2018 1445   GFRAA 44 (L) 11/11/2013 1212   CBC    Component Value Date/Time   WBC 15.7 (H) 12/08/2018 1201   RBC 4.76 12/08/2018 1201   HGB 8.2 (L) 12/10/2018 0329   HCT 24.0 (L) 12/10/2018 0329   PLT 573 (H) 12/08/2018 1201   MCV 89.9 12/08/2018 1201   MCH 25.8 (L) 12/08/2018 1201   MCHC 28.7 (L) 12/08/2018 1201   RDW 17.9 (H) 12/08/2018 1201   LYMPHSABS 0.5 (L) 12/08/2018 1201   MONOABS 0.7 12/08/2018 1201   EOSABS 0.0 12/08/2018 1201   BASOSABS 0.1 12/08/2018 1201     Assessment/Plan:  1. AKI/CKD stage 4- in setting of DKA and persistent obstructive uropathy.  Improving with IVF's and insulin, however BUN/Cr remain elevated.  Renal US reveals persistent left > right hydronephrosis despite foley catheter.   1. Recommend Urology consult for cysto and jj stent placement vs IR consult for percutaneous nephrostomy tubes per primary svc.  PCR may be better in light of recent urosepsis. 2. DKA/HONK- on insulin per primary svc 3. Sepsis from UTI/serratia bacteremia- on ceftriaxone per PCCM 4. AMS- improving with improved glucose control 5. Metabolic acidosis due to #1- on bicarb drip 6. DVT- on IV heparin 7. HTN- new onset possibly related to ICU on prn labetalol. 8. Hypokalemia- replete and follow 9. Anemia of CKD- check iron stores and may need  ESA. 10. Severe protein malnutrition- supplements per primary svc. Recommend nepro given advanced CKD at baseline 11. Psychiatric disorder- h/o noncompliance and poor self care 12. Disposition- will likely require placement when  stable.  Donetta Potts, MD [employer] [phone]

## 2018-12-10 NOTE — Progress Notes (Signed)
ANTICOAGULATION CONSULT NOTE   Pharmacy Consult for Heparin  Indication: DVT  No Known Allergies  Patient Measurements: Height: 6\' 3"  (190.5 cm) Weight: 110 lb 3.7 oz (50 kg) IBW/kg (Calculated) : 84.5 Heparin Dosing Weight: 60.8 kg  Vital Signs: Temp: 98.6 F (37 C) (11/30 0600) BP: 108/66 (11/30 0600) Pulse Rate: 91 (11/30 0600)  Labs: Recent Labs    12/08/18 1201 12/08/18 1224 12/08/18 1351  12/09/18 0014  12/09/18 0610 12/09/18 1255 12/09/18 1635 12/09/18 1901 12/09/18 2115 12/09/18 2344 12/10/18 0329 12/10/18 0556  HGB 12.3* 14.6  --   --   --   --   --   --   --   --  11.2*  --  8.2*  --   HCT 42.8 43.0  --   --   --   --   --   --   --   --  33.0*  --  24.0*  --   PLT 573*  --   --   --   --   --   --   --   --   --   --   --   --   --   APTT 27  --   --   --  173*  --   --   --   --   --   --   --   --   --   LABPROT 18.3*  --   --   --   --   --   --   --   --   --   --   --   --   --   INR 1.5*  --   --   --   --   --   --   --   --   --   --   --   --   --   HEPARINUNFRC <0.10*  --   --   --  1.44*   < >  --  0.77*  --   --   --  0.35  --  0.22*  CREATININE 12.91*  --   --    < >  --    < > 9.88*  --  10.98* 10.30*  --   --   --  9.87*  TROPONINIHS 30*  --  31*  --   --   --  76*  --   --   --   --   --   --  54*   < > = values in this interval not displayed.    Estimated Creatinine Clearance: 5.8 mL/min (A) (by C-G formula based on SCr of 9.87 mg/dL (H)).  Assessment: Patient is a 89 yom that was recently admitted at the end of last month and was diagnosed with a DVT. The patient was discharged on Xarelto. At this time the patient is unable to take PO medications and pharmacy has been consulted to dose heparin at this time.  Heparin level this morning is subtherapeutic at 0.22 after a slight rate decrease yesterday. Per nursing no issues with infusion even though charted as off around 0600 this am. H&H is still decreasing (could be dilutional as on  continuous fluids and no bleeding noted per nursing), now at 8.2/24, last plts were elevated at 573.   He was initially started on heparin 900 units/hour with a 6000 unit bolus and then had supratherapeutic heparin levels that have since decreased  after a slight infusion rate decrease of 50 units/hour. Given potential sensitivity to boluses and decreasing H&H, will conservatively bolus and increase heparin infusion rate.   Goal of Therapy:  Heparin level 0.3-0.7 units/ml Monitor platelets by anticoagulation protocol: Yes   Plan:  - bolus heparin with 750 units  - Increase Heparin to 950 units/hr  - monitor daily CBC and heparin level  - check a 6 hour heparin level   Thank you,   Eddie Candle, PharmD PGY-1 Pharmacy Resident   Please check amion for clinical pharmacist contact number

## 2018-12-10 NOTE — Progress Notes (Signed)
[facility] Progress Note Patient Name: Timothy Madden DOB: [birth year] MRN: [id number]   Date of Service  12/10/2018  HPI/Events of Note  K+ = 2.8 and Creatinine = 9.56.   eICU Interventions  Will replace K+ cautiously.     Intervention Category Major Interventions: Electrolyte abnormality - evaluation and management  [name],[name] 12/10/2018, 11:14 PM

## 2018-12-11 DIAGNOSIS — N390 Urinary tract infection, site not specified: Secondary | ICD-10-CM

## 2018-12-11 DIAGNOSIS — R7881 Bacteremia: Secondary | ICD-10-CM

## 2018-12-11 DIAGNOSIS — I82409 Acute embolism and thrombosis of unspecified deep veins of unspecified lower extremity: Secondary | ICD-10-CM | POA: Diagnosis present

## 2018-12-11 DIAGNOSIS — E872 Acidosis, unspecified: Secondary | ICD-10-CM | POA: Diagnosis present

## 2018-12-11 DIAGNOSIS — D631 Anemia in chronic kidney disease: Secondary | ICD-10-CM | POA: Diagnosis present

## 2018-12-11 DIAGNOSIS — I10 Essential (primary) hypertension: Secondary | ICD-10-CM

## 2018-12-11 DIAGNOSIS — N1330 Unspecified hydronephrosis: Secondary | ICD-10-CM | POA: Diagnosis present

## 2018-12-11 DIAGNOSIS — N189 Chronic kidney disease, unspecified: Secondary | ICD-10-CM | POA: Diagnosis present

## 2018-12-11 DIAGNOSIS — N184 Chronic kidney disease, stage 4 (severe): Secondary | ICD-10-CM

## 2018-12-11 DIAGNOSIS — E43 Unspecified severe protein-calorie malnutrition: Secondary | ICD-10-CM

## 2018-12-11 LAB — URINE CULTURE: Culture: >=100000 — AB

## 2018-12-11 LAB — MAGNESIUM: Magnesium: 1.9 mg/dL (ref 1.7–2.4)

## 2018-12-11 LAB — BASIC METABOLIC PANEL
Anion gap: 18 — ABNORMAL HIGH (ref 5–15)
BUN: 135 mg/dL — ABNORMAL HIGH (ref 6–20)
CO2: 21 mmol/L — ABNORMAL LOW (ref 22–32)
Calcium: 7.1 mg/dL — ABNORMAL LOW (ref 8.9–10.3)
Chloride: 104 mmol/L (ref 98–111)
Creatinine, Ser: 9.2 mg/dL — ABNORMAL HIGH (ref 0.61–1.24)
GFR calc Af Amer: 7 mL/min — ABNORMAL LOW (ref >60)
GFR calc non Af Amer: 6 mL/min — ABNORMAL LOW (ref >60)
Glucose, Bld: 111 mg/dL — ABNORMAL HIGH (ref 70–99)
Potassium: 2.8 mmol/L — ABNORMAL LOW (ref 3.5–5.1)
Sodium: 143 mmol/L (ref 135–145)

## 2018-12-11 LAB — HEPARIN LEVEL (UNFRACTIONATED)
Heparin Unfractionated: 0.31 IU/mL (ref 0.30–0.70)
Heparin Unfractionated: 0.32 IU/mL (ref 0.30–0.70)

## 2018-12-11 LAB — CULTURE, BLOOD (ROUTINE X 2): Special Requests: ADEQUATE

## 2018-12-11 LAB — RENAL FUNCTION PANEL
Albumin: 1.5 g/dL — ABNORMAL LOW (ref 3.5–5.0)
Anion gap: 17 — ABNORMAL HIGH (ref 5–15)
BUN: 128 mg/dL — ABNORMAL HIGH (ref 6–20)
CO2: 26 mmol/L (ref 22–32)
Calcium: 6.9 mg/dL — ABNORMAL LOW (ref 8.9–10.3)
Chloride: 99 mmol/L (ref 98–111)
Creatinine, Ser: 8.97 mg/dL — ABNORMAL HIGH (ref 0.61–1.24)
GFR calc Af Amer: 7 mL/min — ABNORMAL LOW (ref >60)
GFR calc non Af Amer: 6 mL/min — ABNORMAL LOW (ref >60)
Glucose, Bld: 160 mg/dL — ABNORMAL HIGH (ref 70–99)
Phosphorus: 3.4 mg/dL (ref 2.5–4.6)
Potassium: 2.9 mmol/L — ABNORMAL LOW (ref 3.5–5.1)
Sodium: 142 mmol/L (ref 135–145)

## 2018-12-11 LAB — CBC
HCT: 23.6 % — ABNORMAL LOW (ref 39.0–52.0)
Hemoglobin: 8.4 g/dL — ABNORMAL LOW (ref 13.0–17.0)
MCH: 26 pg (ref 26.0–34.0)
MCHC: 35.6 g/dL (ref 30.0–36.0)
MCV: 73.1 fL — ABNORMAL LOW (ref 80.0–100.0)
Platelets: 249 10*3/uL (ref 150–400)
RBC: 3.23 MIL/uL — ABNORMAL LOW (ref 4.22–5.81)
RDW: 14.9 % (ref 11.5–15.5)
WBC: 20.2 10*3/uL — ABNORMAL HIGH (ref 4.0–10.5)
nRBC: 0.2 % (ref 0.0–0.2)

## 2018-12-11 LAB — GLUCOSE, CAPILLARY
Glucose-Capillary: 99 mg/dL (ref 70–99)
Glucose-Capillary: 114 mg/dL — ABNORMAL HIGH (ref 70–99)
Glucose-Capillary: 164 mg/dL — ABNORMAL HIGH (ref 70–99)
Glucose-Capillary: 134 mg/dL — ABNORMAL HIGH (ref 70–99)
Glucose-Capillary: 380 mg/dL — ABNORMAL HIGH (ref 70–99)

## 2018-12-11 LAB — PHOSPHORUS: Phosphorus: 4.2 mg/dL (ref 2.5–4.6)

## 2018-12-11 MED ORDER — BOOST / RESOURCE BREEZE PO LIQD CUSTOM
1.0000 | Freq: Three times a day (TID) | ORAL | Status: DC
  Administered 2018-12-12 (×2): 1 via ORAL

## 2018-12-11 MED ORDER — POTASSIUM CHLORIDE 10 MEQ/100ML IV SOLN
10.0000 meq | INTRAVENOUS | Status: AC
  Administered 2018-12-11 (×4): 10 meq via INTRAVENOUS
  Filled 2018-12-11 (×4): qty 100

## 2018-12-11 NOTE — Progress Notes (Signed)
Patient ID: Timothy Madden, male   DOB: March 22, 1960, 58 y.o.   MRN: [id number] S: developed N/V this am O:BP 104/67   Pulse 89   Temp 99 F (37.2 C)   Resp 18   Ht 6\' 3"  (1.905 m)   Wt 50 kg   SpO2 100%   BMI 13.78 kg/m   Intake/Output Summary (Last 24 hours) at 12/11/2018 1218 Last data filed at 12/11/2018 1025 Gross per 24 hour  Intake 2047.46 ml  Output 4605 ml  Net -2557.54 ml   Intake/Output: I/O last 3 completed shifts: In: 5559.9 [I.V.:5278.6; IV Piggyback:281.4] Out: 0300 [Urine:7135]  Intake/Output this shift:  Total I/O In: -  Out: 805 [Urine:805] Weight change:  Gen:NAD, somnolent but arousable CVS: no rub  Resp:occ rhonchi Abd: +BS< soft, NT/ND Ext: no edema  Recent Labs  Lab 12/08/18 1201  12/09/18 1635 12/09/18 1901 12/09/18 2115 12/10/18 0329 12/10/18 0556 12/10/18 1445 12/10/18 2118 12/10/18 2336 12/11/18 0915  NA 120*   < > 148* 146* 144 144 143 144 142 143 142  K 7.2*   < > 4.8 3.9 3.6 3.0* 3.0* 2.9* 2.8* 2.8* 2.9*  CL 85*   < > 116* 119*  --   --  112* 109 105 104 99  CO2 10*   < > 8* 10*  --   --  16* 19* 22 21* 26  GLUCOSE 1,681*   < > 154* 170*  --   --  196* 158* 120* 111* 160*  BUN 182*   < > 160* 156*  --   --  141* 128* 135* 135* 128*  CREATININE 12.91*   < > 10.98* 10.30*  --   --  9.87* 8.98* 9.56* 9.20* 8.97*  ALBUMIN 2.5*  --   --   --   --   --  1.4*  --   --   --  1.5*  CALCIUM 9.1   < > 8.0* 7.4*  --   --  7.0* 7.0* 7.1* 7.1* 6.9*  PHOS  --   --   --   --   --   --  4.1  --   --  4.2 3.4  AST 23  --   --   --   --   --   --   --   --   --   --   ALT 14  --   --   --   --   --   --   --   --   --   --    < > = values in this interval not displayed.   Liver Function Tests: Recent Labs  Lab 12/08/18 1201 12/10/18 0556 12/11/18 0915  AST 23  --   --   ALT 14  --   --   ALKPHOS 101  --   --   BILITOT 0.9  --   --   PROT 9.4*  --   --   ALBUMIN 2.5* 1.4* 1.5*   No results for input(s): LIPASE, AMYLASE in the last 168  hours. No results for input(s): AMMONIA in the last 168 hours. CBC: Recent Labs  Lab 12/08/18 1201  12/09/18 2115 12/10/18 0329 12/10/18 2336  WBC 15.7*  --   --   --  20.2*  NEUTROABS 14.2*  --   --   --   --   HGB 12.3*   < > 11.2* 8.2* 8.4*  HCT 42.8   < >  33.0* 24.0* 23.6*  MCV 89.9  --   --   --  73.1*  PLT 573*  --   --   --  249   < > = values in this interval not displayed.   Cardiac Enzymes: No results for input(s): CKTOTAL, CKMB, CKMBINDEX, TROPONINI in the last 168 hours. CBG: Recent Labs  Lab 12/10/18 1923 12/10/18 2327 12/11/18 0309 12/11/18 0723 12/11/18 1209  GLUCAP 116* 107* 99 114* 164*    Iron Studies: No results for input(s): IRON, TIBC, TRANSFERRIN, FERRITIN in the last 72 hours. Studies/Results: US Renal  Result Date: 12/10/2018 CLINICAL DATA:  Acute kidney injury EXAM: RENAL / URINARY TRACT ULTRASOUND COMPLETE COMPARISON:  July 19, 2018 FINDINGS: Right Kidney: Renal measurements: 12.8 x 5.8 x 6 cm = volume: 233 mL . Echogenicity within normal limits. No mass visualized. Mild hydronephrosis. Left Kidney: Renal measurements: 13.5 x 5.9 x 6 cm. = volume: 253 mL. Echogenicity within normal limits. No mass visualized. Moderate hydronephrosis. Bladder: Bladder wall thickening seen again. Foley catheter is present. Echogenic foci along nondependent bladder wall likely reflect air. Other: None. IMPRESSION: Persistent left greater than right hydronephrosis and bladder wall thickening. Electronically Signed   By: Macy Mis M.D.   On: 12/10/2018 09:54   . chlorhexidine  15 mL Mouth Rinse BID  . Chlorhexidine Gluconate Cloth  6 each Topical Q0600  . Chlorhexidine Gluconate Cloth  6 each Topical Q0600  . insulin aspart  0-15 Units Subcutaneous Q4H  . insulin detemir  10 Units Subcutaneous Q24H  . mouth rinse  15 mL Mouth Rinse q12n4p  . mupirocin ointment  1 application Nasal BID  . nitroGLYCERIN  1 inch Topical Q6H  . pantoprazole  40 mg Oral QHS     BMET    Component Value Date/Time   NA 142 12/11/2018 0915   K 2.9 (L) 12/11/2018 0915   CL 99 12/11/2018 0915   CO2 26 12/11/2018 0915   GLUCOSE 160 (H) 12/11/2018 0915   BUN 128 (H) 12/11/2018 0915   CREATININE 8.97 (H) 12/11/2018 0915   CREATININE 1.95 (H) 11/11/2013 1212   CALCIUM 6.9 (L) 12/11/2018 0915   GFRNONAA 6 (L) 12/11/2018 0915   GFRNONAA 38 (L) 11/11/2013 1212   GFRAA 7 (L) 12/11/2018 0915   GFRAA 44 (L) 11/11/2013 1212   CBC    Component Value Date/Time   WBC 20.2 (H) 12/10/2018 2336   RBC 3.23 (L) 12/10/2018 2336   HGB 8.4 (L) 12/10/2018 2336   HCT 23.6 (L) 12/10/2018 2336   PLT 249 12/10/2018 2336   MCV 73.1 (L) 12/10/2018 2336   MCH 26.0 12/10/2018 2336   MCHC 35.6 12/10/2018 2336   RDW 14.9 12/10/2018 2336   LYMPHSABS 0.5 (L) 12/08/2018 1201   MONOABS 0.7 12/08/2018 1201   EOSABS 0.0 12/08/2018 1201   BASOSABS 0.1 12/08/2018 1201    Assessment/Plan:  1. AKI/CKD stage 4- in setting of DKA and persistent obstructive uropathy.  Improving with IVF's and insulin, however BUN/Cr remain elevated.  Renal US reveals persistent left > right hydronephrosis despite foley catheter.   1. Will consult IR for percutaneous nephrostomy tubes  2. Will eventually need Urology consult for internalization and double j stents. 3. BUN/Cr slowly improving but hydro remains. 2. DKA/HONK- on insulin per primary svc 3. Sepsis from UTI/serratia bacteremia- on ceftriaxone per PCCM 4. AMS- improving with improved glucose control 5. Metabolic acidosis due to #1- on bicarb drip 6. DVT- on IV heparin 7. HTN- new  onset but presently resolved.  Possibly related to ICU on prn labetalol. 8. Hypokalemia- replete and follow 9. Anemia of CKD- check iron stores and may need ESA. 10. Severe protein malnutrition- supplements per primary svc. Recommend nepro given advanced CKD at baseline 11. Psychiatric disorder- h/o noncompliance and poor self care 12. Disposition- will likely  require placement when stable.  Donetta Potts, MD [employer] [phone]

## 2018-12-11 NOTE — Consult Note (Signed)
Urology Consult Note   Requesting Attending Physician:  Kathie Dike, MD Service Providing Consult: Urology   Reason for Consult: Bilateral hydronephrosis  HPI: Timothy Madden is seen in consultation for reasons noted above at the request of Kathie Dike, MD.  [name] with history of uncontrolled diabetes, CKD stage III-IV, noncompliance, urinary retention, bilateral hydronephrosis.   Patient was initially seen by the urology service in June 2020 when he was admitted for an episode of diabetic ketoacidosis with a creatinine of approximately 6.7, Foley catheter was placed and creatinine subsequently improved with large volume urine output.  At that time he did report several years of lower urinary tract symptoms and renal ultrasound did demonstrate bilateral hydronephrosis and a thickened bladder wall.  He was started on Flomax.  Follow-up ultrasound approximately 4 weeks later demonstrated improvement in bilateral hydronephrosis with persistent bladder wall thickening.  He was then readmitted in late October 2020, again for diabetic ketoacidosis. CT at that time demonstrated bilateral hydroureteronephrosis to the level of the UVJ bilaterally.  Prior urine cultures have grown multiple species, unclear if this represents contamination or polymicrobial infections.  Urine culture from June 2020 grew staph epidermidis.  Most recent A1c November 10, 2018 was 14.3, upon review of labs over the past 5 years, his A1c has never been below 14.  Recently admitted again when obtunded and with DKA and sepsis on 12/08/2018. Foley catheter was placed. Creatinine was ~12. No hyperkalemia or hyperphosphatemia. Since foley placement, has had large volume UOP. WBC on admission was 15.7 with temp 102 and WBC rose to 20. UClx grew Serratia, narrowed to CTX. BClx with Serratia. RUS 11/30 with mild bilateral hydronephrosis L>R and persistent bladder wall thickening.   In discussion today, patient  reports that he had been taking his tamsulosin as previously described.  He reported that since that time, he had been voiding reasonably well at home with no frequency, no urgency, no dysuria, no hematuria but did note incomplete emptying.  He denies any episodes of left or right flank pain.  Does not think that he has had a urinary tract infection to his knowledge since being discharged previously.  He is unclear what brought him into the hospital this time.  He is tolerating the catheter well.  Denies any flank pain.   Past Medical History: Past Medical History:  Diagnosis Date   Amputation of finger, left    Complication of anesthesia    Diabetes mellitus without complication ([place])    DKA (diabetic ketoacidoses) ([place]) 06/2018   PONV (postoperative nausea and vomiting)     Past Surgical History:  Past Surgical History:  Procedure Laterality Date   I&D EXTREMITY Left 10/26/2013   Procedure: IRRIGATION AND DEBRIDEMENT EXTREMITY,PARTIAL AMPUTATION LEFT INDEX FINGER;  Surgeon: Dayna Barker, MD;  Location: [facility];  Service: Plastics;  Laterality: Left;   I&D EXTREMITY Left 10/30/2013   Procedure: IRRIGATION AND DEBRIDEMENT AND REVISION AMPUTATION OF LEFT INDEX FINGER;  Surgeon: Dayna Barker, MD;  Location: [facility];  Service: Plastics;  Laterality: Left;    Medication: Current Facility-Administered Medications  Medication Dose Route Frequency Provider Last Rate Last Dose   acetaminophen (TYLENOL) tablet 650 mg  650 mg Oral Q6H PRN Rigoberto Noel, MD       cefTRIAXone (ROCEPHIN) 2 g in sodium chloride 0.9 % 100 mL IVPB  2 g Intravenous Q24H Kara Mead V, MD 200 mL/hr at 12/11/18 0803 2 g at 12/11/18 0803   chlorhexidine ([place])  0.12 % solution 15 mL  15 mL Mouth Rinse BID Kara Mead V, MD   15 mL at 12/11/18 1027   Chlorhexidine Gluconate Cloth 2 % PADS 6 each  6 each Topical Q0600 Rigoberto Noel, MD   6 each at 12/11/18 0556   Chlorhexidine Gluconate Cloth 2 % PADS 6 each   6 each Topical Q0600 Rigoberto Noel, MD   6 each at 12/11/18 0556   dextrose 50 % solution 0-50 mL  0-50 mL Intravenous PRN Rigoberto Noel, MD       feeding supplement (BOOST / RESOURCE BREEZE) liquid 1 Container  1 Container Oral TID BM Kathie Dike, MD       heparin ADULT infusion 100 units/mL (25000 units/236mL sodium chloride 0.45%)  1,150 Units/hr Intravenous Continuous Sloan Leiter B, RPH 11.5 mL/hr at 12/11/18 1310 1,150 Units/hr at 12/11/18 1310   insulin aspart (novoLOG) injection 0-15 Units  0-15 Units Subcutaneous Q4H Rigoberto Noel, MD   2 Units at 12/11/18 1649   insulin detemir (LEVEMIR) injection 10 Units  10 Units Subcutaneous Q24H Rigoberto Noel, MD   10 Units at 12/10/18 1206   insulin regular, human (MYXREDLIN) 100 units/ 100 mL infusion   Intravenous Continuous Rigoberto Noel, MD   Stopped at 12/10/18 1406   labetalol (NORMODYNE) injection 10-20 mg  10-20 mg Intravenous Q2H PRN Rigoberto Noel, MD       MEDLINE mouth rinse  15 mL Mouth Rinse q12n4p Rigoberto Noel, MD   15 mL at 12/11/18 1649   mupirocin ointment (BACTROBAN) 2 % 1 application  1 application Nasal BID Rigoberto Noel, MD   1 application at 83/15/17 0804   nitroGLYCERIN (NITROGLYN) 2 % ointment 1 inch  1 inch Topical Q6H Rigoberto Noel, MD   1 inch at 12/11/18 1227   nitroGLYCERIN (NITROSTAT) SL tablet 0.4 mg  0.4 mg Sublingual Q5 min PRN Rigoberto Noel, MD   0.4 mg at 12/10/18 0434   pantoprazole (PROTONIX) EC tablet 40 mg  40 mg Oral QHS Rigoberto Noel, MD   40 mg at 12/10/18 2208   sodium bicarbonate 150 mEq in sterile water 1,000 mL infusion   Intravenous Continuous Rigoberto Noel, MD 100 mL/hr at 12/11/18 0803      Allergies: No Known Allergies  Social History: Social History   Tobacco Use   Smoking status: Current Every Day Smoker    Packs/day: 1.00    Years: 20.00    Pack years: 20.00    Types: Cigarettes   Smokeless tobacco: Never Used  Substance Use Topics   Alcohol use: No     Alcohol/week: 0.0 standard drinks   Drug use: No    Family History Family History  Problem Relation Age of Onset   Hypertension Mother    Diabetes Father    Diabetes Sister    Diabetes Brother    Heart disease Brother     Review of Systems 10 systems were reviewed and are negative except as noted specifically in the HPI.  Objective   Vital signs in last 24 hours: BP 104/63    Pulse 80    Temp 98.2 F (36.8 C)    Resp 19    Ht 6\' 3"  (1.905 m)    Wt 50 kg    SpO2 100%    BMI 13.78 kg/m   Physical Exam General: NAD, A&O, resting, appropriate HEENT: [place]/AT, EOMI, MMM Pulmonary: Normal work of breathing Cardiovascular:  HDS, adequate peripheral perfusion Abdomen: Soft, NTTP, nondistended. GU: Circumcised phallus, 16 French Foley catheter draining clear yellow urine.  No CVA tenderness Extremities: warm and well perfused Neuro: Appropriate, no focal neurological deficits  Most Recent Labs: Lab Results  Component Value Date   WBC 20.2 (H) 12/10/2018   HGB 8.4 (L) 12/10/2018   HCT 23.6 (L) 12/10/2018   PLT 249 12/10/2018    Lab Results  Component Value Date   NA 142 12/11/2018   K 2.9 (L) 12/11/2018   CL 99 12/11/2018   CO2 26 12/11/2018   BUN 128 (H) 12/11/2018   CREATININE 8.97 (H) 12/11/2018   CALCIUM 6.9 (L) 12/11/2018   MG 1.9 12/10/2018   PHOS 3.4 12/11/2018    IMAGING: US Renal  Result Date: 12/10/2018 CLINICAL DATA:  Acute kidney injury EXAM: RENAL / URINARY TRACT ULTRASOUND COMPLETE COMPARISON:  July 19, 2018 FINDINGS: Right Kidney: Renal measurements: 12.8 x 5.8 x 6 cm = volume: 233 mL . Echogenicity within normal limits. No mass visualized. Mild hydronephrosis. Left Kidney: Renal measurements: 13.5 x 5.9 x 6 cm. = volume: 253 mL. Echogenicity within normal limits. No mass visualized. Moderate hydronephrosis. Bladder: Bladder wall thickening seen again. Foley catheter is present. Echogenic foci along nondependent bladder wall likely reflect air.  Other: None. IMPRESSION: Persistent left greater than right hydronephrosis and bladder wall thickening. Electronically Signed   By: Macy Mis M.D.   On: 12/10/2018 09:54    ------  Assessment:  58 y.o. male with brittle diabetes, prior episodes of DKA, recurrent urinary retention, voiding dysfunction, bilateral hydronephrosis.   Bilateral hydronephrosis: This has been present on imaging of his kidneys since June, it did improve in the past with Foley catheter placement and decompression of the lower urinary tract although unclear how much of a potentially chronic component still remain.  Unclear that his remaining level of bilateral hydronephrosis is due to upper tract source given prior CT scan demonstrating bilateral hydroureteronephrosis to the level of the bladder suggesting a lower tract etiology and/or some chronic component.  Unlikely that bilateral percutaneous nephrostomy tubes would significantly improve his overall renal function given he currently lower urinary tract drainage optimized with treatment of concurrent urinary tract infection. Although, if does not improve or has electrolyte issues, could place bilateral tubes, then perform capping trial vs internalization pending creatinine trend.   Acute urinary retention: This is likely multifactorial with diabetic cystopathy, uncontrolled diabetes and poor bladder function/voiding dysfunction comprising his largest issues. In the long term, he may require further studies or management with clean intermittent catheterization, chronic indwelling urethral catheter or even suprapubic tube if this continues to be an issue.  Serratia UTI: Likely secondary to urinary stasis in the setting of voiding dysfunction in the setting of diabetes.  He has had prior positive urine cultures as well.  Optimizing voiding function will be important in the long-term.  Agree with management of current urinary tract infection with culture directed therapy to  treat his complicated urinary tract infection for at least 14 days.   Hypertrophy of bladder wall: Likely secondary to chronic voiding dysfunction.   Left renal mass: Previously noted on CT scan.  No mass-effect.  Recommendations: - Continue Foley catheter to gravity drainage. Do not remove during this admission - Agree trend daily labs - Continue to monitor for post obstructive diuresis - Agree with treatment of Serratia UTI / bacteremia - Ensure treatment of constipation / bowel regimen  - Continue to monitor for improvement of renal  function with optimized lower tract drainage, repeat RUS on morning of 11/13/2018.  -While it would be unlikely for him to have an anatomic cause for bilateral ureteral obstruction, his urinary tract infection may have caused some inflammation in the bladder it may slow drainage from bilateral distal ureter/at the ureteral orifice.  Treatment of urinary tract infection with constant lower urinary tract drainage would provide optimal therapy.  -Favor trending creatinine and electrolytes for additional 1-2 days with repeat RUS prior to placement of bilateral percutaneous nephrostomy tubes.If requires PCNs, will need to continue monitoring, capping trial, etc in the setting of both his psychiatric comorbidities and social/transportation limitations.   -He will require long-term management of his dysfunctional voiding/recurrent urinary retention.  May need to consider the role of UDS, as well as clean intermittent catheterization versus Foley versus suprapubic tube.  Thank you for this consult.

## 2018-12-11 NOTE — Progress Notes (Signed)
Initial Nutrition Assessment  DOCUMENTATION CODES:   Underweight, Severe malnutrition in context of chronic illness  INTERVENTION:    Boost Breeze po TID, each supplement provides 250 kcal and 9 grams of protein  When diet advanced, can change supplement to chocolate or strawberry Ensure Enlive po TID, each supplement provides 350 kcal and 20 grams of protein  NUTRITION DIAGNOSIS:   Severe Malnutrition related to chronic illness(uncontrolled DM, suspected gastroparesis) as evidenced by severe muscle depletion, severe fat depletion.  GOAL:   Patient will meet greater than or equal to 90% of their needs  MONITOR:   PO intake, Supplement acceptance, Diet advancement  REASON FOR ASSESSMENT:   Consult Assessment of nutrition requirement/status  ASSESSMENT:   58 yo male admitted with AMS r/t severe DKA/HHS. PMH includes poorly controlled DM, severe PCM, gastroparesis, numerous psychiatric issues.   Discussed patient with RN today. Patient vomited last night and this morning after 2 bites of breakfast. SLP evaluation has been ordered. Vomiting could be related to gastroparesis.   Spoke with patient who reports that he has lost a lot of weight, and that he eats 3 meals per day at home. He is unsure why he has been losing weight or how much weight he has lost. He drinks Ensure supplements, likes chocolate and strawberry flavors.   Usual weights reviewed. Weights have been fluctuating.  64.4 kg 07/18/18 88.5 kg 09/13/18 and 11/10/18 50 kg 12/09/18 22% weight loss in the past 5 months is significant for the time frame.   Labs reviewed. Potassium 2.8 (L), phosphorus 4.2 WDL, BUN 135 (H), creatinine 9.2 (H) CBG's: 99-114  Medications reviewed and include novolog, levemir.  NUTRITION - FOCUSED PHYSICAL EXAM:    Most Recent Value  Orbital Region  Mild depletion  Upper Arm Region  Severe depletion  Thoracic and Lumbar Region  Severe depletion  Buccal Region  Severe depletion   Temple Region  Severe depletion  Clavicle Bone Region  Severe depletion  Clavicle and Acromion Bone Region  Severe depletion  Scapular Bone Region  Severe depletion  Dorsal Hand  Moderate depletion  Patellar Region  Moderate depletion  Anterior Thigh Region  Severe depletion  Posterior Calf Region  Moderate depletion  Edema (RD Assessment)  None  Hair  Reviewed  Eyes  Reviewed  Mouth  Reviewed  Skin  Reviewed  Nails  Reviewed       Diet Order:   Diet Order            Diet clear liquid Room service appropriate? Yes; Fluid consistency: Thin  Diet effective now              EDUCATION NEEDS:   Not appropriate for education at this time  Skin:  Skin Assessment: Skin Integrity Issues: Skin Integrity Issues:: Diabetic Ulcer Diabetic Ulcer: L big toe  Last BM:  PTA  Height:   Ht Readings from Last 1 Encounters:  12/08/18 6\' 3"  (1.905 m)    Weight:   Wt Readings from Last 1 Encounters:  12/09/18 50 kg    Ideal Body Weight:  89.1 kg  BMI:  Body mass index is 13.78 kg/m.  Estimated Nutritional Needs:   Kcal:  1750-2000  Protein:  85-100 gm  Fluid:  >/= 1.7 L    Molli Barrows, RD, LDN, [place] Pager [phone] After Hours Pager [phone]

## 2018-12-11 NOTE — Progress Notes (Signed)
PROGRESS NOTE    Timothy Madden  YOV:[id number] DOB: 24-Jun-1960 DOA: 12/08/2018 PCP: Marliss Coots, NP    Brief Narrative:  58 year old man with poorly controlled diabetes was brought from home when he was noted to be obtunded and found to be in severe DKA.  Was also noted to have acute AKI on CKD stage IV.  This was likely precipitated by sepsis secondary to UTI with Serratia bacteremia.  He is currently on IV antibiotics, IV fluids.  Renal function is slowly improving.  He is noted to have bilateral hydronephrosis.  Urology, nephrology following.  DKA has improved with IV fluids and intravenous insulin.  He has been transitioned to basal insulin.   Assessment & Plan:   Active Problems:   Type 2 diabetes mellitus with stage 3 chronic kidney disease (HCC)   Sepsis ([place])   Essential hypertension   Acute renal failure superimposed on chronic kidney disease (HCC)   DKA, type 1, not at goal [facility])   Protein-calorie malnutrition, severe   Acute lower UTI   Bacteremia due to Gram-negative bacteria   Bilateral hydronephrosis   DVT (deep venous thrombosis) (HCC)   Metabolic acidosis   Anemia due to chronic kidney disease   1. Sepsis secondary to UTI.  Blood cultures positive for Serratia.  Currently on intravenous ceftriaxone.  Hemodynamics are stabilizing. 2. Acute renal failure on chronic kidney disease stage IV.  Related to dehydration.  May also have a component of postobstructive causes.  He is currently on IV fluids and appears to be nonoliguric.  Creatinine is trending down.  Appreciate nephrology/urology input. 3. Bilateral hydronephrosis.  Seen by urology today.  Mentation to continue current treatments and repeat renal ultrasound in the next 2 days.  Interventional radiology also following for percutaneous nephrostomies. 4. Recent diagnosis of acute DVT in lower extremity.  Was prescribed Xarelto at home although compliance is questionable.  Currently on intravenous heparin. 5.  Diabetic ketoacidosis.  Suspect a degree of noncompliance.  Treated with intravenous insulin and IV fluids.  He has been transitioned back to basal insulin.  Blood sugars have been stable.  Continue to monitor. 6. Metabolic acidosis.  Secondary to acute renal failure.  Continue on bicarbonate fluids. 7. Anemia chronic kidney disease.  Checking iron stores.  Transfuse for hemoglobin less than 7.  No signs of bleeding at this time.  Decline in hemoglobin likely dilutional.   DVT prophylaxis: Heparin infusion Code Status: Full code Family Communication: None present Disposition Plan: May need placement, discharge home when renal function has improved.   Consultants:   Nephrology  urology  Procedures:     Antimicrobials:   Ceftriaxone 11/29>   Subjective: Denies any shortness of breath, abdominal pain, nausea or vomiting  Objective: Vitals:   12/11/18 1800 12/11/18 1900 12/11/18 2000 12/11/18 2100  BP: 124/60   116/75  Pulse:      Resp: 17 17 15 16   Temp: 97.9 F (36.6 C) 97.9 F (36.6 C) 97.6 F (36.4 C)   TempSrc:   Axillary   SpO2:      Weight:      Height:        Intake/Output Summary (Last 24 hours) at 12/11/2018 2222 Last data filed at 12/11/2018 2100 Gross per 24 hour  Intake 3210.59 ml  Output 5080 ml  Net -1869.41 ml   Filed Weights   12/08/18 1200 12/08/18 2050 12/09/18 0300  Weight: 86.9 kg 60.8 kg 50 kg    Examination:  General exam: Appears  calm and comfortable  Respiratory system: Clear to auscultation. Respiratory effort normal. Cardiovascular system: S1 & S2 heard, RRR. No JVD, murmurs, rubs, gallops or clicks. No pedal edema. Gastrointestinal system: Abdomen is nondistended, soft and nontender. No organomegaly or masses felt. Normal bowel sounds heard. Central nervous system: . No focal neurological deficits. Extremities: Symmetric 5 x 5 power. Skin: No rashes, lesions or ulcers Psychiatry: limited conversation. pleasant     Data  Reviewed: I have personally reviewed following labs and imaging studies  CBC: Recent Labs  Lab 12/08/18 1201 12/08/18 1224 12/09/18 2115 12/10/18 0329 12/10/18 2336  WBC 15.7*  --   --   --  20.2*  NEUTROABS 14.2*  --   --   --   --   HGB 12.3* 14.6 11.2* 8.2* 8.4*  HCT 42.8 43.0 33.0* 24.0* 23.6*  MCV 89.9  --   --   --  73.1*  PLT 573*  --   --   --  509   Basic Metabolic Panel: Recent Labs  Lab 12/10/18 0556 12/10/18 1445 12/10/18 2118 12/10/18 2336 12/11/18 0915  NA 143 144 142 143 142  K 3.0* 2.9* 2.8* 2.8* 2.9*  CL 112* 109 105 104 99  CO2 16* 19* 22 21* 26  GLUCOSE 196* 158* 120* 111* 160*  BUN 141* 128* 135* 135* 128*  CREATININE 9.87* 8.98* 9.56* 9.20* 8.97*  CALCIUM 7.0* 7.0* 7.1* 7.1* 6.9*  MG  --   --   --  1.9  --   PHOS 4.1  --   --  4.2 3.4   GFR: Estimated Creatinine Clearance: 6.4 mL/min (A) (by C-G formula based on SCr of 8.97 mg/dL (H)). Liver Function Tests: Recent Labs  Lab 12/08/18 1201 12/10/18 0556 12/11/18 0915  AST 23  --   --   ALT 14  --   --   ALKPHOS 101  --   --   BILITOT 0.9  --   --   PROT 9.4*  --   --   ALBUMIN 2.5* 1.4* 1.5*   No results for input(s): LIPASE, AMYLASE in the last 168 hours. No results for input(s): AMMONIA in the last 168 hours. Coagulation Profile: Recent Labs  Lab 12/08/18 1201  INR 1.5*   Cardiac Enzymes: No results for input(s): CKTOTAL, CKMB, CKMBINDEX, TROPONINI in the last 168 hours. BNP (last 3 results) No results for input(s): PROBNP in the last 8760 hours. HbA1C: No results for input(s): HGBA1C in the last 72 hours. CBG: Recent Labs  Lab 12/11/18 0309 12/11/18 0723 12/11/18 1209 12/11/18 1543 12/11/18 2025  GLUCAP 99 114* 164* 134* 380*   Lipid Profile: No results for input(s): CHOL, HDL, LDLCALC, TRIG, CHOLHDL, LDLDIRECT in the last 72 hours. Thyroid Function Tests: No results for input(s): TSH, T4TOTAL, FREET4, T3FREE, THYROIDAB in the last 72 hours. Anemia Panel: No results  for input(s): VITAMINB12, FOLATE, FERRITIN, TIBC, IRON, RETICCTPCT in the last 72 hours. Sepsis Labs: Recent Labs  Lab 12/08/18 1201 12/08/18 1351 12/08/18 1518 12/09/18 0610 12/09/18 1901 12/10/18 0556  PROCALCITON  --   --  33.61 54.88  --  48.11  LATICACIDVEN 4.6* 2.3*  --   --  1.2  --     Recent Results (from the past 240 hour(s))  Urine culture     Status: Abnormal   Collection Time: 12/07/18 11:45 AM   Specimen: In/Out Cath Urine  Result Value Ref Range Status   Specimen Description IN/OUT CATH URINE  Final  Special Requests   Final    NONE Performed at [facility], [place] [address] [place], [place] [address]    Culture >=100,000 COLONIES/mL SERRATIA MARCESCENS (A)  Final   Report Status 12/11/2018 FINAL  Final   Organism ID, Bacteria SERRATIA MARCESCENS (A)  Final      Susceptibility   Serratia marcescens - MIC*    CEFAZOLIN >=64 RESISTANT Resistant     CEFTRIAXONE <=1 SENSITIVE Sensitive     CIPROFLOXACIN <=0.25 SENSITIVE Sensitive     GENTAMICIN <=1 SENSITIVE Sensitive     NITROFURANTOIN 256 RESISTANT Resistant     TRIMETH/SULFA <=20 SENSITIVE Sensitive     * >=100,000 COLONIES/mL SERRATIA MARCESCENS  SARS CORONAVIRUS 2 (TAT 6-24 HRS) Nasopharyngeal Nasopharyngeal Swab     Status: None   Collection Time: 12/08/18 12:03 PM   Specimen: Nasopharyngeal Swab  Result Value Ref Range Status   SARS Coronavirus 2 NEGATIVE NEGATIVE Final    Comment: (NOTE) SARS-CoV-2 target nucleic acids are NOT DETECTED. The SARS-CoV-2 RNA is generally detectable in upper and lower respiratory specimens during the acute phase of infection. Negative results do not preclude SARS-CoV-2 infection, do not rule out co-infections with other pathogens, and should not be used as the sole basis for treatment or other patient management decisions. Negative results must be combined with clinical observations, patient history, and epidemiological information. The expected result is  Negative. Fact Sheet for Patients: SugarRoll.be Fact Sheet for Healthcare Providers: https://www.woods-mathews.com/ This test is not yet approved or cleared by the [place] FDA and  has been authorized for detection and/or diagnosis of SARS-CoV-2 by FDA under an Emergency Use Authorization (EUA). This EUA will remain  in effect (meaning this test can be used) for the duration of the COVID-19 declaration under Section 56 4(b)(1) of the Act, 21 U.S.C. section 360bbb-3(b)(1), unless the authorization is terminated or revoked sooner. Performed at [facility], [place] [address], [place], [place] [address]   Blood Culture (routine x 2)     Status: Abnormal   Collection Time: 12/08/18 12:25 PM   Specimen: BLOOD  Result Value Ref Range Status   Specimen Description BLOOD LEFT ANTECUBITAL  Final   Special Requests   Final    BOTTLES DRAWN AEROBIC AND ANAEROBIC Blood Culture results may not be optimal due to an excessive volume of blood received in culture bottles Performed at [place] [address], [place], [place] [address]    Culture  Setup Time   Final    GRAM NEGATIVE RODS IN BOTH AEROBIC AND ANAEROBIC BOTTLES    Culture SERRATIA MARCESCENS (A)  Final   Report Status 12/11/2018 FINAL  Final   Organism ID, Bacteria SERRATIA MARCESCENS  Final      Susceptibility   Serratia marcescens - MIC*    CEFAZOLIN >=64 RESISTANT Resistant     CEFEPIME <=1 SENSITIVE Sensitive     CEFTAZIDIME <=1 SENSITIVE Sensitive     CEFTRIAXONE <=1 SENSITIVE Sensitive     CIPROFLOXACIN <=0.25 SENSITIVE Sensitive     GENTAMICIN <=1 SENSITIVE Sensitive     TRIMETH/SULFA <=20 SENSITIVE Sensitive     * SERRATIA MARCESCENS  Blood Culture (routine x 2)     Status: Abnormal   Collection Time: 12/08/18 12:25 PM   Specimen: BLOOD LEFT WRIST  Result Value Ref Range Status   Specimen Description BLOOD LEFT WRIST  Final   Special Requests   Final     BOTTLES DRAWN AEROBIC ONLY Blood Culture  adequate volume   Culture  Setup Time   Final    GRAM NEGATIVE RODS AEROBIC BOTTLE ONLY CRITICAL VALUE NOTED.  VALUE IS CONSISTENT WITH PREVIOUSLY REPORTED AND CALLED VALUE.    Culture (A)  Final    SERRATIA MARCESCENS SUSCEPTIBILITIES PERFORMED ON PREVIOUS CULTURE WITHIN THE LAST 5 DAYS. Performed at [facility], [place] [address] [place], [place] [address]    Report Status 12/11/2018 FINAL  Final  Blood Culture ID Panel (Reflexed)     Status: Abnormal   Collection Time: 12/08/18 12:25 PM  Result Value Ref Range Status   Enterococcus species NOT DETECTED NOT DETECTED Final   Listeria monocytogenes NOT DETECTED NOT DETECTED Final   Staphylococcus species NOT DETECTED NOT DETECTED Final   Staphylococcus aureus (BCID) NOT DETECTED NOT DETECTED Final   Streptococcus species NOT DETECTED NOT DETECTED Final   Streptococcus agalactiae NOT DETECTED NOT DETECTED Final   Streptococcus pneumoniae NOT DETECTED NOT DETECTED Final   Streptococcus pyogenes NOT DETECTED NOT DETECTED Final   Acinetobacter baumannii NOT DETECTED NOT DETECTED Final   Enterobacteriaceae species DETECTED (A) NOT DETECTED Final    Comment: Enterobacteriaceae represent a large family of gram-negative bacteria, not a single organism. CRITICAL RESULT CALLED TO, READ BACK BY AND VERIFIED WITH: RN YOUSSEF MICHAEL AT [facility] ON 12/09/2018    Enterobacter cloacae complex NOT DETECTED NOT DETECTED Final   Escherichia coli NOT DETECTED NOT DETECTED Final   Klebsiella oxytoca NOT DETECTED NOT DETECTED Final   Klebsiella pneumoniae NOT DETECTED NOT DETECTED Final   Proteus species NOT DETECTED NOT DETECTED Final   Serratia marcescens DETECTED (A) NOT DETECTED Final    Comment: CRITICAL RESULT CALLED TO, READ BACK BY AND VERIFIED WITH: RN YOUSSEF MICHAEL AT [place] ON 12/09/2018    Carbapenem resistance NOT DETECTED NOT DETECTED Final    Haemophilus influenzae NOT DETECTED NOT DETECTED Final   Neisseria meningitidis NOT DETECTED NOT DETECTED Final   Pseudomonas aeruginosa NOT DETECTED NOT DETECTED Final   Candida albicans NOT DETECTED NOT DETECTED Final   Candida glabrata NOT DETECTED NOT DETECTED Final   Candida krusei NOT DETECTED NOT DETECTED Final   Candida parapsilosis NOT DETECTED NOT DETECTED Final   Candida tropicalis NOT DETECTED NOT DETECTED Final    Comment: Performed at [facility], [place] [address], [place], [place] [address]  MRSA PCR Screening     Status: Abnormal   Collection Time: 12/08/18  8:52 PM   Specimen: Nasal Mucosa; Nasopharyngeal  Result Value Ref Range Status   MRSA by PCR POSITIVE (A) NEGATIVE Final    Comment:        The GeneXpert MRSA Assay (FDA approved for NASAL specimens only), is one component of a comprehensive MRSA colonization surveillance program. It is not intended to diagnose MRSA infection nor to guide or monitor treatment for MRSA infections. RESULT CALLED TO, READ BACK BY AND VERIFIED WITH: YOUSEF,M RN 12/08/2018 AT 2213 SKEEN,P Performed at [facility], [place] [address], [place], [place] [address]          Radiology Studies: US Renal  Result Date: 12/10/2018 CLINICAL DATA:  Acute kidney injury EXAM: RENAL / URINARY TRACT ULTRASOUND COMPLETE COMPARISON:  July 19, 2018 FINDINGS: Right Kidney: Renal measurements: 12.8 x 5.8 x 6 cm = volume: 233 mL . Echogenicity within normal limits. No mass visualized. Mild hydronephrosis. Left Kidney: Renal measurements: 13.5 x 5.9 x 6 cm. = volume: 253 mL. Echogenicity within  normal limits. No mass visualized. Moderate hydronephrosis. Bladder: Bladder wall thickening seen again. Foley catheter is present. Echogenic foci along nondependent bladder wall likely reflect air. Other: None. IMPRESSION: Persistent left greater than right hydronephrosis and bladder wall thickening. Electronically Signed   By: Macy Mis M.D.   On:  12/10/2018 09:54        Scheduled Meds: . chlorhexidine  15 mL Mouth Rinse BID  . Chlorhexidine Gluconate Cloth  6 each Topical Q0600  . Chlorhexidine Gluconate Cloth  6 each Topical Q0600  . feeding supplement  1 Container Oral TID BM  . insulin aspart  0-15 Units Subcutaneous Q4H  . insulin detemir  10 Units Subcutaneous Q24H  . mouth rinse  15 mL Mouth Rinse q12n4p  . mupirocin ointment  1 application Nasal BID  . nitroGLYCERIN  1 inch Topical Q6H  . pantoprazole  40 mg Oral QHS   Continuous Infusions: . cefTRIAXone (ROCEPHIN)  IV Stopped (12/11/18 0447)  . heparin 1,150 Units/hr (12/11/18 1900)  . insulin Stopped (12/10/18 1406)  .  sodium bicarbonate (isotonic) infusion in sterile water 100 mL/hr at 12/11/18 1839     LOS: 3 days    Time spent: 25mins    Kathie Dike, MD [facility]   If 7PM-7AM, please contact night-coverage [email or web address]  12/11/2018, 10:22 PM

## 2018-12-11 NOTE — Progress Notes (Signed)
ANTICOAGULATION CONSULT NOTE - Follow Up Consult  Pharmacy Consult for heparin Indication: DVT  Labs: Recent Labs    12/08/18 1201  12/09/18 0014  12/09/18 0610  12/09/18 2115  12/10/18 0329 12/10/18 0556 12/10/18 0930 12/10/18 1440 12/10/18 1445 12/10/18 2118 12/10/18 2336  HGB 12.3*   < >  --   --   --   --  11.2*  --  8.2*  --   --   --   --   --  8.4*  HCT 42.8   < >  --   --   --   --  33.0*  --  24.0*  --   --   --   --   --  23.6*  PLT 573*  --   --   --   --   --   --   --   --   --   --   --   --   --  249  APTT 27  --  173*  --   --   --   --   --   --   --   --   --   --   --   --   LABPROT 18.3*  --   --   --   --   --   --   --   --   --   --   --   --   --   --   INR 1.5*  --   --   --   --   --   --   --   --   --   --   --   --   --   --   HEPARINUNFRC <0.10*  --  1.44*   < >  --    < >  --    < >  --  0.22*  --  0.28*  --   --  0.31  CREATININE 12.91*   < >  --    < > 9.88*   < >  --   --   --  9.87*  --   --  8.98* 9.56*  --   TROPONINIHS 30*   < >  --   --  76*  --   --   --   --  54* 54*  --   --   --   --    < > = values in this interval not displayed.    Assessment: 58yo male therapeutic on heparin though at very low end of goal; no gtt issues or signs of bleeding per RN.  Goal of Therapy:  Heparin level 0.3-0.7 units/ml   Plan:  Will increase heparin gtt slightly to 1100 units/hr and check level with am labs.    Wynona Neat, PharmD, BCPS  12/11/2018,12:47 AM

## 2018-12-11 NOTE — Progress Notes (Signed)
[facility] for Heparin  Indication: DVT  No Known Allergies  Patient Measurements: Height: 6\' 3"  (190.5 cm) Weight: 110 lb 3.7 oz (50 kg) IBW/kg (Calculated) : 84.5 Heparin Dosing Weight: 60.8 kg  Vital Signs: Temp: 98.4 F (36.9 C) (12/01 0800) Temp Source: Oral (12/01 0319) BP: 114/69 (12/01 0800) Pulse Rate: 89 (12/01 0800)  Labs: Recent Labs    12/08/18 1201  12/09/18 0014  12/09/18 0610  12/09/18 2115  12/10/18 0329 12/10/18 0556 12/10/18 0930 12/10/18 1440 12/10/18 1445 12/10/18 2118 12/10/18 2336 12/11/18 0455  HGB 12.3*   < >  --   --   --   --  11.2*  --  8.2*  --   --   --   --   --  8.4*  --   HCT 42.8   < >  --   --   --   --  33.0*  --  24.0*  --   --   --   --   --  23.6*  --   PLT 573*  --   --   --   --   --   --   --   --   --   --   --   --   --  249  --   APTT 27  --  173*  --   --   --   --   --   --   --   --   --   --   --   --   --   LABPROT 18.3*  --   --   --   --   --   --   --   --   --   --   --   --   --   --   --   INR 1.5*  --   --   --   --   --   --   --   --   --   --   --   --   --   --   --   HEPARINUNFRC <0.10*  --  1.44*   < >  --    < >  --    < >  --  0.22*  --  0.28*  --   --  0.31 0.32  CREATININE 12.91*   < >  --    < > 9.88*   < >  --   --   --  9.87*  --   --  8.98* 9.56* 9.20*  --   TROPONINIHS 30*   < >  --   --  76*  --   --   --   --  54* 54*  --   --   --   --   --    < > = values in this interval not displayed.    Estimated Creatinine Clearance: 6.3 mL/min (A) (by C-G formula based on SCr of 9.2 mg/dL (H)).  Assessment: Patient is a 58 year old male that was recently admitted at the end of last month and was diagnosed with a DVT. The patient was discharged on Xarelto. At this time the patient is unable to take PO medications and pharmacy has been consulted to dose heparin at this time.  Heparin level is therapeutic for 2 levels at 0.32 but low end of normal. Hgb is low-stable at  8.4. Platelets are within normal limits. Will increase Heparin rate slightly and follow daily levels. No bleeding or issues with infusion noted.   Goal of Therapy:  Heparin level 0.3-0.7 units/ml Monitor platelets by anticoagulation protocol: Yes   Plan:  - Increase Heparin to 1150 units/hr  - Daily Heparin level and CBC while on therapy. - Monitor for signs and symptoms of bleeding.  Sloan Leiter, PharmD, BCPS, BCCCP Clinical Pharmacist Please check AMION for all [place] numbers 12/11/2018 8:44 AM

## 2018-12-11 NOTE — Progress Notes (Signed)
Patient had a vomiting episode after 2 bites of breakfast this morning. I immediately suctioned and took away food tray. NP Gwyndolyn Saxon Minor was at bedside during this. Will obtain SLP evaluation.  Dewaine Oats, RN

## 2018-12-12 LAB — CBC
HCT: 26.9 % — ABNORMAL LOW (ref 39.0–52.0)
Hemoglobin: 9.4 g/dL — ABNORMAL LOW (ref 13.0–17.0)
MCH: 25.9 pg — ABNORMAL LOW (ref 26.0–34.0)
MCHC: 34.9 g/dL (ref 30.0–36.0)
MCV: 74.1 fL — ABNORMAL LOW (ref 80.0–100.0)
Platelets: 161 10*3/uL (ref 150–400)
RBC: 3.63 MIL/uL — ABNORMAL LOW (ref 4.22–5.81)
RDW: 14.9 % (ref 11.5–15.5)
WBC: 22.4 10*3/uL — ABNORMAL HIGH (ref 4.0–10.5)
nRBC: 0.1 % (ref 0.0–0.2)

## 2018-12-12 LAB — RENAL FUNCTION PANEL
Albumin: 1.6 g/dL — ABNORMAL LOW (ref 3.5–5.0)
Anion gap: 20 — ABNORMAL HIGH (ref 5–15)
BUN: 116 mg/dL — ABNORMAL HIGH (ref 6–20)
CO2: 35 mmol/L — ABNORMAL HIGH (ref 22–32)
Calcium: 6.9 mg/dL — ABNORMAL LOW (ref 8.9–10.3)
Chloride: 91 mmol/L — ABNORMAL LOW (ref 98–111)
Creatinine, Ser: 8.15 mg/dL — ABNORMAL HIGH (ref 0.61–1.24)
GFR calc Af Amer: 8 mL/min — ABNORMAL LOW (ref >60)
GFR calc non Af Amer: 7 mL/min — ABNORMAL LOW (ref >60)
Glucose, Bld: 113 mg/dL — ABNORMAL HIGH (ref 70–99)
Phosphorus: 4.1 mg/dL (ref 2.5–4.6)
Potassium: 2.4 mmol/L — CL (ref 3.5–5.1)
Sodium: 146 mmol/L — ABNORMAL HIGH (ref 135–145)

## 2018-12-12 LAB — GLUCOSE, CAPILLARY
Glucose-Capillary: 142 mg/dL — ABNORMAL HIGH (ref 70–99)
Glucose-Capillary: 145 mg/dL — ABNORMAL HIGH (ref 70–99)
Glucose-Capillary: 207 mg/dL — ABNORMAL HIGH (ref 70–99)
Glucose-Capillary: 233 mg/dL — ABNORMAL HIGH (ref 70–99)
Glucose-Capillary: 34 mg/dL — CL (ref 70–99)
Glucose-Capillary: 340 mg/dL — ABNORMAL HIGH (ref 70–99)
Glucose-Capillary: 89 mg/dL (ref 70–99)

## 2018-12-12 LAB — HEPARIN LEVEL (UNFRACTIONATED): Heparin Unfractionated: 0.36 IU/mL (ref 0.30–0.70)

## 2018-12-12 MED ORDER — SODIUM CHLORIDE 0.45 % IV SOLN
INTRAVENOUS | Status: DC
  Administered 2018-12-12: 14:00:00 via INTRAVENOUS
  Administered 2018-12-13: 125 mL/h via INTRAVENOUS

## 2018-12-12 MED ORDER — ENSURE ENLIVE PO LIQD
237.0000 mL | Freq: Three times a day (TID) | ORAL | Status: DC
  Administered 2018-12-12 – 2018-12-13 (×3): 237 mL via ORAL

## 2018-12-12 MED ORDER — DEXTROSE 50 % IV SOLN
INTRAVENOUS | Status: AC
  Filled 2018-12-12: qty 50

## 2018-12-12 MED ORDER — INSULIN ASPART 100 UNIT/ML ~~LOC~~ SOLN
0.0000 [IU] | Freq: Three times a day (TID) | SUBCUTANEOUS | Status: DC
  Administered 2018-12-12: 5 [IU] via SUBCUTANEOUS
  Administered 2018-12-13: 10:00:00 2 [IU] via SUBCUTANEOUS
  Administered 2018-12-13: 13:00:00 8 [IU] via SUBCUTANEOUS
  Administered 2018-12-13: 18:00:00 15 [IU] via SUBCUTANEOUS
  Administered 2018-12-14: 11:00:00 8 [IU] via SUBCUTANEOUS
  Administered 2018-12-14: 08:00:00 2 [IU] via SUBCUTANEOUS
  Administered 2018-12-14: 11 [IU] via SUBCUTANEOUS
  Administered 2018-12-15 (×2): 3 [IU] via SUBCUTANEOUS
  Administered 2018-12-15: 17:00:00 15 [IU] via SUBCUTANEOUS
  Administered 2018-12-16: 11 [IU] via SUBCUTANEOUS
  Administered 2018-12-16: 3 [IU] via SUBCUTANEOUS
  Administered 2018-12-16: 2 [IU] via SUBCUTANEOUS
  Administered 2018-12-17 – 2018-12-18 (×3): 8 [IU] via SUBCUTANEOUS
  Administered 2018-12-18: 5 [IU] via SUBCUTANEOUS
  Administered 2018-12-18 – 2018-12-19 (×2): 11 [IU] via SUBCUTANEOUS
  Administered 2018-12-19: 15 [IU] via SUBCUTANEOUS
  Administered 2018-12-19: 5 [IU] via SUBCUTANEOUS
  Administered 2018-12-20: 15 [IU] via SUBCUTANEOUS

## 2018-12-12 MED ORDER — METOCLOPRAMIDE HCL 5 MG/ML IJ SOLN: 5.0000 mg | Freq: Four times a day (QID) | INTRAMUSCULAR | Status: DC | PRN

## 2018-12-12 MED ORDER — POTASSIUM CHLORIDE 10 MEQ/100ML IV SOLN
10.0000 meq | INTRAVENOUS | Status: AC
  Administered 2018-12-12 (×4): 10 meq via INTRAVENOUS
  Filled 2018-12-12 (×4): qty 100

## 2018-12-12 MED ORDER — DEXTROSE 50 % IV SOLN
25.0000 mL | Freq: Once | INTRAVENOUS | Status: AC
  Administered 2018-12-12: 03:00:00 25 mL via INTRAVENOUS

## 2018-12-12 MED ORDER — POTASSIUM CHLORIDE CRYS ER 20 MEQ PO TBCR
40.0000 meq | EXTENDED_RELEASE_TABLET | Freq: Once | ORAL | Status: AC
  Administered 2018-12-12: 40 meq via ORAL
  Filled 2018-12-12: qty 2

## 2018-12-12 MED ORDER — POTASSIUM CHLORIDE 20 MEQ PO PACK
20.0000 meq | PACK | Freq: Once | ORAL | Status: AC
  Administered 2018-12-12: 07:00:00 20 meq via ORAL
  Filled 2018-12-12: qty 1

## 2018-12-12 MED ORDER — INSULIN ASPART 100 UNIT/ML ~~LOC~~ SOLN
0.0000 [IU] | Freq: Every day | SUBCUTANEOUS | Status: DC
  Administered 2018-12-13 – 2018-12-15 (×3): 2 [IU] via SUBCUTANEOUS
  Administered 2018-12-16: 3 [IU] via SUBCUTANEOUS
  Administered 2018-12-17: 2 [IU] via SUBCUTANEOUS
  Administered 2018-12-18: 3 [IU] via SUBCUTANEOUS

## 2018-12-12 NOTE — Progress Notes (Signed)
Patient ID: TARYLL REICHENBERGER, male   DOB: 09-Jun-1960, 58 y.o.   MRN: [id number] S: More awake and alert today.  Did not have PCN's placed per Urology request O:BP 90/74   Pulse 85   Temp 98.2 F (36.8 C)   Resp 16   Ht 6\' 3"  (1.905 m)   Wt 50 kg   SpO2 100%   BMI 13.78 kg/m   Intake/Output Summary (Last 24 hours) at 12/12/2018 1230 Last data filed at 12/12/2018 1100 Gross per 24 hour  Intake 3447.63 ml  Output 4195 ml  Net -747.37 ml   Intake/Output: I/O last 3 completed shifts: In: 4493.5 [P.O.:450; I.V.:3812.2; IV Piggyback:231.4] Out: 8338 [Urine:7150]  Intake/Output this shift:  Total I/O In: 997.8 [P.O.:340; I.V.:557.1; IV Piggyback:100.7] Out: 950 [Urine:950] Weight change:  Gen: NAD CVS: no rub Resp: cta Abd: +BS, soft, NT/ND Ext: no edema  Recent Labs  Lab 12/08/18 1201  12/09/18 1901  12/10/18 0329 12/10/18 0556 12/10/18 1445 12/10/18 2118 12/10/18 2336 12/11/18 0915 12/12/18 0404  NA 120*   < > 146*   < > 144 143 144 142 143 142 146*  K 7.2*   < > 3.9   < > 3.0* 3.0* 2.9* 2.8* 2.8* 2.9* 2.4*  CL 85*   < > 119*  --   --  112* 109 105 104 99 91*  CO2 10*   < > 10*  --   --  16* 19* 22 21* 26 35*  GLUCOSE 1,681*   < > 170*  --   --  196* 158* 120* 111* 160* 113*  BUN 182*   < > 156*  --   --  141* 128* 135* 135* 128* 116*  CREATININE 12.91*   < > 10.30*  --   --  9.87* 8.98* 9.56* 9.20* 8.97* 8.15*  ALBUMIN 2.5*  --   --   --   --  1.4*  --   --   --  1.5* 1.6*  CALCIUM 9.1   < > 7.4*  --   --  7.0* 7.0* 7.1* 7.1* 6.9* 6.9*  PHOS  --   --   --   --   --  4.1  --   --  4.2 3.4 4.1  AST 23  --   --   --   --   --   --   --   --   --   --   ALT 14  --   --   --   --   --   --   --   --   --   --    < > = values in this interval not displayed.   Liver Function Tests: Recent Labs  Lab 12/08/18 1201 12/10/18 0556 12/11/18 0915 12/12/18 0404  AST 23  --   --   --   ALT 14  --   --   --   ALKPHOS 101  --   --   --   BILITOT 0.9  --   --   --   PROT  9.4*  --   --   --   ALBUMIN 2.5* 1.4* 1.5* 1.6*   No results for input(s): LIPASE, AMYLASE in the last 168 hours. No results for input(s): AMMONIA in the last 168 hours. CBC: Recent Labs  Lab 12/08/18 1201  12/10/18 0329 12/10/18 2336 12/12/18 0404  WBC 15.7*  --   --  20.2* 22.4*  NEUTROABS 14.2*  --   --   --   --  HGB 12.3*   < > 8.2* 8.4* 9.4*  HCT 42.8   < > 24.0* 23.6* 26.9*  MCV 89.9  --   --  73.1* 74.1*  PLT 573*  --   --  249 161   < > = values in this interval not displayed.   Cardiac Enzymes: No results for input(s): CKTOTAL, CKMB, CKMBINDEX, TROPONINI in the last 168 hours. CBG: Recent Labs  Lab 12/12/18 0008 12/12/18 0312 12/12/18 0343 12/12/18 0723 12/12/18 1221  GLUCAP 145* 34* 142* 233* 340*    Iron Studies: No results for input(s): IRON, TIBC, TRANSFERRIN, FERRITIN in the last 72 hours. Studies/Results: No results found. . chlorhexidine  15 mL Mouth Rinse BID  . Chlorhexidine Gluconate Cloth  6 each Topical Q0600  . Chlorhexidine Gluconate Cloth  6 each Topical Q0600  . feeding supplement  1 Container Oral TID BM  . insulin aspart  0-15 Units Subcutaneous Q4H  . insulin detemir  10 Units Subcutaneous Q24H  . mouth rinse  15 mL Mouth Rinse q12n4p  . mupirocin ointment  1 application Nasal BID  . nitroGLYCERIN  1 inch Topical Q6H  . pantoprazole  40 mg Oral QHS    BMET    Component Value Date/Time   NA 146 (H) 12/12/2018 0404   K 2.4 (LL) 12/12/2018 0404   CL 91 (L) 12/12/2018 0404   CO2 35 (H) 12/12/2018 0404   GLUCOSE 113 (H) 12/12/2018 0404   BUN 116 (H) 12/12/2018 0404   CREATININE 8.15 (H) 12/12/2018 0404   CREATININE 1.95 (H) 11/11/2013 1212   CALCIUM 6.9 (L) 12/12/2018 0404   GFRNONAA 7 (L) 12/12/2018 0404   GFRNONAA 38 (L) 11/11/2013 1212   GFRAA 8 (L) 12/12/2018 0404   GFRAA 44 (L) 11/11/2013 1212   CBC    Component Value Date/Time   WBC 22.4 (H) 12/12/2018 0404   RBC 3.63 (L) 12/12/2018 0404   HGB 9.4 (L) 12/12/2018  0404   HCT 26.9 (L) 12/12/2018 0404   PLT 161 12/12/2018 0404   MCV 74.1 (L) 12/12/2018 0404   MCH 25.9 (L) 12/12/2018 0404   MCHC 34.9 12/12/2018 0404   RDW 14.9 12/12/2018 0404   LYMPHSABS 0.5 (L) 12/08/2018 1201   MONOABS 0.7 12/08/2018 1201   EOSABS 0.0 12/08/2018 1201   BASOSABS 0.1 12/08/2018 1201    Assessment/Plan:  1. AKI/CKD stage 4- in setting of DKA and persistent obstructive uropathy. Improving with IVF's and insulin, however BUN/Cr remain elevated. Renal US reveals persistent left >right hydronephrosis despite foley catheter.  1. BUN/Cr slowly improving but hydro remains and BUN/Cr remain above baseline. 2. Obstructive uropathy- discussed case with Dr. Louis Meckel who has reviewed the CT scan.  Will continue to watch for the next 24 hours and if no significant improvement, he has agreed to proceed with bilateral stent placement on 12/14/18.  Appreciate his input and care. 3. DKA/HONK- on insulin per primary svc 4. Sepsis from UTI/serratia bacteremia- on ceftriaxone per primary team 5. AMS- improving with improved glucose control 6. Metabolic acidosis due to #1- resolved with bicarb drip.  Will stop bicarb and change to 1/2 NS  7. Hypernatremia- will change to hypotonic fluid as he is not able to keep up with po water intake.  1. Free water deficit is 1.3liters 8. DVT- on IV heparin 9. HTN- new onset but presently resolved.  Possibly related to ICU on prn labetalol. 10. Hypokalemia- replete and follow 11. Anemia of CKD- check iron stores and may need  ESA. 12. Severe protein malnutrition- supplements per primary svc. Recommend nepro given advanced CKD at baseline 13. Psychiatric disorder- h/o noncompliance and poor self care 14. Disposition- will likely require placement when stable.  Donetta Potts, MD [employer] [phone]

## 2018-12-12 NOTE — Progress Notes (Signed)
Inpatient Diabetes Program Recommendations  AACE/ADA: New Consensus Statement on Inpatient Glycemic Control (2015)  Target Ranges:  Prepandial:   less than 140 mg/dL      Peak postprandial:   less than 180 mg/dL (1-2 hours)      Critically ill patients:  140 - 180 mg/dL   Lab Results  Component Value Date   GLUCAP 233 (H) 12/12/2018   HGBA1C 14.3 (H) 11/10/2018    Review of Glycemic Control Results for Timothy Madden, Timothy Madden (MRN [id number]) as of 12/12/2018 11:42  Ref. Range 12/11/2018 20:25 12/12/2018 00:08 12/12/2018 03:12 12/12/2018 03:43 12/12/2018 07:23  Glucose-Capillary Latest Ref Range: 70 - 99 mg/dL 380 (H) Novollog 15units 145 (H) 34 (LL) 142 (H) 233 (H) Novolog 5units    Diabetes history: DM2 Outpatient Diabetes medications: Levemir 30 units QD + Novolog 5 units TID with meals Current orders for Inpatient glycemic control: Levemir 10 units QD + Novolog 0-15 units TID with meals + 0-5 units QHS  Inpatient Diabetes Program Recommendations: Correction (SSI): Novolog 0-15 TID with meals Insulin- HS Coverage: 0-5 QHS   Noted:  Patient became hypoglycemic yesterday (34 mg/dl).  Patient did not receive Levemir dose yesterday and received Novolog 15 units for a BS of 380 mg/dl which likely contributed to the patient's low BS.     Thank you, Geoffry Paradise, RN, BSN Diabetes Coordinator Inpatient Diabetes Program [phone] (team pager from 8a-5p)

## 2018-12-12 NOTE — Progress Notes (Signed)
  Speech Language Pathology Treatment: Dysphagia  Patient Details Name: Timothy Madden MRN: [id number] DOB: 1961/01/01 Today's Date: 12/12/2018 Time: 6967-8938 SLP Time Calculation (min) (ACUTE ONLY): 16 min  Assessment / Plan / Recommendation Clinical Impression  Pt still has slowed manipulation of boluses and needs Min cues to clear oral cavity completely, but he has no overt s/s of aspiration. Occasional anterior loss is noted as oral residue spills forward after he swallows. Pt tries to take larger bites but responds well to Min cues for smaller ones. I think he could at least advance to a Dys 1 diet and thin liquids to get more nutritional intake, but given his nausea/vomiting, will defer advancement from liquid diet to MD. Will continue to follow.    HPI HPI: 58 yo male admitted with AMS r/t severe DKA/HHS. PMH includes poorly controlled DM, severe PCM, gastroparesis, numerous psychiatric issues.       SLP Plan  Continue with current plan of care       Recommendations  Diet recommendations: Dysphagia 1 (puree);Thin liquid Liquids provided via: Cup;Straw Medication Administration: Crushed with puree Supervision: Patient able to self feed;Full supervision/cueing for compensatory strategies Compensations: Minimize environmental distractions;Slow rate;Small sips/bites;Follow solids with liquid Postural Changes and/or Swallow Maneuvers: Seated upright 90 degrees;Upright 30-60 min after meal                Oral Care Recommendations: Oral care BID Follow up Recommendations: 24 hour supervision/assistance SLP Visit Diagnosis: Dysphagia, oral phase (R13.11) Plan: Continue with current plan of care       GO                Venita Sheffield Kedric Bumgarner 12/12/2018, 10:48 AM  Pollyann Glen, M.A. [place] Acute Environmental education officer [phone] Office [phone]

## 2018-12-12 NOTE — Progress Notes (Signed)
Initial Nutrition Assessment  DOCUMENTATION CODES:   Underweight, Severe malnutrition in context of chronic illness  INTERVENTION:    To maximize calorie and protein intake, change supplement to Ensure Enlive po TID, chocolate or strawberry flavor, each supplement provides 350 kcal and 20 grams of protein  NUTRITION DIAGNOSIS:   Severe Malnutrition related to chronic illness(uncontrolled DM, suspected gastroparesis) as evidenced by severe muscle depletion, severe fat depletion.  Ongoing  GOAL:   Patient will meet greater than or equal to 90% of their needs   Progressing  MONITOR:   PO intake, Supplement acceptance, Diet advancement  REASON FOR ASSESSMENT:   Consult Assessment of nutrition requirement/status  ASSESSMENT:   58 yo male admitted with AMS r/t severe DKA/HHS. PMH includes poorly controlled DM, severe PCM, gastroparesis, numerous psychiatric issues.   SLP following, diet was advanced to dysphagia 1 with thin liquids today. He consumed 50% of supper yesterday and 100% of breakfast today (both meals were full liquids). He has drank 2 Boost breeze supplements so far today.   Labs reviewed. Potassium 2.4 (L), phosphorus 4.1 WDL, BUN 116 (H), creatinine 8.15 (H) CBG's: [phone]  Medications reviewed and include novolog, levemir, IV KCl x 4 runs.   Diet Order:   Diet Order            DIET - DYS 1 Room service appropriate? Yes; Fluid consistency: Thin  Diet effective now              EDUCATION NEEDS:   Not appropriate for education at this time  Skin:  Skin Assessment: Skin Integrity Issues: Skin Integrity Issues:: Diabetic Ulcer Diabetic Ulcer: L big toe  Last BM:  12/1  Height:   Ht Readings from Last 1 Encounters:  12/08/18 6\' 3"  (1.905 m)    Weight:   Wt Readings from Last 1 Encounters:  12/09/18 50 kg    Ideal Body Weight:  89.1 kg  BMI:  Body mass index is 13.78 kg/m.  Estimated Nutritional Needs:   Kcal:   1750-2000  Protein:  85-100 gm  Fluid:  >/= 1.7 L    Molli Barrows, RD, LDN, [place] Pager [phone] After Hours Pager [phone]

## 2018-12-12 NOTE — Progress Notes (Signed)
CRITICAL VALUE ALERT  Critical Value:  k 2.7  Date & Time Notied:  12/12/18 1930  Provider Notified: Hartford Poli- [facility]  Orders Received/Actions taken: 61mEq K PO

## 2018-12-12 NOTE — Progress Notes (Signed)
ANTICOAGULATION CONSULT NOTE   Pharmacy Consult for Heparin  Indication: DVT  No Known Allergies  Patient Measurements: Height: 6\' 3"  (190.5 cm) Weight: 110 lb 3.7 oz (50 kg) IBW/kg (Calculated) : 84.5 Heparin Dosing Weight: 60.8 kg  Vital Signs: Temp: 97.9 F (36.6 C) (12/02 0726) Temp Source: Axillary (12/02 0000) BP: 123/70 (12/02 0900) Pulse Rate: 75 (12/02 0900)  Labs: Recent Labs    12/10/18 0329 12/10/18 0556 12/10/18 0930  12/10/18 2336 12/11/18 0455 12/11/18 0915 12/12/18 0404  HGB 8.2*  --   --   --  8.4*  --   --  9.4*  HCT 24.0*  --   --   --  23.6*  --   --  26.9*  PLT  --   --   --   --  249  --   --  161  HEPARINUNFRC  --  0.22*  --    < > 0.31 0.32  --  0.36  CREATININE  --  9.87*  --    < > 9.20*  --  8.97* 8.15*  TROPONINIHS  --  54* 54*  --   --   --   --   --    < > = values in this interval not displayed.    Estimated Creatinine Clearance: 7.1 mL/min (A) (by C-G formula based on SCr of 8.15 mg/dL (H)).  Assessment: Patient is a 58 year old male that was recently admitted at the end of last month, diagnosed with DVT and discharged on Xarelto. Heparin was started per pharmacy dosing due to inability to take PO medications. Full liquid diet started 12/1.  Heparin level remains therapeutic. No active bleed issues reported. Hg low but stable, plt trend down to 161.  Goal of Therapy:  Heparin level 0.3-0.7 units/ml Monitor platelets by anticoagulation protocol: Yes   Plan:  Continue heparin at 1150 units/hr Monitor daily heparin level and CBC, s/sx bleeding   Elicia Lamp, PharmD, BCPS Please check AMION for all [place] contact numbers Clinical Pharmacist 12/12/2018 9:45 AM

## 2018-12-12 NOTE — Progress Notes (Signed)
CRITICAL VALUE ALERT  Critical Value:  K 2.4  Date & Time Notied:  12/12/2018 @ 7793  Provider Notified: Lajean Manes via Amion text page  Orders Received/Actions taken: waiting on orders

## 2018-12-12 NOTE — Progress Notes (Signed)
PROGRESS NOTE    Timothy Madden  JHE:[id number] DOB: 1960-11-15 DOA: 12/08/2018 PCP: Marliss Coots, NP    Brief Narrative:  58 year old man with poorly controlled diabetes was brought from home when he was noted to be obtunded and found to be in severe DKA.  Was also noted to have acute AKI on CKD stage IV.  This was likely precipitated by sepsis secondary to UTI with Serratia bacteremia.  He is currently on IV antibiotics, IV fluids.  Renal function is slowly improving.  He is noted to have bilateral hydronephrosis.  Urology, nephrology following.  DKA has improved with IV fluids and intravenous insulin.  He has been transitioned to basal insulin.   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 Sepsis secondary to UTI.  Blood cultures positive for Serratia.  Currently on intravenous ceftriaxone.  Hemodynamics are stabilizing. No fevers. 2. Acute renal failure on chronic kidney disease stage IV.  Related to dehydration.  May also have a component of obstructive uropathy.  He is currently on IV fluids and appears to be nonoliguric.  Creatinine is trending down.  Appreciate nephrology/urology input. 3. Bilateral hydronephrosis.  Seen by urology.  Plan is to continue with current treatments and if no significant improvement, then he may need bilateral stent placement. 4. Recent diagnosis of acute DVT in lower extremity.  Was prescribed Xarelto at home although compliance is questionable.  Currently on intravenous heparin. 5. Diabetic ketoacidosis.  Suspect a  degree of noncompliance with medications. Treated with intravenous insulin and IV fluids.  He has been transitioned back to basal insulin.  Blood sugars have been labile.  Change SSI to [facility] and HS. Continue to monitor. Recent A1c on 10/31 was 14.3. advance diet to dysphagia 1.  6. Metabolic acidosis.  Secondary to acute renal failure.  Improved with bicarbonate containing fluids. 7. Anemia chronic kidney disease.  Checking iron stores.  Transfuse for hemoglobin less than 7.  No signs of bleeding at this time.  Decline in hemoglobin likely dilutional. Currently stable 8. Hypernatremia. Due to free water deficit. Patient has approximately 4L of urine output daily. He also does not have much po intake. IV fluids changed to hypotonic fluids 9. Hypokalemia. replace  10. Severe protein calore malnutrition. Nutritional consult 11. Acute metabolic encephalopathy. Related to DKA and uremia. Mental status appears to be improving. 12. Nausea and vomiting. None today. Possibly has some element of gastroparesis. Will try reglan prn for nausea   DVT prophylaxis: Heparin infusion Code Status: Full code Family Communication: None present Disposition Plan: May need placement for skilled care, discharge when renal function has improved.   Consultants:   Nephrology  Urology  Interventional radiology  Procedures:     Antimicrobials:   Ceftriaxone 11/29>   Subjective: Denies any shortness of breath or abdominal pain. Tolerated liquids this morning without vomiting.  Objective: Vitals:   12/12/18 0800 12/12/18 0900 12/12/18 1000 12/12/18 1100  BP: 122/76 123/70 107/70 90/74  Pulse: 73 75 71 85  Resp: 15 18  16 16  Temp:    98.2 F (36.8 C)  TempSrc:      SpO2: 100% 100% 99% 100%  Weight:      Height:        Intake/Output Summary (Last 24 hours) at 12/12/2018 1425 Last data filed at 12/12/2018 1100 Gross per 24 hour  Intake 3074.62 ml  Output 3445 ml  Net -370.38 ml   Filed Weights    12/08/18 1200 12/08/18 2050 12/09/18 0300  Weight: 86.9 kg 60.8 kg 50 kg    Examination:  General exam: Alert, awake, no distress Respiratory system: Clear to auscultation. Respiratory effort normal. Cardiovascular system:RRR. No murmurs, rubs, gallops. Gastrointestinal system: Abdomen is nondistended, soft and nontender. No organomegaly or masses felt. Normal bowel sounds heard. Central nervous system: No focal neurological deficits. Extremities: No C/C/E, +pedal pulses Skin: No rashes, lesions or ulcers Psychiatry: answers yes and no to questions. Pleasant, tries to engage  Data Reviewed: I have personally reviewed following labs and imaging studies  CBC: Recent Labs  Lab 12/08/18 1201 12/08/18 1224 12/09/18 2115 12/10/18 0329 12/10/18 2336 12/12/18 0404  WBC 15.7*  --   --   --  20.2* 22.4*  NEUTROABS 14.2*  --   --   --   --   --   HGB 12.3* 14.6 11.2* 8.2* 8.4* 9.4*  HCT 42.8 43.0 33.0* 24.0* 23.6* 26.9*  MCV 89.9  --   --   --  73.1* 74.1*  PLT 573*  --   --   --  249 629   Basic Metabolic Panel: Recent Labs  Lab 12/10/18 0556 12/10/18 1445 12/10/18 2118 12/10/18 2336 12/11/18 0915 12/12/18 0404  NA 143 144 142 143 142 146*  K 3.0* 2.9* 2.8* 2.8* 2.9* 2.4*  CL 112* 109 105 104 99 91*  CO2 16* 19* 22 21* 26 35*  GLUCOSE 196* 158* 120* 111* 160* 113*  BUN 141* 128* 135* 135* 128* 116*  CREATININE 9.87* 8.98* 9.56* 9.20* 8.97* 8.15*  CALCIUM 7.0* 7.0* 7.1* 7.1* 6.9* 6.9*  MG  --   --   --  1.9  --   --   PHOS 4.1  --   --  4.2 3.4 4.1   GFR: Estimated Creatinine Clearance: 7.1 mL/min (A) (by C-G formula based on SCr of 8.15 mg/dL (H)). Liver Function Tests: Recent Labs  Lab 12/08/18 1201 12/10/18 0556 12/11/18 0915 12/12/18 0404  AST 23  --   --   --   ALT 14  --   --   --   ALKPHOS 101  --   --   --   BILITOT 0.9  --   --   --   PROT 9.4*  --   --   --   ALBUMIN 2.5* 1.4* 1.5* 1.6*   No results for input(s): LIPASE, AMYLASE in the last 168  hours. No results for input(s): AMMONIA in the last 168 hours. Coagulation Profile: Recent Labs  Lab 12/08/18 1201  INR 1.5*   Cardiac Enzymes: No results for input(s): CKTOTAL, CKMB, CKMBINDEX, TROPONINI in the last 168 hours. BNP (last 3 results) No results for input(s): PROBNP in the last 8760 hours. HbA1C: No results for input(s): HGBA1C in the last 72 hours. CBG: Recent Labs  Lab 12/12/18 0008 12/12/18 0312 12/12/18 0343 12/12/18 0723 12/12/18 1221  GLUCAP 145* 34* 142* 233* 340*   Lipid Profile: No results for input(s): CHOL, HDL, LDLCALC, TRIG, CHOLHDL, LDLDIRECT in the last 72 hours. Thyroid  Function Tests: No results for input(s): TSH, T4TOTAL, FREET4, T3FREE, THYROIDAB in the last 72 hours. Anemia Panel: No results for input(s): VITAMINB12, FOLATE, FERRITIN, TIBC, IRON, RETICCTPCT in the last 72 hours. Sepsis Labs: Recent Labs  Lab 12/08/18 1201 12/08/18 1351 12/08/18 1518 12/09/18 0610 12/09/18 1901 12/10/18 0556  PROCALCITON  --   --  33.61 54.88  --  48.11  LATICACIDVEN 4.6* 2.3*  --   --  1.2  --     Recent Results (from the past 240 hour(s))  Urine culture     Status: Abnormal   Collection Time: 12/07/18 11:45 AM   Specimen: In/Out Cath Urine  Result Value Ref Range Status   Specimen Description IN/OUT CATH URINE  Final   Special Requests   Final    NONE Performed at [facility], [place] [address] [place], [place] [address]    Culture >=100,000 COLONIES/mL SERRATIA MARCESCENS (A)  Final   Report Status 12/11/2018 FINAL  Final   Organism ID, Bacteria SERRATIA MARCESCENS (A)  Final      Susceptibility   Serratia marcescens - MIC*    CEFAZOLIN >=64 RESISTANT Resistant     CEFTRIAXONE <=1 SENSITIVE Sensitive     CIPROFLOXACIN <=0.25 SENSITIVE Sensitive     GENTAMICIN <=1 SENSITIVE Sensitive     NITROFURANTOIN 256 RESISTANT Resistant     TRIMETH/SULFA <=20 SENSITIVE Sensitive     * >=100,000 COLONIES/mL SERRATIA MARCESCENS  SARS  CORONAVIRUS 2 (TAT 6-24 HRS) Nasopharyngeal Nasopharyngeal Swab     Status: None   Collection Time: 12/08/18 12:03 PM   Specimen: Nasopharyngeal Swab  Result Value Ref Range Status   SARS Coronavirus 2 NEGATIVE NEGATIVE Final    Comment: (NOTE) SARS-CoV-2 target nucleic acids are NOT DETECTED. The SARS-CoV-2 RNA is generally detectable in upper and lower respiratory specimens during the acute phase of infection. Negative results do not preclude SARS-CoV-2 infection, do not rule out co-infections with other pathogens, and should not be used as the sole basis for treatment or other patient management decisions. Negative results must be combined with clinical observations, patient history, and epidemiological information. The expected result is Negative. Fact Sheet for Patients: SugarRoll.be Fact Sheet for Healthcare Providers: https://www.woods-mathews.com/ This test is not yet approved or cleared by the [place] FDA and  has been authorized for detection and/or diagnosis of SARS-CoV-2 by FDA under an Emergency Use Authorization (EUA). This EUA will remain  in effect (meaning this test can be used) for the duration of the COVID-19 declaration under Section 56 4(b)(1) of the Act, 21 U.S.C. section 360bbb-3(b)(1), unless the authorization is terminated or revoked sooner. Performed at [facility], [place] [address] [place], [place] [address]   Blood Culture (routine x 2)     Status: Abnormal   Collection Time: 12/08/18 12:25 PM   Specimen: BLOOD  Result Value Ref Range Status   Specimen Description BLOOD LEFT ANTECUBITAL  Final   Special Requests   Final    BOTTLES DRAWN AEROBIC AND ANAEROBIC Blood Culture results may not be optimal due to an excessive volume of blood received in culture bottles Performed at [place] [address] [place], [place] [address]    Culture  Setup Time   Final    GRAM NEGATIVE RODS IN BOTH  AEROBIC AND ANAEROBIC BOTTLES    Culture SERRATIA MARCESCENS (A)  Final   Report Status 12/11/2018 FINAL  Final   Organism ID, Bacteria SERRATIA MARCESCENS  Final  Susceptibility   Serratia marcescens - MIC*    CEFAZOLIN >=64 RESISTANT Resistant     CEFEPIME <=1 SENSITIVE Sensitive     CEFTAZIDIME <=1 SENSITIVE Sensitive     CEFTRIAXONE <=1 SENSITIVE Sensitive     CIPROFLOXACIN <=0.25 SENSITIVE Sensitive     GENTAMICIN <=1 SENSITIVE Sensitive     TRIMETH/SULFA <=20 SENSITIVE Sensitive     * SERRATIA MARCESCENS  Blood Culture (routine x 2)     Status: Abnormal   Collection Time: 12/08/18 12:25 PM   Specimen: BLOOD LEFT WRIST  Result Value Ref Range Status   Specimen Description BLOOD LEFT WRIST  Final   Special Requests   Final    BOTTLES DRAWN AEROBIC ONLY Blood Culture adequate volume   Culture  Setup Time   Final    GRAM NEGATIVE RODS AEROBIC BOTTLE ONLY CRITICAL VALUE NOTED.  VALUE IS CONSISTENT WITH PREVIOUSLY REPORTED AND CALLED VALUE.    Culture (A)  Final    SERRATIA MARCESCENS SUSCEPTIBILITIES PERFORMED ON PREVIOUS CULTURE WITHIN THE LAST 5 DAYS. Performed at [facility], [place] [address] [place], [place] [address]    Report Status 12/11/2018 FINAL  Final  Blood Culture ID Panel (Reflexed)     Status: Abnormal   Collection Time: 12/08/18 12:25 PM  Result Value Ref Range Status   Enterococcus species NOT DETECTED NOT DETECTED Final   Listeria monocytogenes NOT DETECTED NOT DETECTED Final   Staphylococcus species NOT DETECTED NOT DETECTED Final   Staphylococcus aureus (BCID) NOT DETECTED NOT DETECTED Final   Streptococcus species NOT DETECTED NOT DETECTED Final   Streptococcus agalactiae NOT DETECTED NOT DETECTED Final   Streptococcus pneumoniae NOT DETECTED NOT DETECTED Final   Streptococcus pyogenes NOT DETECTED NOT DETECTED Final   Acinetobacter baumannii NOT DETECTED NOT DETECTED Final   Enterobacteriaceae species DETECTED (A) NOT DETECTED Final     Comment: Enterobacteriaceae represent a large family of gram-negative bacteria, not a single organism. CRITICAL RESULT CALLED TO, READ BACK BY AND VERIFIED WITH: RN YOUSSEF MICHAEL AT [place] ON 12/09/2018    Enterobacter cloacae complex NOT DETECTED NOT DETECTED Final   Escherichia coli NOT DETECTED NOT DETECTED Final   Klebsiella oxytoca NOT DETECTED NOT DETECTED Final   Klebsiella pneumoniae NOT DETECTED NOT DETECTED Final   Proteus species NOT DETECTED NOT DETECTED Final   Serratia marcescens DETECTED (A) NOT DETECTED Final    Comment: CRITICAL RESULT CALLED TO, READ BACK BY AND VERIFIED WITH: RN YOUSSEF MICHAEL AT [place] ON 12/09/2018    Carbapenem resistance NOT DETECTED NOT DETECTED Final   Haemophilus influenzae NOT DETECTED NOT DETECTED Final   Neisseria meningitidis NOT DETECTED NOT DETECTED Final   Pseudomonas aeruginosa NOT DETECTED NOT DETECTED Final   Candida albicans NOT DETECTED NOT DETECTED Final   Candida glabrata NOT DETECTED NOT DETECTED Final   Candida krusei NOT DETECTED NOT DETECTED Final   Candida parapsilosis NOT DETECTED NOT DETECTED Final   Candida tropicalis NOT DETECTED NOT DETECTED Final    Comment: Performed at [facility], [place] [address] [place], [place] [address]  MRSA PCR Screening     Status: Abnormal   Collection Time: 12/08/18  8:52 PM   Specimen: Nasal Mucosa; Nasopharyngeal  Result Value Ref Range Status   MRSA by PCR POSITIVE (A) NEGATIVE Final    Comment:        The GeneXpert MRSA Assay (FDA approved for NASAL specimens only), is one component of a comprehensive  MRSA colonization surveillance program. It is not intended to diagnose MRSA infection nor to guide or monitor treatment for MRSA infections. RESULT CALLED TO, READ BACK BY AND VERIFIED WITH: YOUSEF,M RN 12/08/2018 AT 2213 SKEEN,P Performed at [facility], [place] [address], [place], [place] [address]          Radiology  Studies: No results found.      Scheduled Meds: . chlorhexidine  15 mL Mouth Rinse BID  . Chlorhexidine Gluconate Cloth  6 each Topical Q0600  . Chlorhexidine Gluconate Cloth  6 each Topical Q0600  . feeding supplement  1 Container Oral TID BM  . insulin aspart  0-15 Units Subcutaneous Q4H  . insulin detemir  10 Units Subcutaneous Q24H  . mouth rinse  15 mL Mouth Rinse q12n4p  . mupirocin ointment  1 application Nasal BID  . nitroGLYCERIN  1 inch Topical Q6H  . pantoprazole  40 mg Oral QHS   Continuous Infusions: . sodium chloride 100 mL/hr at 12/12/18 1402  . cefTRIAXone (ROCEPHIN)  IV Stopped (12/12/18 1039)  . heparin 1,150 Units/hr (12/12/18 1410)  . potassium chloride 10 mEq (12/12/18 1356)     LOS: 4 days    Time spent: 43mins    Kathie Dike, MD [facility]   If 7PM-7AM, please contact night-coverage [email or web address]  12/12/2018, 2:25 PM

## 2018-12-13 ENCOUNTER — Inpatient Hospital Stay (HOSPITAL_COMMUNITY): Payer: Medicaid Other

## 2018-12-13 LAB — IRON AND TIBC
Iron: 18 ug/dL — ABNORMAL LOW (ref 45–182)
Saturation Ratios: 14 % — ABNORMAL LOW (ref 17.9–39.5)
TIBC: 125 ug/dL — ABNORMAL LOW (ref 250–450)
UIBC: 107 ug/dL

## 2018-12-13 LAB — RENAL FUNCTION PANEL
Albumin: 1.6 g/dL — ABNORMAL LOW (ref 3.5–5.0)
Anion gap: 17 — ABNORMAL HIGH (ref 5–15)
BUN: 96 mg/dL — ABNORMAL HIGH (ref 6–20)
CO2: 35 mmol/L — ABNORMAL HIGH (ref 22–32)
Calcium: 6.4 mg/dL — CL (ref 8.9–10.3)
Chloride: 87 mmol/L — ABNORMAL LOW (ref 98–111)
Creatinine, Ser: 6.87 mg/dL — ABNORMAL HIGH (ref 0.61–1.24)
GFR calc Af Amer: 9 mL/min — ABNORMAL LOW (ref >60)
GFR calc non Af Amer: 8 mL/min — ABNORMAL LOW (ref >60)
Glucose, Bld: 108 mg/dL — ABNORMAL HIGH (ref 70–99)
Phosphorus: 3.6 mg/dL (ref 2.5–4.6)
Potassium: 2.9 mmol/L — ABNORMAL LOW (ref 3.5–5.1)
Sodium: 139 mmol/L (ref 135–145)

## 2018-12-13 LAB — FERRITIN: Ferritin: 1260 ng/mL — ABNORMAL HIGH (ref 24–336)

## 2018-12-13 LAB — HEPARIN LEVEL (UNFRACTIONATED)
Heparin Unfractionated: 0.22 IU/mL — ABNORMAL LOW (ref 0.30–0.70)
Heparin Unfractionated: 0.22 IU/mL — ABNORMAL LOW (ref 0.30–0.70)

## 2018-12-13 LAB — CBC
HCT: 27.4 % — ABNORMAL LOW (ref 39.0–52.0)
Hemoglobin: 9.2 g/dL — ABNORMAL LOW (ref 13.0–17.0)
MCH: 25.7 pg — ABNORMAL LOW (ref 26.0–34.0)
MCHC: 33.6 g/dL (ref 30.0–36.0)
MCV: 76.5 fL — ABNORMAL LOW (ref 80.0–100.0)
Platelets: 173 10*3/uL (ref 150–400)
RBC: 3.58 MIL/uL — ABNORMAL LOW (ref 4.22–5.81)
RDW: 14.9 % (ref 11.5–15.5)
WBC: 24.8 10*3/uL — ABNORMAL HIGH (ref 4.0–10.5)
nRBC: 0.1 % (ref 0.0–0.2)

## 2018-12-13 LAB — GLUCOSE, CAPILLARY
Glucose-Capillary: 122 mg/dL — ABNORMAL HIGH (ref 70–99)
Glucose-Capillary: 290 mg/dL — ABNORMAL HIGH (ref 70–99)
Glucose-Capillary: 289 mg/dL — ABNORMAL HIGH (ref 70–99)
Glucose-Capillary: 340 mg/dL — ABNORMAL HIGH (ref 70–99)
Glucose-Capillary: 226 mg/dL — ABNORMAL HIGH (ref 70–99)

## 2018-12-13 LAB — MAGNESIUM: Magnesium: 1.4 mg/dL — ABNORMAL LOW (ref 1.7–2.4)

## 2018-12-13 MED ORDER — SODIUM CHLORIDE 0.45 % IV SOLN
INTRAVENOUS | Status: DC
  Administered 2018-12-13: 10:00:00 via INTRAVENOUS
  Filled 2018-12-13 (×2): qty 1000

## 2018-12-13 MED ORDER — SODIUM CHLORIDE 0.45 % IV SOLN
INTRAVENOUS | Status: DC
  Administered 2018-12-13: 18:00:00 via INTRAVENOUS
  Filled 2018-12-13 (×2): qty 1000

## 2018-12-13 MED ORDER — ENSURE ENLIVE PO LIQD: 237.0000 mL | ORAL | Status: DC

## 2018-12-13 MED ORDER — POTASSIUM CHLORIDE 10 MEQ/100ML IV SOLN
10.0000 meq | INTRAVENOUS | Status: AC
  Administered 2018-12-13: 17:00:00 10 meq via INTRAVENOUS
  Filled 2018-12-13 (×4): qty 100

## 2018-12-13 MED ORDER — SODIUM CHLORIDE 0.9 % IV SOLN
INTRAVENOUS | Status: DC | PRN
  Administered 2018-12-13: 15:00:00 250 mL via INTRAVENOUS

## 2018-12-13 MED ORDER — MAGNESIUM SULFATE IN D5W 1-5 GM/100ML-% IV SOLN
1.0000 g | Freq: Once | INTRAVENOUS | Status: AC
  Administered 2018-12-13: 1 g via INTRAVENOUS
  Filled 2018-12-13: qty 100

## 2018-12-13 MED ORDER — POTASSIUM CHLORIDE 10 MEQ/100ML IV SOLN
10.0000 meq | INTRAVENOUS | Status: AC
  Administered 2018-12-13 (×3): 10 meq via INTRAVENOUS

## 2018-12-13 NOTE — Progress Notes (Signed)
Hospitalist progress note + chart summary  Timothy Madden [id number] DOB: 10/24/1960 DOA: 12/08/2018  PCP: Marliss Coots, NP   Narrative:  59 year old male DM TY 2 CKD 3 (2.5-2.7 creatinine) previous osteomyelitis recent DVT, psychiatric issues?  Homeless hypoechoic mass kidneys admit ICU 1128 obtunded in severe DKA-developed severe AKI creatinine 12 potassium 7-urology and nephrology saw-recommended Foley to gravity Rx urinary tract infection  Data Reviewed:  Potassium 2.9 BUN/creatinine 96/6.835 (on admission 182/12.9) magnesium 1.4 albumin 1.6 calcium 6.4 Iron 18 TIBC 125 White count 24 up from 22 globin 9.2 platelets 173 Assessment & Plan: DKA uncontrolled diabetes mellitus 15-DKA physiology resolved-sugars-89-90 continuing mechanical soft diet Levemir 10 and moderate sliding scale coverage AKI, hypokalemia, hypocalcemia + hypomagnesemia, metabolic alkalosis Replacing potassium with half normal saline 40+ K+ K. 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-urology recommends cautious waiting until 12/3 holding off of nephrostomy tubes currently Serratia UTI-continue Rocephin stop date 12/4 DVT 10/31-continue IV heparin for now transition when further clarity regarding whether further procedures to be done Anemia of renal disease-iron levels are somewhat low TIBC is low but in the setting of infection may choose to hold on further replacement Psychiatric disorder-reported in chart but no meds and also reported he was homeless will need to inquire further on Severe protein energy malnutrition BMI 13-supplement as needed-have cut back to once a day and stopped reglan--encourage diet as patient has probable diarr from supplements  No family present-transition to telemetry-expect will be here several days needs skilled facility placement Not ready for discharge   CHART REVIEW . Admission 10/31  through 11/5 lower extremity swelling found to have DVT placed indwelling Foley catheter apparently 07/2018-was found to also have ectopic insertion of the ureters Lateral?  Leading to possible dysfunctional ureterovesicular junction . Admission for DKA 6/18 through 6/22-at that time found to have hypoechoic mass . Admission 10/16 through 11/02/2013 osteomyelitis left finger with amputation at that time  Subjective: Coherent but slow speech Groaning outside room No cp no fever no chills Wants Foley "out"  Consultants:   uro  nephro Procedures:   n Antimicrobials:   Ceftriaxone till 12/4   Objective: Vitals:   12/13/18 0900 12/13/18 1000 12/13/18 1100 12/13/18 1217  BP: 103/78 105/65 104/68 110/65  Pulse: 89 93 86 97  Resp: 18 20 (!) 22 19  Temp:    98.3 F (36.8 C)  TempSrc:    Oral  SpO2: 100% 100% 100% 98%  Weight:      Height:        Intake/Output Summary (Last 24 hours) at 12/13/2018 1330 Last data filed at 12/13/2018 1230 Gross per 24 hour  Intake 3649.1 ml  Output 3700 ml  Net -50.9 ml   Filed Weights   12/08/18 1200 12/08/18 2050 12/09/18 0300  Weight: 86.9 kg 60.8 kg 50 kg    Examination: eomi frail dischevlled Intermittently making sense cta b no added sound FOleyu in [place] in abd soft nt No le edema  moves limbs x 4  Scheduled Meds: . chlorhexidine  15 mL Mouth Rinse BID  . Chlorhexidine Gluconate Cloth  6 each Topical Q0600  . feeding supplement (ENSURE ENLIVE)  237 mL Oral TID BM  . insulin aspart  0-15 Units Subcutaneous TID WC  . insulin aspart  0-5 Units Subcutaneous QHS  . insulin detemir  10 Units Subcutaneous Q24H  . mouth rinse  15 mL Mouth Rinse q12n4p  . nitroGLYCERIN  1 inch Topical Q6H  . pantoprazole  40 mg Oral QHS   Continuous Infusions: . cefTRIAXone (ROCEPHIN)  IV Stopped (12/13/18 0840)  . heparin 1,300 Units/hr (12/13/18 1112)  . potassium chloride    . sodium chloride 0.45 % with kcl       LOS: 5 days   Time  spent: [place], MD [facility]  12/13/2018, 1:30 PM

## 2018-12-13 NOTE — Progress Notes (Signed)
Physical Therapy Treatment Patient Details Name: Timothy Madden MRN: [id number] DOB: 06/13/1960 Today's Date: 12/13/2018    History of Present Illness This is a 58 year old man with a history of numerous psychiatric issues, diabetes poorly controlled supposed to be on home insulin presenting with obtundation from home found to be in severe DKA/HOCM.  PMH DKA, DM, L second finger amputation.    PT Comments    Slow progress. Continue to recommend SNF.    Follow Up Recommendations  SNF     Equipment Recommendations  Rolling walker with 5" wheels    Recommendations for Other Services       Precautions / Restrictions Precautions Precautions: Fall Restrictions Weight Bearing Restrictions: No    Mobility  Bed Mobility Overal bed mobility: Needs Assistance Bed Mobility: Supine to Sit     Supine to sit: +2 for physical assistance;Max assist;HOB elevated     General bed mobility comments: Assist to bring legs off of bed, elevate trunk into sitting and bring hips to EOB.  Transfers Overall transfer level: Needs assistance Equipment used: 2 person hand held assist Transfers: Stand Pivot Transfers;Sit to/from Stand Sit to Stand: +2 physical assistance;Min assist;Mod assist Stand pivot transfers: +2 physical assistance;Min assist       General transfer comment: Assist to bring hips up and for balance. Initial stand from bed required mod assist but second stand from chair with +2 min assist. Incr time to process and rise.  Ambulation/Gait                 Stairs             Wheelchair Mobility    Modified Rankin (Stroke Patients Only)       Balance Overall balance assessment: Needs assistance Sitting-balance support: Feet supported;No upper extremity supported Sitting balance-Leahy Scale: Fair     Standing balance support: Bilateral upper extremity supported Standing balance-Leahy Scale: Poor Standing balance comment: Stood x 30 sec with +2 min  assist with hand held                            Cognition Arousal/Alertness: Awake/alert Behavior During Therapy: Flat affect Overall Cognitive Status: No family/caregiver present to determine baseline cognitive functioning Area of Impairment: Attention;Following commands;Safety/judgement;Memory;Problem solving                   Current Attention Level: Focused Memory: Decreased short-term memory Following Commands: Follows one step commands inconsistently;Follows one step commands with increased time Safety/Judgement: Decreased awareness of deficits   Problem Solving: Slow processing;Decreased initiation;Difficulty sequencing;Requires verbal cues;Requires tactile cues        Exercises      General Comments        Pertinent Vitals/Pain Pain Assessment: Faces Faces Pain Scale: No hurt    Home Living                      Prior Function            PT Goals (current goals can now be found in the care plan section) Progress towards PT goals: Progressing toward goals    Frequency    Min 2X/week      PT Plan Current plan remains appropriate    Co-evaluation              AM-PAC PT "6 Clicks" Mobility   Outcome Measure  Help needed turning from your back to your side while in  a flat bed without using bedrails?: A Lot Help needed moving from lying on your back to sitting on the side of a flat bed without using bedrails?: Total Help needed moving to and from a bed to a chair (including a wheelchair)?: A Lot Help needed standing up from a chair using your arms (e.g., wheelchair or bedside chair)?: A Little Help needed to walk in hospital room?: Total Help needed climbing 3-5 steps with a railing? : Total 6 Click Score: 10    End of Session Equipment Utilized During Treatment: Gait belt Activity Tolerance: Patient limited by fatigue Patient left: in chair;with call bell/phone within reach Nurse Communication: Mobility status PT  Visit Diagnosis: Other abnormalities of gait and mobility (R26.89);Muscle weakness (generalized) (M62.81);Other symptoms and signs involving the nervous system (R29.898)     Time: 1828-8337 PT Time Calculation (min) (ACUTE ONLY): 30 min  Charges:  $Therapeutic Activity: 23-37 mins                     [place] Pager [phone] Office [place] 12/13/2018, 2:55 PM

## 2018-12-13 NOTE — Progress Notes (Signed)
Subjective: patietn has had 3.9L urine output in the past 24 hours. Creatinine 6.87.   Objective: Vital signs in last 24 hours: Temp:  [97.8 F (36.6 C)-98.5 F (36.9 C)] 98.2 F (36.8 C) (12/03 1548) Pulse Rate:  [86-97] 97 (12/03 1217) Resp:  [12-22] 19 (12/03 1217) BP: (103-121)/(58-78) 110/65 (12/03 1217) SpO2:  [98 %-100 %] 98 % (12/03 1217)  Intake/Output from previous day: 12/02 0701 - 12/03 0700 In: 4023 [P.O.:440; I.V.:3078.2; IV Piggyback:504.8] Out: 3900 [Urine:3900] Intake/Output this shift: Total I/O In: 842.3 [P.O.:120; I.V.:500; IV Piggyback:222.3] Out: 750 [Urine:750]  Physical Exam:  General:alert, cooperative and appears stated age GI: soft, non tender, normal bowel sounds, no palpable masses, no organomegaly, no inguinal hernia Male genitalia: not done Extremities: extremities normal, atraumatic, no cyanosis or edema  Lab Results: Recent Labs    12/10/18 2336 12/12/18 0404 12/13/18 0430  HGB 8.4* 9.4* 9.2*  HCT 23.6* 26.9* 27.4*   BMET Recent Labs    12/12/18 1835 12/13/18 0430  NA 139 139  K 2.7* 2.9*  CL 86* 87*  CO2 37* 35*  GLUCOSE 221* 108*  BUN 101* 96*  CREATININE 7.06* 6.87*  CALCIUM 6.6* 6.4*   No results for input(s): LABPT, INR in the last 72 hours. No results for input(s): LABURIN in the last 72 hours. Results for orders placed or performed during the hospital encounter of 12/08/18  Urine culture     Status: Abnormal   Collection Time: 12/07/18 11:45 AM   Specimen: In/Out Cath Urine  Result Value Ref Range Status   Specimen Description IN/OUT CATH URINE  Final   Special Requests   Final    NONE Performed at [facility], [address], [place], [place] [address]    Culture >=100,000 COLONIES/mL SERRATIA MARCESCENS (A)  Final   Report Status 12/11/2018 FINAL  Final   Organism ID, Bacteria SERRATIA MARCESCENS (A)  Final      Susceptibility   Serratia marcescens - MIC*    CEFAZOLIN >=64 RESISTANT Resistant    CEFTRIAXONE <=1 SENSITIVE Sensitive     CIPROFLOXACIN <=0.25 SENSITIVE Sensitive     GENTAMICIN <=1 SENSITIVE Sensitive     NITROFURANTOIN 256 RESISTANT Resistant     TRIMETH/SULFA <=20 SENSITIVE Sensitive     * >=100,000 COLONIES/mL SERRATIA MARCESCENS  SARS CORONAVIRUS 2 (TAT 6-24 HRS) Nasopharyngeal Nasopharyngeal Swab     Status: None   Collection Time: 12/08/18 12:03 PM   Specimen: Nasopharyngeal Swab  Result Value Ref Range Status   SARS Coronavirus 2 NEGATIVE NEGATIVE Final    Comment: (NOTE) SARS-CoV-2 target nucleic acids are NOT DETECTED. The SARS-CoV-2 RNA is generally detectable in upper and lower respiratory specimens during the acute phase of infection. Negative results do not preclude SARS-CoV-2 infection, do not rule out co-infections with other pathogens, and should not be used as the sole basis for treatment or other patient management decisions. Negative results must be combined with clinical observations, patient history, and epidemiological information. The expected result is Negative. Fact Sheet for Patients: SugarRoll.be Fact Sheet for Healthcare Providers: https://www.woods-mathews.com/ This test is not yet approved or cleared by the [place] FDA and  has been authorized for detection and/or diagnosis of SARS-CoV-2 by FDA under an Emergency Use Authorization (EUA). This EUA will remain  in effect (meaning this test can be used) for the duration of the COVID-19 declaration under Section 56 4(b)(1) of the Act, 21 U.S.C. section 360bbb-3(b)(1), unless the authorization is terminated or revoked sooner. Performed at  [facility], [place] [address] [place], [place] [address]   Blood Culture (routine x 2)     Status: Abnormal   Collection Time: 12/08/18 12:25 PM   Specimen: BLOOD  Result Value Ref Range Status   Specimen Description BLOOD LEFT ANTECUBITAL  Final   Special Requests   Final    BOTTLES DRAWN  AEROBIC AND ANAEROBIC Blood Culture results may not be optimal due to an excessive volume of blood received in culture bottles Performed at [facility] [address], [place], [place] [address]    Culture  Setup Time   Final    GRAM NEGATIVE RODS IN BOTH AEROBIC AND ANAEROBIC BOTTLES    Culture SERRATIA MARCESCENS (A)  Final   Report Status 12/11/2018 FINAL  Final   Organism ID, Bacteria SERRATIA MARCESCENS  Final      Susceptibility   Serratia marcescens - MIC*    CEFAZOLIN >=64 RESISTANT Resistant     CEFEPIME <=1 SENSITIVE Sensitive     CEFTAZIDIME <=1 SENSITIVE Sensitive     CEFTRIAXONE <=1 SENSITIVE Sensitive     CIPROFLOXACIN <=0.25 SENSITIVE Sensitive     GENTAMICIN <=1 SENSITIVE Sensitive     TRIMETH/SULFA <=20 SENSITIVE Sensitive     * SERRATIA MARCESCENS  Blood Culture (routine x 2)     Status: Abnormal   Collection Time: 12/08/18 12:25 PM   Specimen: BLOOD LEFT WRIST  Result Value Ref Range Status   Specimen Description BLOOD LEFT WRIST  Final   Special Requests   Final    BOTTLES DRAWN AEROBIC ONLY Blood Culture adequate volume   Culture  Setup Time   Final    GRAM NEGATIVE RODS AEROBIC BOTTLE ONLY CRITICAL VALUE NOTED.  VALUE IS CONSISTENT WITH PREVIOUSLY REPORTED AND CALLED VALUE.    Culture (A)  Final    SERRATIA MARCESCENS SUSCEPTIBILITIES PERFORMED ON PREVIOUS CULTURE WITHIN THE LAST 5 DAYS. Performed at [facility], [place] [address], [place], [place] [address]    Report Status 12/11/2018 FINAL  Final  Blood Culture ID Panel (Reflexed)     Status: Abnormal   Collection Time: 12/08/18 12:25 PM  Result Value Ref Range Status   Enterococcus species NOT DETECTED NOT DETECTED Final   Listeria monocytogenes NOT DETECTED NOT DETECTED Final   Staphylococcus species NOT DETECTED NOT DETECTED Final   Staphylococcus aureus (BCID) NOT DETECTED NOT DETECTED Final   Streptococcus species NOT DETECTED NOT DETECTED Final   Streptococcus agalactiae NOT  DETECTED NOT DETECTED Final   Streptococcus pneumoniae NOT DETECTED NOT DETECTED Final   Streptococcus pyogenes NOT DETECTED NOT DETECTED Final   Acinetobacter baumannii NOT DETECTED NOT DETECTED Final   Enterobacteriaceae species DETECTED (A) NOT DETECTED Final    Comment: Enterobacteriaceae represent a large family of gram-negative bacteria, not a single organism. CRITICAL RESULT CALLED TO, READ BACK BY AND VERIFIED WITH: RN YOUSSEF MICHAEL AT 2542 BY [place] ON 12/09/2018    Enterobacter cloacae complex NOT DETECTED NOT DETECTED Final   Escherichia coli NOT DETECTED NOT DETECTED Final   Klebsiella oxytoca NOT DETECTED NOT DETECTED Final   Klebsiella pneumoniae NOT DETECTED NOT DETECTED Final   Proteus species NOT DETECTED NOT DETECTED Final   Serratia marcescens DETECTED (A) NOT DETECTED Final    Comment: CRITICAL RESULT CALLED TO, READ BACK BY AND VERIFIED WITH: RN YOUSSEF MICHAEL AT [place] ON 12/09/2018    Carbapenem resistance NOT DETECTED NOT DETECTED Final   Haemophilus influenzae NOT  DETECTED NOT DETECTED Final   Neisseria meningitidis NOT DETECTED NOT DETECTED Final   Pseudomonas aeruginosa NOT DETECTED NOT DETECTED Final   Candida albicans NOT DETECTED NOT DETECTED Final   Candida glabrata NOT DETECTED NOT DETECTED Final   Candida krusei NOT DETECTED NOT DETECTED Final   Candida parapsilosis NOT DETECTED NOT DETECTED Final   Candida tropicalis NOT DETECTED NOT DETECTED Final    Comment: Performed at [facility], [place] [address] [place], [place] [address]  MRSA PCR Screening     Status: Abnormal   Collection Time: 12/08/18  8:52 PM   Specimen: Nasal Mucosa; Nasopharyngeal  Result Value Ref Range Status   MRSA by PCR POSITIVE (A) NEGATIVE Final    Comment:        The GeneXpert MRSA Assay (FDA approved for NASAL specimens only), is one component of a comprehensive MRSA colonization surveillance program. It is not intended to diagnose  MRSA infection nor to guide or monitor treatment for MRSA infections. RESULT CALLED TO, READ BACK BY AND VERIFIED WITH: YOUSEF,M RN 12/08/2018 AT 2213 SKEEN,P Performed at [facility], [place] [address] [place], [place] [address]     Studies/Results: No results found.  Assessment/Plan: 58yo with bilateral hydronephrosis, urinary retention  1. Urinary retention: please continue foley catheter to straight drain 2. Bilateral hydronephrosis: We will repeat renal US 12/4 and if the patient has persistent significant hydronephrosis we will pursue bilateral stent placement.    LOS: 5 days   Nicolette Bang 12/13/2018, 5:48 PM

## 2018-12-13 NOTE — Progress Notes (Signed)
Inpatient Diabetes Program Recommendations  AACE/ADA: New Consensus Statement on Inpatient Glycemic Control (2015)  Target Ranges:  Prepandial:   less than 140 mg/dL      Peak postprandial:   less than 180 mg/dL (1-2 hours)      Critically ill patients:  140 - 180 mg/dL   Lab Results  Component Value Date   GLUCAP 290 (H) 12/13/2018   HGBA1C 14.3 (H) 11/10/2018   Diabetes history: DM2 Outpatient Diabetes medications: Levemir 30 units QD + Novolog 5 units TID with meals Current orders for Inpatient glycemic control: Levemir 10 units QD + Novolog 0-15 units TID with meals + 0-5 units QHS  Inpatient Diabetes Program Recommendations:  Note that patient appears sensitive to insulin doses and omission.   Please consider changing Levemir to 5 units bid.  Also consider reducing Novolog correction to Very sensitive starting at 151 mg/dL.  Add Novolog meal coverage 2 units tid with meals.   Thanks,  Adah Perl, RN, BC-ADM Inpatient Diabetes Coordinator Pager [phone] (8a-5p)

## 2018-12-13 NOTE — Progress Notes (Signed)
ANTICOAGULATION CONSULT NOTE   Pharmacy Consult for Heparin  Indication: DVT  No Known Allergies  Patient Measurements: Height: 6\' 3"  (190.5 cm) Weight: 110 lb 3.7 oz (50 kg) IBW/kg (Calculated) : 84.5 Heparin Dosing Weight: 60.8 kg  Vital Signs: Temp: 97.9 F (36.6 C) (12/03 0829) Temp Source: Oral (12/03 0829) BP: 112/77 (12/03 0800) Pulse Rate: 92 (12/03 0800)  Labs: Recent Labs    12/10/18 0930  12/10/18 2336 12/11/18 0455  12/12/18 0404 12/12/18 1835 12/13/18 0430  HGB  --    < > 8.4*  --   --  9.4*  --  9.2*  HCT  --   --  23.6*  --   --  26.9*  --  27.4*  PLT  --   --  249  --   --  161  --  173  HEPARINUNFRC  --    < > 0.31 0.32  --  0.36  --  0.22*  CREATININE  --    < > 9.20*  --    < > 8.15* 7.06* 6.87*  TROPONINIHS 54*  --   --   --   --   --   --   --    < > = values in this interval not displayed.    Estimated Creatinine Clearance: 8.4 mL/min (A) (by C-G formula based on SCr of 6.87 mg/dL (H)).  Assessment: Patient is a 58 year old male that was recently admitted at the end of last month, diagnosed with DVT and discharged on Xarelto. Heparin was started per pharmacy dosing due to inability to take PO medications. Full liquid diet started 12/1.  Heparin level now subtherapeutic at 0.22. No bleeding or issues with infusion per discussion with RN. Hg low but stable, plt wnl.  Goal of Therapy:  Heparin level 0.3-0.7 units/ml Monitor platelets by anticoagulation protocol: Yes   Plan:  Increase heparin to 1300 units/hr 8hr heparin level Monitor daily heparin level and CBC, s/sx bleeding   Elicia Lamp, PharmD, BCPS Please check AMION for all [place] contact numbers Clinical Pharmacist 12/13/2018 8:54 AM

## 2018-12-13 NOTE — Progress Notes (Signed)
Patient ID: Timothy Madden, male   DOB: 1960-02-08, 58 y.o.   MRN: [id number] S: No new complaints and is more awake and alert today O:BP 110/65 (BP Location: Right Arm)   Pulse 97   Temp 98.3 F (36.8 C) (Oral)   Resp 19   Ht 6\' 3"  (1.905 m)   Wt 50 kg   SpO2 98%   BMI 13.78 kg/m   Intake/Output Summary (Last 24 hours) at 12/13/2018 1258 Last data filed at 12/13/2018 1230 Gross per 24 hour  Intake 3649.1 ml  Output 3700 ml  Net -50.9 ml   Intake/Output: I/O last 3 completed shifts: In: 5249.5 [P.O.:440; I.V.:4304.7; IV Piggyback:504.8] Out: 5090 [Urine:5090]  Intake/Output this shift:  Total I/O In: 623.9 [P.O.:120; I.V.:403.9; IV Piggyback:100] Out: 750 [Urine:750] Weight change:  Gen: cachectic, chronically ill-appearing AAM in NAD CVS: no rub Resp: occ rhonchi Abd: +BS, soft, NT/ND Ext: no edema  Recent Labs  Lab 12/08/18 1201  12/10/18 0556 12/10/18 1445 12/10/18 2118 12/10/18 2336 12/11/18 0915 12/12/18 0404 12/12/18 1835 12/13/18 0430  NA 120*   < > 143 144 142 143 142 146* 139 139  K 7.2*   < > 3.0* 2.9* 2.8* 2.8* 2.9* 2.4* 2.7* 2.9*  CL 85*   < > 112* 109 105 104 99 91* 86* 87*  CO2 10*   < > 16* 19* 22 21* 26 35* 37* 35*  GLUCOSE 1,681*   < > 196* 158* 120* 111* 160* 113* 221* 108*  BUN 182*   < > 141* 128* 135* 135* 128* 116* 101* 96*  CREATININE 12.91*   < > 9.87* 8.98* 9.56* 9.20* 8.97* 8.15* 7.06* 6.87*  ALBUMIN 2.5*  --  1.4*  --   --   --  1.5* 1.6*  --  1.6*  CALCIUM 9.1   < > 7.0* 7.0* 7.1* 7.1* 6.9* 6.9* 6.6* 6.4*  PHOS  --   --  4.1  --   --  4.2 3.4 4.1  --  3.6  AST 23  --   --   --   --   --   --   --   --   --   ALT 14  --   --   --   --   --   --   --   --   --    < > = values in this interval not displayed.   Liver Function Tests: Recent Labs  Lab 12/08/18 1201  12/11/18 0915 12/12/18 0404 12/13/18 0430  AST 23  --   --   --   --   ALT 14  --   --   --   --   ALKPHOS 101  --   --   --   --   BILITOT 0.9  --   --   --   --    PROT 9.4*  --   --   --   --   ALBUMIN 2.5*   < > 1.5* 1.6* 1.6*   < > = values in this interval not displayed.   No results for input(s): LIPASE, AMYLASE in the last 168 hours. No results for input(s): AMMONIA in the last 168 hours. CBC: Recent Labs  Lab 12/08/18 1201  12/10/18 2336 12/12/18 0404 12/13/18 0430  WBC 15.7*  --  20.2* 22.4* 24.8*  NEUTROABS 14.2*  --   --   --   --   HGB 12.3*   < >  8.4* 9.4* 9.2*  HCT 42.8   < > 23.6* 26.9* 27.4*  MCV 89.9  --  73.1* 74.1* 76.5*  PLT 573*  --  249 161 173   < > = values in this interval not displayed.   Cardiac Enzymes: No results for input(s): CKTOTAL, CKMB, CKMBINDEX, TROPONINI in the last 168 hours. CBG: Recent Labs  Lab 12/12/18 1221 12/12/18 1820 12/12/18 2222 12/13/18 0830 12/13/18 1215  GLUCAP 340* 207* 89 122* 290*    Iron Studies:  Recent Labs    12/13/18 0430  IRON 18*  TIBC 125*  FERRITIN 1,260*   Studies/Results: No results found. . chlorhexidine  15 mL Mouth Rinse BID  . Chlorhexidine Gluconate Cloth  6 each Topical Q0600  . feeding supplement (ENSURE ENLIVE)  237 mL Oral TID BM  . insulin aspart  0-15 Units Subcutaneous TID WC  . insulin aspart  0-5 Units Subcutaneous QHS  . insulin detemir  10 Units Subcutaneous Q24H  . mouth rinse  15 mL Mouth Rinse q12n4p  . nitroGLYCERIN  1 inch Topical Q6H  . pantoprazole  40 mg Oral QHS    BMET    Component Value Date/Time   NA 139 12/13/2018 0430   K 2.9 (L) 12/13/2018 0430   CL 87 (L) 12/13/2018 0430   CO2 35 (H) 12/13/2018 0430   GLUCOSE 108 (H) 12/13/2018 0430   BUN 96 (H) 12/13/2018 0430   CREATININE 6.87 (H) 12/13/2018 0430   CREATININE 1.95 (H) 11/11/2013 1212   CALCIUM 6.4 (LL) 12/13/2018 0430   GFRNONAA 8 (L) 12/13/2018 0430   GFRNONAA 38 (L) 11/11/2013 1212   GFRAA 9 (L) 12/13/2018 0430   GFRAA 44 (L) 11/11/2013 1212   CBC    Component Value Date/Time   WBC 24.8 (H) 12/13/2018 0430   RBC 3.58 (L) 12/13/2018 0430   HGB 9.2 (L)  12/13/2018 0430   HCT 27.4 (L) 12/13/2018 0430   PLT 173 12/13/2018 0430   MCV 76.5 (L) 12/13/2018 0430   MCH 25.7 (L) 12/13/2018 0430   MCHC 33.6 12/13/2018 0430   RDW 14.9 12/13/2018 0430   LYMPHSABS 0.5 (L) 12/08/2018 1201   MONOABS 0.7 12/08/2018 1201   EOSABS 0.0 12/08/2018 1201   BASOSABS 0.1 12/08/2018 1201   Assessment/Plan:  1. AKI/CKD stage 4- in setting of DKA and persistent obstructive uropathy. Improving with IVF's and insulin, however BUN/Cr remain elevated. Renal US reveals persistent left >right hydronephrosis despite foley catheter. 1. BUN/Cr continues to slowly improve but hydro remains and BUN/Cr remain above baseline. 2. Obstructive uropathy- discussed case with Dr. Louis Meckel who has reviewed the CT scan.  Will continue to watch for another 24 hours and if no significant improvement, he has agreed to proceed with bilateral stent placement on 12/14/18.  Appreciate his input and care. 3. DKA/HONK- on insulin per primary svc 4. Sepsis from UTI/serratia bacteremia- on ceftriaxone per primary team 5. AMS- improving with improved glucose control 6. Metabolic acidosis due to #1- resolved with bicarb drip.  Will stop bicarb and change to 1/2 NS  7. Hypernatremia- will change to hypotonic fluid as he is not able to keep up with po water intake.  1. Free water deficit is 1.3liters 2. Improved and will decrease rate of IVFs 8. DVT- on IV heparin 9. HTN- new onsetbut presently resolved. Possibly related to ICU on prn labetalol. 10. Hypokalemia- replete and follow 11. Anemia of CKD- check iron stores and may need ESA. 12. Severe protein malnutrition- supplements per  primary svc. Recommend nepro given advanced CKD at baseline 13. Psychiatric disorder- h/o noncompliance and poor self care 14. Disposition- will likely require placement when stable.   Donetta Potts, MD [employer] [phone]

## 2018-12-13 NOTE — Progress Notes (Signed)
CRITICAL VALUE ALERT  Critical Value:  Calcium 6.4, k 2.7  Date & Time Notied:  12/13/2018   Provider Notified: [facility]  Orders Received/Actions taken: allow primary team to see pt and evaluate during rounds

## 2018-12-14 LAB — CBC WITH DIFFERENTIAL/PLATELET
Abs Immature Granulocytes: 0.51 10*3/uL — ABNORMAL HIGH (ref 0.00–0.07)
Basophils Absolute: 0.1 10*3/uL (ref 0.0–0.1)
Basophils Relative: 0 %
Eosinophils Absolute: 0.1 10*3/uL (ref 0.0–0.5)
Eosinophils Relative: 1 %
HCT: 26.8 % — ABNORMAL LOW (ref 39.0–52.0)
Hemoglobin: 8.7 g/dL — ABNORMAL LOW (ref 13.0–17.0)
Immature Granulocytes: 3 %
Lymphocytes Relative: 8 %
Lymphs Abs: 1.5 10*3/uL (ref 0.7–4.0)
MCH: 25.5 pg — ABNORMAL LOW (ref 26.0–34.0)
MCHC: 32.5 g/dL (ref 30.0–36.0)
MCV: 78.6 fL — ABNORMAL LOW (ref 80.0–100.0)
Monocytes Absolute: 0.9 10*3/uL (ref 0.1–1.0)
Monocytes Relative: 5 %
Neutro Abs: 16.5 10*3/uL — ABNORMAL HIGH (ref 1.7–7.7)
Neutrophils Relative %: 83 %
Platelets: 198 10*3/uL (ref 150–400)
RBC: 3.41 MIL/uL — ABNORMAL LOW (ref 4.22–5.81)
RDW: 15.1 % (ref 11.5–15.5)
WBC: 19.5 10*3/uL — ABNORMAL HIGH (ref 4.0–10.5)
nRBC: 0 % (ref 0.0–0.2)

## 2018-12-14 LAB — RENAL FUNCTION PANEL
Albumin: 1.5 g/dL — ABNORMAL LOW (ref 3.5–5.0)
Anion gap: 14 (ref 5–15)
BUN: 92 mg/dL — ABNORMAL HIGH (ref 6–20)
CO2: 32 mmol/L (ref 22–32)
Calcium: 6.7 mg/dL — ABNORMAL LOW (ref 8.9–10.3)
Chloride: 91 mmol/L — ABNORMAL LOW (ref 98–111)
Creatinine, Ser: 6 mg/dL — ABNORMAL HIGH (ref 0.61–1.24)
GFR calc Af Amer: 11 mL/min — ABNORMAL LOW (ref >60)
GFR calc non Af Amer: 10 mL/min — ABNORMAL LOW (ref >60)
Glucose, Bld: 219 mg/dL — ABNORMAL HIGH (ref 70–99)
Phosphorus: 1.8 mg/dL — ABNORMAL LOW (ref 2.5–4.6)
Potassium: 4.1 mmol/L (ref 3.5–5.1)
Sodium: 137 mmol/L (ref 135–145)

## 2018-12-14 LAB — GLUCOSE, CAPILLARY
Glucose-Capillary: 145 mg/dL — ABNORMAL HIGH (ref 70–99)
Glucose-Capillary: 296 mg/dL — ABNORMAL HIGH (ref 70–99)
Glucose-Capillary: 258 mg/dL — ABNORMAL HIGH (ref 70–99)
Glucose-Capillary: 324 mg/dL — ABNORMAL HIGH (ref 70–99)
Glucose-Capillary: 218 mg/dL — ABNORMAL HIGH (ref 70–99)

## 2018-12-14 LAB — HEPARIN LEVEL (UNFRACTIONATED)
Heparin Unfractionated: 0.16 IU/mL — ABNORMAL LOW (ref 0.30–0.70)
Heparin Unfractionated: 0.31 IU/mL (ref 0.30–0.70)

## 2018-12-14 LAB — BASIC METABOLIC PANEL
Anion gap: 16 — ABNORMAL HIGH (ref 5–15)
BUN: 101 mg/dL — ABNORMAL HIGH (ref 6–20)
CO2: 37 mmol/L — ABNORMAL HIGH (ref 22–32)
Calcium: 6.6 mg/dL — ABNORMAL LOW (ref 8.9–10.3)
Chloride: 86 mmol/L — ABNORMAL LOW (ref 98–111)
Creatinine, Ser: 7.06 mg/dL — ABNORMAL HIGH (ref 0.61–1.24)
GFR calc Af Amer: 9 mL/min — ABNORMAL LOW (ref >60)
GFR calc non Af Amer: 8 mL/min — ABNORMAL LOW (ref >60)
Glucose, Bld: 221 mg/dL — ABNORMAL HIGH (ref 70–99)
Potassium: 2.7 mmol/L — CL (ref 3.5–5.1)
Sodium: 139 mmol/L (ref 135–145)

## 2018-12-14 LAB — MAGNESIUM: Magnesium: 1.6 mg/dL — ABNORMAL LOW (ref 1.7–2.4)

## 2018-12-14 MED ORDER — MAGNESIUM SULFATE IN D5W 1-5 GM/100ML-% IV SOLN
1.0000 g | Freq: Once | INTRAVENOUS | Status: AC
  Administered 2018-12-14: 09:00:00 1 g via INTRAVENOUS
  Filled 2018-12-14: qty 100

## 2018-12-14 MED ORDER — METOPROLOL SUCCINATE ER 25 MG PO TB24
12.5000 mg | ORAL_TABLET | Freq: Every day | ORAL | Status: DC
  Administered 2018-12-14 – 2018-12-19 (×6): 12.5 mg via ORAL
  Filled 2018-12-14 (×7): qty 1

## 2018-12-14 MED ORDER — SODIUM CHLORIDE 0.45 % IV SOLN
INTRAVENOUS | Status: DC
  Administered 2018-12-14 – 2018-12-17 (×5): via INTRAVENOUS

## 2018-12-14 NOTE — Progress Notes (Signed)
  Speech Language Pathology Treatment: Dysphagia  Patient Details Name: Timothy Madden MRN: [id number] DOB: [birth year] Today's Date: 12/14/2018 Time: 0211-1552 SLP Time Calculation (min) (ACUTE ONLY): 10 min  Assessment / Plan / Recommendation Clinical Impression  Pt alert, persisting delays in response time and initiation, but demonstrated improved attention to and control of POs, with no further spillage/pocketing.  Improved masticatory effort with only initial verba cues needed for oral attention.  No s/s of aspiration or a pharyngeal dysphagia.  Respiratory/swallowing synchrony appears adequate.  Advance diet to dysphagia 2, thin liquids.  D/W RN.  HPI HPI: 58 yo male admitted with AMS r/t severe DKA/HHS. PMH includes poorly controlled DM, severe PCM, gastroparesis, numerous psychiatric issues.       SLP Plan  Continue with current plan of care       Recommendations  Diet recommendations: Dysphagia 2 (fine chop);Thin liquid Liquids provided via: Cup;Straw Medication Administration: Whole meds with puree Supervision: Patient able to self feed;Intermittent supervision to cue for compensatory strategies Compensations: Minimize environmental distractions Postural Changes and/or Swallow Maneuvers: Seated upright 90 degrees;Upright 30-60 min after meal                Oral Care Recommendations: Oral care BID Follow up Recommendations: 24 hour supervision/assistance SLP Visit Diagnosis: Dysphagia, oral phase (R13.11) Plan: Continue with current plan of care       GO               Elexus Barman L. Tivis Ringer, [place] CCC/SLP Acute Rehabilitation Services Office number [phone] Pager [phone]  Juan Quam Laurice 12/14/2018, 8:50 AM

## 2018-12-14 NOTE — Progress Notes (Signed)
Hospitalist progress note + chart summary  Timothy Madden [id number] DOB: [birth year] DOA: 12/08/2018  PCP: Marliss Coots, NP   Narrative:  58 year old male DM TY 2 CKD 3 (2.5-2.7 creatinine) previous osteomyelitis recent DVT, psychiatric issues?  Homeless hypoechoic mass kidneys admit ICU 1128 obtunded in severe DKA-developed severe AKI creatinine 12 potassium 7-urology and nephrology saw-recommended Foley to gravity Rx urinary tract infection  Data Reviewed:  Potassium 2.9 BUN/creatinine 96/6.835 (on admission 182/12.9) magnesium 1.4 albumin 1.6 calcium 6.4 Iron 18 TIBC 125 White count 24 up from 22 globin 9.2 platelets 173 Assessment & Plan: DKA uncontrolled diabetes mellitus 15-DKA resolved-sugars-now 145-293 stop supplemental feeds, continue Levemir 10 and moderate sliding scale coverage  HTN, sinus tach-yransition to metoprolol XL 12.5 and DC the as needed meds   AKI, hypokalemia (now resolved), hypocalcemia + hypomagnesemia,-now in polyuric phase, still +4 L but putting out good urine-defer to nephrology-given magnesium 1 g today  Diarrhea-likely from supplements as it has decreased greatly since we have gone to once daily supplements-we will temporarily discontinue them and reevaluate as needed  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-urology did ultrasound kidneys 12/3 repeat 12/4 recommend before decision renephrostomy-unclear how well patient can take care of the same or please see understanding-we will discuss with mother  Serratia UTI-Rocephin stop date 12/4  DVT 10/31-continue IV heparin for now transition when further clarity regarding whether further procedures to be done-transition to Eliquis when able  Anemia of renal disease-iron levels are somewhat low TIBC is low but in the setting of infection may choose to hold on further replacement  Psychiatric disorder-reported in chart but no meds and also reported he was homeless will need to inquire further on  Severe protein energy  malnutrition BMI 13-supplement as needed-have cut back to once a day and stopped reglan--encourage diet as patient has probable diarr from supplements  -transition to MedSurg-expect will be here several days needs skilled facility placement    CHART REVIEW . 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  Leading to possible dysfunctional ureterovesicular junction . Admission for DKA 6/18 through 6/22-at that time found to have hypoechoic mass . Admission 10/16 through 11/02/2013 osteomyelitis left finger with amputation at that time  Subjective: More coherent able to tell me where he is No fever no chills-cannot understand what is going on with him and does not understand anything about the procedure to be done on kidneys  Consultants:   uro  nephro Procedures:   n Antimicrobials:   Ceftriaxone till 12/4   Objective: Vitals:   12/14/18 0800 12/14/18 0900 12/14/18 1000 12/14/18 1100  BP: 128/74 114/70 92/61   Pulse: (!) 102     Resp: (!) 22 19 18    Temp:    99 F (37.2 C)  TempSrc:    Oral  SpO2: 97%     Weight:      Height:        Intake/Output Summary (Last 24 hours) at 12/14/2018 1255 Last data filed at 12/14/2018 1100 Gross per 24 hour  Intake 3353.37 ml  Output 4450 ml  Net -1096.63 ml   Filed Weights   12/08/18 1200 12/08/18 2050 12/09/18 0300  Weight: 86.9 kg 60.8 kg 50 kg    Examination: Emaciated black male no distress EOMI NCAT Chest clear Abdomen soft no rebound no guarding Foley in place, Flexi-Seal in place   Scheduled Meds: . chlorhexidine  15  mL Mouth Rinse BID  . Chlorhexidine Gluconate Cloth  6 each Topical Q0600  . feeding supplement (ENSURE ENLIVE)  237 mL Oral Q24H  . insulin aspart  0-15 Units Subcutaneous TID WC  . insulin aspart  0-5 Units Subcutaneous QHS  . insulin detemir  10 Units  Subcutaneous Q24H  . mouth rinse  15 mL Mouth Rinse q12n4p  . nitroGLYCERIN  1 inch Topical Q6H  . pantoprazole  40 mg Oral QHS   Continuous Infusions: . sodium chloride 50 mL/hr at 12/14/18 1100  . sodium chloride 10 mL/hr at 12/14/18 1100  . cefTRIAXone (ROCEPHIN)  IV Stopped (12/14/18 0902)  . heparin 1,450 Units/hr (12/14/18 1100)     LOS: 6 days   Time spent: [place], MD [facility]  12/14/2018, 12:55 PM

## 2018-12-14 NOTE — Progress Notes (Signed)
Patient ID: Timothy Madden, male   DOB: 1960/06/09, 58 y.o.   MRN: [id number] [facility] Progress Note   Assessment/ Plan:   1. Acute kidney Injury on chronic kidney disease stage IV: Suspected to be bifactorial from hemodynamic shifts of DKA and obstructive uropathy with follow-up ultrasound showing persistent left greater than right hydronephrosis in spite of alleviating bladder outlet obstruction with Foley catheter.  Plans noted by Dr. Alyson Ingles (urology) for repeat imaging today and consideration of bilateral ureteral stents if obstruction persists.  Creatinine continues to trend down with polyuric urine output.  No acute electrolyte abnormalities/indications for dialysis at this time.  Baseline creatinine suspected to be around 2.5. 2.  Diabetic ketoacidosis: Improving post ketotic alkalemia with some renal recovery/improving urine output.  Ongoing glycemic control per primary service, social problems likely to impair chronic management. 3.  Severe sepsis secondary to Serratia urinary tract infection: On Rocephin-last dose today.  Hemodynamically stable. 4.  Right leg deep venous thrombosis: On intravenous heparin at this time with plan to transition to oral anticoagulant following definitive procedure to alleviate urinary tract obstruction. 5.  History of psychiatric disorder: Complicated by difficult social situation/homeless status. 6.  Anemia: With evidence of iron deficiency (low iron saturation of 14%) but a prohibitively high ferritin of 1260 that currently precludes intravenous iron therapy.  No overt loss.  Etiology likely from critical illness in the setting of CKD.  Subjective:   Reports discomfort from Foley catheter site, denies any chest pain or shortness of breath.  Intermittent right leg pain.   Objective:   BP 105/67   Pulse 99   Temp 98.7 F (37.1 C) (Oral)   Resp 17   Ht 6\' 3"  (1.905 m)   Wt 50 kg   SpO2 99%   BMI 13.78 kg/m   Intake/Output Summary  (Last 24 hours) at 12/14/2018 1194 Last data filed at 12/14/2018 0600 Gross per 24 hour  Intake 3414.13 ml  Output 4480 ml  Net -1065.87 ml   Weight change:   Physical Exam: Gen: Comfortably resting in bed, watching television CVS: Pulse regular rhythm, S1 and S2 normal Resp: Anteriorly clear to auscultation, no rales/rhonchi Abd: Soft, flat, nontender Ext: Trace-1+ bilateral ankle edema  Imaging: US Renal  Result Date: 12/13/2018 CLINICAL DATA:  Hydronephrosis EXAM: RENAL / URINARY TRACT ULTRASOUND COMPLETE COMPARISON:  Renal ultrasound 12/10/2018 FINDINGS: Right Kidney: Renal measurements: 10.8 x 5.9 x 6.4 cm = volume: 214 mL. Mild to moderate right hydronephrosis, increased since prior study. Mildly increased echotexture. Left Kidney: Renal measurements: 12.1 x 5.7 x 4.7 cm = volume: 172 mL. Moderate left hydronephrosis, similar to prior study. Mildly increased echotexture. Bladder: Bladder is decompressed with Foley catheter in place. Other: None. IMPRESSION: Bilateral hydronephrosis, stable on the left, slightly worsened on the right since prior study. Electronically Signed   By: Rolm Baptise M.D.   On: 12/13/2018 19:10    Labs: BMET Recent Labs  Lab 12/10/18 0556  12/10/18 2118 12/10/18 2336 12/11/18 0915 12/12/18 0404 12/12/18 1835 12/13/18 0430 12/14/18 0148  NA 143   < > 142 143 142 146* 139 139 137  K 3.0*   < > 2.8* 2.8* 2.9* 2.4* 2.7* 2.9* 4.1  CL 112*   < > 105 104 99 91* 86* 87* 91*  CO2 16*   < > 22 21* 26 35* 37* 35* 32  GLUCOSE 196*   < > 120* 111* 160* 113* 221* 108* 219*  BUN 141*   < >  135* 135* 128* 116* 101* 96* 92*  CREATININE 9.87*   < > 9.56* 9.20* 8.97* 8.15* 7.06* 6.87* 6.00*  CALCIUM 7.0*   < > 7.1* 7.1* 6.9* 6.9* 6.6* 6.4* 6.7*  PHOS 4.1  --   --  4.2 3.4 4.1  --  3.6 1.8*   < > = values in this interval not displayed.   CBC Recent Labs  Lab 12/08/18 1201  12/10/18 2336 12/12/18 0404 12/13/18 0430 12/14/18 0148  WBC 15.7*  --  20.2*  22.4* 24.8* 19.5*  NEUTROABS 14.2*  --   --   --   --  16.5*  HGB 12.3*   < > 8.4* 9.4* 9.2* 8.7*  HCT 42.8   < > 23.6* 26.9* 27.4* 26.8*  MCV 89.9  --  73.1* 74.1* 76.5* 78.6*  PLT 573*  --  249 161 173 198   < > = values in this interval not displayed.    Medications:    . chlorhexidine  15 mL Mouth Rinse BID  . Chlorhexidine Gluconate Cloth  6 each Topical Q0600  . feeding supplement (ENSURE ENLIVE)  237 mL Oral Q24H  . insulin aspart  0-15 Units Subcutaneous TID WC  . insulin aspart  0-5 Units Subcutaneous QHS  . insulin detemir  10 Units Subcutaneous Q24H  . mouth rinse  15 mL Mouth Rinse q12n4p  . nitroGLYCERIN  1 inch Topical Q6H  . pantoprazole  40 mg Oral QHS   Elmarie Shiley, MD 12/14/2018, 7:13 AM

## 2018-12-14 NOTE — Progress Notes (Signed)
Inpatient Diabetes Program Recommendations  AACE/ADA: New Consensus Statement on Inpatient Glycemic Control (2015)  Target Ranges:  Prepandial:   less than 140 mg/dL      Peak postprandial:   less than 180 mg/dL (1-2 hours)      Critically ill patients:  140 - 180 mg/dL   Lab Results  Component Value Date   GLUCAP 145 (H) 12/14/2018   HGBA1C 14.3 (H) 11/10/2018    Review of Glycemic Control Results for Timothy Madden, Timothy Madden (MRN [id number]) as of 12/14/2018 10:59  Ref. Range 12/13/2018 08:30 12/13/2018 12:15 12/13/2018 15:45 12/13/2018 17:57 12/13/2018 23:20 12/14/2018 07:33  Glucose-Capillary Latest Ref Range: 70 - 99 mg/dL 122 (H) 290 (H) 289 (H) 340 (H) 226 (H) 145 (H)  Diabetes history:DM2 Outpatient Diabetes medications:Levemir 30 units QD + Novolog 5 units TID with meals Current orders for Inpatient glycemic control:Levemir 10 units QD + Novolog 0-15 units TID with meals + 0-5 units QHS  Inpatient Diabetes Program Recommendations:  Note that patient appears sensitive to insulin doses and omission.   Please consider changing Levemir to 5 units bid.  Also consider reducing Novolog correction to Very sensitive starting at 151 mg/dL.  Add Novolog meal coverage 2 units tid with meals.   Thanks,  Adah Perl, RN, BC-ADM Inpatient Diabetes Coordinator Pager [phone] (8a-5p)

## 2018-12-14 NOTE — Progress Notes (Signed)
ANTICOAGULATION CONSULT NOTE   Pharmacy Consult for Heparin  Indication: DVT  No Known Allergies  Patient Measurements: Height: 6\' 3"  (190.5 cm) Weight: 110 lb 3.7 oz (50 kg) IBW/kg (Calculated) : 84.5 Heparin Dosing Weight: 60.8 kg  Vital Signs: Temp: 98 F (36.7 C) (12/03 2329) Temp Source: Oral (12/03 2329) BP: 105/70 (12/03 2300) Pulse Rate: 98 (12/03 2300)  Labs: Recent Labs    12/12/18 0404 12/12/18 1835 12/13/18 0430 12/13/18 1838 12/14/18 0148  HGB 9.4*  --  9.2*  --  8.7*  HCT 26.9*  --  27.4*  --  26.8*  PLT 161  --  173  --  198  HEPARINUNFRC 0.36  --  0.22* 0.22* 0.16*  CREATININE 8.15* 7.06* 6.87*  --  6.00*    Estimated Creatinine Clearance: 9.6 mL/min (A) (by C-G formula based on SCr of 6 mg/dL (H)).  Assessment: Patient is a 58 year old male that was recently admitted at the end of last month, diagnosed with DVT and discharged on Xarelto. Heparin was started per pharmacy dosing due to inability to take PO medications. Full liquid diet started 12/1.  12/4 AM update: Heparin level low No issues per RN  Goal of Therapy:  Heparin level 0.3-0.7 units/ml Monitor platelets by anticoagulation protocol: Yes   Plan:  Increase heparin to 1450 units/hr 8hr heparin level  Narda Bonds, PharmD, BCPS Clinical Pharmacist Phone: [phone]

## 2018-12-14 NOTE — Progress Notes (Signed)
ANTICOAGULATION CONSULT NOTE   Pharmacy Consult for Heparin  Indication: DVT  No Known Allergies  Patient Measurements: Height: 6\' 3"  (190.5 cm) Weight: 110 lb 3.7 oz (50 kg) IBW/kg (Calculated) : 84.5 Heparin Dosing Weight: 50 kg  Vital Signs: Temp: 99 F (37.2 C) (12/04 1100) Temp Source: Oral (12/04 1100) BP: 92/61 (12/04 1000) Pulse Rate: 102 (12/04 0800)  Labs: Recent Labs    12/12/18 0404 12/12/18 1835 12/13/18 0430 12/13/18 1838 12/14/18 0148 12/14/18 1123  HGB 9.4*  --  9.2*  --  8.7*  --   HCT 26.9*  --  27.4*  --  26.8*  --   PLT 161  --  173  --  198  --   HEPARINUNFRC 0.36  --  0.22* 0.22* 0.16* 0.31  CREATININE 8.15* 7.06* 6.87*  --  6.00*  --     Estimated Creatinine Clearance: 9.6 mL/min (A) (by C-G formula based on SCr of 6 mg/dL (H)).  Assessment: Patient is a 58 year old male that was recently admitted at the end of last month, diagnosed with DVT and discharged on Xarelto. Noted AKI - SCr 12.91 on admit, trending down to 6 today. Heparin was started per pharmacy dosing.  Heparin level now therapeutic at 0.31 but near bottom of range.Hg low but stable, plt wnl. No bleeding or issues with infusion per discussion with RN.  Goal of Therapy:  Heparin level 0.3-0.7 units/ml Monitor platelets by anticoagulation protocol: Yes   Plan:  Increase heparin slight to 1500 units/hr to ensure stays in range Monitor daily heparin level and CBC, s/sx bleeding   Elicia Lamp, PharmD, BCPS Please check AMION for all [place] contact numbers Clinical Pharmacist 12/14/2018 12:59 PM

## 2018-12-14 NOTE — Progress Notes (Signed)
I called number in chart for more than 3 times and did not receive a response I left a message on voicemail

## 2018-12-15 ENCOUNTER — Inpatient Hospital Stay (HOSPITAL_COMMUNITY): Payer: Medicaid Other

## 2018-12-15 DIAGNOSIS — L8990 Pressure ulcer of unspecified site, unspecified stage: Secondary | ICD-10-CM | POA: Insufficient documentation

## 2018-12-15 LAB — GLUCOSE, CAPILLARY
Glucose-Capillary: 169 mg/dL — ABNORMAL HIGH (ref 70–99)
Glucose-Capillary: 455 mg/dL — ABNORMAL HIGH (ref 70–99)
Glucose-Capillary: 474 mg/dL — ABNORMAL HIGH (ref 70–99)
Glucose-Capillary: 438 mg/dL — ABNORMAL HIGH (ref 70–99)
Glucose-Capillary: 330 mg/dL — ABNORMAL HIGH (ref 70–99)
Glucose-Capillary: 237 mg/dL — ABNORMAL HIGH (ref 70–99)

## 2018-12-15 LAB — HEPARIN LEVEL (UNFRACTIONATED): Heparin Unfractionated: 0.4 IU/mL (ref 0.30–0.70)

## 2018-12-15 LAB — RENAL FUNCTION PANEL
Albumin: 1.7 g/dL — ABNORMAL LOW (ref 3.5–5.0)
Anion gap: 13 (ref 5–15)
BUN: 81 mg/dL — ABNORMAL HIGH (ref 6–20)
CO2: 29 mmol/L (ref 22–32)
Calcium: 7.5 mg/dL — ABNORMAL LOW (ref 8.9–10.3)
Chloride: 94 mmol/L — ABNORMAL LOW (ref 98–111)
Creatinine, Ser: 5.49 mg/dL — ABNORMAL HIGH (ref 0.61–1.24)
GFR calc Af Amer: 12 mL/min — ABNORMAL LOW (ref >60)
GFR calc non Af Amer: 11 mL/min — ABNORMAL LOW (ref >60)
Glucose, Bld: 144 mg/dL — ABNORMAL HIGH (ref 70–99)
Phosphorus: 2.7 mg/dL (ref 2.5–4.6)
Potassium: 4 mmol/L (ref 3.5–5.1)
Sodium: 136 mmol/L (ref 135–145)

## 2018-12-15 LAB — CBC
HCT: 28 % — ABNORMAL LOW (ref 39.0–52.0)
Hemoglobin: 8.9 g/dL — ABNORMAL LOW (ref 13.0–17.0)
MCH: 25.4 pg — ABNORMAL LOW (ref 26.0–34.0)
MCHC: 31.8 g/dL (ref 30.0–36.0)
MCV: 79.8 fL — ABNORMAL LOW (ref 80.0–100.0)
Platelets: 244 10*3/uL (ref 150–400)
RBC: 3.51 MIL/uL — ABNORMAL LOW (ref 4.22–5.81)
RDW: 15.3 % (ref 11.5–15.5)
WBC: 21 10*3/uL — ABNORMAL HIGH (ref 4.0–10.5)
nRBC: 0 % (ref 0.0–0.2)

## 2018-12-15 NOTE — Progress Notes (Signed)
At 1700 cbg run high 400's. Md made ware, 15 units given per MD instruction. Recheck cbg at this time, BS slowly going down. Will continue to monitor.

## 2018-12-15 NOTE — Progress Notes (Addendum)
Hospitalist progress note + chart summary  AUSTIN HERD [id number] DOB: 17-Sep-1960 DOA: 12/08/2018  PCP: Marliss Coots, NP   Narrative:  58 year old male DM TY 2 CKD 3 (2.5-2.7 creatinine) previous osteomyelitis recent DVT, psychiatric issues?  Homeless hypoechoic mass kidneys admit ICU 1128 obtunded in severe DKA-developed severe AKI creatinine 12 potassium 7-urology and nephrology saw-recommended Foley to gravity Rx urinary tract infection  Data Reviewed:  Potassium 2.9-->4.0  BUN/creatinine 81/5.4 (on admission 182/12.9) albumin 1.7 calcium 7.5 Iron 18 TIBC 125 White count 21 Assessment & Plan: DKA uncontrolled diabetes mellitus 15-DKA resolved-CBG 144-169 stop supplemental feeds, continue Levemir 10 and moderate sliding scale coverage  HTN, sinus tach-transition to metoprolol XL 12.5  Serratia UTI-Rocephin stop date 12/4 Leukocytosis-unclear etiology--Rx finished for above--no fever--OP work up if persistent   AKI, hypokalemia (now resolved), hypocalcemia + hypomagnesemia,-now in polyuric phase,  -3.1 L   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-urology did ultrasound kidneys 12/3 repeat 12/4 and 12/5--d/w Dr. Claudia Desanctis of Urology-no overt need for Nephrostomies-likely can d/c with Foley  Diarrhea-likely from supplements as it has decreased greatly since we have gone to once daily supplements-no more supplements--give extra diet  DVT 10/31-continue IV heparin-transition to Eliquis when Urologist finalizes poc  Anemia of renal disease-iron levels are somewhat low TIBC is low but in the setting of infection may choose to hold on further replacement   Psychiatric disorder-reported in chart but no meds and also reported he was homeless-tried to call mother and no response  Severe protein energy malnutrition BMI 13-supplement as needed-have cut back to once a day and stopped reglan--encourage diet as patient has probable diarr from supplements  -transition to MedSurg-expect will be here several  days needs skilled facility placement  No family avail  CHART REVIEW . 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  Leading to possible dysfunctional ureterovesicular junction . Admission for DKA 6/18 through 6/22-at that time found to have hypoechoic mass . Admission 10/16 through 11/02/2013 osteomyelitis left finger with amputation at that time  Subjective: Coherent in nad sitting-eating and drinking  Consultants:   uro  nephro Procedures:   n Antimicrobials:   Ceftriaxone till 12/4   Objective: Vitals:   12/15/18 1100 12/15/18 1200 12/15/18 1300 12/15/18 1400  BP: 111/72 (!) 105/57 126/76 107/67  Pulse: 99     Resp: 20 19 17  (!) 26  Temp:      TempSrc:      SpO2: 95%     Weight:      Height:        Intake/Output Summary (Last 24 hours) at 12/15/2018 1454 Last data filed at 12/15/2018 1400 Gross per 24 hour  Intake 1779.24 ml  Output 4097 ml  Net -2317.76 ml   Filed Weights   12/08/18 1200 12/08/18 2050 12/09/18 0300  Weight: 86.9 kg 60.8 kg 50 kg    Examination:  Emaciated in na D s1 s 2no m Foley in place abd soft nt nd  Neuro intact   Scheduled Meds: . Chlorhexidine Gluconate Cloth  6 each Topical Q0600  . insulin aspart  0-15 Units Subcutaneous TID WC  . insulin aspart  0-5 Units Subcutaneous QHS  . insulin detemir  10 Units Subcutaneous Q24H  . metoprolol succinate  12.5 mg Oral Daily  . pantoprazole  40 mg Oral QHS   Continuous Infusions: . sodium chloride 50 mL/hr at 12/15/18 1400  . sodium  chloride 10 mL/hr at 12/15/18 1400  . heparin 1,500 Units/hr (12/15/18 1400)     LOS: 7 days   Time spent: [place], MD [facility]  12/15/2018, 2:54 PM

## 2018-12-15 NOTE — Progress Notes (Signed)
ANTICOAGULATION CONSULT NOTE   Pharmacy Consult for Heparin  Indication: DVT  No Known Allergies  Patient Measurements: Height: 6\' 3"  (190.5 cm) Weight: 110 lb 3.7 oz (50 kg) IBW/kg (Calculated) : 84.5 Heparin Dosing Weight: 50 kg  Vital Signs: Temp: 98.8 F (37.1 C) (12/05 0754) Temp Source: Oral (12/05 0754) BP: 111/72 (12/05 1100) Pulse Rate: 99 (12/05 1100)  Labs: Recent Labs    12/13/18 0430  12/14/18 0148 12/14/18 1123 12/15/18 0249  HGB 9.2*  --  8.7*  --  8.9*  HCT 27.4*  --  26.8*  --  28.0*  PLT 173  --  198  --  244  HEPARINUNFRC 0.22*   < > 0.16* 0.31 0.40  CREATININE 6.87*  --  6.00*  --  5.49*   < > = values in this interval not displayed.    Estimated Creatinine Clearance: 10.5 mL/min (A) (by C-G formula based on SCr of 5.49 mg/dL (H)).  Assessment: Patient is a 58 year old male that was recently admitted at the end of last month, diagnosed with DVT and discharged on Xarelto. Noted AKI - SCr 12.91 on admit, trending down to 6 today. Heparin was started per pharmacy dosing.  Heparin level now therapeutic  Goal of Therapy:  Heparin level 0.3-0.7 units/ml Monitor platelets by anticoagulation protocol: Yes   Plan:  Continue heparin 1500 units/hr Daily hep lvl cbc  Barth Kirks, PharmD, BCPS, BCCCP Clinical Pharmacist [phone]  Please check AMION for all [place] numbers  12/15/2018 11:05 AM

## 2018-12-15 NOTE — Progress Notes (Signed)
Patient ID: Timothy Madden, male   DOB: 12-Nov-1960, 58 y.o.   MRN: [id number] [facility] Progress Note   Assessment/ Plan:   1. Acute kidney Injury on chronic kidney disease stage IV: Baseline creatinine around 2.5.  Secondary to DKA and obstructive uropathy/bladder outlet obstruction.  I did not see any imaging from yesterday that was previously planned/suggested by urology to determine need for ureteral stents.  Creatinine continues to trend down with polyuric urine output; no acute electrolyte abnormality or indications for dialysis-we will continue to follow closely to determine need for increasing rate of intravenous fluid (currently 1/2 NS 50 mL/hour). 2.  Diabetic ketoacidosis: Improved with glycemic control/alkalemia.  Ongoing glycemic control per primary service, with significant social barriers to continued outpatient management. 3.  Severe sepsis secondary to Serratia urinary tract infection: On Rocephin-last dose today.  Hemodynamically stable. 4.  Right leg deep venous thrombosis: On intravenous heparin at this time with plan to transition to oral anticoagulant following definitive procedure to alleviate urinary tract obstruction. 5.  History of psychiatric disorder: Complicated by difficult social situation/homeless status. 6.  Anemia: With low iron saturation but elevated ferritin limiting intravenous iron use.  Subjective:   Denies any complaints other than discomfort from Foley catheter.  Specifically denies chest pain or shortness of breath.  Appetite improving.   Objective:   BP 120/74   Pulse 97   Temp 98.3 F (36.8 C) (Oral)   Resp 17   Ht 6\' 3"  (1.905 m)   Wt 50 kg   SpO2 94%   BMI 13.78 kg/m   Intake/Output Summary (Last 24 hours) at 12/15/2018 5361 Last data filed at 12/15/2018 4431 Gross per 24 hour  Intake 2373.18 ml  Output 4696 ml  Net -2322.82 ml   Weight change:   Physical Exam: Gen: Comfortably sitting up in bed, listening to  television CVS: Pulse regular rhythm, S1 and S2 normal Resp: Anteriorly clear to auscultation, no rales/rhonchi Abd: Soft, flat, nontender Ext: Trace bilateral ankle edema  Imaging: US Renal  Result Date: 12/13/2018 CLINICAL DATA:  Hydronephrosis EXAM: RENAL / URINARY TRACT ULTRASOUND COMPLETE COMPARISON:  Renal ultrasound 12/10/2018 FINDINGS: Right Kidney: Renal measurements: 10.8 x 5.9 x 6.4 cm = volume: 214 mL. Mild to moderate right hydronephrosis, increased since prior study. Mildly increased echotexture. Left Kidney: Renal measurements: 12.1 x 5.7 x 4.7 cm = volume: 172 mL. Moderate left hydronephrosis, similar to prior study. Mildly increased echotexture. Bladder: Bladder is decompressed with Foley catheter in place. Other: None. IMPRESSION: Bilateral hydronephrosis, stable on the left, slightly worsened on the right since prior study. Electronically Signed   By: Rolm Baptise M.D.   On: 12/13/2018 19:10    Labs: BMET Recent Labs  Lab 12/10/18 0556  12/10/18 2336 12/11/18 0915 12/12/18 0404 12/12/18 1835 12/13/18 0430 12/14/18 0148 12/15/18 0249  NA 143   < > 143 142 146* 139 139 137 136  K 3.0*   < > 2.8* 2.9* 2.4* 2.7* 2.9* 4.1 4.0  CL 112*   < > 104 99 91* 86* 87* 91* 94*  CO2 16*   < > 21* 26 35* 37* 35* 32 29  GLUCOSE 196*   < > 111* 160* 113* 221* 108* 219* 144*  BUN 141*   < > 135* 128* 116* 101* 96* 92* 81*  CREATININE 9.87*   < > 9.20* 8.97* 8.15* 7.06* 6.87* 6.00* 5.49*  CALCIUM 7.0*   < > 7.1* 6.9* 6.9* 6.6* 6.4* 6.7* 7.5*  PHOS  4.1  --  4.2 3.4 4.1  --  3.6 1.8* 2.7   < > = values in this interval not displayed.   CBC Recent Labs  Lab 12/08/18 1201  12/12/18 0404 12/13/18 0430 12/14/18 0148 12/15/18 0249  WBC 15.7*   < > 22.4* 24.8* 19.5* 21.0*  NEUTROABS 14.2*  --   --   --  16.5*  --   HGB 12.3*   < > 9.4* 9.2* 8.7* 8.9*  HCT 42.8   < > 26.9* 27.4* 26.8* 28.0*  MCV 89.9   < > 74.1* 76.5* 78.6* 79.8*  PLT 573*   < > 161 173 198 244   < > = values in  this interval not displayed.    Medications:    . Chlorhexidine Gluconate Cloth  6 each Topical Q0600  . insulin aspart  0-15 Units Subcutaneous TID WC  . insulin aspart  0-5 Units Subcutaneous QHS  . insulin detemir  10 Units Subcutaneous Q24H  . metoprolol succinate  12.5 mg Oral Daily  . pantoprazole  40 mg Oral QHS   Elmarie Shiley, MD 12/15/2018, 7:14 AM

## 2018-12-15 NOTE — Progress Notes (Signed)
Chart review:  Patient's creatinine is trending down now 5.49 from 6.0 yesterday.  Renal ultrasound showed slightly improved bilateral hydronephrosis.  Discussed with attending physician that we will continue to watch creatinine.  Foley catheter to remain to gravity drainage.  We will hold off on any stents/nephrostomy tubes while creatinine is trending down and hydronephrosis is improving.    Renal US 12/15/18 CLINICAL DATA:  Hydronephrosis  EXAM: RENAL / URINARY TRACT ULTRASOUND COMPLETE  COMPARISON:  12/13/2018 and CT abdomen and pelvis 11/10/2018  FINDINGS: Right Kidney:  Renal measurements: 12.2 x 6.4 x 7.4 cm = volume: 300 mL. Marked renal cortical scarring and lobular renal contour similar to recent CT evaluation with persistent dilation of collecting systems in the right kidney though perhaps slightly improved compared to the most recent comparison.  Left Kidney:  Renal measurements: 12.4 x 7.6 x 7.1 cm = volume: 350 mL. Also with lobular renal contours, no discrete mass and with signs of persistent collecting system distension, also perhaps slightly improved compared to the previous exam.  Bladder:  Foley catheter in the urinary bladder which is collapsed about the catheter. Diffuse bladder wall thickening.  Other:  None.  IMPRESSION: 1. Similar to slightly improved appearance of hydronephrosis bilaterally.

## 2018-12-16 LAB — HEPARIN LEVEL (UNFRACTIONATED): Heparin Unfractionated: 0.34 IU/mL (ref 0.30–0.70)

## 2018-12-16 LAB — CBC
HCT: 27.2 % — ABNORMAL LOW (ref 39.0–52.0)
Hemoglobin: 8.7 g/dL — ABNORMAL LOW (ref 13.0–17.0)
MCH: 25.6 pg — ABNORMAL LOW (ref 26.0–34.0)
MCHC: 32 g/dL (ref 30.0–36.0)
MCV: 80 fL (ref 80.0–100.0)
Platelets: 309 10*3/uL (ref 150–400)
RBC: 3.4 MIL/uL — ABNORMAL LOW (ref 4.22–5.81)
RDW: 15.4 % (ref 11.5–15.5)
WBC: 19 10*3/uL — ABNORMAL HIGH (ref 4.0–10.5)
nRBC: 0 % (ref 0.0–0.2)

## 2018-12-16 LAB — GLUCOSE, CAPILLARY
Glucose-Capillary: 139 mg/dL — ABNORMAL HIGH (ref 70–99)
Glucose-Capillary: 189 mg/dL — ABNORMAL HIGH (ref 70–99)
Glucose-Capillary: 303 mg/dL — ABNORMAL HIGH (ref 70–99)
Glucose-Capillary: 340 mg/dL — ABNORMAL HIGH (ref 70–99)
Glucose-Capillary: 265 mg/dL — ABNORMAL HIGH (ref 70–99)

## 2018-12-16 LAB — RENAL FUNCTION PANEL
Albumin: 1.7 g/dL — ABNORMAL LOW (ref 3.5–5.0)
Anion gap: 13 (ref 5–15)
BUN: 77 mg/dL — ABNORMAL HIGH (ref 6–20)
CO2: 27 mmol/L (ref 22–32)
Calcium: 8.1 mg/dL — ABNORMAL LOW (ref 8.9–10.3)
Chloride: 97 mmol/L — ABNORMAL LOW (ref 98–111)
Creatinine, Ser: 4.89 mg/dL — ABNORMAL HIGH (ref 0.61–1.24)
GFR calc Af Amer: 14 mL/min — ABNORMAL LOW (ref >60)
GFR calc non Af Amer: 12 mL/min — ABNORMAL LOW (ref >60)
Glucose, Bld: 113 mg/dL — ABNORMAL HIGH (ref 70–99)
Phosphorus: 3.7 mg/dL (ref 2.5–4.6)
Potassium: 4.1 mmol/L (ref 3.5–5.1)
Sodium: 137 mmol/L (ref 135–145)

## 2018-12-16 MED ORDER — INSULIN DETEMIR 100 UNIT/ML ~~LOC~~ SOLN
15.0000 [IU] | SUBCUTANEOUS | Status: DC
  Administered 2018-12-17: 15 [IU] via SUBCUTANEOUS
  Filled 2018-12-16 (×2): qty 0.15

## 2018-12-16 NOTE — Progress Notes (Signed)
Hospitalist progress note + chart summary  Timothy Madden [id number] DOB: September 02, 1960 DOA: 12/08/2018  PCP: Marliss Coots, NP   Narrative:  58 year old male DM TY 2 CKD 3 (2.5-2.7 creatinine) previous osteomyelitis recent DVT, psychiatric issues?  Homeless hypoechoic mass kidneys admit ICU 1128 obtunded in severe DKA-developed severe AKI creatinine 12 potassium 7-urology and nephrology saw-recommended Foley to gravity Rx urinary tract infection  Data Reviewed:  Potassium 2.9-->4.0  BUN/creatinine 77/4.89 (on admission 182/12.9) albumin 1.7 calcium 7.5 Iron 18 TIBC 125 White count 21 Assessment & Plan: DKA uncontrolled diabetes mellitus 15-DKA resolved-CBG 474 yesterday-likely 2/2 to allwoing more of diet [extra tray]--no more supplemental feeds,increased Levemir 10->15 +moderate sliding scale coverage  HTN, sinus tach-transition to metoprolol XL 12.5  Serratia UTI-Rocephin stop date 12/4 Leukocytosis-unclear etiology--Rx finished for above--no fever--OP work up if persistent   AKI, hypokalemia (now resolved), hypocalcemia + hypomagnesemia,-now in polyuric phase,  -3.1 L  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 Claudia Desanctis of Urology-no overt need for Nephrostomies-likely can d/c with Foley--no further plans for Stenting or PCN Creatinine continues to improve  Diarrhea-2/2 supplements as it has decreased greatly since we have gone to once daily supplements-no more supplements--give extra diet  DVT 10/31-continue IV heparin-transition to Eliquis when Urologist finalizes poc  Anemia of renal disease-iron levels are somewhat low TIBC is low but in the setting of infection may choose to hold on further replacement   Psychiatric disorder-reported in chart but no meds and also reported he was homeless-tried to call mother and no response  Severe protein energy malnutrition BMI 13-supplement as needed-have cut back to once a day and stopped  reglan--encourage diet as patient has probable diarr from supplements  -transition to MedSurg-expect will be here several days needs skilled facility placement  No family avail, have tried multiple times--CSW to weight in as will likely need SNF remains inpatient, full code loveneox  CHART REVIEW . 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  Leading to possible dysfunctional ureterovesicular junction . Admission for DKA 6/18 through 6/22-at that time found to have hypoechoic mass . Admission 10/16 through 11/02/2013 osteomyelitis left finger with amputation at that time  Subjective:  Coherent OOB today no distress slow responses but clearer than prior  empty  food tray at bedside   Consultants:   uro  nephro Procedures:   n Antimicrobials:   Ceftriaxone till 12/4   Objective: Vitals:   12/15/18 1614 12/15/18 2100 12/16/18 0523 12/16/18 1404  BP: 140/84 125/74 131/82 114/73  Pulse: 91 96 91 (!) 101  Resp: 16 18 17 18   Temp: 98.3 F (36.8 C) 98.9 F (37.2 C) 98.5 F (36.9 C) 99.9 F (37.7 C)  TempSrc: Oral Oral Oral Oral  SpO2: 100% 97% 98% 99%  Weight:      Height:        Intake/Output Summary (Last 24 hours) at 12/16/2018 1437 Last data filed at 12/16/2018 0950 Gross per 24 hour  Intake 360 ml  Output 3200 ml  Net -2840 ml   Filed Weights   12/08/18 1200 12/08/18 2050 12/09/18 0300  Weight: 86.9 kg 60.8 kg 50 kg    Examination:  Emaciated in nad-moe coherent  No focal deficit s1 s 2no m Foley in place abd soft nt nd  No le edema Neuro intact   Scheduled Meds: . Chlorhexidine Gluconate Cloth  6 each Topical Q0600  . insulin aspart  0-15 Units Subcutaneous TID WC  . insulin aspart  0-5 Units Subcutaneous QHS  . insulin detemir  10 Units Subcutaneous Q24H  . metoprolol succinate  12.5 mg Oral Daily  .  pantoprazole  40 mg Oral QHS   Continuous Infusions: . sodium chloride 50 mL/hr at 12/15/18 1704  . sodium chloride 10 mL/hr at 12/15/18 1400  . heparin 1,500 Units/hr (12/16/18 1126)     LOS: 8 days   Time spent: [place], MD [facility]  12/16/2018, 2:37 PM

## 2018-12-16 NOTE — Progress Notes (Signed)
Patient ID: Timothy Madden, male   DOB: 11/02/1960, 58 y.o.   MRN: [id number] [facility] Progress Note   Assessment/ Plan:   1. Acute kidney Injury on chronic kidney disease stage IV: Baseline creatinine around 2.5.  Secondary to DKA and obstructive uropathy/bladder outlet obstruction.  Labs continue to show gradual improvement of renal function with decent urine output-we will continue maintenance intravenous fluids.  Renal ultrasound from yesterday indicate some improvement of hydronephrosis and plans for stenting have been deferred at this time. 2.  Diabetic ketoacidosis: Improved with glycemic control/alkalemia.  Ongoing glycemic control per primary service, with significant social barriers to continued outpatient management. 3.  Severe sepsis secondary to Serratia urinary tract infection: On Rocephin-last dose today.  Hemodynamically stable. 4.  Right leg deep venous thrombosis: On intravenous heparin at this time with plan to transition to oral anticoagulant following definitive procedure to alleviate urinary tract obstruction. 5.  History of psychiatric disorder: Complicated by difficult social situation/homeless status. 6.  Anemia: With low iron saturation but elevated ferritin limiting intravenous iron use.  Subjective:   Reports no complaints, denies any chest pain or shortness of breath.   Objective:   BP 131/82 (BP Location: Right Arm)   Pulse 91   Temp 98.5 F (36.9 C) (Oral)   Resp 17   Ht 6\' 3"  (1.905 m)   Wt 50 kg   SpO2 98%   BMI 13.78 kg/m   Intake/Output Summary (Last 24 hours) at 12/16/2018 1054 Last data filed at 12/16/2018 0950 Gross per 24 hour  Intake 904.01 ml  Output 3950 ml  Net -3045.99 ml   Weight change:   Physical Exam: Gen: Resting comfortably in bed, watching television  CVS: Pulse regular rhythm, S1 and S2 normal Resp: Anteriorly clear to auscultation, no rales/rhonchi Abd: Soft, flat, nontender Ext: Without lower extremity  edema  Imaging: US Renal  Result Date: 12/15/2018 CLINICAL DATA:  Hydronephrosis EXAM: RENAL / URINARY TRACT ULTRASOUND COMPLETE COMPARISON:  12/13/2018 and CT abdomen and pelvis 11/10/2018 FINDINGS: Right Kidney: Renal measurements: 12.2 x 6.4 x 7.4 cm = volume: 300 mL. Marked renal cortical scarring and lobular renal contour similar to recent CT evaluation with persistent dilation of collecting systems in the right kidney though perhaps slightly improved compared to the most recent comparison. Left Kidney: Renal measurements: 12.4 x 7.6 x 7.1 cm = volume: 350 mL. Also with lobular renal contours, no discrete mass and with signs of persistent collecting system distension, also perhaps slightly improved compared to the previous exam. Bladder: Foley catheter in the urinary bladder which is collapsed about the catheter. Diffuse bladder wall thickening. Other: None. IMPRESSION: 1. Similar to slightly improved appearance of hydronephrosis bilaterally. Electronically Signed   By: Zetta Bills M.D.   On: 12/15/2018 11:31    Labs: BMET Recent Labs  Lab 12/10/18 2336 12/11/18 0915 12/12/18 0404 12/12/18 1835 12/13/18 0430 12/14/18 0148 12/15/18 0249 12/16/18 0324  NA 143 142 146* 139 139 137 136 137  K 2.8* 2.9* 2.4* 2.7* 2.9* 4.1 4.0 4.1  CL 104 99 91* 86* 87* 91* 94* 97*  CO2 21* 26 35* 37* 35* 32 29 27  GLUCOSE 111* 160* 113* 221* 108* 219* 144* 113*  BUN 135* 128* 116* 101* 96* 92* 81* 77*  CREATININE 9.20* 8.97* 8.15* 7.06* 6.87* 6.00* 5.49* 4.89*  CALCIUM 7.1* 6.9* 6.9* 6.6* 6.4* 6.7* 7.5* 8.1*  PHOS 4.2 3.4 4.1  --  3.6 1.8* 2.7 3.7   CBC Recent Labs  Lab 12/13/18 0430 12/14/18 0148 12/15/18 0249 12/16/18 0324  WBC 24.8* 19.5* 21.0* 19.0*  NEUTROABS  --  16.5*  --   --   HGB 9.2* 8.7* 8.9* 8.7*  HCT 27.4* 26.8* 28.0* 27.2*  MCV 76.5* 78.6* 79.8* 80.0  PLT 173 198 244 309    Medications:    . Chlorhexidine Gluconate Cloth  6 each Topical Q0600  . insulin aspart  0-15  Units Subcutaneous TID WC  . insulin aspart  0-5 Units Subcutaneous QHS  . insulin detemir  10 Units Subcutaneous Q24H  . metoprolol succinate  12.5 mg Oral Daily  . pantoprazole  40 mg Oral QHS   Elmarie Shiley, MD 12/16/2018, 10:54 AM

## 2018-12-16 NOTE — Progress Notes (Signed)
ANTICOAGULATION CONSULT NOTE   Pharmacy Consult for Heparin  Indication: DVT  No Known Allergies  Patient Measurements: Height: 6\' 3"  (190.5 cm) Weight: 110 lb 3.7 oz (50 kg) IBW/kg (Calculated) : 84.5 Heparin Dosing Weight: 50 kg  Vital Signs: Temp: 98.5 F (36.9 C) (12/06 0523) Temp Source: Oral (12/06 0523) BP: 131/82 (12/06 0523) Pulse Rate: 91 (12/06 0523)  Labs: Recent Labs    12/14/18 0148 12/14/18 1123 12/15/18 0249 12/16/18 0324  HGB 8.7*  --  8.9* 8.7*  HCT 26.8*  --  28.0* 27.2*  PLT 198  --  244 309  HEPARINUNFRC 0.16* 0.31 0.40 0.34  CREATININE 6.00*  --  5.49* 4.89*    Estimated Creatinine Clearance: 11.8 mL/min (A) (by C-G formula based on SCr of 4.89 mg/dL (H)).  Assessment: Patient is a 58 year old male that was recently admitted at the end of last month, diagnosed with DVT and discharged on Xarelto. Noted AKI - SCr 12.91 on admit, trending down to 5.49 today. Heparin was started 12/09/18 per pharmacy dosing.  Heparin level remains therapeutic.  PLTC wnl stable, Hgb low stable at 8.7,  No bleeding reported  Goal of Therapy:  Heparin level 0.3-0.7 units/ml Monitor platelets by anticoagulation protocol: Yes   Plan:  Continue heparin 1500 units/hr Daily hep lvl cbc  Nicole Cella, RPh Clinical Pharmacist [phone] Please check AMION for all [place] numbers 12/16/2018 8:39 AM

## 2018-12-16 NOTE — Progress Notes (Signed)
Pt ambulated in the room and sat on the chair for a couple of hours.

## 2018-12-17 ENCOUNTER — Encounter (HOSPITAL_COMMUNITY): Payer: Self-pay | Admitting: General Practice

## 2018-12-17 ENCOUNTER — Other Ambulatory Visit: Payer: Self-pay

## 2018-12-17 LAB — GLUCOSE, CAPILLARY
Glucose-Capillary: 111 mg/dL — ABNORMAL HIGH (ref 70–99)
Glucose-Capillary: 260 mg/dL — ABNORMAL HIGH (ref 70–99)
Glucose-Capillary: 264 mg/dL — ABNORMAL HIGH (ref 70–99)
Glucose-Capillary: 242 mg/dL — ABNORMAL HIGH (ref 70–99)

## 2018-12-17 LAB — CBC
HCT: 26.9 % — ABNORMAL LOW (ref 39.0–52.0)
Hemoglobin: 8.5 g/dL — ABNORMAL LOW (ref 13.0–17.0)
MCH: 25.6 pg — ABNORMAL LOW (ref 26.0–34.0)
MCHC: 31.6 g/dL (ref 30.0–36.0)
MCV: 81 fL (ref 80.0–100.0)
Platelets: 335 10*3/uL (ref 150–400)
RBC: 3.32 MIL/uL — ABNORMAL LOW (ref 4.22–5.81)
RDW: 15.5 % (ref 11.5–15.5)
WBC: 20 10*3/uL — ABNORMAL HIGH (ref 4.0–10.5)
nRBC: 0 % (ref 0.0–0.2)

## 2018-12-17 LAB — RENAL FUNCTION PANEL
Albumin: 1.6 g/dL — ABNORMAL LOW (ref 3.5–5.0)
Anion gap: 14 (ref 5–15)
BUN: 72 mg/dL — ABNORMAL HIGH (ref 6–20)
CO2: 23 mmol/L (ref 22–32)
Calcium: 8.2 mg/dL — ABNORMAL LOW (ref 8.9–10.3)
Chloride: 99 mmol/L (ref 98–111)
Creatinine, Ser: 4.69 mg/dL — ABNORMAL HIGH (ref 0.61–1.24)
GFR calc Af Amer: 15 mL/min — ABNORMAL LOW (ref >60)
GFR calc non Af Amer: 13 mL/min — ABNORMAL LOW (ref >60)
Glucose, Bld: 128 mg/dL — ABNORMAL HIGH (ref 70–99)
Phosphorus: 4.7 mg/dL — ABNORMAL HIGH (ref 2.5–4.6)
Potassium: 4.4 mmol/L (ref 3.5–5.1)
Sodium: 136 mmol/L (ref 135–145)

## 2018-12-17 LAB — HEPARIN LEVEL (UNFRACTIONATED): Heparin Unfractionated: 0.33 IU/mL (ref 0.30–0.70)

## 2018-12-17 MED ORDER — DARBEPOETIN ALFA 60 MCG/0.3ML IJ SOSY
60.0000 ug | PREFILLED_SYRINGE | INTRAMUSCULAR | Status: DC
  Administered 2018-12-17 – 2019-01-07 (×4): 60 ug via SUBCUTANEOUS
  Filled 2018-12-17 (×5): qty 0.3

## 2018-12-17 MED ORDER — APIXABAN 5 MG PO TABS
5.0000 mg | ORAL_TABLET | Freq: Two times a day (BID) | ORAL | Status: DC
  Administered 2018-12-17 – 2019-01-19 (×67): 5 mg via ORAL
  Filled 2018-12-17 (×67): qty 1

## 2018-12-17 NOTE — Social Work (Addendum)
2:17pm- Aware that pt still rec for SNF, only oriented to self. Two HIPAA compliant calls/messages left for pt mother at number listed. Barriers remain lack of insurance at this time.   9:29am- CSW acknowledging consult for SNF placement- pt just transferred to 6N, no SNF w/u complete at this time. At current time waiting on updated PT evaluation to assess pt needs- without insurance placement will be challenging. Will need to verify skilled needs for SNF placement under LOG.   Will follow for therapy recommendations needed to best determine disposition/for insurance authorization.   Westley Hummer, MSW, [place] Work [phone]

## 2018-12-17 NOTE — [place] FL2 (Signed)
[place] MEDICAID FL2 LEVEL OF CARE SCREENING TOOL     IDENTIFICATION  Patient Name: Timothy Madden Birthdate: [birth year] Sex: male Admission Date (Current Location): 12/08/2018  [facility] and [place] Number:  Herbalist and Address:  [facility], [place] [address], [place], [place] [address]      Provider Number: [id number]  Attending Physician Name and Address:  Nita Sells, MD  Relative Name and Phone Number:       Current Level of Care: Hospital Recommended Level of Care: [place] Prior Approval Number:    Date Approved/Denied:   PASRR Number: manual review  Discharge Plan: SNF    Current Diagnoses: Patient Active Problem List   Diagnosis Date Noted  . Pressure injury of skin 12/15/2018  . Protein-calorie malnutrition, severe 12/11/2018  . Acute lower UTI 12/11/2018  . Bacteremia due to Gram-negative bacteria 12/11/2018  . Bilateral hydronephrosis 12/11/2018  . DVT (deep venous thrombosis) ([place]) 12/11/2018  . Metabolic acidosis 41/32/4401  . Anemia due to chronic kidney disease 12/11/2018  . DKA, type 1, not at goal [facility]) 12/08/2018  . 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 Acute stress disorder 11/14/2018  . Leg swelling 11/10/2018  . DKA, type 2 ([place]) 06/28/2018  . Acute renal failure superimposed on chronic kidney disease ([place]) 06/28/2018  . Elevated lipase 06/28/2018  . Osteomyelitis of finger of left hand ([place]) 10/26/2013  . Osteomyelitis ([place]) 10/26/2013  . Sepsis ([place]) 10/26/2013  . AKI (acute kidney injury) ([place]) 10/26/2013  . Hyponatremia 10/26/2013  . Type I diabetes mellitus with complication, uncontrolled ([place]) 10/26/2013  . Essential hypertension 10/26/2013  . Anemia of chronic disease 10/26/2013  . PSYCHIATRIC DISORDER 07/19/2006  . TOBACCO ABUSE 07/19/2006  . Type 2 diabetes mellitus with stage 3 chronic kidney disease ([place]) 11/30/2005  . ANXIETY 11/30/2005    Orientation RESPIRATION BLADDER  Height & Weight     Self  Normal Incontinent Weight: 110 lb 3.7 oz (50 kg) Height:  6\' 3"  (190.5 cm)  BEHAVIORAL SYMPTOMS/MOOD NEUROLOGICAL BOWEL NUTRITION STATUS      Continent Diet(see discharge summary)  AMBULATORY STATUS COMMUNICATION OF NEEDS Skin   Limited Assist Verbally PU Stage and Appropriate Care, Skin abrasions, Other (Comment)(stage 2 on sacrum with foam; MASD on buttocks with skin tear; cracking on bilateral heels)                       Personal Care Assistance Level of Assistance  Dressing, Feeding, Bathing Bathing Assistance: Limited assistance Feeding assistance: Independent Dressing Assistance: Limited assistance     Functional Limitations Info  Sight, Hearing, Speech Sight Info: Adequate Hearing Info: Adequate Speech Info: Adequate    SPECIAL CARE FACTORS FREQUENCY  PT (By licensed PT), OT (By licensed OT)     PT Frequency: 5x week OT Frequency: 5x week            Contractures Contractures Info: Not present    Additional Factors Info  Code Status, Allergies, Insulin Sliding Scale Code Status Info: Full Code Allergies Info: No Known Allergies   Insulin Sliding Scale Info: insulin aspart (novoLOG) injection 0-5 Units daily at bedtime; insulin aspart (novoLOG) injection 0-15 Units 3x daily with bedtime; insulin detemir (LEVEMIR) injection 15 Units every 24 hours       Current Medications (12/17/2018):  This is the current hospital active medication list Current Facility-Administered Medications  Medication Dose Route Frequency Provider Last Rate Last Dose  . 0.45 % sodium chloride  infusion   Intravenous Continuous Nita Sells, MD 50 mL/hr at 12/17/18 1240    . 0.9 %  sodium chloride infusion   Intravenous PRN Nita Sells, MD 10 mL/hr at 12/15/18 1400    . acetaminophen (TYLENOL) tablet 650 mg  650 mg Oral Q6H PRN Nita Sells, MD   650 mg at 12/16/18 1414  . apixaban (ELIQUIS) tablet 5 mg  5 mg Oral BID Nita Sells, MD      . Chlorhexidine Gluconate Cloth 2 % PADS 6 each  6 each Topical Q0600 Nita Sells, MD   6 each at 12/17/18 0519  . Darbepoetin Alfa (ARANESP) injection 60 mcg  60 mcg Subcutaneous Q Mon-1800 Elmarie Shiley, MD      . dextrose 50 % solution 0-50 mL  0-50 mL Intravenous PRN Nita Sells, MD      . insulin aspart (novoLOG) injection 0-15 Units  0-15 Units Subcutaneous TID WC Nita Sells, MD   8 Units at 12/17/18 1240  . insulin aspart (novoLOG) injection 0-5 Units  0-5 Units Subcutaneous QHS Nita Sells, MD   3 Units at 12/16/18 2206  . insulin detemir (LEVEMIR) injection 15 Units  15 Units Subcutaneous Q24H Nita Sells, MD   15 Units at 12/17/18 1239  . metoprolol succinate (TOPROL-XL) 24 hr tablet 12.5 mg  12.5 mg Oral Daily Nita Sells, MD   12.5 mg at 12/17/18 0921  . nitroGLYCERIN (NITROSTAT) SL tablet 0.4 mg  0.4 mg Sublingual Q5 min PRN Nita Sells, MD   0.4 mg at 12/10/18 0434  . pantoprazole (PROTONIX) EC tablet 40 mg  40 mg Oral QHS Nita Sells, MD   40 mg at 12/16/18 2206     Discharge Medications: Please see discharge summary for a list of discharge medications.  Relevant Imaging Results:  Relevant Lab Results:   Additional Information SS[id number] [place], [place]

## 2018-12-17 NOTE — Progress Notes (Signed)
Patient ID: Timothy Madden, male   DOB: November 07, 1960, 58 y.o.   MRN: [id number] [facility] Progress Note   Assessment/ Plan:   1. Acute kidney Injury on chronic kidney disease stage IV: Baseline creatinine around 2.5.  Secondary to DKA and obstructive uropathy/bladder outlet obstruction.  Sluggishly improving renal function noted with decreasing urine output (now not polyuric).  We will continue maintenance IV fluids for the next 24 hours and discontinue these if urine output continues to decrease.  It is entirely plausible that he will settle out at a new higher baseline creatinine if obstruction was present long enough (usually >2 weeks).  No indications for ureteral stenting per urology. 2.  Diabetic ketoacidosis: Resolved.  Ongoing glycemic control per primary service, with significant social barriers to continued outpatient management. 3.  Severe sepsis secondary to Serratia urinary tract infection: On Rocephin-last dose today.  Hemodynamically stable. 4.  Right leg deep venous thrombosis: On intravenous heparin; difficult to make choice of oral anticoagulant with his social difficulties-Coumadin will require close monitoring and Eliquis may be cost prohibitive. 5.  History of psychiatric disorder: Complicated by difficult social situation/homeless status. 6.  Anemia: With low iron saturation but elevated ferritin limiting intravenous iron use.  Will give ESA.  Subjective:   Reports he had a comfortable night, denies any chest pain or shortness of breath   Objective:   BP 126/80 (BP Location: Right Arm)   Pulse 99   Temp 98 F (36.7 C) (Oral)   Resp 17   Ht 6\' 3"  (1.905 m)   Wt 50 kg   SpO2 97%   BMI 13.78 kg/m   Intake/Output Summary (Last 24 hours) at 12/17/2018 0959 Last data filed at 12/17/2018 0857 Gross per 24 hour  Intake 960 ml  Output 2500 ml  Net -1540 ml   Weight change:   Physical Exam: Gen: Appears comfortable resting in bed, interactive with minimal  verbal responses CVS: Pulse regular rhythm, S1 and S2 normal Resp: Anteriorly clear to auscultation, no rales/rhonchi Abd: Soft, flat, nontender Ext: No palpable lower extremity edema  Imaging: No results found.  Labs: BMET Recent Labs  Lab 12/11/18 0915 12/12/18 0404 12/12/18 1835 12/13/18 0430 12/14/18 0148 12/15/18 0249 12/16/18 0324 12/17/18 0302  NA 142 146* 139 139 137 136 137 136  K 2.9* 2.4* 2.7* 2.9* 4.1 4.0 4.1 4.4  CL 99 91* 86* 87* 91* 94* 97* 99  CO2 26 35* 37* 35* 32 29 27 23   GLUCOSE 160* 113* 221* 108* 219* 144* 113* 128*  BUN 128* 116* 101* 96* 92* 81* 77* 72*  CREATININE 8.97* 8.15* 7.06* 6.87* 6.00* 5.49* 4.89* 4.69*  CALCIUM 6.9* 6.9* 6.6* 6.4* 6.7* 7.5* 8.1* 8.2*  PHOS 3.4 4.1  --  3.6 1.8* 2.7 3.7 4.7*   CBC Recent Labs  Lab 12/14/18 0148 12/15/18 0249 12/16/18 0324 12/17/18 0302  WBC 19.5* 21.0* 19.0* 20.0*  NEUTROABS 16.5*  --   --   --   HGB 8.7* 8.9* 8.7* 8.5*  HCT 26.8* 28.0* 27.2* 26.9*  MCV 78.6* 79.8* 80.0 81.0  PLT 198 244 309 335    Medications:    . Chlorhexidine Gluconate Cloth  6 each Topical Q0600  . insulin aspart  0-15 Units Subcutaneous TID WC  . insulin aspart  0-5 Units Subcutaneous QHS  . insulin detemir  15 Units Subcutaneous Q24H  . metoprolol succinate  12.5 mg Oral Daily  . pantoprazole  40 mg Oral QHS   Elmarie Shiley,  MD 12/17/2018, 9:59 AM

## 2018-12-17 NOTE — Progress Notes (Signed)
ANTICOAGULATION CONSULT NOTE   Pharmacy Consult for Heparin  Indication: DVT  No Known Allergies  Patient Measurements: Height: 6\' 3"  (190.5 cm) Weight: 110 lb 3.7 oz (50 kg) IBW/kg (Calculated) : 84.5 Heparin Dosing Weight: 50 kg  Vital Signs: Temp: 98 F (36.7 C) (12/07 0434) Temp Source: Oral (12/07 0434) BP: 126/80 (12/07 0434) Pulse Rate: 99 (12/07 0434)  Labs: Recent Labs    12/15/18 0249 12/16/18 0324 12/17/18 0302  HGB 8.9* 8.7* 8.5*  HCT 28.0* 27.2* 26.9*  PLT 244 309 335  HEPARINUNFRC 0.40 0.34 0.33  CREATININE 5.49* 4.89* 4.69*    Estimated Creatinine Clearance: 12.3 mL/min (A) (by C-G formula based on SCr of 4.69 mg/dL (H)).  Assessment: Patient is a 58 year old male that was recently admitted at the end of last month, diagnosed with DVT and discharged on Xarelto. Noted AKI - SCr 12.91 on admit, trending down to 5.49 today. Heparin was started 12/09/18 per pharmacy dosing.  Heparin level remains therapeutic. CBC stable  Goal of Therapy:  Heparin level 0.3-0.7 units/ml Monitor platelets by anticoagulation protocol: Yes   Plan:  Continue heparin 1500 units/hr Daily hep lvl cbc  Barth Kirks, PharmD, BCPS, BCCCP Clinical Pharmacist [phone]  Please check AMION for all [place] numbers  12/17/2018 10:31 AM

## 2018-12-17 NOTE — Progress Notes (Signed)
Physical Therapy Treatment Patient Details Name: Timothy Madden MRN: [id number] DOB: 1960/02/17 Today's Date: 12/17/2018    History of Present Illness This is a 58 year old man with a history of numerous psychiatric issues, diabetes poorly controlled supposed to be on home insulin presenting with obtundation from home found to be in severe DKA/HOCM.  PMH DKA, DM, L second finger amputation.    PT Comments    Continuing work on functional mobility and activity tolerance;  Able to make modest progress with activity and standing tolerance, and initiated walking with RW; Slow, but notable progress; Continue to recommend SNF post-acutely  Follow Up Recommendations  SNF     Equipment Recommendations  Rolling walker with 5" wheels    Recommendations for Other Services       Precautions / Restrictions Precautions Precautions: Fall Restrictions Weight Bearing Restrictions: No    Mobility  Bed Mobility Overal bed mobility: Needs Assistance Bed Mobility: Supine to Sit     Supine to sit: Mod assist     General bed mobility comments: Assist to manage bedclothes, and untangle lines; Assist to help LEs off of the bed, and moderate handheld assist to pull to sit; very slow moving; assist to place feet flat on the floor sitting EOB  Transfers Overall transfer level: Needs assistance Equipment used: Rolling walker (2 wheeled) Transfers: Sit to/from Stand Sit to Stand: +2 physical assistance;Mod assist         General transfer comment: Initiated sit to stand from siiting pretty far back in the bed despite cues to scoot to the EOB; Mod assist to anterior weight shift and get hips over base of suport to stand  Ambulation/Gait Ambulation/Gait assistance: Min assist;+2 safety/equipment Gait Distance (Feet): 6 Feet Assistive device: Rolling walker (2 wheeled) Gait Pattern/deviations: Decreased step length - right;Decreased step length - left;Decreased stride length Gait velocity:  slow   General Gait Details: Slow steps and with good use of RW to steady; Was able t clearly state when he was done walking, and we pulled the chair to him   Stairs             Wheelchair Mobility    Modified Rankin (Stroke Patients Only)       Balance     Sitting balance-Leahy Scale: Fair Sitting balance - Comments: once positioned upright with feet on floor balanced without support, hands in his lap     Standing balance-Leahy Scale: Poor                              Cognition Arousal/Alertness: Awake/alert Behavior During Therapy: Flat affect Overall Cognitive Status: No family/caregiver present to determine baseline cognitive functioning Area of Impairment: Problem solving                             Problem Solving: Slow processing;Decreased initiation;Difficulty sequencing;Requires verbal cues;Requires tactile cues General Comments: limited verbal responses, mumbling, but accurate with yes/no, follows some commands with increased time, gestures; able to communicate, "I'm not going any further" when he was done ambulating      Exercises      General Comments General comments (skin integrity, edema, etc.): Session conducted on room air      Pertinent Vitals/Pain Pain Assessment: Faces Faces Pain Scale: Hurts a little bit Pain Location: Generalized stiffness Pain Descriptors / Indicators: Grimacing Pain Intervention(s): Monitored during session    Home Living Family/patient  expects to be discharged to:: Private residence Living Arrangements: Parent                  Prior Function            PT Goals (current goals can now be found in the care plan section) Acute Rehab PT Goals Patient Stated Goal: Did not state, but agreeable to getting OOB PT Goal Formulation: With patient Time For Goal Achievement: 12/24/18 Potential to Achieve Goals: Fair Progress towards PT goals: Progressing toward goals    Frequency     Min 2X/week      PT Plan Current plan remains appropriate    Co-evaluation              AM-PAC PT "6 Clicks" Mobility   Outcome Measure  Help needed turning from your back to your side while in a flat bed without using bedrails?: A Lot Help needed moving from lying on your back to sitting on the side of a flat bed without using bedrails?: A Lot Help needed moving to and from a bed to a chair (including a wheelchair)?: A Lot Help needed standing up from a chair using your arms (e.g., wheelchair or bedside chair)?: A Little Help needed to walk in hospital room?: A Lot Help needed climbing 3-5 steps with a railing? : A Lot 6 Click Score: 13    End of Session Equipment Utilized During Treatment: Gait belt Activity Tolerance: Patient tolerated treatment well Patient left: in chair;with call bell/phone within reach;with chair alarm set Nurse Communication: Mobility status PT Visit Diagnosis: Other abnormalities of gait and mobility (R26.89);Muscle weakness (generalized) (M62.81);Other symptoms and signs involving the nervous system (R29.898)     Time: 1010-1030 PT Time Calculation (min) (ACUTE ONLY): 20 min  Charges:  $Therapeutic Activity: 8-22 mins                     Roney Marion, PT  Acute Rehabilitation Services Pager [phone] Office [place] 12/17/2018, 11:41 AM

## 2018-12-17 NOTE — Progress Notes (Signed)
Urology Consult Progress Note    Interval: Creatinine today 12/7 is 4.7, continues to downtrend from 12 on admission. UOP continues to be ~3L/day. Clear yellow.  [name]. Would prefer to have a urethral catheter to CIC or SPT at this time.  12/3 RUS with moderate right hydro and left moderate hydro 12/5 RUS with mild improvement.  Completing treatment for Serratia UTI.   Initial HPI from 12/1; [name] with history of uncontrolled diabetes, CKD stage III-IV, noncompliance, urinary retention, bilateral hydronephrosis.   Patient was initially seen by the urology service in June 2020 when he was admitted for an episode of diabetic ketoacidosis with a creatinine of approximately 6.7, Foley catheter was placed and creatinine subsequently improved with large volume urine output.  At that time he did report several years of lower urinary tract symptoms and renal ultrasound did demonstrate bilateral hydronephrosis and a thickened bladder wall.  He was started on Flomax.  Follow-up ultrasound approximately 4 weeks later demonstrated improvement in bilateral hydronephrosis with persistent bladder wall thickening.  He was then readmitted in late October 2020, again for diabetic ketoacidosis. CT at that time demonstrated bilateral hydroureteronephrosis to the level of the UVJ bilaterally.  Prior urine cultures have grown multiple species, unclear if this represents contamination or polymicrobial infections.  Urine culture from June 2020 grew staph epidermidis.  Most recent A1c November 10, 2018 was 14.3, upon review of labs over the past 5 years, his A1c has never been below 14.  Recently admitted again when obtunded and with DKA and sepsis on 12/08/2018. Foley catheter was placed. Creatinine was ~12. No hyperkalemia or hyperphosphatemia. Since foley placement, has had large volume UOP. WBC on admission was 15.7 with temp 102 and WBC rose to 20.  UClx grew Serratia, narrowed to CTX. BClx with Serratia. RUS 11/30 with mild bilateral hydronephrosis L>R and persistent bladder wall thickening.   In discussion today, patient reports that he had been taking his tamsulosin as previously described.  He reported that since that time, he had been voiding reasonably well at home with no frequency, no urgency, no dysuria, no hematuria but did note incomplete emptying.  He denies any episodes of left or right flank pain.  Does not think that he has had a urinary tract infection to his knowledge since being discharged previously.  He is unclear what brought him into the hospital this time.  He is tolerating the catheter well.  Denies any flank pain. [name] with history of uncontrolled diabetes, CKD stage III-IV, noncompliance, urinary retention, bilateral hydronephrosis.   Patient was initially seen by the urology service in June 2020 when he was admitted for an episode of diabetic ketoacidosis with a creatinine of approximately 6.7, Foley catheter was placed and creatinine subsequently improved with large volume urine output.  At that time he did report several years of lower urinary tract symptoms and renal ultrasound did demonstrate bilateral hydronephrosis and a thickened bladder wall.  He was started on Flomax.  Follow-up ultrasound approximately 4 weeks later demonstrated improvement in bilateral hydronephrosis with persistent bladder wall thickening.  He was then readmitted in late October 2020, again for diabetic ketoacidosis. CT at that time demonstrated bilateral hydroureteronephrosis to the level of the UVJ bilaterally.  Prior urine cultures have grown multiple species, unclear if this represents contamination or polymicrobial infections.  Urine culture from June 2020 grew staph epidermidis.  Most recent A1c November 10, 2018 was 14.3, upon review of labs over the past 5 years, his A1c has never been below 14.  Recently  admitted again when obtunded and with DKA and sepsis on 12/08/2018. Foley catheter was placed. Creatinine was ~12. No hyperkalemia or hyperphosphatemia. Since foley placement, has had large volume UOP. WBC on admission was 15.7 with temp 102 and WBC rose to 20. UClx grew Serratia, narrowed to CTX. BClx with Serratia. RUS 11/30 with mild bilateral hydronephrosis L>R and persistent bladder wall thickening.   In discussion today, patient reports that he had been taking his tamsulosin as previously described.  He reported that since that time, he had been voiding reasonably well at home with no frequency, no urgency, no dysuria, no hematuria but did note incomplete emptying.  He denies any episodes of left or right flank pain.  Does not think that he has had a urinary tract infection to his knowledge since being discharged previously.  He is unclear what brought him into the hospital this time.  He is tolerating the catheter well.  Denies any flank pain.  Objective: Vital signs in last 24 hours: Temp:  [97.8 F (36.6 C)-98.6 F (37 C)] 97.8 F (36.6 C) (12/07 1354) Pulse Rate:  [90-102] 90 (12/07 1515) Resp:  [17-20] 20 (12/07 1354) BP: (88-126)/(51-80) 99/69 (12/07 1515) SpO2:  [97 %-99 %] 99 % (12/07 1354)  Intake/Output from previous day: 12/06 0701 - 12/07 0700 In: 600 [P.O.:600] Out: 3300 [Urine:3300] Intake/Output this shift: Total I/O In: 720 [P.O.:720] Out: 1025 [Urine:1025]  Physical Exam:  General: Alert and oriented CV: Regular rate Abdomen:  Soft, nontender.  GU: Foley in place draining clear yellow urine. Circumcised. Meatus normal without irritation.   Ext: NT, No erythema  Lab Results:  Recent Labs    12/16/18 0324 12/17/18 0302  NA 137 136  K 4.1 4.4  CL 97* 99  CO2 27 23  GLUCOSE 113* 128*  BUN 77* 72*  CREATININE 4.89* 4.69*  CALCIUM 8.1* 8.2*    Studies/Results: Renal US 12/15/18 CLINICAL DATA: Hydronephrosis  EXAM: RENAL / URINARY TRACT  ULTRASOUND COMPLETE  COMPARISON: 12/13/2018 and CT abdomen and pelvis 11/10/2018  FINDINGS: Right Kidney:  Renal measurements: 12.2 x 6.4 x 7.4 cm = volume: 300 mL. Marked renal cortical scarring and lobular renal contour similar to recent CT evaluation with persistent dilation of collecting systems in the right kidney though perhaps slightly improved compared to the most recent comparison.  Left Kidney:  Renal measurements: 12.4 x 7.6 x 7.1 cm = volume: 350 mL. Also with lobular renal contours, no discrete mass and with signs of persistent collecting system distension, also perhaps slightly improved compared to the previous exam.  Bladder:  Foley catheter in the urinary bladder which is collapsed about the catheter. Diffuse bladder wall thickening.  Other:  None.  IMPRESSION: 1. Similar to slightly improved appearance of hydronephrosis bilaterally.  Assessment/Plan:  58 y.o. male with brittle diabetes, prior episodes of DKA, recurrent urinary retention, voiding dysfunction, bilateral hydronephrosis.   Bilateral hydronephrosis: This has been present on imaging of his kidneys since June, has improved with Foley prior. Continues to improve with catheter drainage this admission. Likely a chronic component to his bilateral hydronephrosis; given prior CT scan demonstrating bilateral hydroureteronephrosis to the level of the bladder suggesting a lower tract etiology. No further imaging at this time.   Acute urinary retention: This is likely multifactorial with diabetic cystopathy, uncontrolled diabetes and poor bladder function/voiding dysfunction comprising his largest issues. Has  hypertrophy of bladder wall on [place] as well. In the long term, he may require further studies or management with clean intermittent catheterization, chronic indwelling urethral catheter or even suprapubic tube if this continues to be an issue. Continue foley catheter at this time.   Serratia  UTI: Likely secondary to urinary stasis in the setting of voiding dysfunction in the setting of diabetes.  He has had prior positive urine cultures as well.  Optimizing voiding function will be important in the long-term.  Agree with management of current urinary tract infection with culture directed therapy.   Left renal mass: Previously noted on CT scan.  No mass-effect.  Recommendations: - Continue Foley catheter to gravity drainage. Do not remove during this admission. - Agree with treatment of Serratia UTI / bacteremia - He will require long-term outpatient management of his dysfunctional voiding/recurrent urinary retention.  May need to consider the role of UDS, as well as clean intermittent catheterization versus Foley versus suprapubic tube.

## 2018-12-17 NOTE — Progress Notes (Addendum)
Hospitalist progress note + chart summary  Timothy Madden [id number] DOB: 16-Sep-1960 DOA: 12/08/2018  PCP: Marliss Coots, NP   Narrative:  58 year old male DM TY 2 CKD 3 (2.5-2.7 creatinine) previous osteomyelitis recent DVT, psychiatric issues?  Homeless hypoechoic mass kidneys admit ICU 1128 obtunded in severe DKA-developed severe AKI creatinine 12 potassium 7-urology and nephrology saw-recommended Foley to gravity Rx urinary tract infection  Data Reviewed:  Potassium 2.9-->4.4  BUN/creatinine 70/4.6(on admission 182/12.9) albumin 1.7 calcium 7.5 Iron 18 TIBC 125 White count 21-->18 Assessment & Plan: DKA uncontrolled diabetes mellitus 15-DKA resolved -CBG 213-258  increased Levemir 10->15--> 20 +moderate sliding scale coverage  HTN, sinus tach-transition to metoprolol XL 12.5  Serratia UTI-Rocephin stop date 12/4 Leukocytosis-unclear etiology--Rx finished for above--no fever--OP work up--no other sources felt implicated Get CXR for completion ensure not aspiration related   AKI, hypokalemia (now resolved), hypocalcemia + hypomagnesemia, OP stalled past several days 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 Lia Foyer  Nephrostomies-likely can d/c with Foley--OP Uro f/u Fluids 0.45 50 cc/h Nephrology assistance appreciated  Diarrhea-2/2 supplements as it has decreased greatly since we have gone to once daily supplements-no more supplements--give extra diet  DVT 10/31-continue IV heparin--> eliquis 12/7--voucher to give as OP  Anemia of renal disease-iron levels are somewhat low TIBC is low-ESA per Renal given  Psychiatric disorder?  Some slowing of responses-reported in chart but no meds and also reported he was homeless-tried to call mother multiple x's and no response  Severe protein energy malnutrition BMI 13-supplement as needed-have cut back to once a day and stopped reglan--encourage diet as patient has probable  diarr from supplements  -transition to MedSurg-expect will be here several days needs skilled facility placement  No family avail--overall poor prognosis if kidney do not recover and in need of FOely--not safe for home discharge given slow cognition Called mother multiple times, no response --? SNF-suspect will be very hard to place remains inpatient, full code- eliquis  CHART REVIEW . 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  Leading to possible dysfunctional ureterovesicular junction . Admission for DKA 6/18 through 6/22-at that time found to have hypoechoic mass . Admission 10/16 through 11/02/2013 osteomyelitis left finger with amputation at that time  Subjective:  Awake-mainly monosyllabic--seems slow to respond [usual] Tells me spoke to mom-but then distracted by TV-unlear if he did nor not Full tray eaten bedside  Consultants:   uro  nephro Procedures:   n Antimicrobials:   Ceftriaxone till 12/4   Objective: Vitals:   12/16/18 1404 12/16/18 1953 12/17/18 0434 12/17/18 1354  BP: 114/73 119/75 126/80 (!) 88/51  Pulse: (!) 101 (!) 102 99 93  Resp: 18 18 17 20   Temp: 99.9 F (37.7 C) 98.6 F (37 C) 98 F (36.7 C) 97.8 F (36.6 C)  TempSrc: Oral Oral Oral Oral  SpO2: 99% 97% 97% 99%  Weight:      Height:        Intake/Output Summary (Last 24 hours) at 12/17/2018 1412 Last data filed at 12/17/2018 1300 Gross per 24 hour  Intake 960 ml  Output 2925 ml  Net -1965 ml   Filed Weights   12/08/18 1200 12/08/18 2050 12/09/18 0300  Weight: 86.9 kg 60.8 kg 50 kg    Examination:  Coherent emaciated monosyllabic-responses seem to take a few seconds to register and then he responds  no distress S1-S2 no murmur Abdomen soft Sitting in chair Moving all 4 limbs equally  Scheduled Meds: . apixaban  5 mg Oral BID  .  Chlorhexidine Gluconate Cloth  6 each Topical Q0600  . darbepoetin (ARANESP) injection - NON-DIALYSIS  60 mcg Subcutaneous Q Mon-1800  . insulin aspart  0-15 Units Subcutaneous TID WC  . insulin aspart  0-5 Units Subcutaneous QHS  . insulin detemir  15 Units Subcutaneous Q24H  . metoprolol succinate  12.5 mg Oral Daily  . pantoprazole  40 mg Oral QHS   Continuous Infusions: . sodium chloride 50 mL/hr at 12/17/18 1240  . sodium chloride 10 mL/hr at 12/15/18 1400     LOS: 9 days   Time spent: [place], MD [facility]  12/17/2018, 2:12 PM

## 2018-12-18 ENCOUNTER — Inpatient Hospital Stay (HOSPITAL_COMMUNITY): Payer: Medicaid Other

## 2018-12-18 LAB — RENAL FUNCTION PANEL
Albumin: 1.6 g/dL — ABNORMAL LOW (ref 3.5–5.0)
Anion gap: 13 (ref 5–15)
BUN: 70 mg/dL — ABNORMAL HIGH (ref 6–20)
CO2: 21 mmol/L — ABNORMAL LOW (ref 22–32)
Calcium: 8.3 mg/dL — ABNORMAL LOW (ref 8.9–10.3)
Chloride: 99 mmol/L (ref 98–111)
Creatinine, Ser: 4.64 mg/dL — ABNORMAL HIGH (ref 0.61–1.24)
GFR calc Af Amer: 15 mL/min — ABNORMAL LOW (ref >60)
GFR calc non Af Amer: 13 mL/min — ABNORMAL LOW (ref >60)
Glucose, Bld: 258 mg/dL — ABNORMAL HIGH (ref 70–99)
Phosphorus: 4.7 mg/dL — ABNORMAL HIGH (ref 2.5–4.6)
Potassium: 4.4 mmol/L (ref 3.5–5.1)
Sodium: 133 mmol/L — ABNORMAL LOW (ref 135–145)

## 2018-12-18 LAB — CBC
HCT: 24.6 % — ABNORMAL LOW (ref 39.0–52.0)
Hemoglobin: 7.7 g/dL — ABNORMAL LOW (ref 13.0–17.0)
MCH: 25.6 pg — ABNORMAL LOW (ref 26.0–34.0)
MCHC: 31.3 g/dL (ref 30.0–36.0)
MCV: 81.7 fL (ref 80.0–100.0)
Platelets: 371 10*3/uL (ref 150–400)
RBC: 3.01 MIL/uL — ABNORMAL LOW (ref 4.22–5.81)
RDW: 15.7 % — ABNORMAL HIGH (ref 11.5–15.5)
WBC: 18.1 10*3/uL — ABNORMAL HIGH (ref 4.0–10.5)
nRBC: 0 % (ref 0.0–0.2)

## 2018-12-18 LAB — GLUCOSE, CAPILLARY
Glucose-Capillary: 213 mg/dL — ABNORMAL HIGH (ref 70–99)
Glucose-Capillary: 278 mg/dL — ABNORMAL HIGH (ref 70–99)
Glucose-Capillary: 302 mg/dL — ABNORMAL HIGH (ref 70–99)
Glucose-Capillary: 299 mg/dL — ABNORMAL HIGH (ref 70–99)

## 2018-12-18 MED ORDER — JUVEN PO PACK
1.0000 | PACK | Freq: Two times a day (BID) | ORAL | Status: DC
  Administered 2018-12-18 – 2019-01-08 (×40): 1 via ORAL
  Filled 2018-12-18 (×43): qty 1

## 2018-12-18 MED ORDER — INSULIN DETEMIR 100 UNIT/ML ~~LOC~~ SOLN
20.0000 [IU] | SUBCUTANEOUS | Status: DC
  Administered 2018-12-18 – 2018-12-19 (×2): 20 [IU] via SUBCUTANEOUS
  Filled 2018-12-18 (×3): qty 0.2

## 2018-12-18 NOTE — Progress Notes (Signed)
Nutrition Follow-up  DOCUMENTATION CODES:   Underweight, Severe malnutrition in context of chronic illness  INTERVENTION:   -1 packet Juven BID, each packet provides 95 calories, 2.5 grams of protein (collagen), and 9.8 grams of carbohydrate (3 grams sugar); also contains 7 grams of L-arginine and L-glutamine, 300 mg vitamin C, 15 mg vitamin E, 1.2 mcg vitamin B-12, 9.5 mg zinc, 200 mg calcium, and 1.5 g  Calcium Beta-hydroxy-Beta-methylbutyrate to support wound healing -Double protein portions with meals  NUTRITION DIAGNOSIS:   Severe Malnutrition related to chronic illness(uncontrolled DM, suspected gastroparesis) as evidenced by severe muscle depletion, severe fat depletion.  Ongoing  GOAL:   Patient will meet greater than or equal to 90% of their needs  Progressing   MONITOR:   PO intake, Supplement acceptance, Diet advancement  REASON FOR ASSESSMENT:   Consult Assessment of nutrition requirement/status  ASSESSMENT:   58 yo male admitted with AMS r/t severe DKA/HHS. PMH includes poorly controlled DM, severe PCM, gastroparesis, numerous psychiatric issues.  12/2- advanced to dysphagia 1 diet with thin liquids 12/4- advanced to dysphagia 2 diet with thin liquids   Reviewed I/O's: -1.3 L x 24 hours and -3.9 L since admission  UOP: 2.6 L x 24 hours  Case discussed with RN, who reports that pt is down for x-ray. She confirms pt with good appetite. Noted meal completion 75-100%. Observed breakfast tray- pt consumed approximately 90% of meal.   Ensure supplements were d/c on 12/14/18; per chart review, pt with diarrhea which supplements were thought to be contributing.   Per nephrology notes, urine output is improving.   Plan for SNF placement once medically stable.   Labs reviewed: Na: 133, Phos: 4.7, CBGS: 213-264 (inpatient orders for glycemic control are 0-15 units insulin aspart TID with meals, 0-5 units insulin aspart q HS, and 20 units insulin detemir every 24  hours).   Diet Order:   Diet Order            DIET DYS 2 Room service appropriate? Yes; Fluid consistency: Thin  Diet effective now              EDUCATION NEEDS:   Not appropriate for education at this time  Skin:  Skin Assessment: Skin Integrity Issues: Skin Integrity Issues:: Diabetic Ulcer, Stage II Stage II: lt and rt sacrum Diabetic Ulcer: lt great toe  Last BM:  12/15/18  Height:   Ht Readings from Last 1 Encounters:  12/08/18 6\' 3"  (1.905 m)    Weight:   Wt Readings from Last 1 Encounters:  12/09/18 50 kg    Ideal Body Weight:  89.1 kg  BMI:  Body mass index is 13.78 kg/m.  Estimated Nutritional Needs:   Kcal:  2000-2200  Protein:  110-125 grams  Fluid:  > 2 L    Riya Huxford A. Jimmye Norman, RD, LDN, [place] Registered Dietitian II Certified Diabetes Care and Education Specialist Pager: [phone] After hours Pager: [phone]

## 2018-12-18 NOTE — Progress Notes (Signed)
Lost note replaced 12/7  Hospitalist progress note + chart summary  Timothy Madden [id number] DOB: 03/02/1960 DOA: 12/08/2018  PCP: Marliss Coots, NP     Narrative:  58 year old male DM TY 2 CKD 3 (2.5-2.7 creatinine) previous osteomyelitis recent DVT, psychiatric issues?  Homeless hypoechoic mass kidneys admit ICU 1128 obtunded in severe DKA-developed severe AKI creatinine 12 potassium 7-urology and nephrology saw-recommended Foley to gravity Rx urinary tract infection  Data Reviewed:  Potassium 2.9-->4.4  BUN/creatinine 72/4.6(on admission 182/12.9) albumin 1.7 calcium 7.5 Iron 18 TIBC 125 White count 21 Assessment & Plan: DKA uncontrolled diabetes mellitus 15-DKA resolved -CBG 11-260 increased Levemir 10->15 +moderate sliding scale coverage  HTN, sinus tach-transition to metoprolol XL 12.5  Serratia UTI-Rocephin stop date 12/4 Leukocytosis-unclear etiology--Rx finished for above--no fever--OP work up if persistent   AKI, hypokalemia (now resolved), hypocalcemia + hypomagnesemia,-now in polyuric phase,  -3.1 L  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 Lia Foyer  Nephrostomies-likely can d/c with Foley--OP Uro f/u Fluids 0.45 50 cc/h  Diarrhea-2/2 supplements as it has decreased greatly since we have gone to once daily supplements-no more supplements--give extra diet  DVT 10/31-continue IV heparin-change to eliquis today--voucher to give as OP  Anemia of renal disease-iron levels are somewhat low TIBC is low-ESA per Renal given  Psychiatric disorder-reported in chart but no meds and also reported he was homeless-tried to call mother multiple x's and no response  Severe protein energy malnutrition BMI 13-supplement as needed-have cut back to once a day and stopped reglan--encourage diet as patient has probable diarr from supplements  -transition to MedSurg-expect will be here several days needs skilled  facility placement  No family avail, have tried multiple times--? SNF remains inpatient, full code loveneox  CHART REVIEW  Admission 10/31 through 11/5 lower extremity swelling found to have DVT placed indwelling Foley catheter apparently 07/2018-was found to also have ectopic insertion of the ureters Lateral?  Leading to possible dysfunctional ureterovesicular junction  Admission for DKA 6/18 through 6/22-at that time found to have hypoechoic mass  Admission 10/16 through 11/02/2013 osteomyelitis left finger with amputation at that time  Subjective:  Coherent OOB  Walked with therapy No distress  Consultants:   uro  nephro Procedures:   n Antimicrobials:   Ceftriaxone till 12/4   Objective:       Vitals:   12/16/18 1404 12/16/18 1953 12/17/18 0434 12/17/18 1354  BP: 114/73 119/75 126/80 (!) 88/51  Pulse: (!) 101 (!) 102 99 93  Resp: 18 18 17 20   Temp: 99.9 F (37.7 C) 98.6 F (37 C) 98 F (36.7 C) 97.8 F (36.6 C)  TempSrc: Oral Oral Oral Oral  SpO2: 99% 97% 97% 99%  Weight:      Height:        Intake/Output Summary (Last 24 hours) at 12/17/2018 1412 Last data filed at 12/17/2018 1300 Gross per 24 hour  Intake 960 ml  Output 2925 ml  Net -1965 ml        Filed Weights   12/08/18 1200 12/08/18 2050 12/09/18 0300  Weight: 86.9 kg 60.8 kg 50 kg    Examination:  Emaciated - coherent  No focal deficit s1 s 2no m Foley in place abd soft nt nd  No le edema Neuro intact   Scheduled Meds: . Chlorhexidine Gluconate Cloth  6 each Topical Q0600  . darbepoetin (ARANESP) injection - NON-DIALYSIS  60 mcg Subcutaneous Q Mon-1800  . insulin aspart  0-15 Units Subcutaneous  TID WC  . insulin aspart  0-5 Units Subcutaneous QHS  . insulin detemir  15 Units Subcutaneous Q24H  . metoprolol succinate  12.5 mg Oral Daily  . pantoprazole  40 mg Oral QHS   Continuous Infusions: . sodium chloride 50 mL/hr at 12/17/18 1240  . sodium chloride  10 mL/hr at 12/15/18 1400  . heparin 1,500 Units/hr (12/17/18 0518)     LOS: 9 days   Time spent: [place], MD [facility]  12/17/2018, 2:12 PM

## 2018-12-18 NOTE — Progress Notes (Signed)
Patient ID: Timothy Madden, male   DOB: 14-Sep-1960, 58 y.o.   MRN: [id number] [facility] Progress Note   Assessment/ Plan:   1. Acute kidney Injury on chronic kidney disease stage IV: Baseline creatinine around 3.  Secondary to DKA and obstructive uropathy/bladder outlet obstruction.  Sluggishly improving renal function noted with decreasing urine output (now not polyuric).  Discontinue maintenance IV fluids at this time; I suspect that he has established a new lower renal baseline following recent acute kidney injury.  We will set him up for renal follow-up as an outpatient with [place] in 4 to 5 weeks ([facility] on my behalf). 2.  Diabetic ketoacidosis: Resolved.  Ongoing glycemic control per primary service, with significant social barriers to continued outpatient management. 3.  Severe sepsis secondary to Serratia urinary tract infection: Sepsis markers improving, off antibiotics at this time.  Chest x-ray report reviewed. 4.  Right leg deep venous thrombosis: Transition from intravenous heparin to Eliquis. 5.  History of psychiatric disorder: Complicated by difficult social situation/homeless status. 6.  Anemia: With low iron saturation but elevated ferritin limiting intravenous iron use.  Will give ESA.  Subjective:   Earlier today had chest x-ray suggestive of bilateral lower lobe infiltrates-afebrile and with improving leukocytosis.   Objective:   BP 98/71 (BP Location: Right Arm)   Pulse 85   Temp 98.3 F (36.8 C) (Oral)   Resp 15   Ht 6\' 3"  (1.905 m)   Wt 50 kg   SpO2 99%   BMI 13.78 kg/m   Intake/Output Summary (Last 24 hours) at 12/18/2018 1140 Last data filed at 12/18/2018 1032 Gross per 24 hour  Intake 1093 ml  Output 3250 ml  Net -2157 ml   Weight change:   Physical Exam: Gen: Appears comfortable resting in bed, interactive with minimal verbal responses CVS: Pulse regular rhythm, S1 and S2 normal Resp: Anteriorly clear to auscultation, no  rales/rhonchi Abd: Soft, flat, nontender Ext: No palpable lower extremity edema  Imaging: Dg Chest 2 View  Result Date: 12/18/2018 CLINICAL DATA:  Pneumonia. EXAM: CHEST - 2 VIEW COMPARISON:  November 30, 2018 FINDINGS: Bibasilar infiltrates are identified, primarily in the retrocardiac regions. The heart, hila, mediastinum, lungs, and pleura are otherwise unremarkable. IMPRESSION: Bilateral lower lobe infiltrates consistent with pneumonia given history. Recommend short-term follow-up imaging to ensure resolution. Electronically Signed   By: Dorise Bullion III M.D   On: 12/18/2018 11:01    Labs: BMET Recent Labs  Lab 12/12/18 0404 12/12/18 1835 12/13/18 0430 12/14/18 0148 12/15/18 0249 12/16/18 0324 12/17/18 0302 12/18/18 0345  NA 146* 139 139 137 136 137 136 133*  K 2.4* 2.7* 2.9* 4.1 4.0 4.1 4.4 4.4  CL 91* 86* 87* 91* 94* 97* 99 99  CO2 35* 37* 35* 32 29 27 23  21*  GLUCOSE 113* 221* 108* 219* 144* 113* 128* 258*  BUN 116* 101* 96* 92* 81* 77* 72* 70*  CREATININE 8.15* 7.06* 6.87* 6.00* 5.49* 4.89* 4.69* 4.64*  CALCIUM 6.9* 6.6* 6.4* 6.7* 7.5* 8.1* 8.2* 8.3*  PHOS 4.1  --  3.6 1.8* 2.7 3.7 4.7* 4.7*   CBC Recent Labs  Lab 12/14/18 0148 12/15/18 0249 12/16/18 0324 12/17/18 0302 12/18/18 0345  WBC 19.5* 21.0* 19.0* 20.0* 18.1*  NEUTROABS 16.5*  --   --   --   --   HGB 8.7* 8.9* 8.7* 8.5* 7.7*  HCT 26.8* 28.0* 27.2* 26.9* 24.6*  MCV 78.6* 79.8* 80.0 81.0 81.7  PLT 198 244 309 335  371    Medications:    . apixaban  5 mg Oral BID  . Chlorhexidine Gluconate Cloth  6 each Topical Q0600  . darbepoetin (ARANESP) injection - NON-DIALYSIS  60 mcg Subcutaneous Q Mon-1800  . insulin aspart  0-15 Units Subcutaneous TID WC  . insulin aspart  0-5 Units Subcutaneous QHS  . insulin detemir  20 Units Subcutaneous Q24H  . metoprolol succinate  12.5 mg Oral Daily  . nutrition supplement (JUVEN)  1 packet Oral BID BM  . pantoprazole  40 mg Oral QHS   Elmarie Shiley, MD 12/18/2018,  11:40 AM

## 2018-12-18 NOTE — Social Work (Signed)
CSW attempted to meet with pt at bedside.  Pt out of room per RN.   CSW continuing to follow for support with disposition when medically appropriate.  Westley Hummer, MSW, [place] [employer] [phone]

## 2018-12-18 NOTE — Plan of Care (Signed)
  Problem: Health Behavior/Discharge Planning: Goal: Ability to manage health-related needs will improve Outcome: Progressing   

## 2018-12-19 LAB — CBC WITH DIFFERENTIAL/PLATELET
Abs Immature Granulocytes: 0.31 10*3/uL — ABNORMAL HIGH (ref 0.00–0.07)
Basophils Absolute: 0.1 10*3/uL (ref 0.0–0.1)
Basophils Relative: 1 %
Eosinophils Absolute: 0.1 10*3/uL (ref 0.0–0.5)
Eosinophils Relative: 1 %
HCT: 25.3 % — ABNORMAL LOW (ref 39.0–52.0)
Hemoglobin: 8.1 g/dL — ABNORMAL LOW (ref 13.0–17.0)
Immature Granulocytes: 2 %
Lymphocytes Relative: 12 %
Lymphs Abs: 2.1 10*3/uL (ref 0.7–4.0)
MCH: 26 pg (ref 26.0–34.0)
MCHC: 32 g/dL (ref 30.0–36.0)
MCV: 81.1 fL (ref 80.0–100.0)
Monocytes Absolute: 0.9 10*3/uL (ref 0.1–1.0)
Monocytes Relative: 5 %
Neutro Abs: 14.2 10*3/uL — ABNORMAL HIGH (ref 1.7–7.7)
Neutrophils Relative %: 79 %
Platelets: 404 10*3/uL — ABNORMAL HIGH (ref 150–400)
RBC: 3.12 MIL/uL — ABNORMAL LOW (ref 4.22–5.81)
RDW: 15.8 % — ABNORMAL HIGH (ref 11.5–15.5)
WBC: 17.6 10*3/uL — ABNORMAL HIGH (ref 4.0–10.5)
nRBC: 0 % (ref 0.0–0.2)

## 2018-12-19 LAB — RENAL FUNCTION PANEL
Albumin: 1.7 g/dL — ABNORMAL LOW (ref 3.5–5.0)
Anion gap: 11 (ref 5–15)
BUN: 74 mg/dL — ABNORMAL HIGH (ref 6–20)
CO2: 22 mmol/L (ref 22–32)
Calcium: 8.6 mg/dL — ABNORMAL LOW (ref 8.9–10.3)
Chloride: 102 mmol/L (ref 98–111)
Creatinine, Ser: 4.68 mg/dL — ABNORMAL HIGH (ref 0.61–1.24)
GFR calc Af Amer: 15 mL/min — ABNORMAL LOW (ref >60)
GFR calc non Af Amer: 13 mL/min — ABNORMAL LOW (ref >60)
Glucose, Bld: 148 mg/dL — ABNORMAL HIGH (ref 70–99)
Phosphorus: 4.6 mg/dL (ref 2.5–4.6)
Potassium: 4.6 mmol/L (ref 3.5–5.1)
Sodium: 135 mmol/L (ref 135–145)

## 2018-12-19 LAB — GLUCOSE, CAPILLARY
Glucose-Capillary: 246 mg/dL — ABNORMAL HIGH (ref 70–99)
Glucose-Capillary: 326 mg/dL — ABNORMAL HIGH (ref 70–99)
Glucose-Capillary: 388 mg/dL — ABNORMAL HIGH (ref 70–99)
Glucose-Capillary: 152 mg/dL — ABNORMAL HIGH (ref 70–99)

## 2018-12-19 MED ORDER — INSULIN ASPART 100 UNIT/ML ~~LOC~~ SOLN
2.0000 [IU] | Freq: Three times a day (TID) | SUBCUTANEOUS | Status: DC
  Administered 2018-12-19: 2 [IU] via SUBCUTANEOUS

## 2018-12-19 MED ORDER — AMOXICILLIN-POT CLAVULANATE 500-125 MG PO TABS
1.0000 | ORAL_TABLET | Freq: Every day | ORAL | Status: DC
  Administered 2018-12-19 – 2018-12-20 (×2): 500 mg via ORAL
  Filled 2018-12-19 (×2): qty 1

## 2018-12-19 MED ORDER — AMOXICILLIN-POT CLAVULANATE 875-125 MG PO TABS: 1.0000 | ORAL_TABLET | Freq: Two times a day (BID) | ORAL | Status: DC

## 2018-12-19 NOTE — Progress Notes (Signed)
PROGRESS NOTE    Timothy Madden  TJQ:[id number] DOB: 12-Jan-1960 DOA: 12/08/2018 PCP: Marliss Coots, NP   Brief Narrative: 58 year old male with history of type 2 diabetes mellitus CKD stage III baseline creatinine 2.5-2.7, previous osteomyelitis, recent DVT,psychiatric issues?  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 admitted ICU 11/28 obtunded in severe DKA, developed severe AKI creatinine 12 potassium 7, seen by urology nephrology  Subjective: In Fetal position able to interact alert awake oriented. Denies any complaint. Nursing reports he's not much verbal today  Assessment & Plan:  DKA/Type 2 diabetes mellitus with stage 3 chronic kidney disease uncontrolled hyperglycemia, last hba1c 14.3 poorly controlled: Blood sugar uncontrolled 140s to 326.  Continue Levemir 20 units nightly, sliding scale at 2 units premeal insulin.  Monitor and adjust insulin, appreciate diabetes coordinator input  Serratia Sepsis and UTI: Blood cx + for Serratia: Antibiotic completed 12/4  Leukocytosis unclear etiology. afebrile. Monitor white counts.  Will need follow-up outpatient. Chest x-ray 12/8 shows bilateral lower lobe infiltrates consistent with a pneumonia: Patient finished ceftriaxone.  We will put him on Augmentin x5 days question aspiration  Essential hypertension: Bp stable continue metoprolol.  Acute renal failure superimposed on chronic kidney disease  IV: 2/2 DKA, obstructive uropathy, slow to improve.  Urine output decreasing number polyuric.  Nephrology following closely and signing off at this time suspecting this is his baseline.  Nephro plans to follow-up as outpatient. Recent Labs  Lab 12/15/18 0249 12/16/18 0324 12/17/18 0302 12/18/18 0345 12/19/18 0219  BUN 81* 77* 72* 70* 74*  CREATININE 5.49* 4.89* 4.69* 4.64* 4.68*   ?  Ectopic ureters/Bilateral hydronephrosis: seen by urology did ultrasound kidneys 12/3 repeat 2/4 and 12/5 was discussed with Dr. Claudia Desanctis- no nephrostomies,  likely can DC with Foley catheter outpatient follow-up with urology.  Right leg DVT 10/31: continue Eliquis-will need voucher for outpatient.  Anemia of chronic disease, ESA per nephrology.  ferritin high  History of psychiatric disorder complicated by difficult social situation/homeless status Education officer, museum on board.  Responding slowly but no meds.  Trying to get more history may need psychiatry consult.  Protein-calorie malnutrition, severe: Continue to augment nutrition, appreciate dietitian inpu  Metabolic acidosis-improved.  Diarrhea secondary to supplementation, now has decreased.  Nutrition: Nutrition Problem: Severe Malnutrition Etiology: chronic illness(uncontrolled DM, suspected gastroparesis) Signs/Symptoms: severe muscle depletion, severe fat depletion Interventions: Boost Breeze  Pressure Ulcer:  Pressure Injury 12/13/18 Sacrum Left Stage II -  Partial thickness loss of dermis presenting as a shallow open ulcer with a red, pink wound bed without slough. (Active)  12/13/18 0800  Location: Sacrum  Location Orientation: Left  Staging: Stage II -  Partial thickness loss of dermis presenting as a shallow open ulcer with a red, pink wound bed without slough.  Wound Description (Comments):   Present on Admission:      Pressure Injury 12/13/18 Sacrum Right Stage II -  Partial thickness loss of dermis presenting as a shallow open ulcer with a red, pink wound bed without slough. (Active)  12/13/18 0800  Location: Sacrum  Location Orientation: Right  Staging: Stage II -  Partial thickness loss of dermis presenting as a shallow open ulcer with a red, pink wound bed without slough.  Wound Description (Comments):   Present on Admission:    Body mass index is 13.78 kg/m.    DVT prophylaxis: eliquis Code Status: FULL Family Communication: plan of care discussed with patient , with RN Disposition Plan: Remains inpatient pending clinical improvement.  Need to skilled  nursing  facility.  Difficult disposition due to lack of insurance homelessness now with significant comorbidities   Consultants: Nephro, Uro  Procedures: Chart Review  Admission 10/31 through 11/5 lower extremity swelling found to have DVT placed indwelling Foley catheter apparently 07/2018-was found to also have ectopic insertion of the ureters Lateral?  Leading to possible dysfunctional ureterovesicular junction  Admission for DKA 6/18 through 6/22-at that time found to have hypoechoic mass  Admission 10/16 through 11/02/2013 osteomyelitis left finger with amputation at that time  Microbiology:  Antimicrobials: Anti-infectives (From admission, onward)   Start     Dose/Rate Route Frequency Ordered Stop   12/09/18 1200  ceFEPIme (MAXIPIME) 1 g in sodium chloride 0.9 % 100 mL IVPB  Status:  Discontinued     1 g 200 mL/hr over 30 Minutes Intravenous Every 24 hours 12/08/18 1452 12/09/18 0752   12/09/18 0900  cefTRIAXone (ROCEPHIN) 2 g in sodium chloride 0.9 % 100 mL IVPB  Status:  Discontinued     2 g 200 mL/hr over 30 Minutes Intravenous Every 24 hours 12/09/18 0752 12/14/18 1302   12/08/18 1600  azithromycin (ZITHROMAX) 500 mg in sodium chloride 0.9 % 250 mL IVPB  Status:  Discontinued     500 mg 250 mL/hr over 60 Minutes Intravenous Every 24 hours 12/08/18 1508 12/09/18 0752   12/08/18 1452  vancomycin variable dose per unstable renal function (pharmacist dosing)  Status:  Discontinued      Does not apply See admin instructions 12/08/18 1452 12/09/18 0752   12/08/18 1100  vancomycin (VANCOCIN) 2,000 mg in sodium chloride 0.9 % 500 mL IVPB     2,000 mg 250 mL/hr over 120 Minutes Intravenous  Once 12/08/18 1050 12/08/18 1433   12/08/18 1045  ceFEPIme (MAXIPIME) 2 g in sodium chloride 0.9 % 100 mL IVPB     2 g 200 mL/hr over 30 Minutes Intravenous  Once 12/08/18 1031 12/08/18 1241   12/08/18 1045  metroNIDAZOLE (FLAGYL) IVPB 500 mg     500 mg 100 mL/hr over 60 Minutes Intravenous  Once  12/08/18 1031 12/08/18 1326   12/08/18 1045  vancomycin (VANCOCIN) IVPB 1000 mg/200 mL premix  Status:  Discontinued     1,000 mg 200 mL/hr over 60 Minutes Intravenous  Once 12/08/18 1031 12/08/18 1035       Objective: Vitals:   12/18/18 0518 12/18/18 1534 12/18/18 2055 12/19/18 0409  BP: 98/71 93/66 (!) 91/55 121/71  Pulse: 85 86 94 87  Resp: 15 17 16 16   Temp: 98.3 F (36.8 C) 97.9 F (36.6 C) 98 F (36.7 C) 97.6 F (36.4 C)  TempSrc: Oral Oral Oral Oral  SpO2: 99% 98% 99% 100%  Weight:      Height:        Intake/Output Summary (Last 24 hours) at 12/19/2018 1241 Last data filed at 12/19/2018 1203 Gross per 24 hour  Intake 836 ml  Output 3350 ml  Net -2514 ml   Filed Weights   12/08/18 1200 12/08/18 2050 12/09/18 0300  Weight: 86.9 kg 60.8 kg 50 kg   Weight change:   Body mass index is 13.78 kg/m.  Intake/Output from previous day: 12/08 0701 - 12/09 0700 In: 720 [P.O.:720] Out: 3850 [Urine:3850] Intake/Output this shift: Total I/O In: 356 [P.O.:356] Out: 850 [Urine:850]  Examination:  General exam: AAO,NAD, Weak appearing. HEENT:Oral mucosa moist, Ear/Nose WNL grossly, dentition normal. Respiratory system: Diminished at the base,no wheezing or crackles,no use of accessory muscle Cardiovascular system: S1 & S2 +, No  JVD,. Gastrointestinal system: Abdomen soft, NT,ND, BS+ Nervous System:Alert, awake, moving extremities and grossly nonfocal Extremities: No edema, distal peripheral pulses palpable.  Skin: No rashes,no icterus. MSK: Normal muscle bulk,tone, power  Medications:  Scheduled Meds: . apixaban  5 mg Oral BID  . Chlorhexidine Gluconate Cloth  6 each Topical Q0600  . darbepoetin (ARANESP) injection - NON-DIALYSIS  60 mcg Subcutaneous Q Mon-1800  . insulin aspart  0-15 Units Subcutaneous TID WC  . insulin aspart  0-5 Units Subcutaneous QHS  . insulin detemir  20 Units Subcutaneous Q24H  . metoprolol succinate  12.5 mg Oral Daily  . nutrition  supplement (JUVEN)  1 packet Oral BID BM  . pantoprazole  40 mg Oral QHS   Continuous Infusions: . sodium chloride 10 mL/hr at 12/15/18 1400    Data Reviewed: I have personally reviewed following labs and imaging studies  CBC: Recent Labs  Lab 12/14/18 0148 12/15/18 0249 12/16/18 0324 12/17/18 0302 12/18/18 0345 12/19/18 0219  WBC 19.5* 21.0* 19.0* 20.0* 18.1* 17.6*  NEUTROABS 16.5*  --   --   --   --  14.2*  HGB 8.7* 8.9* 8.7* 8.5* 7.7* 8.1*  HCT 26.8* 28.0* 27.2* 26.9* 24.6* 25.3*  MCV 78.6* 79.8* 80.0 81.0 81.7 81.1  PLT 198 244 309 335 371 967*   Basic Metabolic Panel: Recent Labs  Lab 12/13/18 1050 12/14/18 0148 12/15/18 0249 12/16/18 0324 12/17/18 0302 12/18/18 0345 12/19/18 0219  NA  --  137 136 137 136 133* 135  K  --  4.1 4.0 4.1 4.4 4.4 4.6  CL  --  91* 94* 97* 99 99 102  CO2  --  32 29 27 23  21* 22  GLUCOSE  --  219* 144* 113* 128* 258* 148*  BUN  --  92* 81* 77* 72* 70* 74*  CREATININE  --  6.00* 5.49* 4.89* 4.69* 4.64* 4.68*  CALCIUM  --  6.7* 7.5* 8.1* 8.2* 8.3* 8.6*  MG 1.4* 1.6*  --   --   --   --   --   PHOS  --  1.8* 2.7 3.7 4.7* 4.7* 4.6   GFR: Estimated Creatinine Clearance: 12.3 mL/min (A) (by C-G formula based on SCr of 4.68 mg/dL (H)). Liver Function Tests: Recent Labs  Lab 12/15/18 0249 12/16/18 0324 12/17/18 0302 12/18/18 0345 12/19/18 0219  ALBUMIN 1.7* 1.7* 1.6* 1.6* 1.7*   No results for input(s): LIPASE, AMYLASE in the last 168 hours. No results for input(s): AMMONIA in the last 168 hours. Coagulation Profile: No results for input(s): INR, PROTIME in the last 168 hours. Cardiac Enzymes: No results for input(s): CKTOTAL, CKMB, CKMBINDEX, TROPONINI in the last 168 hours. BNP (last 3 results) No results for input(s): PROBNP in the last 8760 hours. HbA1C: No results for input(s): HGBA1C in the last 72 hours. CBG: Recent Labs  Lab 12/18/18 1249 12/18/18 1705 12/18/18 2051 12/19/18 0828 12/19/18 1159  GLUCAP 278* 302*  299* 246* 326*   Lipid Profile: No results for input(s): CHOL, HDL, LDLCALC, TRIG, CHOLHDL, LDLDIRECT in the last 72 hours. Thyroid Function Tests: No results for input(s): TSH, T4TOTAL, FREET4, T3FREE, THYROIDAB in the last 72 hours. Anemia Panel: No results for input(s): VITAMINB12, FOLATE, FERRITIN, TIBC, IRON, RETICCTPCT in the last 72 hours. Sepsis Labs: No results for input(s): PROCALCITON, LATICACIDVEN in the last 168 hours.  No results found for this or any previous visit (from the past 240 hour(s)).    Radiology Studies: Dg Chest 2 View  Result Date:  12/18/2018 CLINICAL DATA:  Pneumonia. EXAM: CHEST - 2 VIEW COMPARISON:  November 30, 2018 FINDINGS: Bibasilar infiltrates are identified, primarily in the retrocardiac regions. The heart, hila, mediastinum, lungs, and pleura are otherwise unremarkable. IMPRESSION: Bilateral lower lobe infiltrates consistent with pneumonia given history. Recommend short-term follow-up imaging to ensure resolution. Electronically Signed   By: Dorise Bullion III M.D   On: 12/18/2018 11:01      LOS: 11 days   Time spent: More than 50% of that time was spent in counseling and/or coordination of care.  Antonieta Pert, MD [facility]  12/19/2018, 12:41 PM

## 2018-12-19 NOTE — Progress Notes (Signed)
Inpatient Diabetes Program Recommendations  AACE/ADA: New Consensus Statement on Inpatient Glycemic Control (2015)  Target Ranges:  Prepandial:   less than 140 mg/dL      Peak postprandial:   less than 180 mg/dL (1-2 hours)      Critically ill patients:  140 - 180 mg/dL   Lab Results  Component Value Date   GLUCAP 246 (H) 12/19/2018   HGBA1C 14.3 (H) 11/10/2018    Review of Glycemic Control  Results for FROILAN, MCLEAN (MRN [id number]) as of 12/19/2018 10:23  Ref. Range 12/18/2018 08:19 12/18/2018 12:49 12/18/2018 17:05 12/18/2018 20:51 12/19/2018 08:28  Glucose-Capillary Latest Ref Range: 70 - 99 mg/dL 213 (H) 278 (H) 302 (H) 299 (H) 246 (H)    Diabetes history: DM2 Outpatient Diabetes medications: Levemir 20 units QD; Novolog 0-15 TID with meals and 0-5 HS Current orders for Inpatient glycemic control: Levemir 20 units QD; Novolog 0-15 TID with meals and 0-5 HS   Inpatient Diabetes Program Recommendations:  -Please consider adding Novolog 2 units for meal coverage if eats at least 50%  Thank you, Geoffry Paradise, RN, BSN Diabetes Coordinator Inpatient Diabetes Program [phone] (team pager from 8a-5p)

## 2018-12-19 NOTE — Progress Notes (Signed)
  Speech Language Pathology Treatment: Dysphagia  Patient Details Name: Timothy Madden MRN: [id number] DOB: Oct 26, 1960 Today's Date: 12/19/2018 Time: 1100-1110 SLP Time Calculation (min) (ACUTE ONLY): 10 min  Assessment / Plan / Recommendation Clinical Impression  Pt has swifter oral preparation compared to this SLP's last visit. He masticates soft solids well despite missing dentition, and although his rate isn't particularly fast, he does put more food in his mouth before swallowing what he already has in there completely. He paces himself well enough that this does not seem to impact his safety in a significant way. There are no overt s/s of aspiration and he intermittently uses a liquid wash. When asked if he eats more solid food normally he said "yea." Recommend advancement to Dys 3 solids, thin liquids. If he tolerates this well then we will likely sign off soon.   HPI HPI: 58 yo male admitted with AMS r/t severe DKA/HHS. PMH includes poorly controlled DM, severe PCM, gastroparesis, numerous psychiatric issues.       SLP Plan  Continue with current plan of care       Recommendations  Diet recommendations: Dysphagia 3 (mechanical soft);Thin liquid Liquids provided via: Cup;Straw Medication Administration: Whole meds with puree Supervision: Patient able to self feed;Intermittent supervision to cue for compensatory strategies Compensations: Minimize environmental distractions Postural Changes and/or Swallow Maneuvers: Seated upright 90 degrees;Upright 30-60 min after meal                Oral Care Recommendations: Oral care BID Follow up Recommendations: 24 hour supervision/assistance SLP Visit Diagnosis: Dysphagia, oral phase (R13.11) Plan: Continue with current plan of care       GO                Venita Sheffield Zakk Borgen 12/19/2018, 12:17 PM  Pollyann Glen, M.A. [place] Acute Environmental education officer [phone] Office [phone]

## 2018-12-19 NOTE — Progress Notes (Signed)
Physical Therapy Treatment Patient Details Name: Timothy Madden MRN: [id number] DOB: [birth year] Today's Date: 12/19/2018    History of Present Illness This is a 58 year old man with a history of numerous psychiatric issues, diabetes poorly controlled supposed to be on home insulin presenting with obtundation from home found to be in severe DKA/HOCM.  PMH DKA, DM, L second finger amputation.    PT Comments    Continuing work on functional mobility and activity tolerance;  Showing solid progress with bed mobility, transfers and amb;  Better initiation of mobiltiy when he has an inherent need to get up (in this case, get to the bathroom to move bowels); Able to progress amb distance as well  Follow Up Recommendations  SNF     Equipment Recommendations  Rolling walker with 5" wheels    Recommendations for Other Services       Precautions / Restrictions Precautions Precautions: Fall    Mobility  Bed Mobility Overal bed mobility: Needs Assistance Bed Mobility: Supine to Sit     Supine to sit: Min assist     General bed mobility comments: Assist to manage bedclothes, and untangle lines; Assist to help LEs off of the bed, and min handheld assist to pull to sit; very slow moving; needing less assist to place feet flat on teh floor in prep for standing  Transfers Overall transfer level: Needs assistance Equipment used: Rolling walker (2 wheeled) Transfers: Sit to/from Stand Sit to Stand: Min assist         General transfer comment: Cues for hand placement, including using grab bar in sitting to commode in bathroom  Ambulation/Gait Ambulation/Gait assistance: Min assist;Min guard;+2 safety/equipment Gait Distance (Feet): 60 Feet Assistive device: Rolling walker (2 wheeled) Gait Pattern/deviations: Decreased step length - right;Decreased step length - left;Decreased stride length Gait velocity: slow   General Gait Details: Slow steps and good use of RW to steady; minguard  overall for safety   Stairs             Wheelchair Mobility    Modified Rankin (Stroke Patients Only)       Balance     Sitting balance-Leahy Scale: Fair       Standing balance-Leahy Scale: Poor                              Cognition Arousal/Alertness: Awake/alert Behavior During Therapy: Flat affect Overall Cognitive Status: No family/caregiver present to determine baseline cognitive functioning                                 General Comments: Overall agreeing to move and walk the hallway; midway through walk, he began to get quite talkative, not quite related to the task at hand, but also voiced some displeasure with what we were doing      Exercises      General Comments General comments (skin integrity, edema, etc.): Noted area of decr skin integrity near anal opening while assisting pt with hygeine post BM; notified RN      Pertinent Vitals/Pain Pain Assessment: Faces Faces Pain Scale: Hurts a little bit Pain Location: Grimace during hygeine after BM Pain Descriptors / Indicators: Grimacing Pain Intervention(s): Monitored during session(Notified RN)    Home Living                      Prior Function  PT Goals (current goals can now be found in the care plan section) Acute Rehab PT Goals Patient Stated Goal: Did not state, but agreeable to getting OOB PT Goal Formulation: With patient Time For Goal Achievement: 12/24/18 Potential to Achieve Goals: Fair Progress towards PT goals: Progressing toward goals    Frequency    Min 2X/week      PT Plan Current plan remains appropriate    Co-evaluation              AM-PAC PT "6 Clicks" Mobility   Outcome Measure  Help needed turning from your back to your side while in a flat bed without using bedrails?: A Lot Help needed moving from lying on your back to sitting on the side of a flat bed without using bedrails?: A Little Help needed moving  to and from a bed to a chair (including a wheelchair)?: A Little Help needed standing up from a chair using your arms (e.g., wheelchair or bedside chair)?: A Little Help needed to walk in hospital room?: A Little Help needed climbing 3-5 steps with a railing? : A Lot 6 Click Score: 16    End of Session Equipment Utilized During Treatment: Gait belt Activity Tolerance: Patient tolerated treatment well Patient left: in chair;with call bell/phone within reach;with chair alarm set Nurse Communication: Mobility status;Other (comment)(asked to assess skin near anal opening) PT Visit Diagnosis: Other abnormalities of gait and mobility (R26.89);Muscle weakness (generalized) (M62.81);Other symptoms and signs involving the nervous system (R29.898)     Time: 1223-1248(minus approx 5 min while pt was on commode) PT Time Calculation (min) (ACUTE ONLY): 25 min  Charges:  $Gait Training: 8-22 mins                     Roney Marion, [place]  Acute Rehabilitation Services Pager [phone] Office [place] 12/19/2018, 3:50 PM

## 2018-12-19 NOTE — TOC Initial Note (Signed)
Transition of Care [facility]) - Initial/Assessment Note    Patient Details  Name: Timothy Madden MRN: [id number] Date of Birth: 1960/10/22  Transition of Care [facility]) CM/SW Contact:    Alexander [place], [place] Phone Number: 12/19/2018, 4:10 PM  Clinical Narrative:                 CSW spoke with pt at bedside. Introduced self, role, reason for visit. Pt curled up on bed with arms crossed. Focused only on tv until CSW asked to turn it off- still difficult to engage. Pt confirmed address on facesheet and states he lives with his mother there. He gets his medication and primary care at [facility]- admits he does not keep track of them. Admits to issues with obtaining medications. Pt has not applied for Medicaid. He nods yes when asked if he is amenable to rehab placement.  Pt gives permission to reach out to his mother but unable to give me an active number. CSW has attempted [phone] multiple times with no success- during fourth attempt someone answered phone, yelled "shut up" and hung up. HIPAA compliant message also left for Marliss Coots PCP.  Since CSW unable to reach family and pt unable to provide much further information CSW has requested wellness check by [employer] to address listed as home.  TOC team continue to follow. Will fax out- difficult placement with no insurance.   Expected Discharge Plan: [place] Barriers to Discharge: Inadequate or no insurance, No SNF bed   Patient Goals and CMS Choice Patient states their goals for this hospitalization and ongoing recovery are:: to get stronger CMS Medicare.gov Compare Post Acute Care list provided to:: (n/a) Choice offered to / list presented to : Patient  Expected Discharge Plan and Services Expected Discharge Plan: [place] In-house Referral: Clinical Social Work Discharge Planning Services: CM Consult, [facility], Medication Assistance Post Acute Care Choice: [place] Living  arrangements for the past 2 months: Single Family Home    Prior Living Arrangements/Services Living arrangements for the past 2 months: Single Family Home Lives with:: Parents Patient language and need for interpreter reviewed:: Yes Do you feel safe going back to the place where you live?: Yes      Need for Family Participation in Patient Care: Yes (Comment)(assistance with care) Care giver support system in place?: Yes (comment)(pt mother (havent gotten in contact with her at this time)) Current home services: DME(chronic foley) Criminal Activity/Legal Involvement Pertinent to Current Situation/Hospitalization: No - Comment as needed  Activities of Daily Living Home Assistive Devices/Equipment: None ADL Screening (condition at time of admission) Patient's cognitive ability adequate to safely complete daily activities?: Yes Is the patient deaf or have difficulty hearing?: No Does the patient have difficulty seeing, even when wearing glasses/contacts?: No Does the patient have difficulty concentrating, remembering, or making decisions?: No Patient able to express need for assistance with ADLs?: Yes Does the patient have difficulty dressing or bathing?: No Independently performs ADLs?: Yes (appropriate for developmental age) Does the patient have difficulty walking or climbing stairs?: Yes Weakness of Legs: Both Weakness of Arms/Hands: None  Permission Sought/Granted Permission sought to share information with : Family Supports, PCP Permission granted to share information with : Yes, Verbal Permission Granted  Share Information with NAME: Pecoila Degregory  Permission granted to share info w AGENCY: [facility] PCP  Permission granted to share info w Relationship: mother  Permission granted to share info w Contact Information: [phone]  Emotional Assessment Appearance::  Appears older than stated age Attitude/Demeanor/Rapport: Guarded, Inconsistent Affect (typically observed):  Guarded, Flat, Quiet Orientation: : Oriented to Self, Oriented to Place, Oriented to Situation, Fluctuating Orientation (Suspected and/or reported Sundowners) Alcohol / Substance Use: Tobacco Use Psych Involvement: Yes (comment)(previous documented hx)  Admission diagnosis:  Hyperkalemia [E87.5] Pyelonephritis [N12] Hyperglycemia [R73.9] Sepsis, due to unspecified organism, unspecified whether acute organ dysfunction present [facility]) [A41.9] Patient Active Problem List   Diagnosis Date Noted  . Pressure injury of skin 12/15/2018  . Protein-calorie malnutrition, severe 12/11/2018  . Acute lower UTI 12/11/2018  . Bacteremia due to Gram-negative bacteria 12/11/2018  . Bilateral hydronephrosis 12/11/2018  . DVT (deep venous thrombosis) ([place]) 12/11/2018  . Metabolic acidosis 02/40/9735  . Anemia due to chronic kidney disease 12/11/2018  . DKA, type 1, not at goal [facility]) 12/08/2018  . 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 Acute stress disorder 11/14/2018  . Leg swelling 11/10/2018  . DKA, type 2 ([place]) 06/28/2018  . Acute renal failure superimposed on chronic kidney disease ([place]) 06/28/2018  . Elevated lipase 06/28/2018  . Osteomyelitis of finger of left hand ([place]) 10/26/2013  . Osteomyelitis ([place]) 10/26/2013  . Sepsis ([place]) 10/26/2013  . AKI (acute kidney injury) ([place]) 10/26/2013  . Hyponatremia 10/26/2013  . Type I diabetes mellitus with complication, uncontrolled ([place]) 10/26/2013  . Essential hypertension 10/26/2013  . Anemia of chronic disease 10/26/2013  . PSYCHIATRIC DISORDER 07/19/2006  . TOBACCO ABUSE 07/19/2006  . Type 2 diabetes mellitus with stage 3 chronic kidney disease ([place]) 11/30/2005  . ANXIETY 11/30/2005   PCP:  Marliss Coots, NP Pharmacy:   [facility] [place]), [place] - [address] [place] ([place])  [address] Phone: [phone] Fax: [place], [place] - [address] [place] [address] Phone: [phone] Fax: [phone]    Readmission Risk Interventions Readmission Risk Prevention Plan 11/14/2018  Transportation Screening Complete  PCP or Specialist Appt within 3-5 Days Not Complete  Not Complete comments Clinics to which patient goes do not make appointments-it is walk in.  [place] or Home Care Consult Complete  Social Work Consult for [place] Planning/Counseling Complete  Palliative Care Screening Not Applicable  Medication Review (RN Care Manager) Referral to Pharmacy  Some recent data might be hidden

## 2018-12-19 NOTE — Progress Notes (Signed)
Patient ID: Timothy Madden, male   DOB: 08-08-1960, 58 y.o.   MRN: [id number] [facility] Progress Note   Assessment/ Plan:   1. Acute kidney Injury on chronic kidney disease stage IV: Baseline creatinine around 3.  Secondary to DKA and obstructive uropathy/bladder outlet obstruction.  Sluggishly improving renal function noted with decreasing urine output (now not polyuric).  He has likely had progression of his kidney disease following recent AKI/obstruction event and we will follow him as an outpatient upon discharge-him/mother will be communicated with details regarding this appointment. 2.  Diabetic ketoacidosis: Resolved.  Ongoing glycemic control per primary service, with significant social barriers to continued outpatient management. 3.  Severe sepsis secondary to [place] urinary tract infection: Improved sepsis markers and symptomatically better status post completion of antibiotics. 4.  Right leg deep venous thrombosis: Transition from intravenous heparin to Eliquis. 5.  History of psychiatric disorder: Complicated by difficult social situation/homeless status. 6.  Anemia: With low iron saturation but elevated ferritin limiting intravenous iron use.  Will give ESA.  I will sign off at this time, please call with questions or concerns.  Subjective:   Denies any acute complaints, denies chest pain or shortness of breath..   Objective:   BP 121/71 (BP Location: Left Arm)   Pulse 87   Temp 97.6 F (36.4 C) (Oral)   Resp 16   Ht 6\' 3"  (1.905 m)   Wt 50 kg   SpO2 100%   BMI 13.78 kg/m   Intake/Output Summary (Last 24 hours) at 12/19/2018 1143 Last data filed at 12/19/2018 3976 Gross per 24 hour  Intake 836 ml  Output 2500 ml  Net -1664 ml   Weight change:   Physical Exam: Gen: Sleeping comfortably in bed, awakens easily and responds to questions CVS: Pulse regular rhythm, S1 and S2 normal Resp: Anteriorly clear to auscultation, no rales/rhonchi Abd: Soft,  flat, nontender Ext: No palpable lower extremity edema  Imaging: Dg Chest 2 View  Result Date: 12/18/2018 CLINICAL DATA:  Pneumonia. EXAM: CHEST - 2 VIEW COMPARISON:  November 30, 2018 FINDINGS: Bibasilar infiltrates are identified, primarily in the retrocardiac regions. The heart, hila, mediastinum, lungs, and pleura are otherwise unremarkable. IMPRESSION: Bilateral lower lobe infiltrates consistent with pneumonia given history. Recommend short-term follow-up imaging to ensure resolution. Electronically Signed   By: Dorise Bullion III M.D   On: 12/18/2018 11:01    Labs: BMET Recent Labs  Lab 12/13/18 0430 12/14/18 0148 12/15/18 0249 12/16/18 0324 12/17/18 0302 12/18/18 0345 12/19/18 0219  NA 139 137 136 137 136 133* 135  K 2.9* 4.1 4.0 4.1 4.4 4.4 4.6  CL 87* 91* 94* 97* 99 99 102  CO2 35* 32 29 27 23  21* 22  GLUCOSE 108* 219* 144* 113* 128* 258* 148*  BUN 96* 92* 81* 77* 72* 70* 74*  CREATININE 6.87* 6.00* 5.49* 4.89* 4.69* 4.64* 4.68*  CALCIUM 6.4* 6.7* 7.5* 8.1* 8.2* 8.3* 8.6*  PHOS 3.6 1.8* 2.7 3.7 4.7* 4.7* 4.6   CBC Recent Labs  Lab 12/14/18 0148  12/16/18 0324 12/17/18 0302 12/18/18 0345 12/19/18 0219  WBC 19.5*   < > 19.0* 20.0* 18.1* 17.6*  NEUTROABS 16.5*  --   --   --   --  14.2*  HGB 8.7*   < > 8.7* 8.5* 7.7* 8.1*  HCT 26.8*   < > 27.2* 26.9* 24.6* 25.3*  MCV 78.6*   < > 80.0 81.0 81.7 81.1  PLT 198   < > 309 335  371 404*   < > = values in this interval not displayed.    Medications:    . apixaban  5 mg Oral BID  . Chlorhexidine Gluconate Cloth  6 each Topical Q0600  . darbepoetin (ARANESP) injection - NON-DIALYSIS  60 mcg Subcutaneous Q Mon-1800  . insulin aspart  0-15 Units Subcutaneous TID WC  . insulin aspart  0-5 Units Subcutaneous QHS  . insulin detemir  20 Units Subcutaneous Q24H  . metoprolol succinate  12.5 mg Oral Daily  . nutrition supplement (JUVEN)  1 packet Oral BID BM  . pantoprazole  40 mg Oral QHS   Elmarie Shiley, MD 12/19/2018,  11:43 AM

## 2018-12-20 LAB — PROCALCITONIN: Procalcitonin: 0.78 ng/mL

## 2018-12-20 LAB — RENAL FUNCTION PANEL
Albumin: 1.8 g/dL — ABNORMAL LOW (ref 3.5–5.0)
Anion gap: 13 (ref 5–15)
BUN: 95 mg/dL — ABNORMAL HIGH (ref 6–20)
CO2: 20 mmol/L — ABNORMAL LOW (ref 22–32)
Calcium: 8.5 mg/dL — ABNORMAL LOW (ref 8.9–10.3)
Chloride: 99 mmol/L (ref 98–111)
Creatinine, Ser: 4.58 mg/dL — ABNORMAL HIGH (ref 0.61–1.24)
GFR calc Af Amer: 15 mL/min — ABNORMAL LOW (ref >60)
GFR calc non Af Amer: 13 mL/min — ABNORMAL LOW (ref >60)
Glucose, Bld: 238 mg/dL — ABNORMAL HIGH (ref 70–99)
Phosphorus: 4.4 mg/dL (ref 2.5–4.6)
Potassium: 4.8 mmol/L (ref 3.5–5.1)
Sodium: 132 mmol/L — ABNORMAL LOW (ref 135–145)

## 2018-12-20 LAB — BASIC METABOLIC PANEL
Anion gap: 11 (ref 5–15)
BUN: 93 mg/dL — ABNORMAL HIGH (ref 6–20)
CO2: 19 mmol/L — ABNORMAL LOW (ref 22–32)
Calcium: 8.1 mg/dL — ABNORMAL LOW (ref 8.9–10.3)
Chloride: 103 mmol/L (ref 98–111)
Creatinine, Ser: 4.39 mg/dL — ABNORMAL HIGH (ref 0.61–1.24)
GFR calc Af Amer: 16 mL/min — ABNORMAL LOW (ref >60)
GFR calc non Af Amer: 14 mL/min — ABNORMAL LOW (ref >60)
Glucose, Bld: 525 mg/dL (ref 70–99)
Potassium: 5.2 mmol/L — ABNORMAL HIGH (ref 3.5–5.1)
Sodium: 133 mmol/L — ABNORMAL LOW (ref 135–145)

## 2018-12-20 LAB — CBC
HCT: 26.3 % — ABNORMAL LOW (ref 39.0–52.0)
Hemoglobin: 8.1 g/dL — ABNORMAL LOW (ref 13.0–17.0)
MCH: 26 pg (ref 26.0–34.0)
MCHC: 30.8 g/dL (ref 30.0–36.0)
MCV: 84.6 fL (ref 80.0–100.0)
Platelets: 441 10*3/uL — ABNORMAL HIGH (ref 150–400)
RBC: 3.11 MIL/uL — ABNORMAL LOW (ref 4.22–5.81)
RDW: 15.9 % — ABNORMAL HIGH (ref 11.5–15.5)
WBC: 16.6 10*3/uL — ABNORMAL HIGH (ref 4.0–10.5)
nRBC: 0 % (ref 0.0–0.2)

## 2018-12-20 LAB — GLUCOSE, CAPILLARY
Glucose-Capillary: 151 mg/dL — ABNORMAL HIGH (ref 70–99)
Glucose-Capillary: 213 mg/dL — ABNORMAL HIGH (ref 70–99)
Glucose-Capillary: 277 mg/dL — ABNORMAL HIGH (ref 70–99)
Glucose-Capillary: 293 mg/dL — ABNORMAL HIGH (ref 70–99)
Glucose-Capillary: 487 mg/dL — ABNORMAL HIGH (ref 70–99)
Glucose-Capillary: 505 mg/dL (ref 70–99)

## 2018-12-20 LAB — LACTIC ACID, PLASMA
Lactic Acid, Venous: 2 mmol/L (ref 0.5–1.9)
Lactic Acid, Venous: 2 mmol/L (ref 0.5–1.9)

## 2018-12-20 MED ORDER — SODIUM CHLORIDE 0.9 % IV BOLUS
1000.0000 mL | Freq: Once | INTRAVENOUS | Status: AC
  Administered 2018-12-20: 1000 mL via INTRAVENOUS

## 2018-12-20 MED ORDER — SODIUM CHLORIDE 0.9 % IV BOLUS
500.0000 mL | Freq: Once | INTRAVENOUS | Status: AC
  Administered 2018-12-20: 500 mL via INTRAVENOUS

## 2018-12-20 MED ORDER — INSULIN GLARGINE 100 UNIT/ML ~~LOC~~ SOLN
10.0000 [IU] | Freq: Once | SUBCUTANEOUS | Status: AC
  Administered 2018-12-20: 10 [IU] via SUBCUTANEOUS
  Filled 2018-12-20: qty 0.1

## 2018-12-20 MED ORDER — SODIUM CHLORIDE 0.9 % IV BOLUS: 500.0000 mL | Freq: Once | INTRAVENOUS | Status: DC

## 2018-12-20 MED ORDER — INSULIN ASPART 100 UNIT/ML ~~LOC~~ SOLN
5.0000 [IU] | Freq: Three times a day (TID) | SUBCUTANEOUS | Status: DC
  Administered 2018-12-20: 5 [IU] via SUBCUTANEOUS

## 2018-12-20 MED ORDER — INSULIN ASPART 100 UNIT/ML ~~LOC~~ SOLN
0.0000 [IU] | SUBCUTANEOUS | Status: DC
  Administered 2018-12-20: 8 [IU] via SUBCUTANEOUS
  Administered 2018-12-20: 5 [IU] via SUBCUTANEOUS
  Administered 2018-12-21: 3 [IU] via SUBCUTANEOUS

## 2018-12-20 MED ORDER — PIPERACILLIN-TAZOBACTAM 3.375 G IVPB
3.3750 g | Freq: Two times a day (BID) | INTRAVENOUS | Status: DC
  Administered 2018-12-20 – 2018-12-27 (×14): 3.375 g via INTRAVENOUS
  Filled 2018-12-20 (×14): qty 50

## 2018-12-20 MED ORDER — SODIUM CHLORIDE 0.9 % IV SOLN
INTRAVENOUS | Status: DC
  Administered 2018-12-20 – 2018-12-24 (×10): via INTRAVENOUS

## 2018-12-20 MED ORDER — INSULIN DETEMIR 100 UNIT/ML ~~LOC~~ SOLN
25.0000 [IU] | SUBCUTANEOUS | Status: DC
  Administered 2018-12-20: 25 [IU] via SUBCUTANEOUS
  Filled 2018-12-20 (×2): qty 0.25

## 2018-12-20 MED ORDER — INSULIN ASPART 100 UNIT/ML ~~LOC~~ SOLN: 8.0000 [IU] | Freq: Three times a day (TID) | SUBCUTANEOUS | Status: DC

## 2018-12-20 NOTE — Progress Notes (Signed)
Inpatient Diabetes Program Recommendations  AACE/ADA: New Consensus Statement on Inpatient Glycemic Control (2015)  Target Ranges:  Prepandial:   less than 140 mg/dL      Peak postprandial:   less than 180 mg/dL (1-2 hours)      Critically ill patients:  140 - 180 mg/dL   Lab Results  Component Value Date   GLUCAP 277 (H) 12/20/2018   HGBA1C 14.3 (H) 11/10/2018    Review of Glycemic Control  Results for Timothy Madden, Timothy Madden (MRN [id number]) as of 12/20/2018 08:47  Ref. Range 12/19/2018 08:28 12/19/2018 11:59 12/19/2018 16:38 12/19/2018 21:29 12/20/2018 07:34  Glucose-Capillary Latest Ref Range: 70 - 99 mg/dL 246 (H) 326 (H) 388 (H) 152 (H) 277 (H)    Inpatient Diabetes Program Recommendations:   Fasting CBG's still elevated >200's times past 2 days  -Levemir 25 units  Thank you, Geoffry Paradise, RN, BSN Diabetes Coordinator Inpatient Diabetes Program [phone] (team pager from 8a-5p)

## 2018-12-20 NOTE — Plan of Care (Signed)
  Problem: Activity: Goal: Risk for activity intolerance will decrease Outcome: Progressing   Problem: Nutrition: Goal: Adequate nutrition will be maintained Outcome: Progressing   Problem: Pain Managment: Goal: General experience of comfort will improve Outcome: Progressing   Problem: Safety: Goal: Ability to remain free from injury will improve Outcome: Progressing   

## 2018-12-20 NOTE — TOC Progression Note (Signed)
Transition of Care [facility]) - Progression Note    Patient Details  Name: Timothy Madden MRN: [id number] Date of Birth: 25-Jul-1960  Transition of [facility]) CM/SW [place], [place] Phone Number: 12/20/2018, 4:36 PM  Clinical Narrative:    Spoke with Timothy Gutter, NP at [facility]. She will check her system for any additional pt contacts. CSW combed chart and found a different number listed for pt mother Timothy Madden, [phone]. HIPAA compliant message left.  From various notes it appears pt has not had supportive care in years, often appearing to ED with various care needs. CSW has spoke with leadership, pt will be a difficult placement but does appear to lack community support at this time. TOC team to follow.    Expected Discharge Plan: [place] Barriers to Discharge: Inadequate or no insurance, No SNF bed  Expected Discharge Plan and Services Expected Discharge Plan: [place] In-house Referral: Clinical Social Work Discharge Planning Services: CM Consult, [facility] [place], Medication Assistance Post Acute Care Choice: [place] arrangements for the past 2 months: Single Family Home  Readmission Risk Interventions Readmission Risk Prevention Plan 11/14/2018  Transportation Screening Complete  PCP or Specialist Appt within 3-5 Days Not Complete  Not Complete comments Clinics to which patient goes do not make appointments-it is walk in.  [place] or Home Care Consult Complete  Social Work Consult for [place] Planning/Counseling Complete  Palliative Care Screening Not Applicable  Medication Review (RN Care Manager) Referral to Pharmacy  Some recent data might be hidden

## 2018-12-20 NOTE — Progress Notes (Signed)
Pharmacy Antibiotic Note  Timothy Madden is a 58 y.o. male admitted on 12/08/2018 with pneumonia.  Pharmacy has been consulted for Zosyn dosing.  With CKD  Plan: Zosyn 3.375 grams iv Q 12 hours Pharmacy to sign off and follow peripherally for changes in renal function  Height: 6\' 3"  (190.5 cm) Weight: 110 lb 3.7 oz (50 kg) IBW/kg (Calculated) : 84.5  Temp (24hrs), Avg:98.4 F (36.9 C), Min:98.4 F (36.9 C), Max:98.4 F (36.9 C)  Recent Labs  Lab 12/16/18 0324 12/17/18 0302 12/18/18 0345 12/19/18 0219 12/20/18 0214 12/20/18 1152  WBC 19.0* 20.0* 18.1* 17.6* 16.6*  --   CREATININE 4.89* 4.69* 4.64* 4.68* 4.58* 4.39*  LATICACIDVEN  --   --   --   --   --  2.0*    Estimated Creatinine Clearance: 13.1 mL/min (A) (by C-G formula based on SCr of 4.39 mg/dL (H)).    No Known Allergies    Thank you Anette Guarneri, PharmD  12/20/2018 2:30 PM

## 2018-12-20 NOTE — Progress Notes (Signed)
PROGRESS NOTE    Timothy Madden  EXB:[id number] DOB: Apr 20, 1960 DOA: 12/08/2018 PCP: Marliss Coots, NP   Brief Narrative: 58 year old male with history of type 2 diabetes mellitus CKD stage III baseline creatinine 2.5-2.7, previous osteomyelitis, recent DVT,psychiatric issues?  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 admitted ICU 11/28 obtunded in severe DKA, developed severe AKI creatinine 12 potassium 7, seen by urology nephrology  Subjective:  Alert,awake, no complaints  BP has been soft-trying iv lines for ivf, boluses ordered this am "I am doing alright"  Assessment & Plan:  DKA/Type 2 diabetes mellitus with stage 3 chronic kidney disease uncontrolled hyperglycemia, last hba1c 14.3 poorly controlled: Blood sugar uncontrolled  152-388, increase to 5 u tid premeal insulin, Levemir 25 units nightly, SSI . Monitor and adjust insulin, appreciate diabetes coordinator input  Serratia Sepsis and UTI: Blood cx + for Serratia: Antibiotic completed 12/4.  Leukocytosis unclear etiology. afebrile. Monitor white counts.Chest x-ray 12/8 shows bilateral lower lobe infiltrates consistent with a pneumonia: Patient finished ceftriaxone. Given + cxr, wbc still up and sobt bP will change to zosyn for now, procal is up. Recent Labs  Lab 12/16/18 0324 12/17/18 0302 12/18/18 0345 12/19/18 0219 12/20/18 0214  WBC 19.0* 20.0* 18.1* 17.6* 16.6*   Essential hypertension: Bp soft stopping metoprolol   Hypotension blood pressure soft 90s to 80s but relatively asymptomatic.  Gave bolus 1 L and will order additional 500 ml and start on IV fluid.  Oral intake is  Not well. Lactate chekcd a and at 2. Will give 500 ml nss additional. Monitor BP.  Acute renal failure superimposed on chronic kidney disease  IV: 2/2 DKA, obstructive uropathy, slow to improve.  Urine output decreasing number polyuric.  Nephrology following closely and signing off at this time suspecting this is his baseline.  Nephro plans to  follow-up as outpatient. Recent Labs  Lab 12/16/18 0324 12/17/18 0302 12/18/18 0345 12/19/18 0219 12/20/18 0214  BUN 77* 72* 70* 74* 95*  CREATININE 4.89* 4.69* 4.64* 4.68* 4.58*   ?  Ectopic ureters/Bilateral hydronephrosis: seen by urology did ultrasound kidneys 12/3 repeat 2/4 and 12/5 was discussed with Dr. Claudia Desanctis- no nephrostomies, likely can DC with Foley catheter outpatient follow-up with urology.  Right leg DVT 10/31: continue Eliquis-will need voucher for outpatient.  Anemia of chronic disease, ESA per nephrology.  ferritin high-prohibiting use of Feraheme.  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  Responding slowly but no meds.  Trying to get more history.Appears depressed, requested psychiatric consultation here.  Protein-calorie malnutrition, severe: Continue to augment nutrition, appreciate dietitian inpu  Metabolic acidosis-improved.  Diarrhea secondary to supplementation, now has decreased.  Nutrition: Nutrition Problem: Severe Malnutrition Etiology: chronic illness(uncontrolled DM, suspected gastroparesis) Signs/Symptoms: severe muscle depletion, severe fat depletion Interventions: Boost Breeze  Pressure Ulcer:  Pressure Injury 12/13/18 Sacrum Left Stage II -  Partial thickness loss of dermis presenting as a shallow open ulcer with a red, pink wound bed without slough. (Active)  12/13/18 0800  Location: Sacrum  Location Orientation: Left  Staging: Stage II -  Partial thickness loss of dermis presenting as a shallow open ulcer with a red, pink wound bed without slough.  Wound Description (Comments):   Present on Admission:      Pressure Injury 12/13/18 Sacrum Right Stage II -  Partial thickness loss of dermis presenting as a shallow open ulcer with a red, pink wound bed without slough. (Active)  12/13/18 0800  Location: Sacrum  Location  Orientation: Right  Staging: Stage II -  Partial  thickness loss of dermis presenting as a shallow open ulcer with a red, pink wound bed without slough.  Wound Description (Comments):   Present on Admission:    Body mass index is 13.78 kg/m.    DVT prophylaxis: eliquis Code Status: FULL Family Communication: plan of care discussed with patient , with RN Disposition Plan: Remains inpatient pending clinical improvement.  Need to skilled nursing facility.  Difficult disposition due to lack of insurance homelessness now with significant comorbidities   Consultants: Nephro, Uro  Procedures: Chart Review  Admission 10/31 through 11/5 lower extremity swelling found to have DVT placed indwelling Foley catheter apparently 07/2018-was found to also have ectopic insertion of the ureters Lateral?  Leading to possible dysfunctional ureterovesicular junction  Admission for DKA 6/18 through 6/22-at that time found to have hypoechoic mass  Admission 10/16 through 11/02/2013 osteomyelitis left finger with amputation at that time  Microbiology:  Antimicrobials: Anti-infectives (From admission, onward)   Start     Dose/Rate Route Frequency Ordered Stop   12/19/18 1445  amoxicillin-clavulanate (AUGMENTIN) 500-125 MG per tablet 500 mg     1 tablet Oral Daily 12/19/18 1432 12/24/18 0959   12/19/18 1430  amoxicillin-clavulanate (AUGMENTIN) 875-125 MG per tablet 1 tablet  Status:  Discontinued     1 tablet Oral Every 12 hours 12/19/18 1427 12/19/18 1431   12/09/18 1200  ceFEPIme (MAXIPIME) 1 g in sodium chloride 0.9 % 100 mL IVPB  Status:  Discontinued     1 g 200 mL/hr over 30 Minutes Intravenous Every 24 hours 12/08/18 1452 12/09/18 0752   12/09/18 0900  cefTRIAXone (ROCEPHIN) 2 g in sodium chloride 0.9 % 100 mL IVPB  Status:  Discontinued     2 g 200 mL/hr over 30 Minutes Intravenous Every 24 hours 12/09/18 0752 12/14/18 1302   12/08/18 1600  azithromycin (ZITHROMAX) 500 mg in sodium chloride 0.9 % 250 mL IVPB  Status:  Discontinued     500 mg  250 mL/hr over 60 Minutes Intravenous Every 24 hours 12/08/18 1508 12/09/18 0752   12/08/18 1452  vancomycin variable dose per unstable renal function (pharmacist dosing)  Status:  Discontinued      Does not apply See admin instructions 12/08/18 1452 12/09/18 0752   12/08/18 1100  vancomycin (VANCOCIN) 2,000 mg in sodium chloride 0.9 % 500 mL IVPB     2,000 mg 250 mL/hr over 120 Minutes Intravenous  Once 12/08/18 1050 12/08/18 1433   12/08/18 1045  ceFEPIme (MAXIPIME) 2 g in sodium chloride 0.9 % 100 mL IVPB     2 g 200 mL/hr over 30 Minutes Intravenous  Once 12/08/18 1031 12/08/18 1241   12/08/18 1045  metroNIDAZOLE (FLAGYL) IVPB 500 mg     500 mg 100 mL/hr over 60 Minutes Intravenous  Once 12/08/18 1031 12/08/18 1326   12/08/18 1045  vancomycin (VANCOCIN) IVPB 1000 mg/200 mL premix  Status:  Discontinued     1,000 mg 200 mL/hr over 60 Minutes Intravenous  Once 12/08/18 1031 12/08/18 1035       Objective: Vitals:   12/19/18 0409 12/19/18 1507 12/20/18 0453 12/20/18 0726  BP: 121/71 90/62 (!) 90/59 92/64  Pulse: 87 99 95   Resp: 16 16 19    Temp: 97.6 F (36.4 C) 98.4 F (36.9 C) 98.4 F (36.9 C)   TempSrc: Oral Oral Oral   SpO2: 100% 99% 99%   Weight:      Height:  Intake/Output Summary (Last 24 hours) at 12/20/2018 0847 Last data filed at 12/19/2018 2130 Gross per 24 hour  Intake 240 ml  Output 1850 ml  Net -1610 ml   Filed Weights   12/08/18 1200 12/08/18 2050 12/09/18 0300  Weight: 86.9 kg 60.8 kg 50 kg   Weight change:   Body mass index is 13.78 kg/m.  Intake/Output from previous day: 12/09 0701 - 12/10 0700 In: 596 [P.O.:596] Out: 1850 [Urine:1850] Intake/Output this shift: No intake/output data recorded.  Examination:  General exam: AAO,NAD, Weak appearing. HEENT:Oral mucosa moist, Ear/Nose WNL grossly, dentition normal. Respiratory system: Diminished at the base,no wheezing or crackles,no use of accessory muscle Cardiovascular system: S1 & S2  +, No JVD,. Gastrointestinal system: Abdomen soft, NT,ND, BS+ Nervous System:Alert, awake, moving extremities and grossly nonfocal Extremities: No edema, distal peripheral pulses palpable.  Skin: No rashes,no icterus. MSK: Normal muscle bulk,tone, power  Medications:  Scheduled Meds: . amoxicillin-clavulanate  1 tablet Oral Daily  . apixaban  5 mg Oral BID  . Chlorhexidine Gluconate Cloth  6 each Topical Q0600  . darbepoetin (ARANESP) injection - NON-DIALYSIS  60 mcg Subcutaneous Q Mon-1800  . insulin aspart  0-15 Units Subcutaneous TID WC  . insulin aspart  0-5 Units Subcutaneous QHS  . insulin aspart  2 Units Subcutaneous TID WC  . insulin detemir  20 Units Subcutaneous Q24H  . metoprolol succinate  12.5 mg Oral Daily  . nutrition supplement (JUVEN)  1 packet Oral BID BM  . pantoprazole  40 mg Oral QHS   Continuous Infusions: . sodium chloride 10 mL/hr at 12/15/18 1400  . sodium chloride      Data Reviewed: I have personally reviewed following labs and imaging studies  CBC: Recent Labs  Lab 12/14/18 0148 12/16/18 0324 12/17/18 0302 12/18/18 0345 12/19/18 0219 12/20/18 0214  WBC 19.5* 19.0* 20.0* 18.1* 17.6* 16.6*  NEUTROABS 16.5*  --   --   --  14.2*  --   HGB 8.7* 8.7* 8.5* 7.7* 8.1* 8.1*  HCT 26.8* 27.2* 26.9* 24.6* 25.3* 26.3*  MCV 78.6* 80.0 81.0 81.7 81.1 84.6  PLT 198 309 335 371 404* 027*   Basic Metabolic Panel: Recent Labs  Lab 12/13/18 1050 12/14/18 0148 12/16/18 0324 12/17/18 0302 12/18/18 0345 12/19/18 0219 12/20/18 0214  NA  --  137 137 136 133* 135 132*  K  --  4.1 4.1 4.4 4.4 4.6 4.8  CL  --  91* 97* 99 99 102 99  CO2  --  32 27 23 21* 22 20*  GLUCOSE  --  219* 113* 128* 258* 148* 238*  BUN  --  92* 77* 72* 70* 74* 95*  CREATININE  --  6.00* 4.89* 4.69* 4.64* 4.68* 4.58*  CALCIUM  --  6.7* 8.1* 8.2* 8.3* 8.6* 8.5*  MG 1.4* 1.6*  --   --   --   --   --   PHOS  --  1.8* 3.7 4.7* 4.7* 4.6 4.4   GFR: Estimated Creatinine Clearance: 12.6  mL/min (A) (by C-G formula based on SCr of 4.58 mg/dL (H)). Liver Function Tests: Recent Labs  Lab 12/16/18 0324 12/17/18 0302 12/18/18 0345 12/19/18 0219 12/20/18 0214  ALBUMIN 1.7* 1.6* 1.6* 1.7* 1.8*   No results for input(s): LIPASE, AMYLASE in the last 168 hours. No results for input(s): AMMONIA in the last 168 hours. Coagulation Profile: No results for input(s): INR, PROTIME in the last 168 hours. Cardiac Enzymes: No results for input(s): CKTOTAL, CKMB, CKMBINDEX, TROPONINI  in the last 168 hours. BNP (last 3 results) No results for input(s): PROBNP in the last 8760 hours. HbA1C: No results for input(s): HGBA1C in the last 72 hours. CBG: Recent Labs  Lab 12/19/18 0828 12/19/18 1159 12/19/18 1638 12/19/18 2129 12/20/18 0734  GLUCAP 246* 326* 388* 152* 277*   Lipid Profile: No results for input(s): CHOL, HDL, LDLCALC, TRIG, CHOLHDL, LDLDIRECT in the last 72 hours. Thyroid Function Tests: No results for input(s): TSH, T4TOTAL, FREET4, T3FREE, THYROIDAB in the last 72 hours. Anemia Panel: No results for input(s): VITAMINB12, FOLATE, FERRITIN, TIBC, IRON, RETICCTPCT in the last 72 hours. Sepsis Labs: No results for input(s): PROCALCITON, LATICACIDVEN in the last 168 hours.  No results found for this or any previous visit (from the past 240 hour(s)).    Radiology Studies: DG Chest 2 View  Result Date: 12/18/2018 CLINICAL DATA:  Pneumonia. EXAM: CHEST - 2 VIEW COMPARISON:  November 30, 2018 FINDINGS: Bibasilar infiltrates are identified, primarily in the retrocardiac regions. The heart, hila, mediastinum, lungs, and pleura are otherwise unremarkable. IMPRESSION: Bilateral lower lobe infiltrates consistent with pneumonia given history. Recommend short-term follow-up imaging to ensure resolution. Electronically Signed   By: Dorise Bullion III M.D   On: 12/18/2018 11:01      LOS: 12 days   Time spent: More than 50% of that time was spent in counseling and/or  coordination of care.  Antonieta Pert, MD [facility]  12/20/2018, 8:47 AM

## 2018-12-20 NOTE — TOC Progression Note (Addendum)
Transition of Care [facility]) - Progression Note    Patient Details  Name: Timothy Madden MRN: [id number] Date of Birth: April 08, 1960  Transition of Care [facility]) CM/SW [place], [place] Phone Number: 12/20/2018, 11:48 AM  Clinical Narrative:    CSW received no f/u from [facility] for wellness check.  Message received from [facility] who is pt PCP- will return call for any additional information. Pt was unable to give me another contact number.   Barriers remain pt lack of insurance, lack of additional identified supports. These barriers to placement shared with [facility] supervisor Olga Coaster. We continue to follow.   Expected Discharge Plan: [place] Barriers to Discharge: Inadequate or no insurance, No SNF bed  Expected Discharge Plan and Services Expected Discharge Plan: [place] In-house Referral: Clinical Social Work Discharge Planning Services: CM Consult, [facility], Medication Assistance Post Acute Care Choice: [place] arrangements for the past 2 months: Single Family Home     Readmission Risk Interventions Readmission Risk Prevention Plan 11/14/2018  Transportation Screening Complete  PCP or Specialist Appt within 3-5 Days Not Complete  Not Complete comments Clinics to which patient goes do not make appointments-it is walk in.  [place] or Home Care Consult Complete  Social Work Consult for [place] Planning/Counseling Complete  Palliative Care Screening Not Applicable  Medication Review (RN Care Manager) Referral to Pharmacy  Some recent data might be hidden

## 2018-12-20 NOTE — Progress Notes (Signed)
CRITICAL VALUE ALERT  Critical Value:  CBG 505  Date & Time Notied:  12/10 @12 :10 via page  Provider Notified: Antonieta Pert, MD Orders Received/Actions taken: Per MD to give 15 units per sliding scale in addition to 5 units for meal coverage.  Also to hold glucose labs stat and order BMP panel stat.  Insulin given, will continue to monitor

## 2018-12-20 NOTE — Progress Notes (Signed)
CRITICAL VALUE ALERT  Critical Value:  Lactic acid 2.0  Date & Time Notied:  12/10 @ 1594  Provider Notified: Antonieta Pert via amion  Action taken: Awaiting for further instructions

## 2018-12-20 NOTE — Consult Note (Signed)
Telepsych Consultation   Reason for Consult:  Depression Referring Physician:  Dr Maren Beach Location of Patient:  Location of Provider: [facility]  Patient Identification: Timothy Madden MRN:  [id number] Principal Diagnosis: <principal problem not specified> Diagnosis:  Active Problems:   Type 2 diabetes mellitus with stage 3 chronic kidney disease ([place])   Sepsis ([place])   Essential hypertension   Acute renal failure superimposed on chronic kidney disease ([place])   DKA, type 1, not at goal [facility])   Protein-calorie malnutrition, severe   Acute lower UTI   Bacteremia due to Gram-negative bacteria   Bilateral hydronephrosis   DVT (deep venous thrombosis) (HCC)   Metabolic acidosis   Anemia due to chronic kidney disease   Pressure injury of skin   Total Time spent with patient: 30 minutes  Subjective:   Timothy Madden is a 58 y.o. male patient admitted with poorly controlled diabetes.  Patient seen by nurse practitioner.  Patient alert to self only at this time.  Patient states "I had a nervous breakdown."  Patient appears confused,  Patient reports he lives with his mother [name], gives verbal consent to speak with Latoya at this time.  Attempted #[phone] phone appear to be picked up and then hung up.  Patient reports I live with my mom but I wonder downtown and go to the [facility]. Patient denies suicidal and homicidal ideations at this time.  Patient denies history of self-harm.  Patient denies paranoia and hallucinations. Patient discussed with Dr. Mallie Darting who agrees with disposition, cleared by psychiatry.  Patient does not appear to have ability to care for self independently at this time.    HPI: Patient admitted with poorly controlled diabetes.  Past Psychiatric History: Anxiety  Risk to Self:  no Risk to Others:  no Prior Inpatient Therapy:  unknown Prior Outpatient Therapy:  unknown  Past Medical History:  Past Medical History:  Diagnosis Date  .  Amputation of finger, left   . Complication of anesthesia   . Diabetes mellitus without complication ([place])   . DKA (diabetic ketoacidoses) ([place]) 06/2018  . PONV (postoperative nausea and vomiting)     Past Surgical History:  Procedure Laterality Date  . I&D EXTREMITY Left 10/26/2013   Procedure: IRRIGATION AND DEBRIDEMENT EXTREMITY,PARTIAL AMPUTATION LEFT INDEX FINGER;  Surgeon: Dayna Barker, MD;  Location: [place];  Service: Plastics;  Laterality: Left;  . I&D EXTREMITY Left 10/30/2013   Procedure: IRRIGATION AND DEBRIDEMENT AND REVISION AMPUTATION OF LEFT INDEX FINGER;  Surgeon: Dayna Barker, MD;  Location: [place];  Service: Plastics;  Laterality: Left;   Family History:  Family History  Problem Relation Age of Onset  . Hypertension Mother   . Diabetes Father   . Diabetes Sister   . Diabetes Brother   . Heart disease Brother    Family Psychiatric  History: Unknown Social History:  Social History   Substance and Sexual Activity  Alcohol Use No  . Alcohol/week: 0.0 standard drinks     Social History   Substance and Sexual Activity  Drug Use No    Social History   Socioeconomic History  . Marital status: Legally Separated    Spouse name: Not on file  . Number of children: Not on file  . Years of education: Not on file  . Highest education level: Not on file  Occupational History  . Not on file  Tobacco Use  . Smoking status: Current Every Day Smoker    Packs/day: 1.00  Years: 20.00    Pack years: 20.00    Types: Cigarettes  . Smokeless tobacco: Never Used  Substance and Sexual Activity  . Alcohol use: No    Alcohol/week: 0.0 standard drinks  . Drug use: No  . Sexual activity: Not on file  Other Topics Concern  . Not on file  Social History Narrative  . Not on file   Social Determinants of Health   Financial Resource Strain:   . Difficulty of Paying Living Expenses: Not on file  Food Insecurity:   . Worried About Charity fundraiser in the Last Year:  Not on file  . Ran Out of Food in the Last Year: Not on file  Transportation Needs:   . Lack of Transportation (Medical): Not on file  . Lack of Transportation (Non-Medical): Not on file  Physical Activity:   . Days of Exercise per Week: Not on file  . Minutes of Exercise per Session: Not on file  Stress:   . Feeling of Stress : Not on file  Social Connections:   . Frequency of Communication with Friends and Family: Not on file  . Frequency of Social Gatherings with Friends and Family: Not on file  . Attends Religious Services: Not on file  . Active Member of Clubs or Organizations: Not on file  . Attends Archivist Meetings: Not on file  . Marital Status: Not on file   Additional Social History:    Allergies:  No Known Allergies  Labs:  Results for orders placed or performed during the hospital encounter of 12/08/18 (from the past 48 hour(s))  Glucose, capillary     Status: Abnormal   Collection Time: 12/18/18  5:05 PM  Result Value Ref Range   Glucose-Capillary 302 (H) 70 - 99 mg/dL  Glucose, capillary     Status: Abnormal   Collection Time: 12/18/18  8:51 PM  Result Value Ref Range   Glucose-Capillary 299 (H) 70 - 99 mg/dL  Renal function panel AM     Status: Abnormal   Collection Time: 12/19/18  2:19 AM  Result Value Ref Range   Sodium 135 135 - 145 mmol/L   Potassium 4.6 3.5 - 5.1 mmol/L   Chloride 102 98 - 111 mmol/L   CO2 22 22 - 32 mmol/L   Glucose, Bld 148 (H) 70 - 99 mg/dL   BUN 74 (H) 6 - 20 mg/dL   Creatinine, Ser 4.68 (H) 0.61 - 1.24 mg/dL   Calcium 8.6 (L) 8.9 - 10.3 mg/dL   Phosphorus 4.6 2.5 - 4.6 mg/dL   Albumin 1.7 (L) 3.5 - 5.0 g/dL   GFR calc non Af Amer 13 (L) >60 mL/min   GFR calc Af Amer 15 (L) >60 mL/min   Anion gap 11 5 - 15    Comment: Performed at [facility], [address], [place], [place] [address]  AM CBC     Status: Abnormal   Collection Time: 12/19/18  2:19 AM  Result Value Ref Range   WBC 17.6 (H) 4.0 - 10.5 K/uL    RBC 3.12 (L) 4.22 - 5.81 MIL/uL   Hemoglobin 8.1 (L) 13.0 - 17.0 g/dL   HCT 25.3 (L) 39.0 - 52.0 %   MCV 81.1 80.0 - 100.0 fL   MCH 26.0 26.0 - 34.0 pg   MCHC 32.0 30.0 - 36.0 g/dL   RDW 15.8 (H) 11.5 - 15.5 %   Platelets 404 (H) 150 - 400 K/uL   nRBC  0.0 0.0 - 0.2 %   Neutrophils Relative % 79 %   Neutro Abs 14.2 (H) 1.7 - 7.7 K/uL   Lymphocytes Relative 12 %   Lymphs Abs 2.1 0.7 - 4.0 K/uL   Monocytes Relative 5 %   Monocytes Absolute 0.9 0.1 - 1.0 K/uL   Eosinophils Relative 1 %   Eosinophils Absolute 0.1 0.0 - 0.5 K/uL   Basophils Relative 1 %   Basophils Absolute 0.1 0.0 - 0.1 K/uL   Immature Granulocytes 2 %   Abs Immature Granulocytes 0.31 (H) 0.00 - 0.07 K/uL    Comment: Performed at [facility] [address] [place], [place] [address]  Glucose, capillary     Status: Abnormal   Collection Time: 12/19/18  8:28 AM  Result Value Ref Range   Glucose-Capillary 246 (H) 70 - 99 mg/dL  Glucose, capillary     Status: Abnormal   Collection Time: 12/19/18 11:59 AM  Result Value Ref Range   Glucose-Capillary 326 (H) 70 - 99 mg/dL  Glucose, capillary     Status: Abnormal   Collection Time: 12/19/18  4:38 PM  Result Value Ref Range   Glucose-Capillary 388 (H) 70 - 99 mg/dL   Comment 1 Notify RN   Glucose, capillary     Status: Abnormal   Collection Time: 12/19/18  9:29 PM  Result Value Ref Range   Glucose-Capillary 152 (H) 70 - 99 mg/dL  Renal function panel AM     Status: Abnormal   Collection Time: 12/20/18  2:14 AM  Result Value Ref Range   Sodium 132 (L) 135 - 145 mmol/L   Potassium 4.8 3.5 - 5.1 mmol/L   Chloride 99 98 - 111 mmol/L   CO2 20 (L) 22 - 32 mmol/L   Glucose, Bld 238 (H) 70 - 99 mg/dL   BUN 95 (H) 6 - 20 mg/dL   Creatinine, Ser 4.58 (H) 0.61 - 1.24 mg/dL   Calcium 8.5 (L) 8.9 - 10.3 mg/dL   Phosphorus 4.4 2.5 - 4.6 mg/dL   Albumin 1.8 (L) 3.5 - 5.0 g/dL   GFR calc non Af Amer 13 (L) >60 mL/min   GFR calc Af Amer 15 (L) >60 mL/min   Anion  gap 13 5 - 15    Comment: Performed at [facility], [place] [address], [place], [place] [address]  CBC     Status: Abnormal   Collection Time: 12/20/18  2:14 AM  Result Value Ref Range   WBC 16.6 (H) 4.0 - 10.5 K/uL   RBC 3.11 (L) 4.22 - 5.81 MIL/uL   Hemoglobin 8.1 (L) 13.0 - 17.0 g/dL   HCT 26.3 (L) 39.0 - 52.0 %   MCV 84.6 80.0 - 100.0 fL   MCH 26.0 26.0 - 34.0 pg   MCHC 30.8 30.0 - 36.0 g/dL   RDW 15.9 (H) 11.5 - 15.5 %   Platelets 441 (H) 150 - 400 K/uL   nRBC 0.0 0.0 - 0.2 %    Comment: Performed at [facility] [address], [place], [place] [address]  Glucose, capillary     Status: Abnormal   Collection Time: 12/20/18  7:34 AM  Result Value Ref Range   Glucose-Capillary 277 (H) 70 - 99 mg/dL  Procalcitonin - Baseline     Status: None   Collection Time: 12/20/18 11:52 AM  Result Value Ref Range   Procalcitonin 0.78 ng/mL    Comment:        Interpretation:  PCT > 0.5 ng/mL and <= 2 ng/mL: Systemic infection (sepsis) is possible, but other conditions are known to elevate PCT as well. (NOTE)       Sepsis PCT Algorithm           Lower Respiratory Tract                                      Infection PCT Algorithm    ----------------------------     ----------------------------         PCT < 0.25 ng/mL                PCT < 0.10 ng/mL         Strongly encourage             Strongly discourage   discontinuation of antibiotics    initiation of antibiotics    ----------------------------     -----------------------------       PCT 0.25 - 0.50 ng/mL            PCT 0.10 - 0.25 ng/mL               OR       >80% decrease in PCT            Discourage initiation of                                            antibiotics      Encourage discontinuation           of antibiotics    ----------------------------     -----------------------------         PCT >= 0.50 ng/mL              PCT 0.26 - 0.50 ng/mL                AND       <80% decrease in PCT             Encourage  initiation of                                             antibiotics       Encourage continuation           of antibiotics    ----------------------------     -----------------------------        PCT >= 0.50 ng/mL                  PCT > 0.50 ng/mL               AND         increase in PCT                  Strongly encourage                                      initiation of antibiotics    Strongly encourage escalation           of antibiotics                                     -----------------------------  PCT <= 0.25 ng/mL                                                 OR                                        > 80% decrease in PCT                                     Discontinue / Do not initiate                                             antibiotics Performed at [facility], [place] [address] [place], [place] [address]   Lactic acid, plasma     Status: Abnormal   Collection Time: 12/20/18 11:52 AM  Result Value Ref Range   Lactic Acid, Venous 2.0 (HH) 0.5 - 1.9 mmol/L    Comment: CRITICAL RESULT CALLED TO, READ BACK BY AND VERIFIED WITH: L.THOMPSON,RN 1258 12/20/2018 CLARK,S Performed at [facility], [place] [address], [place], [place] [address]   Basic metabolic panel     Status: Abnormal   Collection Time: 12/20/18 11:52 AM  Result Value Ref Range   Sodium 133 (L) 135 - 145 mmol/L   Potassium 5.2 (H) 3.5 - 5.1 mmol/L   Chloride 103 98 - 111 mmol/L   CO2 19 (L) 22 - 32 mmol/L   Glucose, Bld 525 (HH) 70 - 99 mg/dL    Comment: CRITICAL RESULT CALLED TO, READ BACK BY AND VERIFIED WITH: J.WHITAKER,RN 1338 12/20/2018 CLARK,S    BUN 93 (H) 6 - 20 mg/dL   Creatinine, Ser 4.39 (H) 0.61 - 1.24 mg/dL   Calcium 8.1 (L) 8.9 - 10.3 mg/dL   GFR calc non Af Amer 14 (L) >60 mL/min   GFR calc Af Amer 16 (L) >60 mL/min   Anion gap 11 5 - 15    Comment: Performed at [facility], [place] [address] [place], [place] [address]   Glucose, capillary     Status: Abnormal   Collection Time: 12/20/18 12:08 PM  Result Value Ref Range   Glucose-Capillary 505 (HH) 70 - 99 mg/dL   Comment 1 Notify RN   Glucose, capillary     Status: Abnormal   Collection Time: 12/20/18  1:41 PM  Result Value Ref Range   Glucose-Capillary 487 (H) 70 - 99 mg/dL    Medications:  Current Facility-Administered Medications  Medication Dose Route Frequency Provider Last Rate Last Admin  . 0.9 %  sodium chloride infusion   Intravenous PRN Nita Sells, MD 10 mL/hr at 12/15/18 1400 Rate Verify at 12/15/18 1400  . 0.9 %  sodium chloride infusion   Intravenous Continuous Kc, Ramesh, MD 125 mL/hr at 12/20/18 1403 Rate Change at 12/20/18 1403  . acetaminophen (TYLENOL) tablet 650 mg  650 mg Oral Q6H PRN Nita Sells, MD   650 mg at 12/17/18 2209  . apixaban (ELIQUIS) tablet 5 mg  5 mg Oral BID Nita Sells, MD   5  mg at 12/20/18 1129  . Chlorhexidine Gluconate Cloth 2 % PADS 6 each  6 each Topical Q0600 Nita Sells, MD   6 each at 12/20/18 0516  . Darbepoetin Alfa (ARANESP) injection 60 mcg  60 mcg Subcutaneous Q Mon-1800 Elmarie Shiley, MD   60 mcg at 12/17/18 1722  . dextrose 50 % solution 0-50 mL  0-50 mL Intravenous PRN Nita Sells, MD      . insulin aspart (novoLOG) injection 0-15 Units  0-15 Units Subcutaneous TID WC Nita Sells, MD   15 Units at 12/20/18 1225  . insulin aspart (novoLOG) injection 0-5 Units  0-5 Units Subcutaneous QHS Nita Sells, MD   3 Units at 12/18/18 2054  . insulin aspart (novoLOG) injection 8 Units  8 Units Subcutaneous TID WC Kc, Ramesh, MD      . insulin detemir (LEVEMIR) injection 25 Units  25 Units Subcutaneous Q24H Kc, Ramesh, MD   25 Units at 12/20/18 1300  . insulin glargine (LANTUS) injection 10 Units  10 Units Subcutaneous Once Kc, Ramesh, MD      . metoprolol succinate (TOPROL-XL) 24 hr tablet 12.5 mg  12.5 mg Oral Daily Kc, Ramesh, MD   12.5 mg at 12/19/18  0847  . nitroGLYCERIN (NITROSTAT) SL tablet 0.4 mg  0.4 mg Sublingual Q5 min PRN Nita Sells, MD   0.4 mg at 12/10/18 0434  . nutrition supplement (JUVEN) (JUVEN) powder packet 1 packet  1 packet Oral BID BM Nita Sells, MD   1 packet at 12/20/18 1412  . pantoprazole (PROTONIX) EC tablet 40 mg  40 mg Oral QHS Nita Sells, MD   40 mg at 12/20/18 0047  . piperacillin-tazobactam (ZOSYN) IVPB 3.375 g  3.375 g Intravenous Q12H Antonieta Pert, MD        Musculoskeletal: Strength & Muscle Tone: unable to assess Gait & Station: unable to assess Patient leans: unable to assess  Psychiatric Specialty Exam: Physical Exam  Nursing note and vitals reviewed. Constitutional: He appears well-developed.  HENT:  Head: Normocephalic.  Cardiovascular: Normal rate.  Respiratory: Effort normal.  Neurological: He is alert.    Review of Systems  Constitutional: Negative.   HENT: Negative.   Eyes: Negative.   Respiratory: Negative.   Cardiovascular: Negative.   Gastrointestinal: Negative.   Genitourinary: Negative.   Musculoskeletal: Negative.   Skin: Negative.   Neurological: Negative.   Psychiatric/Behavioral: Positive for confusion.    Blood pressure (!) 92/59, pulse 100, temperature 98.4 F (36.9 C), temperature source Oral, resp. rate 18, height 6\' 3"  (1.905 m), weight 50 kg, SpO2 99 %.Body mass index is 13.78 kg/m.  General Appearance: Casual  Eye Contact:  Minimal  Speech:  Normal Rate  Volume:  Normal  Mood:  Euthymic  Affect:  Congruent  Thought Process:  Disorganized and Descriptions of Associations: Intact  Orientation:  Other:  oriented to self only  Thought Content:  Logical  Suicidal Thoughts:  No  Homicidal Thoughts:  No  Memory:  Immediate;   Poor Remote;   Poor  Judgement:  Impaired  Insight:  Lacking  Psychomotor Activity:  Normal  Concentration:  Concentration: Fair and Attention Span: Fair  Recall:  Poor  Fund of Knowledge:  Fair  Language:   Fair  Akathisia:  No  Handed:  Right  AIMS (if indicated):     Assets:  Communication Skills  ADL's:  Impaired  Cognition:  Impaired,  Moderate  Sleep:        Treatment Plan Summary: Patient cleared by psychiatry.  Patient does not appear to have ability to care for self independently at this time.  Consider Remeron 15mg  QHS to promote sleep and stimulate appetite.   Disposition: No evidence of imminent risk to self or others at present.   Patient does not meet criteria for psychiatric inpatient admission.  This service was provided via telemedicine using a 2-way, interactive audio and video technology.  Names of all persons participating in this telemedicine service and their role in this encounter. Name: Dorothy Spark Role: Patient  Name: Letitia Libra Role: [place], [place] 12/20/2018 2:33 PM

## 2018-12-21 LAB — RENAL FUNCTION PANEL
Albumin: 1.8 g/dL — ABNORMAL LOW (ref 3.5–5.0)
Anion gap: 12 (ref 5–15)
BUN: 96 mg/dL — ABNORMAL HIGH (ref 6–20)
CO2: 19 mmol/L — ABNORMAL LOW (ref 22–32)
Calcium: 8.7 mg/dL — ABNORMAL LOW (ref 8.9–10.3)
Chloride: 110 mmol/L (ref 98–111)
Creatinine, Ser: 4.15 mg/dL — ABNORMAL HIGH (ref 0.61–1.24)
GFR calc Af Amer: 17 mL/min — ABNORMAL LOW (ref >60)
GFR calc non Af Amer: 15 mL/min — ABNORMAL LOW (ref >60)
Glucose, Bld: 49 mg/dL — ABNORMAL LOW (ref 70–99)
Phosphorus: 3.7 mg/dL (ref 2.5–4.6)
Potassium: 4.7 mmol/L (ref 3.5–5.1)
Sodium: 141 mmol/L (ref 135–145)

## 2018-12-21 LAB — GLUCOSE, CAPILLARY
Glucose-Capillary: 40 mg/dL — CL (ref 70–99)
Glucose-Capillary: 79 mg/dL (ref 70–99)
Glucose-Capillary: 215 mg/dL — ABNORMAL HIGH (ref 70–99)
Glucose-Capillary: 158 mg/dL — ABNORMAL HIGH (ref 70–99)
Glucose-Capillary: 364 mg/dL — ABNORMAL HIGH (ref 70–99)
Glucose-Capillary: 214 mg/dL — ABNORMAL HIGH (ref 70–99)
Glucose-Capillary: 243 mg/dL — ABNORMAL HIGH (ref 70–99)
Glucose-Capillary: 230 mg/dL — ABNORMAL HIGH (ref 70–99)

## 2018-12-21 LAB — CBC
HCT: 25.5 % — ABNORMAL LOW (ref 39.0–52.0)
Hemoglobin: 7.9 g/dL — ABNORMAL LOW (ref 13.0–17.0)
MCH: 26.3 pg (ref 26.0–34.0)
MCHC: 31 g/dL (ref 30.0–36.0)
MCV: 85 fL (ref 80.0–100.0)
Platelets: 510 10*3/uL — ABNORMAL HIGH (ref 150–400)
RBC: 3 MIL/uL — ABNORMAL LOW (ref 4.22–5.81)
RDW: 16.1 % — ABNORMAL HIGH (ref 11.5–15.5)
WBC: 16 10*3/uL — ABNORMAL HIGH (ref 4.0–10.5)
nRBC: 0 % (ref 0.0–0.2)

## 2018-12-21 LAB — PROCALCITONIN: Procalcitonin: 0.72 ng/mL

## 2018-12-21 MED ORDER — INSULIN DETEMIR 100 UNIT/ML ~~LOC~~ SOLN
16.0000 [IU] | SUBCUTANEOUS | Status: DC
  Administered 2018-12-21 – 2018-12-25 (×5): 16 [IU] via SUBCUTANEOUS
  Filled 2018-12-21 (×5): qty 0.16

## 2018-12-21 MED ORDER — INSULIN ASPART 100 UNIT/ML ~~LOC~~ SOLN
0.0000 [IU] | Freq: Three times a day (TID) | SUBCUTANEOUS | Status: DC
  Administered 2018-12-21: 15 [IU] via SUBCUTANEOUS
  Administered 2018-12-21: 3 [IU] via SUBCUTANEOUS
  Administered 2018-12-21: 5 [IU] via SUBCUTANEOUS
  Administered 2018-12-22: 2 [IU] via SUBCUTANEOUS
  Administered 2018-12-22 (×2): 3 [IU] via SUBCUTANEOUS
  Administered 2018-12-23: 2 [IU] via SUBCUTANEOUS
  Administered 2018-12-23: 3 [IU] via SUBCUTANEOUS
  Administered 2018-12-23: 5 [IU] via SUBCUTANEOUS
  Administered 2018-12-24 (×2): 3 [IU] via SUBCUTANEOUS
  Administered 2018-12-25: 09:00:00 2 [IU] via SUBCUTANEOUS
  Administered 2018-12-25: 18:00:00 5 [IU] via SUBCUTANEOUS
  Administered 2018-12-26 – 2018-12-27 (×3): 3 [IU] via SUBCUTANEOUS
  Administered 2018-12-27: 12:00:00 5 [IU] via SUBCUTANEOUS
  Administered 2018-12-27: 17:00:00 2 [IU] via SUBCUTANEOUS
  Administered 2018-12-28: 11 [IU] via SUBCUTANEOUS
  Administered 2018-12-28: 17:00:00 3 [IU] via SUBCUTANEOUS
  Administered 2018-12-28: 2 [IU] via SUBCUTANEOUS
  Administered 2018-12-29: 8 [IU] via SUBCUTANEOUS
  Administered 2018-12-30: 5 [IU] via SUBCUTANEOUS
  Administered 2018-12-30: 18:00:00 3 [IU] via SUBCUTANEOUS
  Administered 2018-12-30: 15 [IU] via SUBCUTANEOUS
  Administered 2018-12-31: 3 [IU] via SUBCUTANEOUS
  Administered 2018-12-31: 12:00:00 15 [IU] via SUBCUTANEOUS
  Administered 2019-01-01: 13:00:00 8 [IU] via SUBCUTANEOUS
  Administered 2019-01-01 – 2019-01-02 (×3): 3 [IU] via SUBCUTANEOUS
  Administered 2019-01-02: 12:00:00 5 [IU] via SUBCUTANEOUS
  Administered 2019-01-02: 08:00:00 15 [IU] via SUBCUTANEOUS
  Administered 2019-01-03: 8 [IU] via SUBCUTANEOUS
  Administered 2019-01-03: 18:00:00 3 [IU] via SUBCUTANEOUS
  Administered 2019-01-03: 8 [IU] via SUBCUTANEOUS
  Administered 2019-01-04: 13:00:00 2 [IU] via SUBCUTANEOUS
  Administered 2019-01-04: 8 [IU] via SUBCUTANEOUS
  Administered 2019-01-04: 17:00:00 5 [IU] via SUBCUTANEOUS
  Administered 2019-01-05: 09:00:00 8 [IU] via SUBCUTANEOUS
  Administered 2019-01-05: 2 [IU] via SUBCUTANEOUS
  Administered 2019-01-06: 5 [IU] via SUBCUTANEOUS
  Administered 2019-01-06 – 2019-01-08 (×3): 3 [IU] via SUBCUTANEOUS
  Administered 2019-01-08: 18:00:00 5 [IU] via SUBCUTANEOUS
  Administered 2019-01-09: 13:00:00 2 [IU] via SUBCUTANEOUS
  Administered 2019-01-09: 08:00:00 3 [IU] via SUBCUTANEOUS
  Administered 2019-01-10: 19:00:00 8 [IU] via SUBCUTANEOUS
  Administered 2019-01-10: 2 [IU] via SUBCUTANEOUS
  Administered 2019-01-11: 17:00:00 3 [IU] via SUBCUTANEOUS
  Administered 2019-01-11: 09:00:00 8 [IU] via SUBCUTANEOUS
  Administered 2019-01-12: 08:00:00 15 [IU] via SUBCUTANEOUS
  Administered 2019-01-12: 17:00:00 8 [IU] via SUBCUTANEOUS
  Administered 2019-01-13 – 2019-01-14 (×3): 2 [IU] via SUBCUTANEOUS
  Administered 2019-01-14 – 2019-01-16 (×4): 5 [IU] via SUBCUTANEOUS
  Administered 2019-01-16: 08:00:00 3 [IU] via SUBCUTANEOUS
  Administered 2019-01-17 (×2): 5 [IU] via SUBCUTANEOUS
  Administered 2019-01-18: 8 [IU] via SUBCUTANEOUS
  Administered 2019-01-19: 09:00:00 11 [IU] via SUBCUTANEOUS

## 2018-12-21 MED ORDER — MIRTAZAPINE 15 MG PO TABS
7.5000 mg | ORAL_TABLET | Freq: Every day | ORAL | Status: DC
  Administered 2018-12-21 – 2019-01-18 (×29): 7.5 mg via ORAL
  Filled 2018-12-21 (×29): qty 1

## 2018-12-21 MED ORDER — INSULIN ASPART 100 UNIT/ML ~~LOC~~ SOLN
8.0000 [IU] | Freq: Three times a day (TID) | SUBCUTANEOUS | Status: DC
  Administered 2018-12-21 – 2018-12-28 (×18): 8 [IU] via SUBCUTANEOUS

## 2018-12-21 MED ORDER — INSULIN ASPART 100 UNIT/ML ~~LOC~~ SOLN
0.0000 [IU] | Freq: Every day | SUBCUTANEOUS | Status: DC
  Administered 2018-12-21 – 2018-12-27 (×3): 2 [IU] via SUBCUTANEOUS
  Administered 2018-12-31: 22:00:00 3 [IU] via SUBCUTANEOUS
  Administered 2019-01-02 – 2019-01-14 (×2): 2 [IU] via SUBCUTANEOUS

## 2018-12-21 NOTE — Progress Notes (Signed)
Physical Therapy Treatment Patient Details Name: Timothy Madden MRN: [id number] DOB: [birth year] Today's Date: 12/21/2018    History of Present Illness This is a 58 year old man with a history of numerous psychiatric issues, diabetes poorly controlled supposed to be on home insulin presenting with obtundation from home found to be in severe DKA/HOCM.  PMH DKA, DM, L second finger amputation.    PT Comments    Pt in bed upon PT arrival, agreeable to PT session with focus on ambulation. The pt was able to demonstrate improvements in bed mobility and transfers as he required less assistance to complete the movements, despite continued benefit from extra time and verbal cues for sequencing of the task. The pt then ambulated with a RW ~150 ft in the hall with min guard for safety. The patient ambulates with a gait speed of 0.29m/s, a gait speed less than 0.10m/s indicates increased risk of falls and dependence in ADLs, and therefore the patient will continue to benefit from skilled PT to address limitations in functional endurance and mobility.    Follow Up Recommendations  SNF     Equipment Recommendations  Rolling walker with 5" wheels    Recommendations for Other Services       Precautions / Restrictions Precautions Precautions: Fall Restrictions Weight Bearing Restrictions: No    Mobility  Bed Mobility Overal bed mobility: Needs Assistance Bed Mobility: Supine to Sit     Supine to sit: Supervision     General bed mobility comments: Pt able to move to EOB with sig extra time and VCs for each part of sequence, especially for BLE movement.  Transfers Overall transfer level: Needs assistance Equipment used: Rolling walker (2 wheeled) Transfers: Sit to/from Stand Sit to Stand: Min assist Stand pivot transfers: +2 physical assistance;Min assist       General transfer comment: Cues for hand placement, including using grab bar in sitting to commode in  bathroom  Ambulation/Gait Ambulation/Gait assistance: Min assist;Min guard;+2 safety/equipment Gait Distance (Feet): 150 Feet Assistive device: Rolling walker (2 wheeled) Gait Pattern/deviations: Decreased step length - right;Decreased step length - left;Decreased stride length;Shuffle Gait velocity: 0.31 m/s Gait velocity interpretation: <1.31 ft/sec, indicative of household ambulator General Gait Details: Slow steps and good use of RW to steady; minguard overall for safety. Pt with standing rest after about 130 ft does not report dizzy but closes and opens eyes quickly as if trying hard to focus vision   Stairs             Wheelchair Mobility    Modified Rankin (Stroke Patients Only)       Balance Overall balance assessment: Needs assistance Sitting-balance support: Feet supported;No upper extremity supported Sitting balance-Leahy Scale: Good Sitting balance - Comments: once positioned upright with feet on floor balanced without support, hands in his lap, supervision   Standing balance support: Bilateral upper extremity supported Standing balance-Leahy Scale: Poor                              Cognition Arousal/Alertness: Awake/alert Behavior During Therapy: Flat affect(one word answers, most answers are "yes" or "yeah") Overall Cognitive Status: No family/caregiver present to determine baseline cognitive functioning Area of Impairment: Problem solving;Memory                   Current Attention Level: Focused Memory: Decreased short-term memory(pt unable to remember room number, reports headed to room 6N17 despite being told he was in  room 6N13 ~2 min prior) Following Commands: Follows one step commands inconsistently;Follows one step commands with increased time Safety/Judgement: Decreased awareness of deficits   Problem Solving: Slow processing;Decreased initiation;Difficulty sequencing;Requires verbal cues;Requires tactile cues General  Comments: Pt agreeable to session, but remained flat affect with minimally worded responses even with open-ended questions      Exercises      General Comments        Pertinent Vitals/Pain Pain Assessment: No/denies pain Pain Score: 0-No pain Pain Intervention(s): Limited activity within patient's tolerance;Monitored during session;Repositioned(pt reports no pain, appeared to become uncomfortable during ambulation, continued to deny pain, only fatigue)    Home Living                      Prior Function            PT Goals (current goals can now be found in the care plan section) Acute Rehab PT Goals Patient Stated Goal: return home PT Goal Formulation: With patient Time For Goal Achievement: 12/24/18 Potential to Achieve Goals: Fair Progress towards PT goals: Progressing toward goals    Frequency    Min 2X/week      PT Plan Current plan remains appropriate    Co-evaluation              AM-PAC PT "6 Clicks" Mobility   Outcome Measure  Help needed turning from your back to your side while in a flat bed without using bedrails?: A Little Help needed moving from lying on your back to sitting on the side of a flat bed without using bedrails?: A Little Help needed moving to and from a bed to a chair (including a wheelchair)?: A Little Help needed standing up from a chair using your arms (e.g., wheelchair or bedside chair)?: A Little Help needed to walk in hospital room?: A Little Help needed climbing 3-5 steps with a railing? : A Lot 6 Click Score: 17    End of Session Equipment Utilized During Treatment: Gait belt Activity Tolerance: Patient tolerated treatment well Patient left: in chair;with call bell/phone within reach;with chair alarm set Nurse Communication: Mobility status;Other (comment)(need to empty foley cath) PT Visit Diagnosis: Other abnormalities of gait and mobility (R26.89);Muscle weakness (generalized) (M62.81);Other symptoms and signs  involving the nervous system (D56.861)     Time: 6837-2902 PT Time Calculation (min) (ACUTE ONLY): 29 min  Charges:  $Gait Training: 8-22 mins $Therapeutic Activity: 8-22 mins                     Karma Ganja, PT, DPT   Acute Rehabilitation Department [phone]   Otho Bellows 12/21/2018, 11:39 AM

## 2018-12-21 NOTE — Progress Notes (Signed)
  Speech Language Pathology Treatment: Dysphagia  Patient Details Name: Timothy Madden MRN: [id number] DOB: 1960/11/26 Today's Date: 12/21/2018 Time: 0920-0939 SLP Time Calculation (min) (ACUTE ONLY): 19 min  Assessment / Plan / Recommendation Clinical Impression  Pt sitting up in chair feeding himself breakfast. Pt tolerating Dys 3, thin liquid diet with no s/s of aspiration. Pt reports he is able to chew and swallow current diet. RN reports no issues with medication. Recommend continuation of current diet. Speech Therapy to sign off at this time, all goals are met and skilled services are no longer needed.    HPI HPI: 58 yo male admitted with AMS r/t severe DKA/HHS. PMH includes poorly controlled DM, severe PCM, gastroparesis, numerous psychiatric issues.       SLP Plan  All goals met       Recommendations  Diet recommendations: Dysphagia 3 (mechanical soft);Thin liquid Liquids provided via: Cup;Straw Medication Administration: Whole meds with puree Supervision: Patient able to self feed Compensations: Minimize environmental distractions Postural Changes and/or Swallow Maneuvers: Seated upright 90 degrees;Upright 30-60 min after meal                Oral Care Recommendations: Oral care BID Follow up Recommendations: 24 hour supervision/assistance SLP Visit Diagnosis: Dysphagia, oral phase (R13.11) Plan: All goals met       GO                Wynelle Bourgeois., MA, CCC-SLP 12/21/2018, 11:57 AM

## 2018-12-21 NOTE — Progress Notes (Signed)
PROGRESS NOTE    Timothy Madden  ZDG:[id number] DOB: June 15, 1960 DOA: 12/08/2018 PCP: Marliss Coots, NP   Brief Narrative: 58 year old male with history of type 2 diabetes mellitus CKD stage III baseline creatinine 2.5-2.7, previous osteomyelitis, recent DVT,psychiatric issues?  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 Blood pressure stable in high 90s, afebrile doing well on room air. Sitting comfortably on the bedside chair and has no complaint. Reports he ate half of his meal.  Assessment & Plan:  DKA/Type 2 diabetes mellitus with stage 3 chronic kidney disease uncontrolled hyperglycemia, last hba1c 14.3 poorly controlled: Blood sugar has been labile running up to 500 yesterday patient did receive extra Lantus. Will keep him on 8 u tid novolog, ss and lantus 16 utine bedtime. Monitor and adjust insulin, appreciate diabetes coordinator input  Serratia Sepsis and UTI: Blood cx + for Serratia: Antibiotic completed 12/4.  Leukocytosis unclear etiology. afebrile. Monitor white counts.Chest x-ray 12/8 shows bilateral lower lobe infiltrates consistent with a pneumonia: Patient finished ceftriaxone. Given + cxr, wbc still up and soft BP  changed to zosyn 12/10- cont for 5 days- procal is at 0.72 but significantly below from previous value of 47-54.  Remains afebrile. Recent Labs  Lab 12/17/18 0302 12/18/18 0345 12/19/18 0219 12/20/18 0214 12/21/18 0251  WBC 20.0* 18.1* 17.6* 16.6* 16.0*   Essential hypertension: Bp soft stopped metoprolol   Hypotension blood pressure soft 90s to 80s but relatively asymptomatic.  Gave 2 L IV fluid 12/10 keep on IV normal saline was for fluid overload.  Mildly elevated lactate likely from  combination of hyperglycemia and soft BP.  Acute renal failure superimposed on chronic kidney disease  IV: 2/2 DKA, obstructive uropathy, slow to improve.  Urine output decreasing number polyuric.  Nephrology following closely and signed off at this time suspecting this is his baseline.  Nephro plans to follow-up as outpatient.  Creatinine seems to be better at 4.1 as below Recent Labs  Lab 12/18/18 0345 12/19/18 0219 12/20/18 0214 12/20/18 1152 12/21/18 0251  BUN 70* 74* 95* 93* 96*  CREATININE 4.64* 4.68* 4.58* 4.39* 4.15*   ?  Ectopic ureters/Bilateral hydronephrosis: seen by urology did ultrasound kidneys 12/3 repeat 2/4 and 12/5 was discussed with Dr. Claudia Desanctis- no nephrostomies, likely can DC with Foley catheter outpatient follow-up with urology.  Right leg DVT 10/31: continue Eliquis-will need voucher for outpatient.  Anemia of chronic disease, ESA per nephrology.  ferritin high-prohibiting use of Feraheme.  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  Responding slowly but no meds.  Trying to get more history.Appears depressed, requested psychiatric consultation here.  We can try Remeron 7.5 mg bedtime  As per psychiatry discussion seen by psychiatry no inpatient psych needs.  Protein-calorie malnutrition, severe: Continue to augment nutrition, appreciate dietitian input. Add remeron bedtimne  Metabolic acidosis-improved.  Diarrhea secondary to supplementation, now has decreased.  Nutrition: Nutrition Problem: Severe Malnutrition Etiology: chronic illness(uncontrolled DM, suspected gastroparesis) Signs/Symptoms: severe muscle depletion, severe fat depletion Interventions: Boost Breeze  Pressure Ulcer:  Pressure  Injury 12/13/18 Sacrum Left Stage II -  Partial thickness loss of dermis presenting as a shallow open ulcer with a red, pink wound bed without slough. (Active)  12/13/18 0800  Location: Sacrum  Location Orientation: Left  Staging: Stage II -  Partial thickness loss of dermis presenting as a shallow open ulcer with a red, pink wound bed without slough.  Wound Description (Comments):   Present on Admission:      Pressure Injury 12/13/18 Sacrum Right Stage II -  Partial thickness loss of dermis presenting as a shallow open ulcer with a red, pink wound bed without slough. (Active)  12/13/18 0800  Location: Sacrum  Location Orientation: Right  Staging: Stage II -  Partial thickness loss of dermis presenting as a shallow open ulcer with a red, pink wound bed without slough.  Wound Description (Comments):   Present on Admission:    Body mass index is 13.78 kg/m.    DVT prophylaxis: eliquis Code Status: FULL Family Communication: plan of care discussed with patient , with RN. Tried calling his mother at [phone]- not not in service. called [phone]- phone got picked up but then hung up.  Disposition Plan: Remains inpatient pending clinical improvement.  Need skilled nursing facility.  Difficult disposition due to lack of insurance homelessness now with significant comorbidities  Consultants: Nephro, Uro  Procedures: Chart Review  Admission 10/31 through 11/5 lower extremity swelling found to have DVT placed indwelling Foley catheter apparently 07/2018-was found to also have ectopic insertion of the ureters Lateral?  Leading to possible dysfunctional ureterovesicular junction  Admission for DKA 6/18 through 6/22-at that time found to have hypoechoic mass  Admission 10/16 through 11/02/2013 osteomyelitis left finger with amputation at that time  Microbiology:  Antimicrobials: Anti-infectives (From admission, onward)   Start     Dose/Rate Route Frequency Ordered Stop   12/20/18  1600  piperacillin-tazobactam (ZOSYN) IVPB 3.375 g     3.375 g 12.5 mL/hr over 240 Minutes Intravenous Every 12 hours 12/20/18 1429     12/19/18 1445  amoxicillin-clavulanate (AUGMENTIN) 500-125 MG per tablet 500 mg  Status:  Discontinued     1 tablet Oral Daily 12/19/18 1432 12/20/18 1424   12/19/18 1430  amoxicillin-clavulanate (AUGMENTIN) 875-125 MG per tablet 1 tablet  Status:  Discontinued     1 tablet Oral Every 12 hours 12/19/18 1427 12/19/18 1431   12/09/18 1200  ceFEPIme (MAXIPIME) 1 g in sodium chloride 0.9 % 100 mL IVPB  Status:  Discontinued     1 g 200 mL/hr over 30 Minutes Intravenous Every 24 hours 12/08/18 1452 12/09/18 0752   12/09/18 0900  cefTRIAXone (ROCEPHIN) 2 g in sodium chloride 0.9 % 100 mL IVPB  Status:  Discontinued     2 g 200 mL/hr over 30 Minutes Intravenous Every 24 hours 12/09/18 0752 12/14/18 1302   12/08/18 1600  azithromycin (ZITHROMAX) 500 mg in sodium chloride 0.9 % 250 mL IVPB  Status:  Discontinued     500 mg 250 mL/hr over 60 Minutes Intravenous Every 24 hours 12/08/18 1508 12/09/18 0752   12/08/18 1452  vancomycin variable dose per unstable renal function (pharmacist dosing)  Status:  Discontinued      Does not apply See admin instructions 12/08/18 1452 12/09/18 0752   12/08/18 1100  vancomycin (VANCOCIN) 2,000 mg in sodium chloride 0.9 % 500 mL IVPB     2,000 mg 250 mL/hr over 120 Minutes Intravenous  Once 12/08/18 1050 12/08/18 1433  12/08/18 1045  ceFEPIme (MAXIPIME) 2 g in sodium chloride 0.9 % 100 mL IVPB     2 g 200 mL/hr over 30 Minutes Intravenous  Once 12/08/18 1031 12/08/18 1241   12/08/18 1045  metroNIDAZOLE (FLAGYL) IVPB 500 mg     500 mg 100 mL/hr over 60 Minutes Intravenous  Once 12/08/18 1031 12/08/18 1326   12/08/18 1045  vancomycin (VANCOCIN) IVPB 1000 mg/200 mL premix  Status:  Discontinued     1,000 mg 200 mL/hr over 60 Minutes Intravenous  Once 12/08/18 1031 12/08/18 1035       Objective: Vitals:   12/20/18 1655  12/20/18 1930 12/20/18 2337 12/21/18 0353  BP: 102/67 (!) 96/58 (!) 99/56 94/67  Pulse: 97 98 96 100  Resp: (!) 21 20 19 18   Temp:  98.1 F (36.7 C) 98.5 F (36.9 C) 98.5 F (36.9 C)  TempSrc:  Oral Oral Oral  SpO2: 100% 98% 97% 100%  Weight:      Height:        Intake/Output Summary (Last 24 hours) at 12/21/2018 1158 Last data filed at 12/21/2018 0344 Gross per 24 hour  Intake 2917.4 ml  Output 1300 ml  Net 1617.4 ml   Filed Weights   12/08/18 1200 12/08/18 2050 12/09/18 0300  Weight: 86.9 kg 60.8 kg 50 kg   Weight change:   Body mass index is 13.78 kg/m.  Intake/Output from previous day: 12/10 0701 - 12/11 0700 In: 3157.4 [P.O.:660; I.V.:1964.4; IV Piggyback:533] Out: 3150 [Urine:3150] Intake/Output this shift: No intake/output data recorded.  Examination:  General exam: Alert awake not making eye contact not very vocal but denies complaint on room air  HEENT:Oral mucosa moist, Ear/Nose WNL grossly, dentition normal. Respiratory system: Bilaterally clear breath sounds, no use of accessory muscle Cardiovascular system: S1 & S2 +, No JVD,. Gastrointestinal system: Abdomen soft, NT,ND, BS+ Nervous System:Alert, awake, moving extremities and grossly nonfocal Extremities: No edema, distal peripheral pulses palpable.  Skin: No rashes,no icterus. MSK: Normal muscle bulk,tone, power  Medications:  Scheduled Meds: . apixaban  5 mg Oral BID  . Chlorhexidine Gluconate Cloth  6 each Topical Q0600  . darbepoetin (ARANESP) injection - NON-DIALYSIS  60 mcg Subcutaneous Q Mon-1800  . insulin aspart  0-15 Units Subcutaneous TID WC  . insulin aspart  0-5 Units Subcutaneous QHS  . insulin aspart  8 Units Subcutaneous TID WC  . insulin detemir  16 Units Subcutaneous Q24H  . nutrition supplement (JUVEN)  1 packet Oral BID BM  . pantoprazole  40 mg Oral QHS   Continuous Infusions: . sodium chloride 10 mL/hr at 12/15/18 1400  . sodium chloride 125 mL/hr at 12/21/18 0407  .  piperacillin-tazobactam (ZOSYN)  IV 3.375 g (12/21/18 0359)    Data Reviewed: I have personally reviewed following labs and imaging studies  CBC: Recent Labs  Lab 12/17/18 0302 12/18/18 0345 12/19/18 0219 12/20/18 0214 12/21/18 0251  WBC 20.0* 18.1* 17.6* 16.6* 16.0*  NEUTROABS  --   --  14.2*  --   --   HGB 8.5* 7.7* 8.1* 8.1* 7.9*  HCT 26.9* 24.6* 25.3* 26.3* 25.5*  MCV 81.0 81.7 81.1 84.6 85.0  PLT 335 371 404* 441* 272*   Basic Metabolic Panel: Recent Labs  Lab 12/17/18 0302 12/18/18 0345 12/19/18 0219 12/20/18 0214 12/20/18 1152 12/21/18 0251  NA 136 133* 135 132* 133* 141  K 4.4 4.4 4.6 4.8 5.2* 4.7  CL 99 99 102 99 103 110  CO2 23 21* 22 20* 19*  19*  GLUCOSE 128* 258* 148* 238* 525* 49*  BUN 72* 70* 74* 95* 93* 96*  CREATININE 4.69* 4.64* 4.68* 4.58* 4.39* 4.15*  CALCIUM 8.2* 8.3* 8.6* 8.5* 8.1* 8.7*  PHOS 4.7* 4.7* 4.6 4.4  --  3.7   GFR: Estimated Creatinine Clearance: 13.9 mL/min (A) (by C-G formula based on SCr of 4.15 mg/dL (H)). Liver Function Tests: Recent Labs  Lab 12/17/18 0302 12/18/18 0345 12/19/18 0219 12/20/18 0214 12/21/18 0251  ALBUMIN 1.6* 1.6* 1.7* 1.8* 1.8*   No results for input(s): LIPASE, AMYLASE in the last 168 hours. No results for input(s): AMMONIA in the last 168 hours. Coagulation Profile: No results for input(s): INR, PROTIME in the last 168 hours. Cardiac Enzymes: No results for input(s): CKTOTAL, CKMB, CKMBINDEX, TROPONINI in the last 168 hours. BNP (last 3 results) No results for input(s): PROBNP in the last 8760 hours. HbA1C: No results for input(s): HGBA1C in the last 72 hours. CBG: Recent Labs  Lab 12/21/18 0347 12/21/18 0412 12/21/18 0632 12/21/18 0756 12/21/18 1151  GLUCAP 40* 79 215* 158* 364*   Lipid Profile: No results for input(s): CHOL, HDL, LDLCALC, TRIG, CHOLHDL, LDLDIRECT in the last 72 hours. Thyroid Function Tests: No results for input(s): TSH, T4TOTAL, FREET4, T3FREE, THYROIDAB in the last  72 hours. Anemia Panel: No results for input(s): VITAMINB12, FOLATE, FERRITIN, TIBC, IRON, RETICCTPCT in the last 72 hours. Sepsis Labs: Recent Labs  Lab 12/20/18 1152 12/20/18 1429 12/21/18 0251  PROCALCITON 0.78  --  0.72  LATICACIDVEN 2.0* 2.0*  --     No results found for this or any previous visit (from the past 240 hour(s)).    Radiology Studies: No results found.    LOS: 13 days   Time spent: More than 50% of that time was spent in counseling and/or coordination of care.  Antonieta Pert, MD [facility]  12/21/2018, 11:58 AM

## 2018-12-21 NOTE — TOC Progression Note (Signed)
Transition of Care [facility]) - Progression Note    Patient Details  Name: Timothy Madden MRN: [id number] Date of Birth: Nov 05, 1960  [facility] CM/SW [place], [place] Phone Number: 12/21/2018, 10:46 AM  Clinical Narrative:    CSW received number for an aunt at [phone]. Was able to make contact- she identified herself as his aunt Stanton Kidney. She provided pt mothers number as [phone]. CSW will attempt to reach mother to assess community support.    Expected Discharge Plan: [place] Barriers to Discharge: Inadequate or no insurance, No SNF bed  Expected Discharge Plan and Services Expected Discharge Plan: [place] In-house Referral: Clinical Social Work Discharge Planning Services: CM Consult, [facility], Medication Assistance Post Acute Care Choice: [place] arrangements for the past 2 months: Single Family Home   Readmission Risk Interventions Readmission Risk Prevention Plan 11/14/2018  Transportation Screening Complete  PCP or Specialist Appt within 3-5 Days Not Complete  Not Complete comments Clinics to which patient goes do not make appointments-it is walk in.  [place] or Home Care Consult Complete  Social Work Consult for [place] Planning/Counseling Complete  Palliative Care Screening Not Applicable  Medication Review (RN Care Manager) Referral to Pharmacy  Some recent data might be hidden

## 2018-12-21 NOTE — Progress Notes (Signed)
Hypoglycemic Event  CBG: 40 Treatment: 4 oz juice/soda  Symptoms: Hungry  Follow-up CBG: [id number] CBG Result:79  Possible Reasons for Event: Medication regimen: inadequate meal intake  Comments/MD notified:B Kyere via text page    Roselind Rily

## 2018-12-22 LAB — RENAL FUNCTION PANEL
Albumin: 1.8 g/dL — ABNORMAL LOW (ref 3.5–5.0)
Anion gap: 9 (ref 5–15)
BUN: 85 mg/dL — ABNORMAL HIGH (ref 6–20)
CO2: 20 mmol/L — ABNORMAL LOW (ref 22–32)
Calcium: 8.5 mg/dL — ABNORMAL LOW (ref 8.9–10.3)
Chloride: 108 mmol/L (ref 98–111)
Creatinine, Ser: 3.79 mg/dL — ABNORMAL HIGH (ref 0.61–1.24)
GFR calc Af Amer: 19 mL/min — ABNORMAL LOW (ref >60)
GFR calc non Af Amer: 17 mL/min — ABNORMAL LOW (ref >60)
Glucose, Bld: 198 mg/dL — ABNORMAL HIGH (ref 70–99)
Phosphorus: 3.8 mg/dL (ref 2.5–4.6)
Potassium: 5.2 mmol/L — ABNORMAL HIGH (ref 3.5–5.1)
Sodium: 137 mmol/L (ref 135–145)

## 2018-12-22 LAB — PROCALCITONIN: Procalcitonin: 0.45 ng/mL

## 2018-12-22 LAB — CBC
HCT: 27 % — ABNORMAL LOW (ref 39.0–52.0)
Hemoglobin: 8.1 g/dL — ABNORMAL LOW (ref 13.0–17.0)
MCH: 25.4 pg — ABNORMAL LOW (ref 26.0–34.0)
MCHC: 30 g/dL (ref 30.0–36.0)
MCV: 84.6 fL (ref 80.0–100.0)
Platelets: 531 10*3/uL — ABNORMAL HIGH (ref 150–400)
RBC: 3.19 MIL/uL — ABNORMAL LOW (ref 4.22–5.81)
RDW: 16.2 % — ABNORMAL HIGH (ref 11.5–15.5)
WBC: 16.4 10*3/uL — ABNORMAL HIGH (ref 4.0–10.5)
nRBC: 0 % (ref 0.0–0.2)

## 2018-12-22 LAB — GLUCOSE, CAPILLARY
Glucose-Capillary: 139 mg/dL — ABNORMAL HIGH (ref 70–99)
Glucose-Capillary: 185 mg/dL — ABNORMAL HIGH (ref 70–99)
Glucose-Capillary: 168 mg/dL — ABNORMAL HIGH (ref 70–99)
Glucose-Capillary: 168 mg/dL — ABNORMAL HIGH (ref 70–99)

## 2018-12-22 NOTE — Progress Notes (Signed)
PROGRESS NOTE    Timothy Madden  OZD:[id number] DOB: 1960-07-06 DOA: 12/08/2018 PCP: Marliss Coots, NP   Brief Narrative: 58 year old male with history of type 2 diabetes mellitus CKD stage III baseline creatinine 2.5-2.7, previous osteomyelitis, recent DVT,psychiatric issues?  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  Will keep him on 8 u tid novolog, ss and lantus 16 utine bedtime. Monitor and adjust insulin, appreciate diabetes coordinator input  Serratia Sepsis and UTI: Blood cx + for Serratia: Antibiotic completed 12/4.  Leukocytosis unclear etiology. afebrile. Monitor white counts.Chest x-ray 12/8 shows bilateral lower lobe infiltrates consistent with a pneumonia: Patient finished ceftriaxone. Given + cxr, wbc still up and soft BP  changed to zosyn 12/10- cont for 5 days- procal is at 0.72 but significantly below from previous value of 47-54.  Remains afebrile. Recent Labs  Lab 12/18/18 0345 12/19/18 0219 12/20/18 0214 12/21/18 0251 12/22/18 0423  WBC 18.1* 17.6* 16.6* 16.0* 16.4*   Essential hypertension/hypotension: Blood pressure slightly better.  Continue to monitor off of metoprolol.  Acute renal failure superimposed on chronic kidney disease  IV: 2/2 DKA, obstructive uropathy, slow to improve.  Creatinine with slight improvement today as mentioned below.  Fair urine output.  Nephrology signed off stating this is likely his new baseline.  He will follow up with nephrology as an outpatient.   Recent Labs  Lab 12/19/18 0219 12/20/18 0214 12/20/18 1152 12/21/18 0251 12/22/18 0423  BUN 74* 95* 93* 96* 85*  CREATININE 4.68* 4.58* 4.39* 4.15* 3.79*   ?  Ectopic ureters/Bilateral  hydronephrosis: seen by urology did ultrasound kidneys 12/3 repeat 2/4 and 12/5 was discussed with Dr. Claudia Desanctis- no nephrostomies, likely can DC with Foley catheter outpatient follow-up with urology.  Right leg DVT 10/31: continue Eliquis-will need voucher for outpatient.  Anemia of chronic disease, ESA per nephrology.  ferritin high-prohibiting use of Feraheme.  Some drop likely from hemodilution Recent Labs  Lab 12/18/18 0345 12/19/18 0219 12/20/18 0214 12/21/18 0251 12/22/18 0423  HGB 7.7* 8.1* 8.1* 7.9* 8.1*  HCT 24.6* 25.3* 26.3* 25.5* 27.0*   History of psychiatric disorder complicated by difficult social situation/homeless status Education officer, [employer] on board.  Seen by psychiatry.  No need of inpatient psych unit.  Per their recommendation, patient remains on Remeron low-dose at night.  Protein-calorie malnutrition, severe: Continue to augment nutrition, appreciate dietitian input. Add remeron bedtimne  Metabolic acidosis-improved.  Diarrhea secondary to supplementation, now has decreased.  Nutrition: Nutrition Problem: Severe Malnutrition Etiology: chronic illness(uncontrolled DM, suspected gastroparesis) Signs/Symptoms: severe muscle depletion, severe fat depletion Interventions: Boost Breeze  Pressure Ulcer:  Pressure Injury 12/13/18 Sacrum Left Stage II -  Partial thickness loss of dermis presenting as a shallow open ulcer with a red, pink wound bed without slough. (Active)  12/13/18 0800  Location: Sacrum  Location Orientation: Left  Staging: Stage II -  Partial thickness loss of dermis presenting as a shallow open ulcer with a red, pink wound bed without slough.  Wound Description (Comments):   Present on Admission:      Pressure Injury 12/13/18 Sacrum Right Stage II -  Partial thickness loss of dermis presenting as a shallow open ulcer with a red, pink wound bed without slough. (Active)  12/13/18 0800  Location: Sacrum  Location Orientation: Right  Staging: Stage II -   Partial thickness loss of dermis presenting as a shallow open ulcer with a red, pink wound bed without slough.  Wound Description (Comments):   Present on Admission:    Body mass index is 13.78 kg/m.    DVT prophylaxis: eliquis Code Status: FULL Family Communication: plan of care discussed with patient , no family present. Disposition Plan: Remains inpatient pending clinical improvement.  Need skilled nursing facility.  Difficult disposition due to lack of insurance homelessness now with significant comorbidities  Consultants: Nephro, Uro  Procedures: Chart Review  Admission 10/31 through 11/5 lower extremity swelling found to have DVT placed indwelling Foley catheter apparently 07/2018-was found to also have ectopic insertion of the ureters Lateral?  Leading to possible dysfunctional ureterovesicular junction  Admission for DKA 6/18 through 6/22-at that time found to have hypoechoic mass  Admission 10/16 through 11/02/2013 osteomyelitis left finger with amputation at that time  Microbiology:  Antimicrobials: Anti-infectives (From admission, onward)   Start     Dose/Rate Route Frequency Ordered Stop   12/20/18 1600  piperacillin-tazobactam (ZOSYN) IVPB 3.375 g     3.375 g 12.5 mL/hr over 240 Minutes Intravenous Every 12 hours 12/20/18 1429     12/19/18 1445  amoxicillin-clavulanate (AUGMENTIN) 500-125 MG per tablet 500 mg  Status:  Discontinued     1 tablet Oral Daily 12/19/18 1432 12/20/18 1424   12/19/18 1430  amoxicillin-clavulanate (AUGMENTIN) 875-125 MG per tablet 1 tablet  Status:  Discontinued     1 tablet Oral Every 12 hours 12/19/18 1427 12/19/18 1431   12/09/18 1200  ceFEPIme (MAXIPIME) 1 g in sodium chloride 0.9 % 100 mL IVPB  Status:  Discontinued     1 g 200 mL/hr over 30 Minutes Intravenous Every 24 hours 12/08/18 1452 12/09/18 0752   12/09/18 0900  cefTRIAXone (ROCEPHIN) 2 g in sodium chloride 0.9 % 100 mL IVPB  Status:  Discontinued     2 g 200 mL/hr over 30  Minutes Intravenous Every 24 hours 12/09/18 0752 12/14/18 1302   12/08/18 1600  azithromycin (ZITHROMAX) 500 mg in sodium chloride 0.9 % 250 mL IVPB  Status:  Discontinued     500 mg 250 mL/hr over 60 Minutes Intravenous Every 24 hours 12/08/18 1508 12/09/18 0752   12/08/18 1452  vancomycin variable dose per unstable renal function (pharmacist dosing)  Status:  Discontinued      Does not apply See admin instructions 12/08/18 1452 12/09/18 0752   12/08/18 1100  vancomycin (VANCOCIN) 2,000 mg in sodium chloride 0.9 % 500 mL IVPB     2,000 mg 250 mL/hr over 120 Minutes Intravenous  Once 12/08/18 1050 12/08/18 1433   12/08/18 1045  ceFEPIme (MAXIPIME) 2 g in sodium chloride 0.9 % 100 mL IVPB     2 g 200 mL/hr over 30 Minutes Intravenous  Once 12/08/18 1031 12/08/18 1241   12/08/18 1045  metroNIDAZOLE (FLAGYL) IVPB 500 mg     500 mg 100 mL/hr over 60 Minutes Intravenous  Once 12/08/18 1031 12/08/18 1326   12/08/18 1045  vancomycin (VANCOCIN) IVPB 1000 mg/200 mL premix  Status:  Discontinued     1,000 mg 200 mL/hr over 60 Minutes Intravenous  Once 12/08/18 1031 12/08/18 1035     Subjective: Seen and examined.  Patient alert and oriented x3.  Denied any complaints.  Objective: Vitals:   12/21/18 1417 12/21/18 2159 12/22/18 0551 12/22/18 0552  BP: 103/70 115/74 111/62   Pulse: (!) 101 96 (!)  102   Resp: 18     Temp: 98.5 F (36.9 C) 97.9 F (36.6 C)  98.6 F (37 C)  TempSrc: Oral     SpO2: 100% 100% 100%   Weight:      Height:        Intake/Output Summary (Last 24 hours) at 12/22/2018 1318 Last data filed at 12/22/2018 1112 Gross per 24 hour  Intake 340 ml  Output 4450 ml  Net -4110 ml   Filed Weights   12/08/18 1200 12/08/18 2050 12/09/18 0300  Weight: 86.9 kg 60.8 kg 50 kg   Weight change:   Body mass index is 13.78 kg/m.  Intake/Output from previous day: 12/11 0701 - 12/12 0700 In: 720 [P.O.:720] Out: 3900 [Urine:3900] Intake/Output this shift: Total I/O In: 100  [P.O.:100] Out: 1350 [Urine:1350]  Examination:  General exam: Appears calm and comfortable  Respiratory system: Clear to auscultation. Respiratory effort normal. Cardiovascular system: S1 & S2 heard, RRR. No JVD, murmurs, rubs, gallops or clicks. No pedal edema. Gastrointestinal system: Abdomen is nondistended, soft and nontender. No organomegaly or masses felt. Normal bowel sounds heard. Central nervous system: Alert and oriented x3. No focal neurological deficits. Extremities: Symmetric 5 x 5 power. Skin: No rashes, lesions or ulcers.  Psychiatry: Judgement and insight appear poor. Mood & affect flat  Medications:  Scheduled Meds: . apixaban  5 mg Oral BID  . Chlorhexidine Gluconate Cloth  6 each Topical Q0600  . darbepoetin (ARANESP) injection - NON-DIALYSIS  60 mcg Subcutaneous Q Mon-1800  . insulin aspart  0-15 Units Subcutaneous TID WC  . insulin aspart  0-5 Units Subcutaneous QHS  . insulin aspart  8 Units Subcutaneous TID WC  . insulin detemir  16 Units Subcutaneous Q24H  . mirtazapine  7.5 mg Oral QHS  . nutrition supplement (JUVEN)  1 packet Oral BID BM  . pantoprazole  40 mg Oral QHS   Continuous Infusions: . sodium chloride 10 mL/hr at 12/15/18 1400  . sodium chloride 125 mL/hr at 12/22/18 1221  . piperacillin-tazobactam (ZOSYN)  IV 3.375 g (12/22/18 0411)    Data Reviewed: I have personally reviewed following labs and imaging studies  CBC: Recent Labs  Lab 12/18/18 0345 12/19/18 0219 12/20/18 0214 12/21/18 0251 12/22/18 0423  WBC 18.1* 17.6* 16.6* 16.0* 16.4*  NEUTROABS  --  14.2*  --   --   --   HGB 7.7* 8.1* 8.1* 7.9* 8.1*  HCT 24.6* 25.3* 26.3* 25.5* 27.0*  MCV 81.7 81.1 84.6 85.0 84.6  PLT 371 404* 441* 510* 841*   Basic Metabolic Panel: Recent Labs  Lab 12/18/18 0345 12/19/18 0219 12/20/18 0214 12/20/18 1152 12/21/18 0251 12/22/18 0423  NA 133* 135 132* 133* 141 137  K 4.4 4.6 4.8 5.2* 4.7 5.2*  CL 99 102 99 103 110 108  CO2 21* 22 20*  19* 19* 20*  GLUCOSE 258* 148* 238* 525* 49* 198*  BUN 70* 74* 95* 93* 96* 85*  CREATININE 4.64* 4.68* 4.58* 4.39* 4.15* 3.79*  CALCIUM 8.3* 8.6* 8.5* 8.1* 8.7* 8.5*  PHOS 4.7* 4.6 4.4  --  3.7 3.8   GFR: Estimated Creatinine Clearance: 15.2 mL/min (A) (by C-G formula based on SCr of 3.79 mg/dL (H)). Liver Function Tests: Recent Labs  Lab 12/18/18 0345 12/19/18 0219 12/20/18 0214 12/21/18 0251 12/22/18 0423  ALBUMIN 1.6* 1.7* 1.8* 1.8* 1.8*   No results for input(s): LIPASE, AMYLASE in the last 168 hours. No results for input(s): AMMONIA in the last 168 hours. Coagulation  Profile: No results for input(s): INR, PROTIME in the last 168 hours. Cardiac Enzymes: No results for input(s): CKTOTAL, CKMB, CKMBINDEX, TROPONINI in the last 168 hours. BNP (last 3 results) No results for input(s): PROBNP in the last 8760 hours. HbA1C: No results for input(s): HGBA1C in the last 72 hours. CBG: Recent Labs  Lab 12/21/18 1649 12/21/18 2004 12/21/18 2107 12/22/18 0750 12/22/18 1141  GLUCAP 214* 243* 230* 139* 185*   Lipid Profile: No results for input(s): CHOL, HDL, LDLCALC, TRIG, CHOLHDL, LDLDIRECT in the last 72 hours. Thyroid Function Tests: No results for input(s): TSH, T4TOTAL, FREET4, T3FREE, THYROIDAB in the last 72 hours. Anemia Panel: No results for input(s): VITAMINB12, FOLATE, FERRITIN, TIBC, IRON, RETICCTPCT in the last 72 hours. Sepsis Labs: Recent Labs  Lab 12/20/18 1152 12/20/18 1429 12/21/18 0251 12/22/18 0423  PROCALCITON 0.78  --  0.72 0.45  LATICACIDVEN 2.0* 2.0*  --   --     No results found for this or any previous visit (from the past 240 hour(s)).    Radiology Studies: No results found.    LOS: 14 days   Time spent 30 minutes  Darliss Cheney, MD [facility]  12/22/2018, 1:18 PM

## 2018-12-22 NOTE — Plan of Care (Signed)

## 2018-12-23 LAB — GLUCOSE, CAPILLARY
Glucose-Capillary: 133 mg/dL — ABNORMAL HIGH (ref 70–99)
Glucose-Capillary: 193 mg/dL — ABNORMAL HIGH (ref 70–99)
Glucose-Capillary: 223 mg/dL — ABNORMAL HIGH (ref 70–99)
Glucose-Capillary: 97 mg/dL (ref 70–99)

## 2018-12-23 LAB — BASIC METABOLIC PANEL
Anion gap: 12 (ref 5–15)
BUN: 79 mg/dL — ABNORMAL HIGH (ref 6–20)
CO2: 18 mmol/L — ABNORMAL LOW (ref 22–32)
Calcium: 8.6 mg/dL — ABNORMAL LOW (ref 8.9–10.3)
Chloride: 110 mmol/L (ref 98–111)
Creatinine, Ser: 3.67 mg/dL — ABNORMAL HIGH (ref 0.61–1.24)
GFR calc Af Amer: 20 mL/min — ABNORMAL LOW (ref >60)
GFR calc non Af Amer: 17 mL/min — ABNORMAL LOW (ref >60)
Glucose, Bld: 198 mg/dL — ABNORMAL HIGH (ref 70–99)
Potassium: 5.1 mmol/L (ref 3.5–5.1)
Sodium: 140 mmol/L (ref 135–145)

## 2018-12-23 LAB — CBC
HCT: 26.1 % — ABNORMAL LOW (ref 39.0–52.0)
Hemoglobin: 7.8 g/dL — ABNORMAL LOW (ref 13.0–17.0)
MCH: 25.5 pg — ABNORMAL LOW (ref 26.0–34.0)
MCHC: 29.9 g/dL — ABNORMAL LOW (ref 30.0–36.0)
MCV: 85.3 fL (ref 80.0–100.0)
Platelets: 541 10*3/uL — ABNORMAL HIGH (ref 150–400)
RBC: 3.06 MIL/uL — ABNORMAL LOW (ref 4.22–5.81)
RDW: 16.7 % — ABNORMAL HIGH (ref 11.5–15.5)
WBC: 14 10*3/uL — ABNORMAL HIGH (ref 4.0–10.5)
nRBC: 0 % (ref 0.0–0.2)

## 2018-12-23 LAB — PROCALCITONIN: Procalcitonin: 0.34 ng/mL

## 2018-12-23 NOTE — Progress Notes (Signed)
PROGRESS NOTE    Timothy Madden  RXV:[id number] DOB: 05/05/1960 DOA: 12/08/2018 PCP: Marliss Coots, NP   Brief Narrative: 58 year old male with history of type 2 diabetes mellitus CKD stage III baseline creatinine 2.5-2.7, previous osteomyelitis, recent DVT,psychiatric issues?  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  Will keep him on 8 u tid novolog, ss and lantus 16 utine bedtime. Monitor and adjust insulin, appreciate diabetes coordinator input  Serratia Sepsis and UTI: Blood cx + for Serratia/bacteremia, present on admission: Antibiotic completed 12/4.  Leukocytosis, present on admission: unclear etiology. afebrile. Monitor white counts.Chest x-ray 12/8 shows bilateral lower lobe infiltrates consistent with a pneumonia: Patient finished ceftriaxone. Given + cxr, wbc still up and soft BP  changed to zosyn 12/10- cont for 5 days- procal is at 0.72 but significantly below from previous value of 47-54.  Remains afebrile. Recent Labs  Lab 12/19/18 0219 12/20/18 0214 12/21/18 0251 12/22/18 0423 12/23/18 0312  WBC 17.6* 16.6* 16.0* 16.4* 14.0*   Essential hypertension/hypotension: Blood pressure slightly better.  Continue to monitor off of metoprolol.  Acute renal failure superimposed on chronic kidney disease  IV: 2/2 DKA, obstructive uropathy, slow to improve.  Creatinine with slight improvement today as mentioned below.  Fair urine output.  Nephrology signed off stating this is likely his new baseline.  He will follow up with nephrology as an outpatient.   Recent Labs  Lab 12/20/18 0214 12/20/18 1152 12/21/18 0251 12/22/18 0423 12/23/18 0312  BUN 95* 93* 96* 85* 79*  CREATININE  4.58* 4.39* 4.15* 3.79* 3.67*   ?  Ectopic ureters/Bilateral hydronephrosis: seen by urology did ultrasound kidneys 12/3 repeat 2/4 and 12/5 was discussed with Dr. Claudia Desanctis- no nephrostomies, likely can DC with Foley catheter outpatient follow-up with urology.  Right leg DVT 10/31: continue Eliquis-will need voucher for outpatient.  Anemia of chronic disease, ESA per nephrology.  ferritin high-prohibiting use of Feraheme.  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  Seen by psychiatry.  No need of inpatient psych unit.  Per their recommendation, patient remains on Remeron low-dose at night.  Protein-calorie malnutrition, severe: Continue to augment nutrition, appreciate dietitian input. Add remeron bedtimne  Metabolic acidosis-improved.  Diarrhea secondary to supplementation, now has decreased.  Nutrition: Nutrition Problem: Severe Malnutrition Etiology: chronic illness(uncontrolled DM, suspected gastroparesis) Signs/Symptoms: severe muscle depletion, severe fat depletion Interventions: Boost Breeze  Pressure Ulcer, sacrum left a stage II, secondary to stage II, present on admission:  Pressure Injury 12/13/18 Sacrum Left Stage II -  Partial thickness loss of dermis presenting as a shallow open ulcer with a red, pink wound bed without slough. (Active)  12/13/18 0800  Location: Sacrum  Location Orientation: Left  Staging: Stage II -  Partial thickness loss of dermis presenting as a shallow open ulcer with a red, pink wound bed without slough.  Wound Description (Comments):   Present on Admission:      Pressure Injury 12/13/18 Sacrum Right Stage II -  Partial thickness loss of dermis presenting as  a shallow open ulcer with a  red, pink wound bed without slough. (Active)  12/13/18 0800  Location: Sacrum  Location Orientation: Right  Staging: Stage II -  Partial thickness loss of dermis presenting as a shallow open ulcer with a red, pink wound bed without slough.  Wound Description (Comments):   Present on Admission:    Body mass index is 13.78 kg/m.    DVT prophylaxis: eliquis Code Status: FULL Family Communication: plan of care discussed with patient , no family present. Disposition Plan: Remains inpatient pending clinical improvement.  Need skilled nursing facility.  Difficult disposition due to lack of insurance homelessness now with significant comorbidities  Consultants: Nephro, Uro  Procedures: Chart Review  Admission 10/31 through 11/5 lower extremity swelling found to have DVT placed indwelling Foley catheter apparently 07/2018-was found to also have ectopic insertion of the ureters Lateral?  Leading to possible dysfunctional ureterovesicular junction  Admission for DKA 6/18 through 6/22-at that time found to have hypoechoic mass  Admission 10/16 through 11/02/2013 osteomyelitis left finger with amputation at that time  Microbiology:  Antimicrobials: Anti-infectives (From admission, onward)   Start     Dose/Rate Route Frequency Ordered Stop   12/20/18 1600  piperacillin-tazobactam (ZOSYN) IVPB 3.375 g     3.375 g 12.5 mL/hr over 240 Minutes Intravenous Every 12 hours 12/20/18 1429     12/19/18 1445  amoxicillin-clavulanate (AUGMENTIN) 500-125 MG per tablet 500 mg  Status:  Discontinued     1 tablet Oral Daily 12/19/18 1432 12/20/18 1424   12/19/18 1430  amoxicillin-clavulanate (AUGMENTIN) 875-125 MG per tablet 1 tablet  Status:  Discontinued     1 tablet Oral Every 12 hours 12/19/18 1427 12/19/18 1431   12/09/18 1200  ceFEPIme (MAXIPIME) 1 g in sodium chloride 0.9 % 100 mL IVPB  Status:  Discontinued     1 g 200 mL/hr over 30 Minutes Intravenous Every 24 hours 12/08/18 1452 12/09/18 0752     12/09/18 0900  cefTRIAXone (ROCEPHIN) 2 g in sodium chloride 0.9 % 100 mL IVPB  Status:  Discontinued     2 g 200 mL/hr over 30 Minutes Intravenous Every 24 hours 12/09/18 0752 12/14/18 1302   12/08/18 1600  azithromycin (ZITHROMAX) 500 mg in sodium chloride 0.9 % 250 mL IVPB  Status:  Discontinued     500 mg 250 mL/hr over 60 Minutes Intravenous Every 24 hours 12/08/18 1508 12/09/18 0752   12/08/18 1452  vancomycin variable dose per unstable renal function (pharmacist dosing)  Status:  Discontinued      Does not apply See admin instructions 12/08/18 1452 12/09/18 0752   12/08/18 1100  vancomycin (VANCOCIN) 2,000 mg in sodium chloride 0.9 % 500 mL IVPB     2,000 mg 250 mL/hr over 120 Minutes Intravenous  Once 12/08/18 1050 12/08/18 1433   12/08/18 1045  ceFEPIme (MAXIPIME) 2 g in sodium chloride 0.9 % 100 mL IVPB     2 g 200 mL/hr over 30 Minutes Intravenous  Once 12/08/18 1031 12/08/18 1241   12/08/18 1045  metroNIDAZOLE (FLAGYL) IVPB 500 mg     500 mg 100 mL/hr over 60 Minutes Intravenous  Once 12/08/18 1031 12/08/18 1326   12/08/18 1045  vancomycin (VANCOCIN) IVPB 1000 mg/200 mL premix  Status:  Discontinued     1,000 mg 200 mL/hr over 60 Minutes Intravenous  Once 12/08/18 1031 12/08/18 1035     Subjective: Seen and examined.  Comfortable.  No complaints.  Objective: Vitals:   12/22/18 0552 12/22/18  1409 12/22/18 2057 12/23/18 0422  BP:  110/73 102/61 124/69  Pulse:  (!) 106 (!) 107 (!) 103  Resp:   18 17  Temp: 98.6 F (37 C) 98.4 F (36.9 C) 99.5 F (37.5 C) 98.7 F (37.1 C)  TempSrc:  Oral Oral Oral  SpO2:  100% 98% 100%  Weight:      Height:        Intake/Output Summary (Last 24 hours) at 12/23/2018 1125 Last data filed at 12/23/2018 0939 Gross per 24 hour  Intake 240 ml  Output 2550 ml  Net -2310 ml   Filed Weights   12/08/18 1200 12/08/18 2050 12/09/18 0300  Weight: 86.9 kg 60.8 kg 50 kg   Weight change:   Body mass index is 13.78  kg/m.  Intake/Output from previous day: 12/12 0701 - 12/13 0700 In: 100 [P.O.:100] Out: 3900 [Urine:3900] Intake/Output this shift: Total I/O In: 240 [P.O.:240] Out: -   Examination:  General exam: Appears calm and comfortable  Respiratory system: Clear to auscultation. Respiratory effort normal. Cardiovascular system: S1 & S2 heard, RRR. No JVD, murmurs, rubs, gallops or clicks. No pedal edema. Gastrointestinal system: Abdomen is nondistended, soft and nontender. No organomegaly or masses felt. Normal bowel sounds heard. Central nervous system: Alert and oriented. No focal neurological deficits. Extremities: Symmetric 5 x 5 power. Skin: No rashes, lesions or ulcers.  Psychiatry: Judgement and insight appear poor. Mood & affect flat  Medications:  Scheduled Meds: . apixaban  5 mg Oral BID  . Chlorhexidine Gluconate Cloth  6 each Topical Q0600  . darbepoetin (ARANESP) injection - NON-DIALYSIS  60 mcg Subcutaneous Q Mon-1800  . insulin aspart  0-15 Units Subcutaneous TID WC  . insulin aspart  0-5 Units Subcutaneous QHS  . insulin aspart  8 Units Subcutaneous TID WC  . insulin detemir  16 Units Subcutaneous Q24H  . mirtazapine  7.5 mg Oral QHS  . nutrition supplement (JUVEN)  1 packet Oral BID BM  . pantoprazole  40 mg Oral QHS   Continuous Infusions: . sodium chloride 10 mL/hr at 12/15/18 1400  . sodium chloride 125 mL/hr at 12/23/18 0328  . piperacillin-tazobactam (ZOSYN)  IV 3.375 g (12/23/18 0329)    Data Reviewed: I have personally reviewed following labs and imaging studies  CBC: Recent Labs  Lab 12/19/18 0219 12/20/18 0214 12/21/18 0251 12/22/18 0423 12/23/18 0312  WBC 17.6* 16.6* 16.0* 16.4* 14.0*  NEUTROABS 14.2*  --   --   --   --   HGB 8.1* 8.1* 7.9* 8.1* 7.8*  HCT 25.3* 26.3* 25.5* 27.0* 26.1*  MCV 81.1 84.6 85.0 84.6 85.3  PLT 404* 441* 510* 531* 638*   Basic Metabolic Panel: Recent Labs  Lab 12/18/18 0345 12/19/18 0219 12/20/18 0214  12/20/18 1152 12/21/18 0251 12/22/18 0423 12/23/18 0312  NA 133* 135 132* 133* 141 137 140  K 4.4 4.6 4.8 5.2* 4.7 5.2* 5.1  CL 99 102 99 103 110 108 110  CO2 21* 22 20* 19* 19* 20* 18*  GLUCOSE 258* 148* 238* 525* 49* 198* 198*  BUN 70* 74* 95* 93* 96* 85* 79*  CREATININE 4.64* 4.68* 4.58* 4.39* 4.15* 3.79* 3.67*  CALCIUM 8.3* 8.6* 8.5* 8.1* 8.7* 8.5* 8.6*  PHOS 4.7* 4.6 4.4  --  3.7 3.8  --    GFR: Estimated Creatinine Clearance: 15.7 mL/min (A) (by C-G formula based on SCr of 3.67 mg/dL (H)). Liver Function Tests: Recent Labs  Lab 12/18/18 0345 12/19/18 7564 12/20/18 0214 12/21/18 0251  12/22/18 0423  ALBUMIN 1.6* 1.7* 1.8* 1.8* 1.8*   No results for input(s): LIPASE, AMYLASE in the last 168 hours. No results for input(s): AMMONIA in the last 168 hours. Coagulation Profile: No results for input(s): INR, PROTIME in the last 168 hours. Cardiac Enzymes: No results for input(s): CKTOTAL, CKMB, CKMBINDEX, TROPONINI in the last 168 hours. BNP (last 3 results) No results for input(s): PROBNP in the last 8760 hours. HbA1C: No results for input(s): HGBA1C in the last 72 hours. CBG: Recent Labs  Lab 12/22/18 0750 12/22/18 1141 12/22/18 1603 12/22/18 2103 12/23/18 0746  GLUCAP 139* 185* 168* 168* 133*   Lipid Profile: No results for input(s): CHOL, HDL, LDLCALC, TRIG, CHOLHDL, LDLDIRECT in the last 72 hours. Thyroid Function Tests: No results for input(s): TSH, T4TOTAL, FREET4, T3FREE, THYROIDAB in the last 72 hours. Anemia Panel: No results for input(s): VITAMINB12, FOLATE, FERRITIN, TIBC, IRON, RETICCTPCT in the last 72 hours. Sepsis Labs: Recent Labs  Lab 12/20/18 1152 12/20/18 1429 12/21/18 0251 12/22/18 0423 12/23/18 0312  PROCALCITON 0.78  --  0.72 0.45 0.34  LATICACIDVEN 2.0* 2.0*  --   --   --     No results found for this or any previous visit (from the past 240 hour(s)).    Radiology Studies: No results found.    LOS: 15 days   Time spent 27  minutes  Darliss Cheney, MD [facility]  12/23/2018, 11:25 AM

## 2018-12-23 NOTE — Plan of Care (Signed)

## 2018-12-24 LAB — GLUCOSE, CAPILLARY
Glucose-Capillary: 162 mg/dL — ABNORMAL HIGH (ref 70–99)
Glucose-Capillary: 154 mg/dL — ABNORMAL HIGH (ref 70–99)
Glucose-Capillary: 55 mg/dL — ABNORMAL LOW (ref 70–99)
Glucose-Capillary: 100 mg/dL — ABNORMAL HIGH (ref 70–99)
Glucose-Capillary: 68 mg/dL — ABNORMAL LOW (ref 70–99)
Glucose-Capillary: 105 mg/dL — ABNORMAL HIGH (ref 70–99)

## 2018-12-24 NOTE — Progress Notes (Signed)
PROGRESS NOTE    Timothy Madden  KVQ:[id number] DOB: 02-23-1960 DOA: 12/08/2018 PCP: Marliss Coots, NP   Brief Narrative: 58 year old male with history of type 2 diabetes mellitus CKD stage III baseline creatinine 2.5-2.7, previous osteomyelitis, recent DVT,psychiatric issues?  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  Will keep him on 8 u tid novolog, ss and lantus 16 utine bedtime. Monitor and adjust insulin, appreciate diabetes coordinator input  Serratia Sepsis and UTI: Blood cx + for Serratia/bacteremia, present on admission: Antibiotic completed 12/4.  Leukocytosis, present on admission: unclear etiology. afebrile. Monitor white counts.Chest x-ray 12/8 shows bilateral lower lobe infiltrates consistent with a pneumonia: Patient finished ceftriaxone. Given + cxr, wbc still up and soft BP  changed to zosyn 12/10- cont for 5 days- procal is at 0.72 but significantly below from previous value of 47-54.  Remains afebrile. Recent Labs  Lab 12/19/18 0219 12/20/18 0214 12/21/18 0251 12/22/18 0423 12/23/18 0312  WBC 17.6* 16.6* 16.0* 16.4* 14.0*   Essential hypertension/hypotension: Blood pressure slightly better.  Continue to monitor off of metoprolol.  Acute renal failure superimposed on chronic kidney disease  IV: 2/2 DKA, obstructive uropathy, slow to improve.  Creatinine with slight improvement today as mentioned below.  Fair urine output.  Nephrology signed off stating this is likely his new baseline.  He will follow up with nephrology as an outpatient.   Recent Labs  Lab 12/20/18 0214 12/20/18 1152 12/21/18 0251 12/22/18 0423 12/23/18 0312  BUN 95* 93* 96* 85* 79*  CREATININE  4.58* 4.39* 4.15* 3.79* 3.67*   ?  Ectopic ureters/Bilateral hydronephrosis: seen by urology did ultrasound kidneys 12/3 repeat 2/4 and 12/5 was discussed with Dr. Claudia Desanctis- no nephrostomies, likely can DC with Foley catheter outpatient follow-up with urology.  Right leg DVT 10/31: continue Eliquis-will need voucher for outpatient.  Anemia of chronic disease, ESA per nephrology.  ferritin high-prohibiting use of Feraheme.  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  Seen by psychiatry.  No need of inpatient psych unit.  Per their recommendation, patient remains on Remeron low-dose at night.  Protein-calorie malnutrition, severe: Continue to augment nutrition, appreciate dietitian input. Add remeron bedtimne  Metabolic acidosis-improved.  Diarrhea secondary to supplementation, now has decreased.  Nutrition: Nutrition Problem: Severe Malnutrition Etiology: chronic illness(uncontrolled DM, suspected gastroparesis) Signs/Symptoms: severe muscle depletion, severe fat depletion Interventions: Boost Breeze  Pressure Ulcer, sacrum left a stage II, secondary to stage II, present on admission:  Pressure Injury 12/13/18 Sacrum Left Stage II -  Partial thickness loss of dermis presenting as a shallow open ulcer with a red, pink wound bed without slough. (Active)  12/13/18 0800  Location: Sacrum  Location Orientation: Left  Staging: Stage II -  Partial thickness loss of dermis presenting as a shallow open ulcer with a red, pink wound bed without slough.  Wound Description (Comments):   Present on Admission:      Pressure Injury 12/13/18 Sacrum Right Stage II -  Partial thickness loss of dermis presenting as  a shallow open ulcer with a  red, pink wound bed without slough. (Active)  12/13/18 0800  Location: Sacrum  Location Orientation: Right  Staging: Stage II -  Partial thickness loss of dermis presenting as a shallow open ulcer with a red, pink wound bed without slough.  Wound Description (Comments):   Present on Admission:    Body mass index is 13.78 kg/m.    DVT prophylaxis: eliquis Code Status: FULL Family Communication: plan of care discussed with patient , no family present. Disposition Plan: Remains inpatient pending clinical improvement.  Need skilled nursing facility.  Difficult disposition due to lack of insurance homelessness now with significant comorbidities  Consultants: Nephro, Uro  Procedures: Chart Review  Admission 10/31 through 11/5 lower extremity swelling found to have DVT placed indwelling Foley catheter apparently 07/2018-was found to also have ectopic insertion of the ureters Lateral?  Leading to possible dysfunctional ureterovesicular junction  Admission for DKA 6/18 through 6/22-at that time found to have hypoechoic mass  Admission 10/16 through 11/02/2013 osteomyelitis left finger with amputation at that time  Microbiology:  Antimicrobials: Anti-infectives (From admission, onward)   Start     Dose/Rate Route Frequency Ordered Stop   12/20/18 1600  piperacillin-tazobactam (ZOSYN) IVPB 3.375 g     3.375 g 12.5 mL/hr over 240 Minutes Intravenous Every 12 hours 12/20/18 1429     12/19/18 1445  amoxicillin-clavulanate (AUGMENTIN) 500-125 MG per tablet 500 mg  Status:  Discontinued     1 tablet Oral Daily 12/19/18 1432 12/20/18 1424   12/19/18 1430  amoxicillin-clavulanate (AUGMENTIN) 875-125 MG per tablet 1 tablet  Status:  Discontinued     1 tablet Oral Every 12 hours 12/19/18 1427 12/19/18 1431   12/09/18 1200  ceFEPIme (MAXIPIME) 1 g in sodium chloride 0.9 % 100 mL IVPB  Status:  Discontinued     1 g 200 mL/hr over 30 Minutes Intravenous Every 24 hours 12/08/18 1452 12/09/18 0752     12/09/18 0900  cefTRIAXone (ROCEPHIN) 2 g in sodium chloride 0.9 % 100 mL IVPB  Status:  Discontinued     2 g 200 mL/hr over 30 Minutes Intravenous Every 24 hours 12/09/18 0752 12/14/18 1302   12/08/18 1600  azithromycin (ZITHROMAX) 500 mg in sodium chloride 0.9 % 250 mL IVPB  Status:  Discontinued     500 mg 250 mL/hr over 60 Minutes Intravenous Every 24 hours 12/08/18 1508 12/09/18 0752   12/08/18 1452  vancomycin variable dose per unstable renal function (pharmacist dosing)  Status:  Discontinued      Does not apply See admin instructions 12/08/18 1452 12/09/18 0752   12/08/18 1100  vancomycin (VANCOCIN) 2,000 mg in sodium chloride 0.9 % 500 mL IVPB     2,000 mg 250 mL/hr over 120 Minutes Intravenous  Once 12/08/18 1050 12/08/18 1433   12/08/18 1045  ceFEPIme (MAXIPIME) 2 g in sodium chloride 0.9 % 100 mL IVPB     2 g 200 mL/hr over 30 Minutes Intravenous  Once 12/08/18 1031 12/08/18 1241   12/08/18 1045  metroNIDAZOLE (FLAGYL) IVPB 500 mg     500 mg 100 mL/hr over 60 Minutes Intravenous  Once 12/08/18 1031 12/08/18 1326   12/08/18 1045  vancomycin (VANCOCIN) IVPB 1000 mg/200 mL premix  Status:  Discontinued     1,000 mg 200 mL/hr over 60 Minutes Intravenous  Once 12/08/18 1031 12/08/18 1035     Subjective: Seen and examined.  Alert and oriented.  No complaints.  Objective: Vitals:   12/23/18  1454 12/23/18 2100 12/23/18 2200 12/24/18 0418  BP:  132/70  134/74  Pulse:    (!) 108  Resp:  16  16  Temp: 98.4 F (36.9 C) 98.5 F (36.9 C)  98.6 F (37 C)  TempSrc: Oral Oral  Oral  SpO2:   100% 100%  Weight:      Height:        Intake/Output Summary (Last 24 hours) at 12/24/2018 1022 Last data filed at 12/24/2018 0425 Gross per 24 hour  Intake 1926.34 ml  Output 1100 ml  Net 826.34 ml   Filed Weights   12/08/18 1200 12/08/18 2050 12/09/18 0300  Weight: 86.9 kg 60.8 kg 50 kg   Weight change:   Body mass index is 13.78 kg/m.  Intake/Output from previous  day: 12/13 0701 - 12/14 0700 In: 2166.3 [P.O.:600; I.V.:1250; IV Piggyback:316.3] Out: 1100 [Urine:1100] Intake/Output this shift: No intake/output data recorded.  Examination:  General exam: Appears calm and comfortable  Respiratory system: Clear to auscultation. Respiratory effort normal. Cardiovascular system: S1 & S2 heard, RRR. No JVD, murmurs, rubs, gallops or clicks. No pedal edema. Gastrointestinal system: Abdomen is nondistended, soft and nontender. No organomegaly or masses felt. Normal bowel sounds heard. Central nervous system: Alert and oriented. No focal neurological deficits. Extremities: Symmetric 5 x 5 power. Skin: No rashes, lesions or ulcers.  Psychiatry: Judgement and insight appear poor. Mood & affect flat.   Medications:  Scheduled Meds: . apixaban  5 mg Oral BID  . Chlorhexidine Gluconate Cloth  6 each Topical Q0600  . darbepoetin (ARANESP) injection - NON-DIALYSIS  60 mcg Subcutaneous Q Mon-1800  . insulin aspart  0-15 Units Subcutaneous TID WC  . insulin aspart  0-5 Units Subcutaneous QHS  . insulin aspart  8 Units Subcutaneous TID WC  . insulin detemir  16 Units Subcutaneous Q24H  . mirtazapine  7.5 mg Oral QHS  . nutrition supplement (JUVEN)  1 packet Oral BID BM  . pantoprazole  40 mg Oral QHS   Continuous Infusions: . sodium chloride 10 mL/hr at 12/15/18 1400  . sodium chloride 125 mL/hr at 12/24/18 0425  . piperacillin-tazobactam (ZOSYN)  IV 3.375 g (12/24/18 0245)    Data Reviewed: I have personally reviewed following labs and imaging studies  CBC: Recent Labs  Lab 12/19/18 0219 12/20/18 0214 12/21/18 0251 12/22/18 0423 12/23/18 0312  WBC 17.6* 16.6* 16.0* 16.4* 14.0*  NEUTROABS 14.2*  --   --   --   --   HGB 8.1* 8.1* 7.9* 8.1* 7.8*  HCT 25.3* 26.3* 25.5* 27.0* 26.1*  MCV 81.1 84.6 85.0 84.6 85.3  PLT 404* 441* 510* 531* 193*   Basic Metabolic Panel: Recent Labs  Lab 12/18/18 0345 12/19/18 0219 12/20/18 0214 12/20/18 1152  12/21/18 0251 12/22/18 0423 12/23/18 0312  NA 133* 135 132* 133* 141 137 140  K 4.4 4.6 4.8 5.2* 4.7 5.2* 5.1  CL 99 102 99 103 110 108 110  CO2 21* 22 20* 19* 19* 20* 18*  GLUCOSE 258* 148* 238* 525* 49* 198* 198*  BUN 70* 74* 95* 93* 96* 85* 79*  CREATININE 4.64* 4.68* 4.58* 4.39* 4.15* 3.79* 3.67*  CALCIUM 8.3* 8.6* 8.5* 8.1* 8.7* 8.5* 8.6*  PHOS 4.7* 4.6 4.4  --  3.7 3.8  --    GFR: Estimated Creatinine Clearance: 15.7 mL/min (A) (by C-G formula based on SCr of 3.67 mg/dL (H)). Liver Function Tests: Recent Labs  Lab 12/18/18 0345 12/19/18 7902 12/20/18 0214 12/21/18 0251 12/22/18 0423  ALBUMIN 1.6* 1.7* 1.8* 1.8* 1.8*   No results for input(s): LIPASE, AMYLASE in the last 168 hours. No results for input(s): AMMONIA in the last 168 hours. Coagulation Profile: No results for input(s): INR, PROTIME in the last 168 hours. Cardiac Enzymes: No results for input(s): CKTOTAL, CKMB, CKMBINDEX, TROPONINI in the last 168 hours. BNP (last 3 results) No results for input(s): PROBNP in the last 8760 hours. HbA1C: No results for input(s): HGBA1C in the last 72 hours. CBG: Recent Labs  Lab 12/23/18 0746 12/23/18 1153 12/23/18 1657 12/23/18 2122 12/24/18 0827  GLUCAP 133* 193* 223* 97 162*   Lipid Profile: No results for input(s): CHOL, HDL, LDLCALC, TRIG, CHOLHDL, LDLDIRECT in the last 72 hours. Thyroid Function Tests: No results for input(s): TSH, T4TOTAL, FREET4, T3FREE, THYROIDAB in the last 72 hours. Anemia Panel: No results for input(s): VITAMINB12, FOLATE, FERRITIN, TIBC, IRON, RETICCTPCT in the last 72 hours. Sepsis Labs: Recent Labs  Lab 12/20/18 1152 12/20/18 1429 12/21/18 0251 12/22/18 0423 12/23/18 0312  PROCALCITON 0.78  --  0.72 0.45 0.34  LATICACIDVEN 2.0* 2.0*  --   --   --     No results found for this or any previous visit (from the past 240 hour(s)).    Radiology Studies: No results found.    LOS: 16 days   Time spent 26 minutes  Darliss Cheney, MD [facility]  12/24/2018, 10:22 AM

## 2018-12-24 NOTE — Progress Notes (Signed)
Inpatient Diabetes Program Recommendations  AACE/ADA: New Consensus Statement on Inpatient Glycemic Control (2015)  Target Ranges:  Prepandial:   less than 140 mg/dL      Peak postprandial:   less than 180 mg/dL (1-2 hours)      Critically ill patients:  140 - 180 mg/dL   Lab Results  Component Value Date   GLUCAP 68 (L) 12/24/2018   HGBA1C 14.3 (H) 11/10/2018    Review of Glycemic Control Results for AMAAR, OSHITA (MRN [id number]) as of 12/24/2018 18:10  Ref. Range 12/23/2018 21:22 12/24/2018 08:27 12/24/2018 12:41 12/24/2018 17:01 12/24/2018 17:43  Glucose-Capillary Latest Ref Range: 70 - 99 mg/dL 97 162 (H) 154 (H) 55 (L) 68 (L)   Inpatient Diabetes Program Recommendations:  -Decrease Novolog correction to sensitive tid + hs 0-5 units -Decrease Novolog meal coverage to 7 units tid if eats 50%  Thank you, Nani Gasser. Loni Abdon, RN, MSN, CDE  Diabetes Coordinator Inpatient Glycemic Control Team Team Pager [phone] (8am-5pm) 12/24/2018 6:11 PM

## 2018-12-24 NOTE — TOC Progression Note (Signed)
Transition of Care [facility]) - Progression Note    Patient Details  Name: Timothy Madden MRN: [id number] Date of Birth: 16-May-1960  Transition of Care [facility]) CM/SW [place], [place] Phone Number: 12/24/2018, 1:26 PM  Clinical Narrative:    CSW spoke with [facility] leadership Barbette Or. She is aware of barriers with getting in touch with pt mother. We utilized Epic to see if we could identify other contact number in our system. Number listed for Pecoila Barrero in our system also did not work [phone]). Per documentation pt mother is involved with CSW through [facility], have reached out to [facility] for more clarity on home situation since pt unable to provide much background except that he lives with his mother.   TOC team continues to follow- pt unable to care for self, no payor source. At this time CSW has also reached out to financial counselor [employer].    Expected Discharge Plan: [place] Barriers to Discharge: Inadequate or no insurance, No SNF bed  Expected Discharge Plan and Services Expected Discharge Plan: [place] In-house Referral: Clinical Social Work Discharge Planning Services: CM Consult, [place] [facility], Medication Assistance Post Acute Care Choice: [place] arrangements for the past 2 months: Single Family Home    Readmission Risk Interventions Readmission Risk Prevention Plan 11/14/2018  Transportation Screening Complete  PCP or Specialist Appt within 3-5 Days Not Complete  Not Complete comments Clinics to which patient goes do not make appointments-it is walk in.  [place] or Home Care Consult Complete  Social Work Consult for [place] Planning/Counseling Complete  Palliative Care Screening Not Applicable  Medication Review (RN Care Manager) Referral to Pharmacy  Some recent data might be hidden

## 2018-12-24 NOTE — Progress Notes (Signed)
Physical Therapy Treatment Patient Details Name: Timothy Madden MRN: [id number] DOB: 1960/11/16 Today's Date: 12/24/2018    History of Present Illness This is a 58 year old man with a history of numerous psychiatric issues, diabetes poorly controlled supposed to be on home insulin presenting with obtundation from home found to be in severe DKA/HOCM.  PMH DKA, DM, L second finger amputation.    PT Comments    Pt in bed upon PT arrival with poor alignment and positioning. Pt agreeable to PT session with focus on mobility as well as repositioning in bed to improve comfort. Pt was able to demo good bed mobility and transfers, good standing balance with both BUE and single UE support on RW. The pt was able to demo improvement in gait speed both within the session as well as compared to last session. The patient ambulates with a gait speed of 0.5 m/s using RW and with supervision. A gait speed less than 0.79m/s indicates increased risk of falls and dependence in ADLs, and therefore the patient will continue to benefit from skilled PT to address limitations in functional endurance and mobility.     Follow Up Recommendations  SNF     Equipment Recommendations  Rolling walker with 5" wheels    Recommendations for Other Services       Precautions / Restrictions Precautions Precautions: Fall Restrictions Weight Bearing Restrictions: No    Mobility  Bed Mobility Overal bed mobility: Needs Assistance Bed Mobility: Supine to Sit     Supine to sit: Supervision     General bed mobility comments: Pt able to move to EOB with sig extra time and VCs for each part of sequence, especially for BLE movement.  Transfers Overall transfer level: Needs assistance Equipment used: Rolling walker (2 wheeled) Transfers: Sit to/from Stand Sit to Stand: Min assist Stand pivot transfers: Min assist       General transfer comment: Cues for hand placement  Ambulation/Gait Ambulation/Gait assistance:  Min assist;Min guard Gait Distance (Feet): 100 Feet Assistive device: Rolling walker (2 wheeled) Gait Pattern/deviations: Decreased step length - right;Decreased step length - left;Decreased stride length;Shuffle Gait velocity: 0.5 m/s Gait velocity interpretation: <1.8 ft/sec, indicate of risk for recurrent falls General Gait Details: Slow steps and good use of RW to steady; minguard overall for safety. Pt with improvents in gait speed during amb from 0.25 m/s to 0.5 m/s. No LOB throughout   Stairs             Wheelchair Mobility    Modified Rankin (Stroke Patients Only)       Balance Overall balance assessment: Needs assistance Sitting-balance support: Feet supported;No upper extremity supported Sitting balance-Leahy Scale: Good Sitting balance - Comments: once positioned upright with feet on floor balanced without support, hands in his lap, supervision   Standing balance support: Bilateral upper extremity supported Standing balance-Leahy Scale: Poor Standing balance comment: reliant on BUE during amb, able to stand without BUE with static stand                            Cognition Arousal/Alertness: Awake/alert Behavior During Therapy: Flat affect(one word answers, most "yeah" or "yes") Overall Cognitive Status: No family/caregiver present to determine baseline cognitive functioning Area of Impairment: Problem solving;Memory                   Current Attention Level: Focused Memory: Decreased short-term memory Following Commands: Follows one step commands inconsistently;Follows one step commands  with increased time Safety/Judgement: Decreased awareness of deficits   Problem Solving: Slow processing;Decreased initiation;Difficulty sequencing;Requires verbal cues;Requires tactile cues General Comments: Pt agreeable to session, but remained flat affect with minimally worded responses even with open-ended questions      Exercises      General  Comments General comments (skin integrity, edema, etc.): Pt with BM during session, notified RN due to proximity to wound      Pertinent Vitals/Pain Pain Assessment: Faces Faces Pain Scale: Hurts a little bit Pain Location: Grimace during hygeine after BM Pain Descriptors / Indicators: Grimacing Pain Intervention(s): Monitored during session;Repositioned    Home Living                      Prior Function            PT Goals (current goals can now be found in the care plan section) Acute Rehab PT Goals Patient Stated Goal: return home PT Goal Formulation: With patient Time For Goal Achievement: 01/07/19 Potential to Achieve Goals: Fair Progress towards PT goals: Progressing toward goals    Frequency    Min 2X/week      PT Plan Current plan remains appropriate    Co-evaluation              AM-PAC PT "6 Clicks" Mobility   Outcome Measure  Help needed turning from your back to your side while in a flat bed without using bedrails?: A Little Help needed moving from lying on your back to sitting on the side of a flat bed without using bedrails?: A Little Help needed moving to and from a bed to a chair (including a wheelchair)?: A Little Help needed standing up from a chair using your arms (e.g., wheelchair or bedside chair)?: A Little Help needed to walk in hospital room?: A Little Help needed climbing 3-5 steps with a railing? : A Lot 6 Click Score: 17    End of Session Equipment Utilized During Treatment: Gait belt Activity Tolerance: Patient tolerated treatment well Patient left: in chair;with call bell/phone within reach;with chair alarm set Nurse Communication: Mobility status PT Visit Diagnosis: Other abnormalities of gait and mobility (R26.89);Muscle weakness (generalized) (M62.81);Other symptoms and signs involving the nervous system (K99.833)     Time: 8250-5397 PT Time Calculation (min) (ACUTE ONLY): 24 min  Charges:  $Gait Training: 23-37  mins                     Karma Ganja, PT, DPT   Acute Rehabilitation Department [phone]    Otho Bellows 12/24/2018, 4:06 PM

## 2018-12-25 LAB — CBC
HCT: 24.6 % — ABNORMAL LOW (ref 39.0–52.0)
Hemoglobin: 7.7 g/dL — ABNORMAL LOW (ref 13.0–17.0)
MCH: 26.4 pg (ref 26.0–34.0)
MCHC: 31.3 g/dL (ref 30.0–36.0)
MCV: 84.2 fL (ref 80.0–100.0)
Platelets: 532 10*3/uL — ABNORMAL HIGH (ref 150–400)
RBC: 2.92 MIL/uL — ABNORMAL LOW (ref 4.22–5.81)
RDW: 18.1 % — ABNORMAL HIGH (ref 11.5–15.5)
WBC: 11 10*3/uL — ABNORMAL HIGH (ref 4.0–10.5)
nRBC: 0 % (ref 0.0–0.2)

## 2018-12-25 LAB — GLUCOSE, CAPILLARY
Glucose-Capillary: 146 mg/dL — ABNORMAL HIGH (ref 70–99)
Glucose-Capillary: 113 mg/dL — ABNORMAL HIGH (ref 70–99)
Glucose-Capillary: 209 mg/dL — ABNORMAL HIGH (ref 70–99)
Glucose-Capillary: 150 mg/dL — ABNORMAL HIGH (ref 70–99)

## 2018-12-25 MED ORDER — PRO-STAT SUGAR FREE PO LIQD
30.0000 mL | Freq: Two times a day (BID) | ORAL | Status: DC
  Administered 2018-12-25 – 2019-01-18 (×49): 30 mL via ORAL
  Filled 2018-12-25 (×48): qty 30

## 2018-12-25 MED ORDER — INSULIN DETEMIR 100 UNIT/ML ~~LOC~~ SOLN
12.0000 [IU] | SUBCUTANEOUS | Status: DC
  Administered 2018-12-26 – 2018-12-28 (×3): 12 [IU] via SUBCUTANEOUS
  Filled 2018-12-25 (×3): qty 0.12

## 2018-12-25 NOTE — Progress Notes (Addendum)
When administering 0400 Zosyn this RN noticed that the pt.'s foley bag was filled with red colored urine. This RN paged on call clinician for [facility], Baltazar Najjar, NP. Order for a CBC was placed. Will continue to monitor and will endorse to the oncoming shift.

## 2018-12-25 NOTE — Plan of Care (Signed)

## 2018-12-25 NOTE — Progress Notes (Signed)
PROGRESS NOTE    Timothy Madden  BHA:[id number] DOB: 1960-01-17 DOA: 12/08/2018 PCP: Marliss Coots, NP   Brief Narrative: 58 year old male with history of type 2 diabetes mellitus CKD stage III baseline creatinine 2.5-2.7, previous osteomyelitis, recent DVT,psychiatric issues?  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  Will keep him on 8 u tid novolog, ss and reduce Lantus to 12 units from 16 units.   Serratia Sepsis and UTI: Blood cx + for Serratia/bacteremia, present on admission: Antibiotic completed 12/4.  Leukocytosis, present on admission: unclear etiology. afebrile. Monitor white counts.Chest x-ray 12/8 shows bilateral lower lobe infiltrates consistent with a pneumonia: Patient finished ceftriaxone. Given + cxr, wbc still up and soft BP  changed to zosyn 12/10- cont for 5 days- procal is at 0.72 but significantly below from previous value of 47-54.  Remains afebrile. Recent Labs  Lab 12/20/18 0214 12/21/18 0251 12/22/18 0423 12/23/18 0312 12/25/18 0656  WBC 16.6* 16.0* 16.4* 14.0* 11.0*   Essential hypertension/hypotension: Blood pressure very well controlled.  Continue to monitor off of metoprolol.  Acute renal failure superimposed on chronic kidney disease  IV: 2/2 DKA, obstructive uropathy, slow to improve.  Creatinine with slight improvement today as mentioned below.  Fair urine output.  Nephrology signed off stating this is likely his new baseline.  He will follow up with nephrology as an outpatient.   Recent Labs  Lab 12/20/18 0214 12/20/18 1152 12/21/18 0251 12/22/18 0423 12/23/18 0312  BUN 95* 93* 96* 85* 79*  CREATININE 4.58* 4.39*  4.15* 3.79* 3.67*   ?  Ectopic ureters/Bilateral hydronephrosis: seen by urology did ultrasound kidneys 12/3 repeat 2/4 and 12/5 was discussed with Dr. Claudia Desanctis- no nephrostomies, likely can DC with Foley catheter outpatient follow-up with urology.  Right leg DVT 10/31: continue Eliquis-will need voucher for outpatient.  ?  Gross hematuria: Per nursing documentation yesterday, they noticed some hematuria yesterday.  When I saw him, his bag was filled with some urine but clear.  Hemoglobin stable.  Continue Eliquis and watch closely.  Anemia of chronic disease, ESA per nephrology.  ferritin high-prohibiting use of Feraheme.  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  Seen by psychiatry.  No need of inpatient psych unit.  Per their recommendation, patient remains on Remeron low-dose at night.  Protein-calorie malnutrition, severe: Continue to augment nutrition, appreciate dietitian input. Add remeron bedtimne  Metabolic acidosis-improved.  Diarrhea secondary to supplementation, now has decreased.  Nutrition: Nutrition Problem: Severe Malnutrition Etiology: chronic illness(uncontrolled DM, suspected gastroparesis) Signs/Symptoms: severe muscle depletion, severe fat depletion Interventions: Boost Breeze  Pressure Ulcer, sacrum left a stage II, secondary to stage II, present on admission:  Pressure Injury 12/13/18 Sacrum Left Stage II -  Partial thickness loss of dermis presenting as a shallow open ulcer with a red, pink wound bed without slough. (Active)  12/13/18 0800  Location: Sacrum  Location Orientation: Left  Staging: Stage II -  Partial thickness loss of dermis presenting as a shallow open  ulcer with a red, pink wound bed  without slough.  Wound Description (Comments):   Present on Admission:      Pressure Injury 12/13/18 Sacrum Right Stage II -  Partial thickness loss of dermis presenting as a shallow open ulcer with a red, pink wound bed without slough. (Active)  12/13/18 0800  Location: Sacrum  Location Orientation: Right  Staging: Stage II -  Partial thickness loss of dermis presenting as a shallow open ulcer with a red, pink wound bed without slough.  Wound Description (Comments):   Present on Admission:    Body mass index is 13.78 kg/m.    DVT prophylaxis: eliquis Code Status: FULL Family Communication: No family present. Disposition Plan: Remains inpatient pending clinical improvement.  Need skilled nursing facility.  Difficult disposition due to lack of insurance homelessness now with significant comorbidities  Consultants: Nephro, Uro  Procedures: Chart Review  Admission 10/31 through 11/5 lower extremity swelling found to have DVT placed indwelling Foley catheter apparently 07/2018-was found to also have ectopic insertion of the ureters Lateral?  Leading to possible dysfunctional ureterovesicular junction  Admission for DKA 6/18 through 6/22-at that time found to have hypoechoic mass  Admission 10/16 through 11/02/2013 osteomyelitis left finger with amputation at that time  Microbiology:  Antimicrobials: Anti-infectives (From admission, onward)   Start     Dose/Rate Route Frequency Ordered Stop   12/20/18 1600  piperacillin-tazobactam (ZOSYN) IVPB 3.375 g     3.375 g 12.5 mL/hr over 240 Minutes Intravenous Every 12 hours 12/20/18 1429     12/19/18 1445  amoxicillin-clavulanate (AUGMENTIN) 500-125 MG per tablet 500 mg  Status:  Discontinued     1 tablet Oral Daily 12/19/18 1432 12/20/18 1424   12/19/18 1430  amoxicillin-clavulanate (AUGMENTIN) 875-125 MG per tablet 1 tablet  Status:  Discontinued     1 tablet Oral Every 12 hours 12/19/18 1427 12/19/18 1431   12/09/18 1200  ceFEPIme  (MAXIPIME) 1 g in sodium chloride 0.9 % 100 mL IVPB  Status:  Discontinued     1 g 200 mL/hr over 30 Minutes Intravenous Every 24 hours 12/08/18 1452 12/09/18 0752   12/09/18 0900  cefTRIAXone (ROCEPHIN) 2 g in sodium chloride 0.9 % 100 mL IVPB  Status:  Discontinued     2 g 200 mL/hr over 30 Minutes Intravenous Every 24 hours 12/09/18 0752 12/14/18 1302   12/08/18 1600  azithromycin (ZITHROMAX) 500 mg in sodium chloride 0.9 % 250 mL IVPB  Status:  Discontinued     500 mg 250 mL/hr over 60 Minutes Intravenous Every 24 hours 12/08/18 1508 12/09/18 0752   12/08/18 1452  vancomycin variable dose per unstable renal function (pharmacist dosing)  Status:  Discontinued      Does not apply See admin instructions 12/08/18 1452 12/09/18 0752   12/08/18 1100  vancomycin (VANCOCIN) 2,000 mg in sodium chloride 0.9 % 500 mL IVPB     2,000 mg 250 mL/hr over 120 Minutes Intravenous  Once 12/08/18 1050 12/08/18 1433   12/08/18 1045  ceFEPIme (MAXIPIME) 2 g in sodium chloride 0.9 % 100 mL IVPB     2 g 200 mL/hr over 30 Minutes Intravenous  Once 12/08/18 1031 12/08/18 1241   12/08/18 1045  metroNIDAZOLE (FLAGYL) IVPB 500 mg     500 mg 100 mL/hr over 60 Minutes Intravenous  Once 12/08/18 1031 12/08/18 1326   12/08/18 1045  vancomycin (VANCOCIN) IVPB 1000 mg/200 mL premix  Status:  Discontinued     1,000 mg 200 mL/hr  over 60 Minutes Intravenous  Once 12/08/18 1031 12/08/18 1035     Subjective: Seen and examined.  Alert and oriented x3 today.  Has no complaints.  Objective: Vitals:   12/24/18 0418 12/24/18 1600 12/24/18 2112 12/25/18 0535  BP: 134/74 133/83 124/76 122/74  Pulse: (!) 108 (!) 102 94 (!) 103  Resp: 16  14 17   Temp: 98.6 F (37 C) 98.4 F (36.9 C) 98.2 F (36.8 C) 99 F (37.2 C)  TempSrc: Oral Oral Oral Oral  SpO2: 100% 100% 100% 100%  Weight:      Height:        Intake/Output Summary (Last 24 hours) at 12/25/2018 1222 Last data filed at 12/25/2018 0919 Gross per 24 hour   Intake 360 ml  Output 6950 ml  Net -6590 ml   Filed Weights   12/08/18 1200 12/08/18 2050 12/09/18 0300  Weight: 86.9 kg 60.8 kg 50 kg   Weight change:   Body mass index is 13.78 kg/m.  Intake/Output from previous day: 12/14 0701 - 12/15 0700 In: -  Out: 5350 [Urine:5350] Intake/Output this shift: Total I/O In: 360 [P.O.:360] Out: 1600 [Urine:1600]  Examination:  General exam: Appears calm and comfortable  Respiratory system: Clear to auscultation. Respiratory effort normal. Cardiovascular system: S1 & S2 heard, RRR. No JVD, murmurs, rubs, gallops or clicks. No pedal edema. Gastrointestinal system: Abdomen is nondistended, soft and nontender. No organomegaly or masses felt. Normal bowel sounds heard. Central nervous system: Alert and oriented. No focal neurological deficits. Extremities: Symmetric 5 x 5 power. Skin: No rashes, lesions or ulcers.  Psychiatry: Judgement and insight appear poor. Mood & affect flat.   Medications:  Scheduled Meds: . apixaban  5 mg Oral BID  . Chlorhexidine Gluconate Cloth  6 each Topical Q0600  . darbepoetin (ARANESP) injection - NON-DIALYSIS  60 mcg Subcutaneous Q Mon-1800  . insulin aspart  0-15 Units Subcutaneous TID WC  . insulin aspart  0-5 Units Subcutaneous QHS  . insulin aspart  8 Units Subcutaneous TID WC  . insulin detemir  16 Units Subcutaneous Q24H  . mirtazapine  7.5 mg Oral QHS  . nutrition supplement (JUVEN)  1 packet Oral BID BM  . pantoprazole  40 mg Oral QHS   Continuous Infusions: . sodium chloride 10 mL/hr at 12/15/18 1400  . sodium chloride 125 mL/hr at 12/24/18 0425  . piperacillin-tazobactam (ZOSYN)  IV 3.375 g (12/25/18 0444)    Data Reviewed: I have personally reviewed following labs and imaging studies  CBC: Recent Labs  Lab 12/19/18 0219 12/20/18 0214 12/21/18 0251 12/22/18 0423 12/23/18 0312 12/25/18 0656  WBC 17.6* 16.6* 16.0* 16.4* 14.0* 11.0*  NEUTROABS 14.2*  --   --   --   --   --   HGB  8.1* 8.1* 7.9* 8.1* 7.8* 7.7*  HCT 25.3* 26.3* 25.5* 27.0* 26.1* 24.6*  MCV 81.1 84.6 85.0 84.6 85.3 84.2  PLT 404* 441* 510* 531* 541* 846*   Basic Metabolic Panel: Recent Labs  Lab 12/19/18 0219 12/20/18 0214 12/20/18 1152 12/21/18 0251 12/22/18 0423 12/23/18 0312  NA 135 132* 133* 141 137 140  K 4.6 4.8 5.2* 4.7 5.2* 5.1  CL 102 99 103 110 108 110  CO2 22 20* 19* 19* 20* 18*  GLUCOSE 148* 238* 525* 49* 198* 198*  BUN 74* 95* 93* 96* 85* 79*  CREATININE 4.68* 4.58* 4.39* 4.15* 3.79* 3.67*  CALCIUM 8.6* 8.5* 8.1* 8.7* 8.5* 8.6*  PHOS 4.6 4.4  --  3.7 3.8  --  GFR: Estimated Creatinine Clearance: 15.7 mL/min (A) (by C-G formula based on SCr of 3.67 mg/dL (H)). Liver Function Tests: Recent Labs  Lab 12/19/18 0219 12/20/18 0214 12/21/18 0251 12/22/18 0423  ALBUMIN 1.7* 1.8* 1.8* 1.8*   No results for input(s): LIPASE, AMYLASE in the last 168 hours. No results for input(s): AMMONIA in the last 168 hours. Coagulation Profile: No results for input(s): INR, PROTIME in the last 168 hours. Cardiac Enzymes: No results for input(s): CKTOTAL, CKMB, CKMBINDEX, TROPONINI in the last 168 hours. BNP (last 3 results) No results for input(s): PROBNP in the last 8760 hours. HbA1C: No results for input(s): HGBA1C in the last 72 hours. CBG: Recent Labs  Lab 12/24/18 1701 12/24/18 1743 12/24/18 1818 12/24/18 2140 12/25/18 0900  GLUCAP 55* 68* 100* 105* 146*   Lipid Profile: No results for input(s): CHOL, HDL, LDLCALC, TRIG, CHOLHDL, LDLDIRECT in the last 72 hours. Thyroid Function Tests: No results for input(s): TSH, T4TOTAL, FREET4, T3FREE, THYROIDAB in the last 72 hours. Anemia Panel: No results for input(s): VITAMINB12, FOLATE, FERRITIN, TIBC, IRON, RETICCTPCT in the last 72 hours. Sepsis Labs: Recent Labs  Lab 12/20/18 1152 12/20/18 1429 12/21/18 0251 12/22/18 0423 12/23/18 0312  PROCALCITON 0.78  --  0.72 0.45 0.34  LATICACIDVEN 2.0* 2.0*  --   --   --      No results found for this or any previous visit (from the past 240 hour(s)).    Radiology Studies: No results found.    LOS: 17 days   Time spent 27 minutes  Darliss Cheney, MD [facility]  12/25/2018, 12:22 PM

## 2018-12-25 NOTE — Discharge Instructions (Signed)
Information on my medicine - ELIQUIS (apixaban)  This medication education was reviewed with me or my healthcare representative as part of my discharge preparation.   Why was Eliquis prescribed for you? Eliquis was prescribed for your previous history of  blood clot in veins of your legs (deep vein thrombosis) or in your lungs (pulmonary embolism) and to reduce the risk of them occurring again.  What do You need to know about Eliquis ? The dose is ONE 5 mg tablet taken TWICE daily.  Eliquis may be taken with or without food.   Try to take the dose about the same time in the morning and in the evening. If you have difficulty swallowing the tablet whole please discuss with your pharmacist how to take the medication safely.  Take Eliquis exactly as prescribed and DO NOT stop taking Eliquis without talking to the doctor who prescribed the medication.  Stopping may increase your risk of developing a new blood clot.  Refill your prescription before you run out.  After discharge, you should have regular check-up appointments with your healthcare provider that is prescribing your Eliquis.    What do you do if you miss a dose? If a dose of ELIQUIS is not taken at the scheduled time, take it as soon as possible on the same day and twice-daily administration should be resumed. The dose should not be doubled to make up for a missed dose.  Important Safety Information A possible side effect of Eliquis is bleeding. You should call your healthcare provider right away if you experience any of the following: ? Bleeding from an injury or your nose that does not stop. ? Unusual colored urine (red or dark brown) or unusual colored stools (red or black). ? Unusual bruising for unknown reasons. ? A serious fall or if you hit your head (even if there is no bleeding).  Some medicines may interact with Eliquis and might increase your risk of bleeding or clotting while on Eliquis. To help avoid this,  consult your healthcare provider or pharmacist prior to using any new prescription or non-prescription medications, including herbals, vitamins, non-steroidal anti-inflammatory drugs (NSAIDs) and supplements.  This website has more information on Eliquis (apixaban): http://www.eliquis.com/eliquis/home

## 2018-12-25 NOTE — Progress Notes (Signed)
Nutrition Follow-up  RD working remotely.   DOCUMENTATION CODES:   Underweight, Severe malnutrition in context of chronic illness  INTERVENTION:  - continue juven BID. - will order 30 ml prostat BID, each packet provides 100 kcal, 15 grams protein. - continue to encourage PO intakes.    NUTRITION DIAGNOSIS:   Severe Malnutrition related to chronic illness(uncontrolled DM, suspected gastroparesis) as evidenced by severe muscle depletion, severe fat depletion. -ongoing  GOAL:   Patient will meet greater than or equal to 90% of their needs -  MONITOR:   PO intake, Supplement acceptance, Diet advancement  ASSESSMENT:   57 yo male admitted with AMS r/t severe DKA/HHS. PMH includes poorly controlled DM, severe PCM, gastroparesis, numerous psychiatric issues.  12/2- advanced to dysphagia 1 diet with thin liquids 12/4- advanced to dysphagia 2 diet with thin liquids  12/10- advanced to dysphagia 3 diet with carb mod restriction and  thin liquids   Patient reports ongoing good appetite with no difficulties or discomforts eating. He was experiencing diarrhea with Ensure so that was discontinued on 12/4 and juven was ordered BID; consuming all packets of juven since that time. Will trial prostat to increase protein provision.  Per flow sheet documentation, he recently consumed the following: 12/11- 100% of all meals (total of 2228 kcal, 100 grams protein) 12/13- 95% of breakfast, 85% of lunch (total of 1245 kcal, 56 grams protein) 12/15- 75% of breakfast (628 kcal, 22 grams protein)  Per notes: - DKA/type 2 DM with stage 3 CKD hx--last A1c: 14.3% - sepsis 2/2 UTI--completed abx on 12/4 - ARF on CKD 2/2 DKA--improved, Nephrology has signed off - anemia of chronic disease - severe protein calorie malnutrition - metabolic acidosis--improved   Labs reviewed; CBG: 146 mg/dl, BUN: 79 mg/dl, creatinine: 3.67 mg/dl, Ca: 8.6 mg/dl, GFR: 17 ml/min. Medications reviewed; sliding scale  novolog, 8 units novolog TID, 16 units levemir/day. IVF; NS @ 125 ml/hr.    Diet Order:   Diet Order            DIET DYS 3 Room service appropriate? Yes; Fluid consistency: Thin  Diet effective now              EDUCATION NEEDS:   Not appropriate for education at this time  Skin:  Skin Assessment: Skin Integrity Issues: Skin Integrity Issues:: Diabetic Ulcer, Stage II Stage II: lt and rt sacrum Diabetic Ulcer: lt great toe  Last BM:  12/14  Height:   Ht Readings from Last 1 Encounters:  12/08/18 6\' 3"  (1.905 m)    Weight:   Wt Readings from Last 1 Encounters:  12/09/18 50 kg    Ideal Body Weight:  89.1 kg  BMI:  Body mass index is 13.78 kg/m.  Estimated Nutritional Needs:   Kcal:  2000-2200  Protein:  110-125 grams  Fluid:  > 2 L     Jarome Matin, MS, RD, LDN, [facility] Inpatient Clinical Dietitian Pager # [phone] After hours/weekend pager # [phone]

## 2018-12-25 NOTE — TOC Progression Note (Signed)
Transition of Care [facility]) - Progression Note    Patient Details  Name: Timothy Madden MRN: [id number] Date of Birth: 1960/09/09  Transition of Care [facility]) CM/SW [place], [place] Phone Number: 12/25/2018, 12:39 PM  Clinical Narrative:    CSW spoke with pt brother [name] ([phone]) at number provided by Gara Kroner, LCSW who has been working with pt mother. CSW aware pt mother has memory loss and cannot take care of pt needs on top of her own.   Pt brother states he has been working on disability and Medicaid for pt and had gotten some mail about the status of the applications but is unsure what exactly it said. Pt brother thinks that pt would do best in SNF to get stronger before he comes home. [name] has applied for 2 bedroom apartment and wants to assist as he can but right now states he cannot due to multiple reasons provide 24/7 assistance. He says if pt leaves he will go home and eat sweets/drink soda and come back to hospital. CSW explained that we will do our best to find placement but given the multiple barriers we will be in touch with [name] moving forward should pt be able to be managed at home.   TOC team continues to follow, have attempted to get in touch with financial counseling but no answer at this time.   Expected Discharge Plan: [place] Barriers to Discharge: Inadequate or no insurance, No SNF bed, Continued Medical Work up  Expected Discharge Plan and Services Expected Discharge Plan: [place] In-house Referral: Clinical Social Work Discharge Planning Services: CM Consult, [employer], Medication Assistance Post Acute Care Choice: [place] arrangements for the past 2 months: Single Family Home     Readmission Risk Interventions Readmission Risk Prevention Plan 11/14/2018  Transportation Screening Complete  PCP or Specialist Appt within 3-5 Days Not Complete  Not Complete comments  Clinics to which patient goes do not make appointments-it is walk in.  [place] or Home Care Consult Complete  Social Work Consult for [place] Planning/Counseling Complete  Palliative Care Screening Not Applicable  Medication Review (RN Care Manager) Referral to Pharmacy  Some recent data might be hidden

## 2018-12-26 DIAGNOSIS — E875 Hyperkalemia: Secondary | ICD-10-CM

## 2018-12-26 DIAGNOSIS — R739 Hyperglycemia, unspecified: Secondary | ICD-10-CM

## 2018-12-26 DIAGNOSIS — N12 Tubulo-interstitial nephritis, not specified as acute or chronic: Secondary | ICD-10-CM

## 2018-12-26 LAB — GLUCOSE, CAPILLARY
Glucose-Capillary: 153 mg/dL — ABNORMAL HIGH (ref 70–99)
Glucose-Capillary: 157 mg/dL — ABNORMAL HIGH (ref 70–99)
Glucose-Capillary: 78 mg/dL (ref 70–99)
Glucose-Capillary: 231 mg/dL — ABNORMAL HIGH (ref 70–99)

## 2018-12-26 NOTE — TOC Progression Note (Signed)
Transition of Care [facility]) - Progression Note    Patient Details  Name: Timothy Madden MRN: [id number] Date of Birth: 16-Mar-1960  Transition of Care [facility]) CM/SW [place], [place] Phone Number: 12/26/2018, 1:39 PM  Clinical Narrative:    CSW has f/u with Merrily Brittle in hopes of financial counseling assistance with pt, currently have not been able to reach financial counseling to see if pt family has indeed submitted Medicaid application as they have shared with CSW.    Expected Discharge Plan: [place] Barriers to Discharge: Inadequate or no insurance, No SNF bed, Continued Medical Work up  Expected Discharge Plan and Services Expected Discharge Plan: [place] In-house Referral: Clinical Social Work Discharge Planning Services: CM Consult, [employer], Medication Assistance Post Acute Care Choice: [place] arrangements for the past 2 months: Single Family Home   Readmission Risk Interventions Readmission Risk Prevention Plan 11/14/2018  Transportation Screening Complete  PCP or Specialist Appt within 3-5 Days Not Complete  Not Complete comments Clinics to which patient goes do not make appointments-it is walk in.  [place] or Home Care Consult Complete  Social Work Consult for [place] Planning/Counseling Complete  Palliative Care Screening Not Applicable  Medication Review (RN Care Manager) Referral to Pharmacy  Some recent data might be hidden

## 2018-12-26 NOTE — Progress Notes (Signed)
PROGRESS NOTE    Timothy Madden  NLG:[id number] DOB: [birth year] DOA: 12/08/2018 PCP: Marliss Coots, NP   Brief Narrative: 58 year old male with history of type 2 diabetes mellitus CKD stage III baseline creatinine 2.5-2.7, previous osteomyelitis, recent DVT,psychiatric issues?  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 admitted ICU 11/28 obtunded in severe DKA, developed severe AKI creatinine 12 potassium 7, seen by urology nephrology   Assessment & Plan:   Active Problems:   Type 2 diabetes mellitus with stage 3 chronic kidney disease (HCC)   Sepsis ([place])   Essential hypertension   Acute renal failure superimposed on chronic kidney disease (HCC)   DKA, type 1, not at goal [facility])   Protein-calorie malnutrition, severe   Acute lower UTI   Bacteremia due to Gram-negative bacteria   Bilateral hydronephrosis   DVT (deep venous thrombosis) (HCC)   Metabolic acidosis   Anemia due to chronic kidney disease   Pressure injury of skin   #1 DKA-resolved patient with history of type 2 diabetes and hemoglobin A1c of 14.3 on admission.  Continue NovoLog 8 units 3 times a day. Continue SSI. Continue Lantus 12 units nightly. Blood sugar stable between 1 13-2 09 today.  #2 Serratia bacteremia and UTI present on admission finished course of antibiotics 12/14/2018.  DC Zosyn today.  Patient was started on Zosyn 12/10.  #3 essential hypertension blood pressure stable 122/75.  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  Last creatinine 3.67 on 12/23/2018 which is improving.  #5 bilateral hydronephrosis with ectopic ureter plan is to keep the catheter in place and follow-up with urology as an outpatient.  Patient also has gross hematuria monitor closely on Eliquis.  #6 right lower extremity DVT diagnosed 11/10/2018 on Eliquis will need to closely monitor with gross hematuria.  #7 anemia of chronic renal disease.   Stable ESA per nephrology.  Hemoglobin 7.7 yesterday 12/25/2018.  Will repeat in the morning.  #8 severe protein calorie malnutrition appreciate dietary input continue Remeron for appetite stimulation.  #9 pressure ulcer sacrum left stage II present on admission  Pressure Injury 06/28/18 Foot Right black thicken area on ball of foot (Active)  06/28/18 1732  Location: Foot  Location Orientation: Right  Staging:   Wound Description (Comments): black thicken area on ball of foot  Present on Admission: Yes     Pressure Injury 12/13/18 Sacrum Left Stage II -  Partial thickness loss of dermis presenting as a shallow open ulcer with a red, pink wound bed without slough. (Active)  12/13/18 0800  Location: Sacrum  Location Orientation: Left  Staging: Stage II -  Partial thickness loss of dermis presenting as a shallow open ulcer with a red, pink wound bed without slough.  Wound Description (Comments):   Present on Admission:      Pressure Injury 12/13/18 Sacrum Right Stage II -  Partial thickness loss of dermis presenting as a shallow open ulcer with a red, pink wound bed without slough. (Active)  12/13/18 0800  Location: Sacrum  Location Orientation: Right  Staging: Stage II -  Partial thickness loss of dermis presenting as a shallow open ulcer with a red, pink wound bed without slough.  Wound Description (Comments):   Present on Admission:       Nutrition Problem: Severe Malnutrition Etiology: chronic illness(uncontrolled DM, suspected gastroparesis)     Signs/Symptoms: severe muscle depletion, severe fat depletion    Interventions: Boost Breeze  Estimated body mass index is 13.78 kg/m as calculated from the following:   Height as of this encounter: 6\' 3"  (1.905 m).   Weight as of this encounter: 50 kg.  DVT prophylaxis: Eliquis  code Status: Full code Family Communication: None Disposition Plan needs SNF per physical therapy pending clinical improvement difficult  disposition due to lack of insurance patient is homeless now with significant comorbidities.   Consultants:   Nephrology and urology  Procedures: None  antimicrobials none  Subjective: Patient resting in bed reports he slept well has blood in urine he thinks it was not there yesterday.  Denies any pain nausea vomiting.  Objective: Vitals:   12/24/18 2112 12/25/18 0535 12/25/18 2210 12/26/18 0422  BP: 124/76 122/74 112/66 122/75  Pulse: 94 (!) 103 (!) 106 (!) 109  Resp: 14 17 14 15   Temp: 98.2 F (36.8 C) 99 F (37.2 C) 99.3 F (37.4 C) 99.6 F (37.6 C)  TempSrc: Oral Oral Oral Oral  SpO2: 100% 100% 100% 98%  Weight:      Height:        Intake/Output Summary (Last 24 hours) at 12/26/2018 1508 Last data filed at 12/26/2018 0810 Gross per 24 hour  Intake 1182 ml  Output 2800 ml  Net -1618 ml   Filed Weights   12/08/18 1200 12/08/18 2050 12/09/18 0300  Weight: 86.9 kg 60.8 kg 50 kg    Examination: Gross hematuria  General exam: Appears calm and comfortable  Respiratory system: Clear to auscultation. Respiratory effort normal. Cardiovascular system: S1 & S2 heard, RRR. No JVD, murmurs, rubs, gallops or clicks. No pedal edema. Gastrointestinal system: Abdomen is nondistended, soft and nontender. No organomegaly or masses felt. Normal bowel sounds heard. Central nervous system: Alert and oriented. No focal neurological deficits. Extremities: Symmetric 5 x 5 power. Skin: No rashes, lesions or ulcers Psychiatry: Judgement and insight appear normal. Mood & affect appropriate.     Data Reviewed: I have personally reviewed following labs and imaging studies  CBC: Recent Labs  Lab 12/20/18 0214 12/21/18 0251 12/22/18 0423 12/23/18 0312 12/25/18 0656  WBC 16.6* 16.0* 16.4* 14.0* 11.0*  HGB 8.1* 7.9* 8.1* 7.8* 7.7*  HCT 26.3* 25.5* 27.0* 26.1* 24.6*  MCV 84.6 85.0 84.6 85.3 84.2  PLT 441* 510* 531* 541* 696*   Basic Metabolic Panel: Recent Labs  Lab  12/20/18 0214 12/20/18 1152 12/21/18 0251 12/22/18 0423 12/23/18 0312  NA 132* 133* 141 137 140  K 4.8 5.2* 4.7 5.2* 5.1  CL 99 103 110 108 110  CO2 20* 19* 19* 20* 18*  GLUCOSE 238* 525* 49* 198* 198*  BUN 95* 93* 96* 85* 79*  CREATININE 4.58* 4.39* 4.15* 3.79* 3.67*  CALCIUM 8.5* 8.1* 8.7* 8.5* 8.6*  PHOS 4.4  --  3.7 3.8  --    GFR: Estimated Creatinine Clearance: 15.7 mL/min (A) (by C-G formula based on SCr of 3.67 mg/dL (H)). Liver Function Tests: Recent Labs  Lab 12/20/18 0214 12/21/18 0251 12/22/18 0423  ALBUMIN 1.8* 1.8* 1.8*   No results for input(s): LIPASE, AMYLASE in the last 168 hours. No results for input(s): AMMONIA in the last 168 hours. Coagulation Profile: No results for input(s): INR, PROTIME in the last 168 hours. Cardiac Enzymes: No results for input(s): CKTOTAL, CKMB, CKMBINDEX, TROPONINI in the last 168 hours. BNP (last 3 results) No results for input(s): PROBNP in the last 8760 hours. HbA1C: No results for input(s): HGBA1C in the last 72 hours. CBG: Recent Labs  Lab 12/25/18 1200  12/25/18 1708 12/25/18 2157 12/26/18 0741 12/26/18 1232  GLUCAP 113* 209* 150* 153* 157*   Lipid Profile: No results for input(s): CHOL, HDL, LDLCALC, TRIG, CHOLHDL, LDLDIRECT in the last 72 hours. Thyroid Function Tests: No results for input(s): TSH, T4TOTAL, FREET4, T3FREE, THYROIDAB in the last 72 hours. Anemia Panel: No results for input(s): VITAMINB12, FOLATE, FERRITIN, TIBC, IRON, RETICCTPCT in the last 72 hours. Sepsis Labs: Recent Labs  Lab 12/20/18 1152 12/20/18 1429 12/21/18 0251 12/22/18 0423 12/23/18 0312  PROCALCITON 0.78  --  0.72 0.45 0.34  LATICACIDVEN 2.0* 2.0*  --   --   --     No results found for this or any previous visit (from the past 240 hour(s)).       Radiology Studies: No results found.      Scheduled Meds: . apixaban  5 mg Oral BID  . Chlorhexidine Gluconate Cloth  6 each Topical Q0600  . darbepoetin (ARANESP)  injection - NON-DIALYSIS  60 mcg Subcutaneous Q Mon-1800  . feeding supplement (PRO-STAT SUGAR FREE 64)  30 mL Oral BID  . insulin aspart  0-15 Units Subcutaneous TID WC  . insulin aspart  0-5 Units Subcutaneous QHS  . insulin aspart  8 Units Subcutaneous TID WC  . insulin detemir  12 Units Subcutaneous Q24H  . mirtazapine  7.5 mg Oral QHS  . nutrition supplement (JUVEN)  1 packet Oral BID BM  . pantoprazole  40 mg Oral QHS   Continuous Infusions: . sodium chloride 10 mL/hr at 12/15/18 1400  . sodium chloride 125 mL/hr at 12/25/18 1854  . piperacillin-tazobactam (ZOSYN)  IV 3.375 g (12/26/18 0437)     LOS: 18 days     Georgette Shell, MD [facility]  If 7PM-7AM, please contact night-coverage [email or web address] Password [email or web address] 12/26/2018, 3:08 PM

## 2018-12-26 NOTE — Plan of Care (Signed)

## 2018-12-27 LAB — CBC WITH DIFFERENTIAL/PLATELET
Abs Immature Granulocytes: 0.08 10*3/uL — ABNORMAL HIGH (ref 0.00–0.07)
Basophils Absolute: 0.1 10*3/uL (ref 0.0–0.1)
Basophils Relative: 1 %
Eosinophils Absolute: 0.2 10*3/uL (ref 0.0–0.5)
Eosinophils Relative: 2 %
HCT: 25.7 % — ABNORMAL LOW (ref 39.0–52.0)
Hemoglobin: 7.7 g/dL — ABNORMAL LOW (ref 13.0–17.0)
Immature Granulocytes: 1 %
Lymphocytes Relative: 21 %
Lymphs Abs: 2 10*3/uL (ref 0.7–4.0)
MCH: 25.5 pg — ABNORMAL LOW (ref 26.0–34.0)
MCHC: 30 g/dL (ref 30.0–36.0)
MCV: 85.1 fL (ref 80.0–100.0)
Monocytes Absolute: 0.7 10*3/uL (ref 0.1–1.0)
Monocytes Relative: 8 %
Neutro Abs: 6.4 10*3/uL (ref 1.7–7.7)
Neutrophils Relative %: 67 %
Platelets: 629 10*3/uL — ABNORMAL HIGH (ref 150–400)
RBC: 3.02 MIL/uL — ABNORMAL LOW (ref 4.22–5.81)
RDW: 18 % — ABNORMAL HIGH (ref 11.5–15.5)
WBC: 9.4 10*3/uL (ref 4.0–10.5)
nRBC: 0 % (ref 0.0–0.2)

## 2018-12-27 LAB — GLUCOSE, CAPILLARY
Glucose-Capillary: 160 mg/dL — ABNORMAL HIGH (ref 70–99)
Glucose-Capillary: 248 mg/dL — ABNORMAL HIGH (ref 70–99)
Glucose-Capillary: 144 mg/dL — ABNORMAL HIGH (ref 70–99)
Glucose-Capillary: 212 mg/dL — ABNORMAL HIGH (ref 70–99)

## 2018-12-27 LAB — BASIC METABOLIC PANEL
Anion gap: 11 (ref 5–15)
BUN: 66 mg/dL — ABNORMAL HIGH (ref 6–20)
CO2: 19 mmol/L — ABNORMAL LOW (ref 22–32)
Calcium: 8.3 mg/dL — ABNORMAL LOW (ref 8.9–10.3)
Chloride: 111 mmol/L (ref 98–111)
Creatinine, Ser: 3.76 mg/dL — ABNORMAL HIGH (ref 0.61–1.24)
GFR calc Af Amer: 19 mL/min — ABNORMAL LOW (ref >60)
GFR calc non Af Amer: 17 mL/min — ABNORMAL LOW (ref >60)
Glucose, Bld: 183 mg/dL — ABNORMAL HIGH (ref 70–99)
Potassium: 4.8 mmol/L (ref 3.5–5.1)
Sodium: 141 mmol/L (ref 135–145)

## 2018-12-27 NOTE — TOC Progression Note (Signed)
Transition of Care [facility]) - Progression Note    Patient Details  Name: Timothy Madden MRN: [id number] Date of Birth: 1960-09-08  Transition of Care [facility]) CM/SW [place], [place] Phone Number: 12/27/2018, 12:59 PM  Clinical Narrative:    CSW spoke with [name] in financial counseling. Pt does have a pending Medicaid application with [facility]. His caseworker is [employer]. Financial counseling has been in contact with his brother [name] for further completion of paperwork.    Expected Discharge Plan: [place] Barriers to Discharge: Inadequate or no insurance, No SNF bed, Continued Medical Work up  Expected Discharge Plan and Services Expected Discharge Plan: [place] In-house Referral: Clinical Social Work Discharge Planning Services: CM Consult, [employer], Medication Assistance Post Acute Care Choice: [place] arrangements for the past 2 months: Single Family Home  Readmission Risk Interventions Readmission Risk Prevention Plan 11/14/2018  Transportation Screening Complete  PCP or Specialist Appt within 3-5 Days Not Complete  Not Complete comments Clinics to which patient goes do not make appointments-it is walk in.  [place] or Home Care Consult Complete  Social Work Consult for [place] Planning/Counseling Complete  Palliative Care Screening Not Applicable  Medication Review (RN Care Manager) Referral to Pharmacy  Some recent data might be hidden

## 2018-12-27 NOTE — Progress Notes (Signed)
Physical Therapy Treatment Patient Details Name: Timothy Madden MRN: [id number] DOB: [birth year] Today's Date: 12/27/2018    History of Present Illness This is a 58 year old man with a history of numerous psychiatric issues, diabetes poorly controlled supposed to be on home insulin presenting with obtundation from home found to be in severe DKA/HOCM.  PMH DKA, DM, L second finger amputation.    PT Comments    Pt received resting in bed and agreeable to PT. Nursing assistant present taking vitals reporting he has been up in chair for several hours and walked with her this morning and did well. Pt seemed fatigued during session however has been up for several hours prior to PT. Pt performed LE therex with min cuing for correct performance. Pt performed all functional mobility with min guard A. Pt is progressing well towards PT goals. Pt presents with decreased strength, ROM, balance and endurance limiting functional mobility. Pt currently is still at risk for falls. Pt would benefit from further acute PT to improve deficits and decrease fall risk. Recommendation for STR is appropriate.    Follow Up Recommendations  SNF     Equipment Recommendations  Rolling walker with 5" wheels    Recommendations for Other Services       Precautions / Restrictions Precautions Precautions: Fall Restrictions Weight Bearing Restrictions: No    Mobility  Bed Mobility Overal bed mobility: Needs Assistance Bed Mobility: Supine to Sit;Sit to Supine     Supine to sit: Supervision Sit to supine: Supervision   General bed mobility comments: supervision assist, no physical assist required, pt utilized bed rails and increased time/effort  Transfers Overall transfer level: Needs assistance Equipment used: Rolling walker (2 wheeled) Transfers: Sit to/from Stand Sit to Stand: Min guard         General transfer comment: min guard for safety, good carryover of hand placement/use of  RW  Ambulation/Gait Ambulation/Gait assistance: Min guard Gait Distance (Feet): 125 Feet Assistive device: Rolling walker (2 wheeled) Gait Pattern/deviations: Decreased stride length;Shuffle Gait velocity: decreased   General Gait Details: slow and steady gait pattern, increased reliance on RW for balance, min guard for safety, slow gait speed and cadence   Stairs             Wheelchair Mobility    Modified Rankin (Stroke Patients Only)       Balance Overall balance assessment: Needs assistance Sitting-balance support: Feet supported Sitting balance-Leahy Scale: Good Sitting balance - Comments: steady sitting EOB and during LE therex   Standing balance support: Bilateral upper extremity supported Standing balance-Leahy Scale: Fair Standing balance comment: reliant on BUE from Rw for ambulation                            Cognition Arousal/Alertness: Awake/alert Behavior During Therapy: Flat affect Overall Cognitive Status: No family/caregiver present to determine baseline cognitive functioning                                 General Comments: Pt agreeable to session, but remained flat affect with minimally worded responses even with open-ended questions      Exercises Total Joint Exercises Ankle Circles/Pumps: AROM;Both;10 reps Hip ABduction/ADduction: AROM;Both;10 reps Straight Leg Raises: AROM;Both;10 reps Long Arc Quad: AROM;Both;10 reps Marching in Standing: AROM;Both;10 reps;Seated    General Comments        Pertinent Vitals/Pain Pain Assessment: No/denies pain  Home Living                      Prior Function            PT Goals (current goals can now be found in the care plan section) Progress towards PT goals: Progressing toward goals    Frequency    Min 2X/week      PT Plan Current plan remains appropriate    Co-evaluation              AM-PAC PT "6 Clicks" Mobility   Outcome Measure   Help needed turning from your back to your side while in a flat bed without using bedrails?: A Little Help needed moving from lying on your back to sitting on the side of a flat bed without using bedrails?: A Little Help needed moving to and from a bed to a chair (including a wheelchair)?: A Little Help needed standing up from a chair using your arms (e.g., wheelchair or bedside chair)?: A Little Help needed to walk in hospital room?: A Little Help needed climbing 3-5 steps with a railing? : A Lot 6 Click Score: 17    End of Session Equipment Utilized During Treatment: Gait belt Activity Tolerance: Patient tolerated treatment well;Patient limited by fatigue Patient left: in bed;with call bell/phone within reach;with bed alarm set Nurse Communication: Mobility status PT Visit Diagnosis: Other abnormalities of gait and mobility (R26.89);Muscle weakness (generalized) (M62.81);Other symptoms and signs involving the nervous system (R29.898)     Time: 1352-1410 PT Time Calculation (min) (ACUTE ONLY): 18 min  Charges:  $Therapeutic Exercise: 8-22 mins                     Zachary George PT, DPT 3:24 PM,12/27/18    Treveon Bourcier Drucilla Chalet 12/27/2018, 3:21 PM

## 2018-12-27 NOTE — Progress Notes (Signed)
PROGRESS NOTE    Timothy Madden  IWL:[id number] DOB: 1960/11/07 DOA: 12/08/2018 PCP: Marliss Coots, NP   Brief Narrative: 58 year old male with history of type 2 diabetes mellitus CKD stage III baseline creatinine 2.5-2.7, previous osteomyelitis, recent DVT,psychiatric issues? 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 admitted ICU 11/28 obtunded in severe DKA, developed severe AKI creatinine 12 potassium 7, seen by urology nephrology  Assessment & Plan:   Active Problems:   Type 2 diabetes mellitus with stage 3 chronic kidney disease (HCC)   Sepsis ([place])   Essential hypertension   Acute renal failure superimposed on chronic kidney disease (HCC)   DKA, type 1, not at goal [facility])   Protein-calorie malnutrition, severe   Acute lower UTI   Bacteremia due to Gram-negative bacteria   Bilateral hydronephrosis   DVT (deep venous thrombosis) (HCC)   Metabolic acidosis   Anemia due to chronic kidney disease   Pressure injury of skin   Hyperkalemia   Hyperglycemia   Pyelonephritis   #1 DKA-resolved patient with history of type 2 diabetes and hemoglobin A1c of 14.3 on admission.  Continue NovoLog 8 units 3 times a day. Continue SSI. Continue Lantus 12 units nightly. Blood sugar between 78-2 48. May need to adjust the dose of insulin will follow-up in a.m.  #2 Serratia bacteremia and UTI present on admission finished course of antibiotics 12/14/2018.  Patient received Zosyn for 6 days.  #3 essential hypertension blood pressure stable 126/74.  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  Urine clearing today compared to yesterday he had blood-tinged urine.  Creatinine 3.76.  #5 bilateral hydronephrosis with ectopic ureter plan is to keep the catheter in place and follow-up with urology as an outpatient.  Patient also has gross hematuria monitor closely on Eliquis.  #6 right lower extremity DVT diagnosed 11/10/2018 on Eliquis will need to closely  monitor with gross hematuria.  #7 anemia of chronic renal disease.  Stable ESA per nephrology.  Hemoglobin remained stable at 7.7.  #8 severe protein calorie malnutrition appreciate dietary input continue Remeron for appetite stimulation.  #9 pressure ulcer sacrum left stage II present on admission  Pressure Injury 06/28/18 Foot Right black thicken area on ball of foot (Active)  06/28/18 1732  Location: Foot  Location Orientation: Right  Staging:   Wound Description (Comments): black thicken area on ball of foot  Present on Admission: Yes     Pressure Injury 12/13/18 Sacrum Left Stage II -  Partial thickness loss of dermis presenting as a shallow open ulcer with a red, pink wound bed without slough. (Active)  12/13/18 0800  Location: Sacrum  Location Orientation: Left  Staging: Stage II -  Partial thickness loss of dermis presenting as a shallow open ulcer with a red, pink wound bed without slough.  Wound Description (Comments):   Present on Admission:      Pressure Injury 12/13/18 Sacrum Right Stage II -  Partial thickness loss of dermis presenting as a shallow open ulcer with a red, pink wound bed without slough. (Active)  12/13/18 0800  Location: Sacrum  Location Orientation: Right  Staging: Stage II -  Partial thickness loss of dermis presenting as a shallow open ulcer with a red, pink wound bed without slough.  Wound Description (Comments):   Present on Admission:       Nutrition Problem: Severe Malnutrition Etiology: chronic illness(uncontrolled DM, suspected gastroparesis)     Signs/Symptoms: severe muscle depletion, severe fat depletion  Interventions: Boost Breeze  Estimated body mass index is 13.78 kg/m as calculated from the following:   Height as of this encounter: 6\' 3"  (1.905 m).   Weight as of this encounter: 50 kg. DVT prophylaxis: Eliquis  code Status: Full code Family Communication: None Disposition Plan needs SNF per physical therapy  pending clinical improvement difficult disposition due to lack of insurance patient is homeless now with significant comorbidities.   Consultants:   Nephrology and urology  Procedures: None  antimicrobials none   Subjective: He is resting in bed in no acute distress denies any new complaints no chest pain shortness of breath nausea vomiting diarrhea.  Objective: Vitals:   12/26/18 1517 12/26/18 2156 12/27/18 0540 12/27/18 1356  BP: 127/78 139/73 117/76 126/74  Pulse: 99 100 (!) 105 80  Resp: 17 18 17 17   Temp: 98.6 F (37 C) 99 F (37.2 C) 98.7 F (37.1 C) 98.2 F (36.8 C)  TempSrc: Oral Oral Oral Oral  SpO2: 100% 100% 100% 100%  Weight:      Height:        Intake/Output Summary (Last 24 hours) at 12/27/2018 1534 Last data filed at 12/27/2018 1401 Gross per 24 hour  Intake 1080 ml  Output 3600 ml  Net -2520 ml   Filed Weights   12/08/18 1200 12/08/18 2050 12/09/18 0300  Weight: 86.9 kg 60.8 kg 50 kg    Examination: Foley in place urine clearing compared to yesterday.  General exam: Appears calm and comfortable  Respiratory system: Clear to auscultation. Respiratory effort normal. Cardiovascular system: S1 & S2 heard, RRR. No JVD, murmurs, rubs, gallops or clicks. No pedal edema. Gastrointestinal system: Abdomen is nondistended, soft and nontender. No organomegaly or masses felt. Normal bowel sounds heard. Central nervous system: Alert and oriented. No focal neurological deficits. Extremities: Symmetric 5 x 5 power. Skin: No rashes, lesions or ulcers Psychiatry: Judgement and insight appear normal. Mood & affect appropriate.     Data Reviewed: I have personally reviewed following labs and imaging studies  CBC: Recent Labs  Lab 12/21/18 0251 12/22/18 0423 12/23/18 0312 12/25/18 0656 12/27/18 0145  WBC 16.0* 16.4* 14.0* 11.0* 9.4  NEUTROABS  --   --   --   --  6.4  HGB 7.9* 8.1* 7.8* 7.7* 7.7*  HCT 25.5* 27.0* 26.1* 24.6* 25.7*  MCV 85.0 84.6  85.3 84.2 85.1  PLT 510* 531* 541* 532* 315*   Basic Metabolic Panel: Recent Labs  Lab 12/21/18 0251 12/22/18 0423 12/23/18 0312 12/27/18 0145  NA 141 137 140 141  K 4.7 5.2* 5.1 4.8  CL 110 108 110 111  CO2 19* 20* 18* 19*  GLUCOSE 49* 198* 198* 183*  BUN 96* 85* 79* 66*  CREATININE 4.15* 3.79* 3.67* 3.76*  CALCIUM 8.7* 8.5* 8.6* 8.3*  PHOS 3.7 3.8  --   --    GFR: Estimated Creatinine Clearance: 15.3 mL/min (A) (by C-G formula based on SCr of 3.76 mg/dL (H)). Liver Function Tests: Recent Labs  Lab 12/21/18 0251 12/22/18 0423  ALBUMIN 1.8* 1.8*   No results for input(s): LIPASE, AMYLASE in the last 168 hours. No results for input(s): AMMONIA in the last 168 hours. Coagulation Profile: No results for input(s): INR, PROTIME in the last 168 hours. Cardiac Enzymes: No results for input(s): CKTOTAL, CKMB, CKMBINDEX, TROPONINI in the last 168 hours. BNP (last 3 results) No results for input(s): PROBNP in the last 8760 hours. HbA1C: No results for input(s): HGBA1C in the last 72 hours. CBG:  Recent Labs  Lab 12/26/18 1232 12/26/18 1632 12/26/18 2158 12/27/18 0601 12/27/18 1201  GLUCAP 157* 78 231* 160* 248*   Lipid Profile: No results for input(s): CHOL, HDL, LDLCALC, TRIG, CHOLHDL, LDLDIRECT in the last 72 hours. Thyroid Function Tests: No results for input(s): TSH, T4TOTAL, FREET4, T3FREE, THYROIDAB in the last 72 hours. Anemia Panel: No results for input(s): VITAMINB12, FOLATE, FERRITIN, TIBC, IRON, RETICCTPCT in the last 72 hours. Sepsis Labs: Recent Labs  Lab 12/21/18 0251 12/22/18 0423 12/23/18 0312  PROCALCITON 0.72 0.45 0.34    No results found for this or any previous visit (from the past 240 hour(s)).       Radiology Studies: No results found.      Scheduled Meds: . apixaban  5 mg Oral BID  . Chlorhexidine Gluconate Cloth  6 each Topical Q0600  . darbepoetin (ARANESP) injection - NON-DIALYSIS  60 mcg Subcutaneous Q Mon-1800  .  feeding supplement (PRO-STAT SUGAR FREE 64)  30 mL Oral BID  . insulin aspart  0-15 Units Subcutaneous TID WC  . insulin aspart  0-5 Units Subcutaneous QHS  . insulin aspart  8 Units Subcutaneous TID WC  . insulin detemir  12 Units Subcutaneous Q24H  . mirtazapine  7.5 mg Oral QHS  . nutrition supplement (JUVEN)  1 packet Oral BID BM  . pantoprazole  40 mg Oral QHS   Continuous Infusions: . sodium chloride 10 mL/hr at 12/15/18 1400  . sodium chloride 125 mL/hr at 12/27/18 0915     LOS: 19 days     Georgette Shell, MD [facility]  If 7PM-7AM, please contact night-coverage [email or web address] Password [facility] 12/27/2018, 3:34 PM

## 2018-12-28 LAB — GLUCOSE, CAPILLARY
Glucose-Capillary: 313 mg/dL — ABNORMAL HIGH (ref 70–99)
Glucose-Capillary: 139 mg/dL — ABNORMAL HIGH (ref 70–99)
Glucose-Capillary: 192 mg/dL — ABNORMAL HIGH (ref 70–99)
Glucose-Capillary: 169 mg/dL — ABNORMAL HIGH (ref 70–99)

## 2018-12-28 MED ORDER — INSULIN DETEMIR 100 UNIT/ML ~~LOC~~ SOLN
15.0000 [IU] | SUBCUTANEOUS | Status: DC
  Administered 2018-12-29 – 2019-01-03 (×6): 15 [IU] via SUBCUTANEOUS
  Filled 2018-12-28 (×6): qty 0.15

## 2018-12-28 MED ORDER — INSULIN DETEMIR 100 UNIT/ML ~~LOC~~ SOLN
15.0000 [IU] | Freq: Every day | SUBCUTANEOUS | Status: DC
  Filled 2018-12-28: qty 0.15

## 2018-12-28 MED ORDER — INSULIN ASPART 100 UNIT/ML ~~LOC~~ SOLN
10.0000 [IU] | Freq: Three times a day (TID) | SUBCUTANEOUS | Status: DC
  Administered 2018-12-28 – 2019-01-01 (×10): 10 [IU] via SUBCUTANEOUS

## 2018-12-28 NOTE — Progress Notes (Signed)
Foley clogged, flushed per MD instruction but unable to flushed. Foley changed

## 2018-12-28 NOTE — Progress Notes (Addendum)
PROGRESS NOTE    Timothy Madden  RAX:[id number] DOB: [birth year] DOA: 12/08/2018 PCP: Marliss Coots, NP  Brief Narrative:58 year old male with history of type 2 diabetes mellitus CKD stage III baseline creatinine 2.5-2.7, previous osteomyelitis, recent DVT,psychiatric issues? 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 admitted ICU 11/28 obtunded in severe DKA, developed severe AKI creatinine 12 potassium 7, seen by urology nephrology  Assessment & Plan:   Active Problems:   Type 2 diabetes mellitus with stage 3 chronic kidney disease (HCC)   Sepsis ([place] Madden)   Essential hypertension   Acute renal failure superimposed on chronic kidney disease (HCC)   DKA, type 1, not at goal [facility])   Protein-calorie malnutrition, severe   Acute lower UTI   Bacteremia due to Gram-negative bacteria   Bilateral hydronephrosis   DVT (deep venous thrombosis) (HCC)   Metabolic acidosis   Anemia due to chronic kidney disease   Pressure injury of skin   Hyperkalemia   Hyperglycemia   Pyelonephritis  #1 DKA-resolved patient with history of type 2 diabetes and hemoglobin A1c of 14.3 on admission.  Blood sugars 144 through 248 02/13/2011 in the last 24 hours.  I will increase his NovoLog to 10 units 3 times a day continue SSI increase Lantus to 16 units at bedtime.  #2 Serratia bacteremia and UTI present on admission finished course of antibiotics 12/14/2018.  Patient received Zosyn for 6 days.  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  Urine clearing today compared to yesterday he had blood-tinged urine.  Creatinine 3.76.  Stable.  #5 bilateral hydronephrosis with ectopic ureter plan is to keep the catheter in place and follow-up with urology as an outpatient.  Hematuria has been resolved on Eliquis.  #6 right lower extremity DVT diagnosed 11/10/2018 on Eliquis  #7 anemia of chronic renal disease. Stable ESA per  nephrology.  Hemoglobin remained stable at 7.7.  #8 severe protein calorie malnutrition appreciate dietary input continue Remeron for appetite stimulation.  #9 pressure ulcer sacrum left stage II present on admission   Pressure Injury 06/28/18 Foot Right black thicken area on ball of foot (Active)  06/28/18 1732  Location: Foot  Location Orientation: Right  Staging:   Wound Description (Comments): black thicken area on ball of foot  Present on Admission: Yes     Pressure Injury 12/13/18 Sacrum Left Stage II -  Partial thickness loss of dermis presenting as a shallow open ulcer with a red, pink wound bed without slough. (Active)  12/13/18 0800  Location: Sacrum  Location Orientation: Left  Staging: Stage II -  Partial thickness loss of dermis presenting as a shallow open ulcer with a red, pink wound bed without slough.  Wound Description (Comments):   Present on Admission:      Pressure Injury 12/13/18 Sacrum Right Stage II -  Partial thickness loss of dermis presenting as a shallow open ulcer with a red, pink wound bed without slough. (Active)  12/13/18 0800  Location: Sacrum  Location Orientation: Right  Staging: Stage II -  Partial thickness loss of dermis presenting as a shallow open ulcer with a red, pink wound bed without slough.  Wound Description (Comments):   Present on Admission:       Nutrition Problem: Severe Malnutrition Etiology: chronic illness(uncontrolled DM, suspected gastroparesis)     Signs/Symptoms: severe muscle depletion, severe fat depletion    Interventions: Boost Breeze  Estimated body mass index  is 13.78 kg/m as calculated from the following:   Height as of this encounter: 6\' 3"  (1.905 m).   Weight as of this encounter: 50 kg.  DVT prophylaxis:Eliquis  code Status:Full code Family Communication:None Disposition Planneeds SNF per physical therapy difficult disposition due to lack of insurance patient is homeless now with significant  comorbidities.   Consultants:  Nephrology and urology  Procedures:None  antimicrobialsnone  Subjective: No complaints hematuria resolved   Objective: Vitals:   12/27/18 0540 12/27/18 1356 12/27/18 2140 12/28/18 0429  BP: 117/76 126/74 107/67 113/68  Pulse: (!) 105 80 (!) 109 (!) 110  Resp: 17 17 17 16   Temp: 98.7 F (37.1 C) 98.2 F (36.8 C) 99.5 F (37.5 C) 99.2 F (37.3 C)  TempSrc: Oral Oral Oral Oral  SpO2: 100% 100% 100% 100%  Weight:      Height:        Intake/Output Summary (Last 24 hours) at 12/28/2018 1152 Last data filed at 12/28/2018 0900 Gross per 24 hour  Intake 1080 ml  Output 4125 ml  Net -3045 ml   Filed Weights   12/08/18 1200 12/08/18 2050 12/09/18 0300  Weight: 86.9 kg 60.8 kg 50 kg    Examination: Foley draining clear urine  General exam: Appears calm and comfortable  Respiratory system: Clear to auscultation. Respiratory effort normal. Cardiovascular system: S1 & S2 heard, RRR. No JVD, murmurs, rubs, gallops or clicks. No pedal edema. Gastrointestinal system: Abdomen is nondistended, soft and nontender. No organomegaly or masses felt. Normal bowel sounds heard. Central nervous system: Alert and oriented. No focal neurological deficits. Extremities: Symmetric 5 x 5 power. Skin: No rashes, lesions or ulcers Psychiatry: Judgement and insight appear normal. Mood & affect appropriate.     Data Reviewed: I have personally reviewed following labs and imaging studies  CBC: Recent Labs  Lab 12/22/18 0423 12/23/18 0312 12/25/18 0656 12/27/18 0145  WBC 16.4* 14.0* 11.0* 9.4  NEUTROABS  --   --   --  6.4  HGB 8.1* 7.8* 7.7* 7.7*  HCT 27.0* 26.1* 24.6* 25.7*  MCV 84.6 85.3 84.2 85.1  PLT 531* 541* 532* 466*   Basic Metabolic Panel: Recent Labs  Lab 12/22/18 0423 12/23/18 0312 12/27/18 0145  NA 137 140 141  K 5.2* 5.1 4.8  CL 108 110 111  CO2 20* 18* 19*  GLUCOSE 198* 198* 183*  BUN 85* 79* 66*  CREATININE 3.79* 3.67*  3.76*  CALCIUM 8.5* 8.6* 8.3*  PHOS 3.8  --   --    GFR: Estimated Creatinine Clearance: 15.3 mL/min (A) (by C-G formula based on SCr of 3.76 mg/dL (H)). Liver Function Tests: Recent Labs  Lab 12/22/18 0423  ALBUMIN 1.8*   No results for input(s): LIPASE, AMYLASE in the last 168 hours. No results for input(s): AMMONIA in the last 168 hours. Coagulation Profile: No results for input(s): INR, PROTIME in the last 168 hours. Cardiac Enzymes: No results for input(s): CKTOTAL, CKMB, CKMBINDEX, TROPONINI in the last 168 hours. BNP (last 3 results) No results for input(s): PROBNP in the last 8760 hours. HbA1C: No results for input(s): HGBA1C in the last 72 hours. CBG: Recent Labs  Lab 12/27/18 1201 12/27/18 1643 12/27/18 2137 12/28/18 0803 12/28/18 1134  GLUCAP 248* 144* 212* 313* 139*   Lipid Profile: No results for input(s): CHOL, HDL, LDLCALC, TRIG, CHOLHDL, LDLDIRECT in the last 72 hours. Thyroid Function Tests: No results for input(s): TSH, T4TOTAL, FREET4, T3FREE, THYROIDAB in the last 72 hours. Anemia Panel: No results  for input(s): VITAMINB12, FOLATE, FERRITIN, TIBC, IRON, RETICCTPCT in the last 72 hours. Sepsis Labs: Recent Labs  Lab 12/22/18 0423 12/23/18 0312  PROCALCITON 0.45 0.34    No results found for this or any previous visit (from the past 240 hour(s)).       Radiology Studies: No results found.      Scheduled Meds: . apixaban  5 mg Oral BID  . Chlorhexidine Gluconate Cloth  6 each Topical Q0600  . darbepoetin (ARANESP) injection - NON-DIALYSIS  60 mcg Subcutaneous Q Mon-1800  . feeding supplement (PRO-STAT SUGAR FREE 64)  30 mL Oral BID  . insulin aspart  0-15 Units Subcutaneous TID WC  . insulin aspart  0-5 Units Subcutaneous QHS  . insulin aspart  8 Units Subcutaneous TID WC  . insulin detemir  12 Units Subcutaneous Q24H  . mirtazapine  7.5 mg Oral QHS  . nutrition supplement (JUVEN)  1 packet Oral BID BM  . pantoprazole  40 mg Oral  QHS   Continuous Infusions: . sodium chloride 10 mL/hr at 12/15/18 1400  . sodium chloride 125 mL/hr at 12/28/18 6681     LOS: 12 days     Georgette Shell, MD [facility]  If 7PM-7AM, please contact night-coverage [email or web address] Password [email or web address] 12/28/2018, 11:52 AM

## 2018-12-29 LAB — GLUCOSE, CAPILLARY
Glucose-Capillary: 106 mg/dL — ABNORMAL HIGH (ref 70–99)
Glucose-Capillary: 124 mg/dL — ABNORMAL HIGH (ref 70–99)
Glucose-Capillary: 257 mg/dL — ABNORMAL HIGH (ref 70–99)
Glucose-Capillary: 81 mg/dL (ref 70–99)
Glucose-Capillary: 174 mg/dL — ABNORMAL HIGH (ref 70–99)

## 2018-12-29 NOTE — Progress Notes (Addendum)
PROGRESS NOTE    Timothy Madden  MOQ:[id number] DOB: 1960/09/25 DOA: 12/08/2018 PCP: Marliss Coots, NP  Brief Narrative: 58 year old male with history of type 2 diabetes mellitus CKD stage III baseline creatinine 2.5-2.7, previous osteomyelitis, recent DVT,psychiatric issues? 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 admitted ICU 11/28 obtunded in severe DKA, developed severe AKI creatinine 12 potassium 7, seen by urology nephrology   Assessment & Plan:   Active Problems:   Type 2 diabetes mellitus with stage 3 chronic kidney disease (HCC)   Sepsis ([place])   Essential hypertension   Acute renal failure superimposed on chronic kidney disease (HCC)   DKA, type 1, not at goal [facility])   Protein-calorie malnutrition, severe   Acute lower UTI   Bacteremia due to Gram-negative bacteria   Bilateral hydronephrosis   DVT (deep venous thrombosis) (HCC)   Metabolic acidosis   Anemia due to chronic kidney disease   Pressure injury of skin   Hyperkalemia   Hyperglycemia   Pyelonephritis   #1 DKA-resolved patient with history of type 2 diabetes and hemoglobin A1c of 14.3 on admission.  Blood sugars 106 through 257.  His NovoLog and Lantus dose was increased  12/28/18 will continue same for now and readjust as needed.    #2 Serratia bacteremia and UTI present on admission finished course of antibiotics 12/14/2018.Patient received Zosyn for 6 days.  #3 essential hypertension blood pressure 112/62.  Not on any medications.  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  His creatinine has been stable 3.76.  #5 bilateral hydronephrosis with ectopic ureter plan is to keep the catheter in place and follow-up with urology as an outpatient.  Hematuria has been resolved on Eliquis.  #6 right lower extremity DVT diagnosed 11/10/2018 on Eliquis  #7 anemia of chronic renal disease. Stable ESA per nephrology.Hemoglobin remained stable at 7.7.  Follow-up labs in  a.m.  #8 severe protein calorie malnutrition appreciate dietary input continue Remeron for appetite stimulation.  #9 pressure ulcer sacrum left stage II present on admission   Pressure Injury 06/28/18 Foot Right black thicken area on ball of foot (Active)  06/28/18 1732  Location: Foot  Location Orientation: Right  Staging:   Wound Description (Comments): black thicken area on ball of foot  Present on Admission: Yes     Pressure Injury 12/13/18 Sacrum Left Stage II -  Partial thickness loss of dermis presenting as a shallow open ulcer with a red, pink wound bed without slough. (Active)  12/13/18 0800  Location: Sacrum  Location Orientation: Left  Staging: Stage II -  Partial thickness loss of dermis presenting as a shallow open ulcer with a red, pink wound bed without slough.  Wound Description (Comments):   Present on Admission:      Pressure Injury 12/13/18 Sacrum Right Stage II -  Partial thickness loss of dermis presenting as a shallow open ulcer with a red, pink wound bed without slough. (Active)  12/13/18 0800  Location: Sacrum  Location Orientation: Right  Staging: Stage II -  Partial thickness loss of dermis presenting as a shallow open ulcer with a red, pink wound bed without slough.  Wound Description (Comments):   Present on Admission:       Nutrition Problem: Severe Malnutrition Etiology: chronic illness(uncontrolled DM, suspected gastroparesis)     Signs/Symptoms: severe muscle depletion, severe fat depletion    Interventions: Boost Breeze  Estimated body mass index is 13.78 kg/m as calculated from the following:   Height  as of this encounter: 6\' 3"  (1.905 m).   Weight as of this encounter: 50 kg.  DVT prophylaxis:Eliquis  code Status:Full code Family Communication:None Disposition Planneeds SNF per physical therapy difficult disposition due to lack of insurance patient is homeless now with significant  comorbidities.   Consultants:  Nephrology and urology  Procedures:None  antimicrobialsnone   Subjective:  Patient has no complaints resting in bed Foley catheter was replaced yesterday his Foley was not draining urine. Objective: Vitals:   12/28/18 0429 12/28/18 1510 12/28/18 2027 12/29/18 0512  BP: 113/68 116/73 (!) 140/92 112/62  Pulse: (!) 110 (!) 109 (!) 106 (!) 105  Resp: 16 18 18 16   Temp: 99.2 F (37.3 C) 100.1 F (37.8 C) 99.8 F (37.7 C) 98.6 F (37 C)  TempSrc: Oral Oral Oral Oral  SpO2: 100% 100% 100% 100%  Weight:      Height:        Intake/Output Summary (Last 24 hours) at 12/29/2018 1316 Last data filed at 12/29/2018 1143 Gross per 24 hour  Intake 480 ml  Output 5925 ml  Net -5445 ml   Filed Weights   12/08/18 1200 12/08/18 2050 12/09/18 0300  Weight: 86.9 kg 60.8 kg 50 kg    Examination: Foley draining clear urine. General exam: Appears calm and comfortable  Respiratory system: Clear to auscultation. Respiratory effort normal. Cardiovascular system: S1 & S2 heard, RRR. No JVD, murmurs, rubs, gallops or clicks. No pedal edema. Gastrointestinal system: Abdomen is nondistended, soft and nontender. No organomegaly or masses felt. Normal bowel sounds heard. Central nervous system: Alert and oriented. No focal neurological deficits. Extremities: Symmetric 5 x 5 power. Skin: No rashes, lesions or ulcers Psychiatry: Judgement and insight appear normal. Mood & affect appropriate.     Data Reviewed: I have personally reviewed following labs and imaging studies  CBC: Recent Labs  Lab 12/23/18 0312 12/25/18 0656 12/27/18 0145  WBC 14.0* 11.0* 9.4  NEUTROABS  --   --  6.4  HGB 7.8* 7.7* 7.7*  HCT 26.1* 24.6* 25.7*  MCV 85.3 84.2 85.1  PLT 541* 532* 536*   Basic Metabolic Panel: Recent Labs  Lab 12/23/18 0312 12/27/18 0145  NA 140 141  K 5.1 4.8  CL 110 111  CO2 18* 19*  GLUCOSE 198* 183*  BUN 79* 66*  CREATININE 3.67* 3.76*   CALCIUM 8.6* 8.3*   GFR: Estimated Creatinine Clearance: 15.3 mL/min (A) (by C-G formula based on SCr of 3.76 mg/dL (H)). Liver Function Tests: No results for input(s): AST, ALT, ALKPHOS, BILITOT, PROT, ALBUMIN in the last 168 hours. No results for input(s): LIPASE, AMYLASE in the last 168 hours. No results for input(s): AMMONIA in the last 168 hours. Coagulation Profile: No results for input(s): INR, PROTIME in the last 168 hours. Cardiac Enzymes: No results for input(s): CKTOTAL, CKMB, CKMBINDEX, TROPONINI in the last 168 hours. BNP (last 3 results) No results for input(s): PROBNP in the last 8760 hours. HbA1C: No results for input(s): HGBA1C in the last 72 hours. CBG: Recent Labs  Lab 12/28/18 1700 12/28/18 2022 12/29/18 0718 12/29/18 0723 12/29/18 1149  GLUCAP 192* 169* 106* 124* 257*   Lipid Profile: No results for input(s): CHOL, HDL, LDLCALC, TRIG, CHOLHDL, LDLDIRECT in the last 72 hours. Thyroid Function Tests: No results for input(s): TSH, T4TOTAL, FREET4, T3FREE, THYROIDAB in the last 72 hours. Anemia Panel: No results for input(s): VITAMINB12, FOLATE, FERRITIN, TIBC, IRON, RETICCTPCT in the last 72 hours. Sepsis Labs: Recent Labs  Lab 12/23/18 [phone]  PROCALCITON 0.34    No results found for this or any previous visit (from the past 240 hour(s)).       Radiology Studies: No results found.      Scheduled Meds: . apixaban  5 mg Oral BID  . Chlorhexidine Gluconate Cloth  6 each Topical Q0600  . darbepoetin (ARANESP) injection - NON-DIALYSIS  60 mcg Subcutaneous Q Mon-1800  . feeding supplement (PRO-STAT SUGAR FREE 64)  30 mL Oral BID  . insulin aspart  0-15 Units Subcutaneous TID WC  . insulin aspart  0-5 Units Subcutaneous QHS  . insulin aspart  10 Units Subcutaneous TID WC  . insulin detemir  15 Units Subcutaneous Q24H  . mirtazapine  7.5 mg Oral QHS  . nutrition supplement (JUVEN)  1 packet Oral BID BM  . pantoprazole  40 mg Oral QHS    Continuous Infusions: . sodium chloride 10 mL/hr at 12/15/18 1400     LOS: 21 days     Georgette Shell, MD [facility]  If 7PM-7AM, please contact night-coverage [email or web address] Password [email or web address] 12/29/2018, 1:16 PM

## 2018-12-30 LAB — CBC WITH DIFFERENTIAL/PLATELET
Abs Immature Granulocytes: 0.53 10*3/uL — ABNORMAL HIGH (ref 0.00–0.07)
Basophils Absolute: 0.1 10*3/uL (ref 0.0–0.1)
Basophils Relative: 1 %
Eosinophils Absolute: 0.2 10*3/uL (ref 0.0–0.5)
Eosinophils Relative: 2 %
HCT: 24.9 % — ABNORMAL LOW (ref 39.0–52.0)
Hemoglobin: 7.6 g/dL — ABNORMAL LOW (ref 13.0–17.0)
Immature Granulocytes: 5 %
Lymphocytes Relative: 24 %
Lymphs Abs: 2.4 10*3/uL (ref 0.7–4.0)
MCH: 26.1 pg (ref 26.0–34.0)
MCHC: 30.5 g/dL (ref 30.0–36.0)
MCV: 85.6 fL (ref 80.0–100.0)
Monocytes Absolute: 1.2 10*3/uL — ABNORMAL HIGH (ref 0.1–1.0)
Monocytes Relative: 12 %
Neutro Abs: 5.7 10*3/uL (ref 1.7–7.7)
Neutrophils Relative %: 56 %
Platelets: 859 10*3/uL — ABNORMAL HIGH (ref 150–400)
RBC: 2.91 MIL/uL — ABNORMAL LOW (ref 4.22–5.81)
RDW: 18.4 % — ABNORMAL HIGH (ref 11.5–15.5)
WBC: 10.2 10*3/uL (ref 4.0–10.5)
nRBC: 0.3 % — ABNORMAL HIGH (ref 0.0–0.2)

## 2018-12-30 LAB — BASIC METABOLIC PANEL
Anion gap: 8 (ref 5–15)
BUN: 84 mg/dL — ABNORMAL HIGH (ref 6–20)
CO2: 19 mmol/L — ABNORMAL LOW (ref 22–32)
Calcium: 8.4 mg/dL — ABNORMAL LOW (ref 8.9–10.3)
Chloride: 108 mmol/L (ref 98–111)
Creatinine, Ser: 3.99 mg/dL — ABNORMAL HIGH (ref 0.61–1.24)
GFR calc Af Amer: 18 mL/min — ABNORMAL LOW (ref >60)
GFR calc non Af Amer: 16 mL/min — ABNORMAL LOW (ref >60)
Glucose, Bld: 272 mg/dL — ABNORMAL HIGH (ref 70–99)
Potassium: 4.7 mmol/L (ref 3.5–5.1)
Sodium: 135 mmol/L (ref 135–145)

## 2018-12-30 LAB — GLUCOSE, CAPILLARY
Glucose-Capillary: 243 mg/dL — ABNORMAL HIGH (ref 70–99)
Glucose-Capillary: 353 mg/dL — ABNORMAL HIGH (ref 70–99)
Glucose-Capillary: 133 mg/dL — ABNORMAL HIGH (ref 70–99)
Glucose-Capillary: 165 mg/dL — ABNORMAL HIGH (ref 70–99)
Glucose-Capillary: 156 mg/dL — ABNORMAL HIGH (ref 70–99)

## 2018-12-30 NOTE — Progress Notes (Addendum)
PROGRESS NOTE    Timothy Madden  LXB:[id number] DOB: [birth year] DOA: 12/08/2018 PCP: Marliss Coots, NP   Brief Narrative:  58 year old male prior history of type 2 diabetes, stage III CKD, osteomyelitis, DVT, homelessness presents with DKA.  He was initially admitted to ICU for obtundation and DKA, AKI.  He was transferred to [facility] service after DKA was resolved.  His creatinine has stabilized around 2.9.  Assessment & Plan:   Active Problems:   Type 2 diabetes mellitus with stage 3 chronic kidney disease (HCC)   Sepsis ([place])   Essential hypertension   Acute renal failure superimposed on chronic kidney disease (HCC)   DKA, type 1, not at goal [facility])   Protein-calorie malnutrition, severe   Acute lower UTI   Bacteremia due to Gram-negative bacteria   Bilateral hydronephrosis   DVT (deep venous thrombosis) (HCC)   Metabolic acidosis   Anemia due to chronic kidney disease   Pressure injury of skin   Hyperkalemia   Hyperglycemia   Pyelonephritis   Severe DKA Resolved. Hemoglobin A1c 14.3 on admission. CBG (last 3)  Recent Labs    12/30/18 0724 12/30/18 1156 12/30/18 1558  GLUCAP 243* 353* 133*   CBGs still uncontrolled with hyperglycemia Continue with sliding scale insulin increased.  Male NovoLog.    History of Serratia bacteremia and UTI on admission Completed the course of antibiotics.    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    Anemia of chronic disease Probably secondary to CKD ESA as per nephrology Transfuse to keep hemoglobin greater than 7  Thrombocytosis:  Unclear etiology and pt is asymptomatic.    Severe protein calorie malnutrition Patient on Remeron for appetite  stimulation    Stage II sacral pressure ulcer on admission Pressure Injury 06/28/18 Foot Right black thicken area on ball of foot (Active)  06/28/18 1732  Location: Foot (ball of right foot)  Location Orientation: Right  Staging:   Wound Description (Comments): black thicken area on ball of foot  Present on Admission: Yes     Pressure Injury 12/13/18 Sacrum Left Stage II -  Partial thickness loss of dermis presenting as a shallow open ulcer with a red, pink wound bed without slough. (Active)  12/13/18 0800  Location: Sacrum  Location Orientation: Left  Staging: Stage II -  Partial thickness loss of dermis presenting as a shallow open ulcer with a red, pink wound bed without slough.  Wound Description (Comments):   Present on Admission:      Pressure Injury 12/13/18 Sacrum Right Stage II -  Partial thickness loss of dermis presenting as a shallow open ulcer with a red, pink wound bed without slough. (Active)  12/13/18 0800  Location: Sacrum  Location Orientation: Right  Staging: Stage II -  Partial thickness loss of dermis presenting as a shallow open ulcer with a red, pink wound bed without slough.  Wound Description (Comments):   Present on Admission:          DVT prophylaxis: Eliquis Code Status: Full code Family Communication: None at bedside Disposition Plan: SNF placement when bed is available.  Difficult disposition as patient is homeless and does not have any insurance with significant comorbidities.   Consultants:   Nephrology and urology  Procedures: (None Antimicrobials:  Anti-infectives (From admission, onward)   Start     Dose/Rate Route Frequency Ordered Stop   12/20/18 1600  piperacillin-tazobactam (ZOSYN) IVPB 3.375 g  Status:  Discontinued     3.375 g 12.5 mL/hr over 240 Minutes Intravenous Every 12 hours 12/20/18 1429 12/27/18 1053   12/19/18 1445  amoxicillin-clavulanate (AUGMENTIN) 500-125 MG per tablet 500 mg  Status:  Discontinued     1 tablet  Oral Daily 12/19/18 1432 12/20/18 1424   12/19/18 1430  amoxicillin-clavulanate (AUGMENTIN) 875-125 MG per tablet 1 tablet  Status:  Discontinued     1 tablet Oral Every 12 hours 12/19/18 1427 12/19/18 1431   12/09/18 1200  ceFEPIme (MAXIPIME) 1 g in sodium chloride 0.9 % 100 mL IVPB  Status:  Discontinued     1 g 200 mL/hr over 30 Minutes Intravenous Every 24 hours 12/08/18 1452 12/09/18 0752   12/09/18 0900  cefTRIAXone (ROCEPHIN) 2 g in sodium chloride 0.9 % 100 mL IVPB  Status:  Discontinued     2 g 200 mL/hr over 30 Minutes Intravenous Every 24 hours 12/09/18 0752 12/14/18 1302   12/08/18 1600  azithromycin (ZITHROMAX) 500 mg in sodium chloride 0.9 % 250 mL IVPB  Status:  Discontinued     500 mg 250 mL/hr over 60 Minutes Intravenous Every 24 hours 12/08/18 1508 12/09/18 0752   12/08/18 1452  vancomycin variable dose per unstable renal function (pharmacist dosing)  Status:  Discontinued      Does not apply See admin instructions 12/08/18 1452 12/09/18 0752   12/08/18 1100  vancomycin (VANCOCIN) 2,000 mg in sodium chloride 0.9 % 500 mL IVPB     2,000 mg 250 mL/hr over 120 Minutes Intravenous  Once 12/08/18 1050 12/08/18 1433   12/08/18 1045  ceFEPIme (MAXIPIME) 2 g in sodium chloride 0.9 % 100 mL IVPB     2 g 200 mL/hr over 30 Minutes Intravenous  Once 12/08/18 1031 12/08/18 1241   12/08/18 1045  metroNIDAZOLE (FLAGYL) IVPB 500 mg     500 mg 100 mL/hr over 60 Minutes Intravenous  Once 12/08/18 1031 12/08/18 1326   12/08/18 1045  vancomycin (VANCOCIN) IVPB 1000 mg/200 mL premix  Status:  Discontinued     1,000 mg 200 mL/hr over 60 Minutes Intravenous  Once 12/08/18 1031 12/08/18 1035       Subjective: No new complaints, no chest pain shortness of breath, nausea vomiting or abdominal pain. Patient is sitting in the chair and appears comfortable.  Objective: Vitals:   12/29/18 1339 12/29/18 2050 12/30/18 0532 12/30/18 1331  BP: 106/66 128/85 118/78 105/68  Pulse: (!) 109 (!) 109  (!) 101 (!) 102  Resp: 16 18 17 18   Temp: 98.3 F (36.8 C) 99.7 F (37.6 C) 98.7 F (37.1 C) 98.4 F (36.9 C)  TempSrc: Oral Oral Oral Oral  SpO2: 100% 100% 99% 100%  Weight:      Height:        Intake/Output Summary (Last 24 hours) at 12/30/2018 1637 Last data filed at 12/30/2018 1602 Gross per 24 hour  Intake 1380 ml  Output 3500 ml  Net -2120 ml   Filed Weights   12/08/18 1200 12/08/18 2050 12/09/18 0300  Weight: 86.9 kg 60.8 kg 50 kg    Examination:  General exam: Appears calm and comfortable  Respiratory system: Clear to auscultation. Respiratory effort normal. Cardiovascular system: S1 & S2 heard, RRR.  Gastrointestinal system: Abdomen is nondistended, soft and nontender. . Normal bowel sounds heard. Central nervous system:  Alert and oriented. No focal neurological deficits. Extremities: No pedal edema Skin: Stage II sacral pressure injury present on admission Psychiatry:  Mood & affect appropriate.     Data Reviewed: I have personally reviewed following labs and imaging studies  CBC: Recent Labs  Lab 12/25/18 0656 12/27/18 0145 12/30/18 0301  WBC 11.0* 9.4 10.2  NEUTROABS  --  6.4 5.7  HGB 7.7* 7.7* 7.6*  HCT 24.6* 25.7* 24.9*  MCV 84.2 85.1 85.6  PLT 532* 629* 630*   Basic Metabolic Panel: Recent Labs  Lab 12/27/18 0145 12/30/18 0301  NA 141 135  K 4.8 4.7  CL 111 108  CO2 19* 19*  GLUCOSE 183* 272*  BUN 66* 84*  CREATININE 3.76* 3.99*  CALCIUM 8.3* 8.4*   GFR: Estimated Creatinine Clearance: 14.4 mL/min (A) (by C-G formula based on SCr of 3.99 mg/dL (H)). Liver Function Tests: No results for input(s): AST, ALT, ALKPHOS, BILITOT, PROT, ALBUMIN in the last 168 hours. No results for input(s): LIPASE, AMYLASE in the last 168 hours. No results for input(s): AMMONIA in the last 168 hours. Coagulation Profile: No results for input(s): INR, PROTIME in the last 168 hours. Cardiac Enzymes: No results for input(s): CKTOTAL, CKMB, CKMBINDEX,  TROPONINI in the last 168 hours. BNP (last 3 results) No results for input(s): PROBNP in the last 8760 hours. HbA1C: No results for input(s): HGBA1C in the last 72 hours. CBG: Recent Labs  Lab 12/29/18 1554 12/29/18 2052 12/30/18 0724 12/30/18 1156 12/30/18 1558  GLUCAP 81 174* 243* 353* 133*   Lipid Profile: No results for input(s): CHOL, HDL, LDLCALC, TRIG, CHOLHDL, LDLDIRECT in the last 72 hours. Thyroid Function Tests: No results for input(s): TSH, T4TOTAL, FREET4, T3FREE, THYROIDAB in the last 72 hours. Anemia Panel: No results for input(s): VITAMINB12, FOLATE, FERRITIN, TIBC, IRON, RETICCTPCT in the last 72 hours. Sepsis Labs: No results for input(s): PROCALCITON, LATICACIDVEN in the last 168 hours.  No results found for this or any previous visit (from the past 240 hour(s)).       Radiology Studies: No results found.      Scheduled Meds: . apixaban  5 mg Oral BID  . Chlorhexidine Gluconate Cloth  6 each Topical Q0600  . darbepoetin (ARANESP) injection - NON-DIALYSIS  60 mcg Subcutaneous Q Mon-1800  . feeding supplement (PRO-STAT SUGAR FREE 64)  30 mL Oral BID  . insulin aspart  0-15 Units Subcutaneous TID WC  . insulin aspart  0-5 Units Subcutaneous QHS  . insulin aspart  10 Units Subcutaneous TID WC  . insulin detemir  15 Units Subcutaneous Q24H  . mirtazapine  7.5 mg Oral QHS  . nutrition supplement (JUVEN)  1 packet Oral BID BM  . pantoprazole  40 mg Oral QHS   Continuous Infusions: . sodium chloride 10 mL/hr at 12/15/18 1400     LOS: 22 days        Hosie Poisson, MD [facility]  12/30/2018, 4:37 PM

## 2018-12-31 LAB — CBC WITH DIFFERENTIAL/PLATELET
Abs Immature Granulocytes: 0.59 10*3/uL — ABNORMAL HIGH (ref 0.00–0.07)
Basophils Absolute: 0.1 10*3/uL (ref 0.0–0.1)
Basophils Relative: 1 %
Eosinophils Absolute: 0.2 10*3/uL (ref 0.0–0.5)
Eosinophils Relative: 2 %
HCT: 26 % — ABNORMAL LOW (ref 39.0–52.0)
Hemoglobin: 8.1 g/dL — ABNORMAL LOW (ref 13.0–17.0)
Immature Granulocytes: 5 %
Lymphocytes Relative: 21 %
Lymphs Abs: 2.3 10*3/uL (ref 0.7–4.0)
MCH: 26.2 pg (ref 26.0–34.0)
MCHC: 31.2 g/dL (ref 30.0–36.0)
MCV: 84.1 fL (ref 80.0–100.0)
Monocytes Absolute: 1.5 10*3/uL — ABNORMAL HIGH (ref 0.1–1.0)
Monocytes Relative: 14 %
Neutro Abs: 6.1 10*3/uL (ref 1.7–7.7)
Neutrophils Relative %: 57 %
Platelets: 974 10*3/uL (ref 150–400)
RBC: 3.09 MIL/uL — ABNORMAL LOW (ref 4.22–5.81)
RDW: 18.6 % — ABNORMAL HIGH (ref 11.5–15.5)
WBC: 10.8 10*3/uL — ABNORMAL HIGH (ref 4.0–10.5)
nRBC: 0.4 % — ABNORMAL HIGH (ref 0.0–0.2)

## 2018-12-31 LAB — BASIC METABOLIC PANEL
Anion gap: 11 (ref 5–15)
BUN: 94 mg/dL — ABNORMAL HIGH (ref 6–20)
CO2: 19 mmol/L — ABNORMAL LOW (ref 22–32)
Calcium: 8.8 mg/dL — ABNORMAL LOW (ref 8.9–10.3)
Chloride: 104 mmol/L (ref 98–111)
Creatinine, Ser: 4.34 mg/dL — ABNORMAL HIGH (ref 0.61–1.24)
GFR calc Af Amer: 16 mL/min — ABNORMAL LOW (ref >60)
GFR calc non Af Amer: 14 mL/min — ABNORMAL LOW (ref >60)
Glucose, Bld: 384 mg/dL — ABNORMAL HIGH (ref 70–99)
Potassium: 4.9 mmol/L (ref 3.5–5.1)
Sodium: 134 mmol/L — ABNORMAL LOW (ref 135–145)

## 2018-12-31 LAB — GLUCOSE, CAPILLARY
Glucose-Capillary: 168 mg/dL — ABNORMAL HIGH (ref 70–99)
Glucose-Capillary: 394 mg/dL — ABNORMAL HIGH (ref 70–99)
Glucose-Capillary: 107 mg/dL — ABNORMAL HIGH (ref 70–99)
Glucose-Capillary: 256 mg/dL — ABNORMAL HIGH (ref 70–99)

## 2018-12-31 LAB — IRON AND TIBC
Iron: 38 ug/dL — ABNORMAL LOW (ref 45–182)
Saturation Ratios: 15 % — ABNORMAL LOW (ref 17.9–39.5)
TIBC: 251 ug/dL (ref 250–450)
UIBC: 213 ug/dL

## 2018-12-31 MED ORDER — SODIUM CHLORIDE 0.9 % IV SOLN
510.0000 mg | INTRAVENOUS | Status: AC
  Administered 2018-12-31 – 2019-01-07 (×2): 510 mg via INTRAVENOUS
  Filled 2018-12-31 (×2): qty 17

## 2018-12-31 NOTE — TOC Progression Note (Signed)
Transition of Care [facility]) - Progression Note    Patient Details  Name: Timothy Madden MRN: [id number] Date of Birth: Oct 31, 1960  Transition of Care [facility]) CM/SW [place], [place] Phone Number: 12/31/2018, 1:32 PM  Clinical Narrative:    TOC continuing to follow, at this time pt Medicaid is pending/unclear status of disability- financial counseling is following. Pt will be LOG placement- no offers at this time. Pt unsafe to dc home due to concerns regarding safety of home, no caregiver support, and pt inability to manage chronic care per documentation.    Expected Discharge Plan: [place] Barriers to Discharge: Continued Medical Work up, No SNF bed, Inadequate or no insurance, SNF Pending Medicaid, SNF Pending payor source - LOG  Expected Discharge Plan and Services Expected Discharge Plan: [place] In-house Referral: Clinical Social Work Discharge Planning Services: CM Consult, [facility], Medication Assistance Post Acute Care Choice: [place] arrangements for the past 2 months: Single Family Home                 Readmission Risk Interventions Readmission Risk Prevention Plan 11/14/2018  Transportation Screening Complete  PCP or Specialist Appt within 3-5 Days Not Complete  Not Complete comments Clinics to which patient goes do not make appointments-it is walk in.  [place] or Home Care Consult Complete  Social Work Consult for [place] Planning/Counseling Complete  Palliative Care Screening Not Applicable  Medication Review (RN Care Manager) Referral to Pharmacy  Some recent data might be hidden

## 2018-12-31 NOTE — Progress Notes (Signed)
PROGRESS NOTE    Timothy Madden  DPO:[id number] DOB: April 08, 1960 DOA: 12/08/2018 PCP: Marliss Coots, NP   Brief Narrative:  58 year old male prior history of type 2 diabetes, stage III CKD, osteomyelitis, DVT, homelessness presents with DKA.  He was initially admitted to ICU for obtundation and DKA, AKI.  He was transferred to [facility] service after DKA was resolved.  His creatinine has stabilized around 2.9.  Assessment & Plan:   Active Problems:   Type 2 diabetes mellitus with stage 3 chronic kidney disease (HCC)   Sepsis ([place])   Essential hypertension   Acute renal failure superimposed on chronic kidney disease (HCC)   DKA, type 1, not at goal [facility])   Protein-calorie malnutrition, severe   Acute lower UTI   Bacteremia due to Gram-negative bacteria   Bilateral hydronephrosis   DVT (deep venous thrombosis) (HCC)   Metabolic acidosis   Anemia due to chronic kidney disease   Pressure injury of skin   Hyperkalemia   Hyperglycemia   Pyelonephritis   Severe DKA Resolved. Hemoglobin A1c 14.3 on admission. CBG (last 3)  Recent Labs    12/30/18 2152 12/31/18 0725 12/31/18 1158  GLUCAP 156* 168* 394*   CBGs still uncontrolled with hyperglycemia. Continue with sliding scale insulin , increase NovoLog to 12 units 3 times daily AC    History of Serratia bacteremia and UTI on admission Completed the course of antibiotics    Essential hypertension Well controlled.     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    Bilateral hydronephrosis with ectopic ureter Continue with Foley catheter placement as per urology and follow-up with them as an outpatient.    Anemia of chronic disease Probably secondary to CKD Transfuse to keep hemoglobin greater than 7.  Hemoglobin stable around 8.  Thrombocytosis:  Unclear etiology and pt is asymptomatic.  We will get hematology input for further evaluation.   Severe  protein calorie malnutrition Patient on Remeron for appetite stimulation   Pressure ulcers on admission Stage II sacral pressure ulcer on admission Pressure Injury 06/28/18 Foot Right black thicken area on ball of foot (Active)  06/28/18 1732  Location: Foot (ball of right foot)  Location Orientation: Right  Staging:   Wound Description (Comments): black thicken area on ball of foot  Present on Admission: Yes     Pressure Injury 12/13/18 Sacrum Left Stage II -  Partial thickness loss of dermis presenting as a shallow open ulcer with a red, pink wound bed without slough. (Active)  12/13/18 0800  Location: Sacrum  Location Orientation: Left  Staging: Stage II -  Partial thickness loss of dermis presenting as a shallow open ulcer with a red, pink wound bed without slough.  Wound Description (Comments):   Present on Admission:      Pressure Injury 12/13/18 Sacrum Right Stage II -  Partial thickness loss of dermis presenting as a shallow open ulcer with a red, pink wound bed without slough. (Active)  12/13/18 0800  Location: Sacrum  Location Orientation: Right  Staging: Stage II -  Partial thickness loss of dermis presenting as a shallow open ulcer with a red, pink wound bed without slough.  Wound Description (Comments):   Present on Admission:          DVT prophylaxis: Eliquis Code Status: Full code Family Communication: None at bedside Disposition Plan: SNF placement when bed is available.  Difficult disposition as patient is homeless and does  not have any insurance with significant comorbidities.   Consultants:   Nephrology and urology  Procedures: (None Antimicrobials:  Anti-infectives (From admission, onward)   Start     Dose/Rate Route Frequency Ordered Stop   12/20/18 1600  piperacillin-tazobactam (ZOSYN) IVPB 3.375 g  Status:  Discontinued     3.375 g 12.5 mL/hr over 240 Minutes Intravenous Every 12 hours 12/20/18 1429 12/27/18 1053   12/19/18 1445   amoxicillin-clavulanate (AUGMENTIN) 500-125 MG per tablet 500 mg  Status:  Discontinued     1 tablet Oral Daily 12/19/18 1432 12/20/18 1424   12/19/18 1430  amoxicillin-clavulanate (AUGMENTIN) 875-125 MG per tablet 1 tablet  Status:  Discontinued     1 tablet Oral Every 12 hours 12/19/18 1427 12/19/18 1431   12/09/18 1200  ceFEPIme (MAXIPIME) 1 g in sodium chloride 0.9 % 100 mL IVPB  Status:  Discontinued     1 g 200 mL/hr over 30 Minutes Intravenous Every 24 hours 12/08/18 1452 12/09/18 0752   12/09/18 0900  cefTRIAXone (ROCEPHIN) 2 g in sodium chloride 0.9 % 100 mL IVPB  Status:  Discontinued     2 g 200 mL/hr over 30 Minutes Intravenous Every 24 hours 12/09/18 0752 12/14/18 1302   12/08/18 1600  azithromycin (ZITHROMAX) 500 mg in sodium chloride 0.9 % 250 mL IVPB  Status:  Discontinued     500 mg 250 mL/hr over 60 Minutes Intravenous Every 24 hours 12/08/18 1508 12/09/18 0752   12/08/18 1452  vancomycin variable dose per unstable renal function (pharmacist dosing)  Status:  Discontinued      Does not apply See admin instructions 12/08/18 1452 12/09/18 0752   12/08/18 1100  vancomycin (VANCOCIN) 2,000 mg in sodium chloride 0.9 % 500 mL IVPB     2,000 mg 250 mL/hr over 120 Minutes Intravenous  Once 12/08/18 1050 12/08/18 1433   12/08/18 1045  ceFEPIme (MAXIPIME) 2 g in sodium chloride 0.9 % 100 mL IVPB     2 g 200 mL/hr over 30 Minutes Intravenous  Once 12/08/18 1031 12/08/18 1241   12/08/18 1045  metroNIDAZOLE (FLAGYL) IVPB 500 mg     500 mg 100 mL/hr over 60 Minutes Intravenous  Once 12/08/18 1031 12/08/18 1326   12/08/18 1045  vancomycin (VANCOCIN) IVPB 1000 mg/200 mL premix  Status:  Discontinued     1,000 mg 200 mL/hr over 60 Minutes Intravenous  Once 12/08/18 1031 12/08/18 1035       Subjective: Patient denies any chest pain, shortness of breath, nausea or vomiting or abdominal pain.  Objective: Vitals:   12/30/18 1331 12/30/18 2013 12/31/18 0538 12/31/18 1308  BP: 105/68  119/82 109/68 108/60  Pulse: (!) 102 100 97 86  Resp: 18 17 17 17   Temp: 98.4 F (36.9 C) 98.5 F (36.9 C) 98.2 F (36.8 C) 98 F (36.7 C)  TempSrc: Oral Oral Oral Oral  SpO2: 100% 97% 98% 96%  Weight:      Height:        Intake/Output Summary (Last 24 hours) at 12/31/2018 1409 Last data filed at 12/31/2018 1318 Gross per 24 hour  Intake 840 ml  Output 3250 ml  Net -2410 ml   Filed Weights   12/08/18 1200 12/08/18 2050 12/09/18 0300  Weight: 86.9 kg 60.8 kg 50 kg    Examination:  General exam: Alert and comfortable, not in any kind of distress Respiratory system: Clear to auscultation, no wheezing or rhonchi Cardiovascular system: S1-S2 heard, regular rate rhythm. Gastrointestinal system: Abdomen is  soft, nontender, nondistended, bowel sounds good Central nervous system: Alert and oriented Extremities: No pedal edema Skin: Stage II sacral pressure injury present on admission Psychiatry: Mood is appropriate    Data Reviewed: I have personally reviewed following labs and imaging studies  CBC: Recent Labs  Lab 12/25/18 0656 12/27/18 0145 12/30/18 0301 12/31/18 1153  WBC 11.0* 9.4 10.2 10.8*  NEUTROABS  --  6.4 5.7 6.1  HGB 7.7* 7.7* 7.6* 8.1*  HCT 24.6* 25.7* 24.9* 26.0*  MCV 84.2 85.1 85.6 84.1  PLT 532* 629* 859* 976*   Basic Metabolic Panel: Recent Labs  Lab 12/27/18 0145 12/30/18 0301 12/31/18 1153  NA 141 135 134*  K 4.8 4.7 4.9  CL 111 108 104  CO2 19* 19* 19*  GLUCOSE 183* 272* 384*  BUN 66* 84* 94*  CREATININE 3.76* 3.99* 4.34*  CALCIUM 8.3* 8.4* 8.8*   GFR: Estimated Creatinine Clearance: 13.3 mL/min (A) (by C-G formula based on SCr of 4.34 mg/dL (H)). Liver Function Tests: No results for input(s): AST, ALT, ALKPHOS, BILITOT, PROT, ALBUMIN in the last 168 hours. No results for input(s): LIPASE, AMYLASE in the last 168 hours. No results for input(s): AMMONIA in the last 168 hours. Coagulation Profile: No results for input(s): INR,  PROTIME in the last 168 hours. Cardiac Enzymes: No results for input(s): CKTOTAL, CKMB, CKMBINDEX, TROPONINI in the last 168 hours. BNP (last 3 results) No results for input(s): PROBNP in the last 8760 hours. HbA1C: No results for input(s): HGBA1C in the last 72 hours. CBG: Recent Labs  Lab 12/30/18 1558 12/30/18 1738 12/30/18 2152 12/31/18 0725 12/31/18 1158  GLUCAP 133* 165* 156* 168* 394*   Lipid Profile: No results for input(s): CHOL, HDL, LDLCALC, TRIG, CHOLHDL, LDLDIRECT in the last 72 hours. Thyroid Function Tests: No results for input(s): TSH, T4TOTAL, FREET4, T3FREE, THYROIDAB in the last 72 hours. Anemia Panel: No results for input(s): VITAMINB12, FOLATE, FERRITIN, TIBC, IRON, RETICCTPCT in the last 72 hours. Sepsis Labs: No results for input(s): PROCALCITON, LATICACIDVEN in the last 168 hours.  No results found for this or any previous visit (from the past 240 hour(s)).       Radiology Studies: No results found.      Scheduled Meds: . apixaban  5 mg Oral BID  . Chlorhexidine Gluconate Cloth  6 each Topical Q0600  . darbepoetin (ARANESP) injection - NON-DIALYSIS  60 mcg Subcutaneous Q Mon-1800  . feeding supplement (PRO-STAT SUGAR FREE 64)  30 mL Oral BID  . insulin aspart  0-15 Units Subcutaneous TID WC  . insulin aspart  0-5 Units Subcutaneous QHS  . insulin aspart  10 Units Subcutaneous TID WC  . insulin detemir  15 Units Subcutaneous Q24H  . mirtazapine  7.5 mg Oral QHS  . nutrition supplement (JUVEN)  1 packet Oral BID BM  . pantoprazole  40 mg Oral QHS   Continuous Infusions: . sodium chloride 10 mL/hr at 12/15/18 1400     LOS: 23 days        Hosie Poisson, MD [facility]  12/31/2018, 2:09 PM

## 2018-12-31 NOTE — Progress Notes (Signed)
CRITICAL VALUE ALERT  Critical Value:  Platelet Count 974  Date & Time Notied:  12/31/18 @ 7542  Provider Notified: Hosie Poisson, MD notified via Amion  Orders Received/Actions taken: Awaiting for further instructions.  Will continue to monitor.

## 2018-12-31 NOTE — Progress Notes (Signed)
Inpatient Diabetes Program Recommendations  AACE/ADA: New Consensus Statement on Inpatient Glycemic Control (2015)  Target Ranges:  Prepandial:   less than 140 mg/dL      Peak postprandial:   less than 180 mg/dL (1-2 hours)      Critically ill patients:  140 - 180 mg/dL   Lab Results  Component Value Date   GLUCAP 394 (H) 12/31/2018   HGBA1C 14.3 (H) 11/10/2018    Review of Glycemic Control Results for THIJS, BRUNTON (MRN [id number]) as of 12/31/2018 14:12  Ref. Range 12/30/2018 17:38 12/30/2018 21:52 12/31/2018 07:25 12/31/2018 11:58  Glucose-Capillary Latest Ref Range: 70 - 99 mg/dL 165 (H) 156 (H) 168 (H) 394 (H)   Consider increasing meal coverage to Novolog 12 units TID (assuming patient is consuming >50% of meal).  Thanks, Bronson Curb, MSN, RNC-OB Diabetes Coordinator [phone] (8a-5p)

## 2019-01-01 LAB — GLUCOSE, CAPILLARY
Glucose-Capillary: 174 mg/dL — ABNORMAL HIGH (ref 70–99)
Glucose-Capillary: 294 mg/dL — ABNORMAL HIGH (ref 70–99)
Glucose-Capillary: 159 mg/dL — ABNORMAL HIGH (ref 70–99)
Glucose-Capillary: 148 mg/dL — ABNORMAL HIGH (ref 70–99)

## 2019-01-01 LAB — PATHOLOGIST SMEAR REVIEW

## 2019-01-01 MED ORDER — INSULIN ASPART 100 UNIT/ML ~~LOC~~ SOLN
14.0000 [IU] | Freq: Three times a day (TID) | SUBCUTANEOUS | Status: DC
  Administered 2019-01-01 – 2019-01-03 (×6): 14 [IU] via SUBCUTANEOUS

## 2019-01-01 NOTE — Progress Notes (Signed)
Physical Therapy Treatment Patient Details Name: Timothy Madden MRN: [id number] DOB: 02/07/1960 Today's Date: 01/01/2019    History of Present Illness This is a 58 year old man with a history of numerous psychiatric issues, diabetes poorly controlled supposed to be on home insulin presenting with obtundation from home found to be in severe DKA/HOCM.  PMH DKA, DM, L second finger amputation.    PT Comments    Pt admitted for above. Pt up in chair upon SPT arrival. He completed transfers and ambulation with min guard for safety and demonstrated improvement in functional mobility by increasing his gait distance to 250 ft. Pt demonstrated good hand placement throughout session and required occasional cues for safety to avoid bumping walker into obstacles. Pt with no LOB or unsteadiness during gait training but with decreased gait speed. Pt would benefit from continued skilled PT intervention in order to address deficits.   Follow Up Recommendations  SNF     Equipment Recommendations  Rolling walker with 5" wheels    Recommendations for Other Services       Precautions / Restrictions Precautions Precautions: Fall Restrictions Weight Bearing Restrictions: No    Mobility  Bed Mobility               General bed mobility comments: pt in chair upon arrival; wanted to return to chair following amb  Transfers Overall transfer level: Needs assistance Equipment used: Rolling walker (2 wheeled) Transfers: Sit to/from Stand Sit to Stand: Min guard         General transfer comment: min guard for safety; pt demonstrates good hand placement during standing and sitting transfers from/to the chair  Ambulation/Gait Ambulation/Gait assistance: Min guard Gait Distance (Feet): 250 Feet Assistive device: Rolling walker (2 wheeled) Gait Pattern/deviations: Decreased stride length;Shuffle Gait velocity: decreased   General Gait Details: slow and steady gait pattern, increased reliance  on RW for balance, min guard for safety, slow gait speed and cadence   Stairs             Wheelchair Mobility    Modified Rankin (Stroke Patients Only)       Balance Overall balance assessment: Needs assistance Sitting-balance support: Feet supported Sitting balance-Leahy Scale: Good Sitting balance - Comments: steady sitting EOB and during LE therex   Standing balance support: Bilateral upper extremity supported;During functional activity;Single extremity supported Standing balance-Leahy Scale: Fair Standing balance comment: reliant on BUE from Rw for ambulation, but stood with one hand on RW for static balance                            Cognition Arousal/Alertness: Awake/alert Behavior During Therapy: WFL for tasks assessed/performed Overall Cognitive Status: Within Functional Limits for tasks assessed                                        Exercises General Exercises - Lower Extremity Long Arc Quad: AROM;Both;10 reps;Seated Hip Flexion/Marching: AROM;10 reps;Seated;Both    General Comments        Pertinent Vitals/Pain Pain Assessment: No/denies pain    Home Living                      Prior Function            PT Goals (current goals can now be found in the care plan section) Acute Rehab PT Goals  Patient Stated Goal: return home PT Goal Formulation: With patient Time For Goal Achievement: 01/07/19 Potential to Achieve Goals: Fair Progress towards PT goals: Progressing toward goals    Frequency    Min 2X/week      PT Plan Current plan remains appropriate    Co-evaluation              AM-PAC PT "6 Clicks" Mobility   Outcome Measure  Help needed turning from your back to your side while in a flat bed without using bedrails?: A Little Help needed moving from lying on your back to sitting on the side of a flat bed without using bedrails?: A Little Help needed moving to and from a bed to a chair  (including a wheelchair)?: A Little Help needed standing up from a chair using your arms (e.g., wheelchair or bedside chair)?: A Little Help needed to walk in hospital room?: A Little Help needed climbing 3-5 steps with a railing? : A Lot 6 Click Score: 17    End of Session Equipment Utilized During Treatment: Gait belt Activity Tolerance: Patient tolerated treatment well Patient left: in chair;with call bell/phone within reach;with chair alarm set Nurse Communication: Mobility status PT Visit Diagnosis: Other abnormalities of gait and mobility (R26.89);Muscle weakness (generalized) (M62.81);Other symptoms and signs involving the nervous system (R29.898)     Time: 0051-1021 PT Time Calculation (min) (ACUTE ONLY): 15 min  Charges:  $Gait Training: 8-22 mins                     Christel Mormon, SPT    [place] Ahmaud Duthie 01/01/2019, 11:56 AM

## 2019-01-01 NOTE — Progress Notes (Signed)
PROGRESS NOTE    Timothy Madden  JSE:[id number] DOB: December 24, 1960 DOA: 12/08/2018 PCP: Marliss Coots, NP   Brief Narrative:  58 year old male prior history of type 2 diabetes, stage III CKD, osteomyelitis, DVT, homelessness presents with DKA.  He was initially admitted to ICU for obtundation and DKA, AKI.  He was transferred to [facility] service after DKA was resolved.  No events overnight.  Patient received IV iron yesterday and tolerated without any issues.  Assessment & Plan:   Active Problems:   Type 2 diabetes mellitus with stage 3 chronic kidney disease (HCC)   Sepsis ([place])   Essential hypertension   Acute renal failure superimposed on chronic kidney disease (HCC)   DKA, type 1, not at goal [facility])   Protein-calorie malnutrition, severe   Acute lower UTI   Bacteremia due to Gram-negative bacteria   Bilateral hydronephrosis   DVT (deep venous thrombosis) (HCC)   Metabolic acidosis   Anemia due to chronic kidney disease   Pressure injury of skin   Hyperkalemia   Hyperglycemia   Pyelonephritis   Severe DKA Resolved. Hemoglobin A1c 14.3 on admission. CBG (last 3)  Recent Labs    12/31/18 1646 12/31/18 2024 01/01/19 0720  GLUCAP 107* 256* 174*   CBGs still uncontrolled with hyperglycemia. Continue with sliding scale insulin , increased NovoLog to 12 units 3 times daily AC. 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  Recheck BMP tomorrow.   Bilateral hydronephrosis with ectopic ureter Continue with Foley catheter  Recommend outpatient follow-up with urology.   Anemia of chronic disease Probably secondary to CKD Transfuse to keep hemoglobin greater than 7.  Currently on Aranesp twice weekly. Iron level still low at 38.   Hemoglobin stable around  8.  Thrombocytosis:  Probably secondary to iron deficiency.  Discussed with Dr. Julien Nordmann (hematology and oncology) recommended 2 doses of IV iron and repeat CBC to check if platelets are improving.  If platelets are not improving recommend to follow-up with hematology for further work-up.  Severe protein calorie malnutrition Patient on Remeron for appetite stimulation   Pressure ulcers on admission Stage II sacral pressure ulcer on admission Pressure Injury 06/28/18 Foot Right black thicken area on ball of foot (Active)  06/28/18 1732  Location: Foot (ball of right foot)  Location Orientation: Right  Staging:   Wound Description (Comments): black thicken area on ball of foot  Present on Admission: Yes     Pressure Injury 12/13/18 Sacrum Left Stage II -  Partial thickness loss of dermis presenting as a shallow open ulcer with a red, pink wound bed without slough. (Active)  12/13/18 0800  Location: Sacrum  Location Orientation: Left  Staging: Stage II -  Partial thickness loss of dermis presenting as a shallow open ulcer with a red, pink wound bed without slough.  Wound Description (Comments):   Present on Admission:      Pressure Injury 12/13/18 Sacrum Right Stage II -  Partial thickness loss of dermis presenting as a shallow open ulcer with a red, pink wound bed without slough. (Active)  12/13/18 0800  Location: Sacrum  Location Orientation: Right  Staging: Stage II -  Partial thickness loss of dermis presenting as a shallow open ulcer with a red, pink wound bed without slough.  Wound Description (Comments):  Present on Admission:          DVT prophylaxis: Eliquis Code Status: Full code Family Communication: None at bedside Disposition Plan: SNF placement when bed is available.  Difficult disposition as patient is homeless and does not have any insurance with significant comorbidities.   Consultants:   Nephrology and urology  Procedures: (None Antimicrobials:   Anti-infectives (From admission, onward)   Start     Dose/Rate Route Frequency Ordered Stop   12/20/18 1600  piperacillin-tazobactam (ZOSYN) IVPB 3.375 g  Status:  Discontinued     3.375 g 12.5 mL/hr over 240 Minutes Intravenous Every 12 hours 12/20/18 1429 12/27/18 1053   12/19/18 1445  amoxicillin-clavulanate (AUGMENTIN) 500-125 MG per tablet 500 mg  Status:  Discontinued     1 tablet Oral Daily 12/19/18 1432 12/20/18 1424   12/19/18 1430  amoxicillin-clavulanate (AUGMENTIN) 875-125 MG per tablet 1 tablet  Status:  Discontinued     1 tablet Oral Every 12 hours 12/19/18 1427 12/19/18 1431   12/09/18 1200  ceFEPIme (MAXIPIME) 1 g in sodium chloride 0.9 % 100 mL IVPB  Status:  Discontinued     1 g 200 mL/hr over 30 Minutes Intravenous Every 24 hours 12/08/18 1452 12/09/18 0752   12/09/18 0900  cefTRIAXone (ROCEPHIN) 2 g in sodium chloride 0.9 % 100 mL IVPB  Status:  Discontinued     2 g 200 mL/hr over 30 Minutes Intravenous Every 24 hours 12/09/18 0752 12/14/18 1302   12/08/18 1600  azithromycin (ZITHROMAX) 500 mg in sodium chloride 0.9 % 250 mL IVPB  Status:  Discontinued     500 mg 250 mL/hr over 60 Minutes Intravenous Every 24 hours 12/08/18 1508 12/09/18 0752   12/08/18 1452  vancomycin variable dose per unstable renal function (pharmacist dosing)  Status:  Discontinued      Does not apply See admin instructions 12/08/18 1452 12/09/18 0752   12/08/18 1100  vancomycin (VANCOCIN) 2,000 mg in sodium chloride 0.9 % 500 mL IVPB     2,000 mg 250 mL/hr over 120 Minutes Intravenous  Once 12/08/18 1050 12/08/18 1433   12/08/18 1045  ceFEPIme (MAXIPIME) 2 g in sodium chloride 0.9 % 100 mL IVPB     2 g 200 mL/hr over 30 Minutes Intravenous  Once 12/08/18 1031 12/08/18 1241   12/08/18 1045  metroNIDAZOLE (FLAGYL) IVPB 500 mg     500 mg 100 mL/hr over 60 Minutes Intravenous  Once 12/08/18 1031 12/08/18 1326   12/08/18 1045  vancomycin (VANCOCIN) IVPB 1000 mg/200 mL premix  Status:  Discontinued      1,000 mg 200 mL/hr over 60 Minutes Intravenous  Once 12/08/18 1031 12/08/18 1035       Subjective: Patient denies any complaints at this time and laying in the chair and appears comfortable. Objective: Vitals:   12/31/18 0538 12/31/18 1308 12/31/18 2112 01/01/19 0444  BP: 109/68 108/60 105/78 114/68  Pulse: 97 86 (!) 103 (!) 105  Resp: 17 17 17 14   Temp: 98.2 F (36.8 C) 98 F (36.7 C) 99.3 F (37.4 C) 99 F (37.2 C)  TempSrc: Oral Oral Oral Oral  SpO2: 98% 96% 100% 100%  Weight:      Height:        Intake/Output Summary (Last 24 hours) at 01/01/2019 1511 Last data filed at 01/01/2019 1300 Gross per 24 hour  Intake 1360 ml  Output 2100 ml  Net -740 ml   Filed Weights   12/08/18 1200 12/08/18 2050 12/09/18 0300  Weight:  86.9 kg 60.8 kg 50 kg    Examination:  General exam: Alert and comfortable, not in any kind of distress. Respiratory system: Air entry fair bilateral, no wheezing or rhonchi Cardiovascular system: S1-S2 heard, regular rate rhythm Gastrointestinal system: Abdomen is soft nontender, nondistended, bowel sounds normal Central nervous system patient is alert and answering questions appropriately. Extremities: No pedal edema Skin: Stage II sacral pressure injury present on admission Psychiatry: Mood is appropriate    Data Reviewed: I have personally reviewed following labs and imaging studies  CBC: Recent Labs  Lab 12/27/18 0145 12/30/18 0301 12/31/18 1153  WBC 9.4 10.2 10.8*  NEUTROABS 6.4 5.7 6.1  HGB 7.7* 7.6* 8.1*  HCT 25.7* 24.9* 26.0*  MCV 85.1 85.6 84.1  PLT 629* 859* 809*   Basic Metabolic Panel: Recent Labs  Lab 12/27/18 0145 12/30/18 0301 12/31/18 1153  NA 141 135 134*  K 4.8 4.7 4.9  CL 111 108 104  CO2 19* 19* 19*  GLUCOSE 183* 272* 384*  BUN 66* 84* 94*  CREATININE 3.76* 3.99* 4.34*  CALCIUM 8.3* 8.4* 8.8*   GFR: Estimated Creatinine Clearance: 13.3 mL/min (A) (by C-G formula based on SCr of 4.34 mg/dL  (H)). Liver Function Tests: No results for input(s): AST, ALT, ALKPHOS, BILITOT, PROT, ALBUMIN in the last 168 hours. No results for input(s): LIPASE, AMYLASE in the last 168 hours. No results for input(s): AMMONIA in the last 168 hours. Coagulation Profile: No results for input(s): INR, PROTIME in the last 168 hours. Cardiac Enzymes: No results for input(s): CKTOTAL, CKMB, CKMBINDEX, TROPONINI in the last 168 hours. BNP (last 3 results) No results for input(s): PROBNP in the last 8760 hours. HbA1C: No results for input(s): HGBA1C in the last 72 hours. CBG: Recent Labs  Lab 12/31/18 0725 12/31/18 1158 12/31/18 1646 12/31/18 2024 01/01/19 0720  GLUCAP 168* 394* 107* 256* 174*   Lipid Profile: No results for input(s): CHOL, HDL, LDLCALC, TRIG, CHOLHDL, LDLDIRECT in the last 72 hours. Thyroid Function Tests: No results for input(s): TSH, T4TOTAL, FREET4, T3FREE, THYROIDAB in the last 72 hours. Anemia Panel: Recent Labs    12/31/18 1519  TIBC 251  IRON 38*   Sepsis Labs: No results for input(s): PROCALCITON, LATICACIDVEN in the last 168 hours.  No results found for this or any previous visit (from the past 240 hour(s)).       Radiology Studies: No results found.      Scheduled Meds: . apixaban  5 mg Oral BID  . Chlorhexidine Gluconate Cloth  6 each Topical Q0600  . darbepoetin (ARANESP) injection - NON-DIALYSIS  60 mcg Subcutaneous Q Mon-1800  . feeding supplement (PRO-STAT SUGAR FREE 64)  30 mL Oral BID  . insulin aspart  0-15 Units Subcutaneous TID WC  . insulin aspart  0-5 Units Subcutaneous QHS  . insulin aspart  14 Units Subcutaneous TID WC  . insulin detemir  15 Units Subcutaneous Q24H  . mirtazapine  7.5 mg Oral QHS  . nutrition supplement (JUVEN)  1 packet Oral BID BM  . pantoprazole  40 mg Oral QHS   Continuous Infusions: . sodium chloride 10 mL/hr at 12/15/18 1400  . ferumoxytol 510 mg (12/31/18 1703)     LOS: 24 days        Hosie Poisson, MD [facility]  01/01/2019, 3:11 PM

## 2019-01-01 NOTE — Progress Notes (Signed)
Nutrition Follow-up  DOCUMENTATION CODES:   Underweight, Severe malnutrition in context of chronic illness  INTERVENTION:   -Continue double protein portions with meals -Continue 1 packet Juven BID, each packet provides 95 calories, 2.5 grams of protein (collagen), and 9.8 grams of carbohydrate (3 grams sugar); also contains 7 grams of L-arginine and L-glutamine, 300 mg vitamin C, 15 mg vitamin E, 1.2 mcg vitamin B-12, 9.5 mg zinc, 200 mg calcium, and 1.5 g  Calcium Beta-hydroxy-Beta-methylbutyrate to support wound healing -Continue 30 ml Prostat BID, each supplement provides 100 kcals and 15 grams protein -Continue to encourage adequate PO intake   NUTRITION DIAGNOSIS:   Severe Malnutrition related to chronic illness(uncontrolled DM, suspected gastroparesis) as evidenced by severe muscle depletion, severe fat depletion.  Ongoing  GOAL:   Patient will meet greater than or equal to 90% of their needs  Progressing   MONITOR:   PO intake, Supplement acceptance, Diet advancement  REASON FOR ASSESSMENT:   Consult Assessment of nutrition requirement/status  ASSESSMENT:   58 yo male admitted with AMS r/t severe DKA/HHS. PMH includes poorly controlled DM, severe PCM, gastroparesis, numerous psychiatric issues.  12/2- advanced to dysphagia 1 diet with thin liquids 12/4- advanced to dysphagia 2 diet with thin liquids  12/10- advanced to dysphagia 3 diet with carb mod restriction and  thin liquids  Reviewed I/O's: -1.3 L x 24 hours and -35.3 L since 12/18/18  UOP: 2.1 L x 24 hours  Spoke with pt, who was sitting in recliner chair at time of visit. Pt was cooperative, but mostly answered close-ended questions. He reports good appetite and consuming most of his food off of his meal trays (noted documented meal completion 40-100%). He shares he ate all of his sausage and pancakes for breakfast this morning. Pt reports taking Juven and Prostat supplements. RD discussed importance of  good meal and and supplement intake to promote healing. Pt amenable to continue prescribed supplements.  Case discussed with RN, who reports that pt is not the best historian, but confirmed that what pt reported to this RD was accurate.   Per TOC notes, pt with pending medicaid application with plan to discharge to SNF on LOG.   Labs reviewed: CBGS: [id number] (inpatient orders for glycemic control are 0-15 units insulin aspart TOD with meals, 0-5 units insulin aspart q HS, 10 units insulin aspart TID with meals, and 15 units insulin determir daily).   Diet Order:   Diet Order            DIET DYS 3 Room service appropriate? Yes; Fluid consistency: Thin  Diet effective now              EDUCATION NEEDS:   Education needs have been addressed  Skin:  Skin Assessment: Skin Integrity Issues: Skin Integrity Issues:: Diabetic Ulcer, Stage II Stage II: lt and rt sacrum Diabetic Ulcer: lt great toe  Last BM:  01/01/19  Height:   Ht Readings from Last 1 Encounters:  12/08/18 6\' 3"  (1.905 m)    Weight:   Wt Readings from Last 1 Encounters:  12/09/18 50 kg    Ideal Body Weight:  89.1 kg  BMI:  Body mass index is 13.78 kg/m.  Estimated Nutritional Needs:   Kcal:  2000-2200  Protein:  110-125 grams  Fluid:  > 2 L    Marveline Profeta A. Jimmye Norman, RD, LDN, [place] Registered Dietitian II Certified Diabetes Care and Education Specialist Pager: [phone] After hours Pager: [phone]

## 2019-01-02 ENCOUNTER — Encounter (HOSPITAL_COMMUNITY): Payer: Self-pay | Admitting: Internal Medicine

## 2019-01-02 DIAGNOSIS — E1122 Type 2 diabetes mellitus with diabetic chronic kidney disease: Secondary | ICD-10-CM

## 2019-01-02 DIAGNOSIS — N189 Chronic kidney disease, unspecified: Secondary | ICD-10-CM

## 2019-01-02 DIAGNOSIS — D631 Anemia in chronic kidney disease: Secondary | ICD-10-CM

## 2019-01-02 DIAGNOSIS — F17210 Nicotine dependence, cigarettes, uncomplicated: Secondary | ICD-10-CM

## 2019-01-02 DIAGNOSIS — D473 Essential (hemorrhagic) thrombocythemia: Secondary | ICD-10-CM

## 2019-01-02 LAB — CBC WITH DIFFERENTIAL/PLATELET
Abs Immature Granulocytes: 1.64 10*3/uL — ABNORMAL HIGH (ref 0.00–0.07)
Basophils Absolute: 0.2 10*3/uL — ABNORMAL HIGH (ref 0.0–0.1)
Basophils Relative: 1 %
Eosinophils Absolute: 0.2 10*3/uL (ref 0.0–0.5)
Eosinophils Relative: 1 %
HCT: 28.4 % — ABNORMAL LOW (ref 39.0–52.0)
Hemoglobin: 8.7 g/dL — ABNORMAL LOW (ref 13.0–17.0)
Immature Granulocytes: 12 %
Lymphocytes Relative: 23 %
Lymphs Abs: 3.2 10*3/uL (ref 0.7–4.0)
MCH: 26 pg (ref 26.0–34.0)
MCHC: 30.6 g/dL (ref 30.0–36.0)
MCV: 85 fL (ref 80.0–100.0)
Monocytes Absolute: 1.7 10*3/uL — ABNORMAL HIGH (ref 0.1–1.0)
Monocytes Relative: 12 %
Neutro Abs: 7.1 10*3/uL (ref 1.7–7.7)
Neutrophils Relative %: 51 %
Platelets: 1027 10*3/uL (ref 150–400)
RBC: 3.34 MIL/uL — ABNORMAL LOW (ref 4.22–5.81)
RDW: 18.9 % — ABNORMAL HIGH (ref 11.5–15.5)
WBC: 14 10*3/uL — ABNORMAL HIGH (ref 4.0–10.5)
nRBC: 0.9 % — ABNORMAL HIGH (ref 0.0–0.2)

## 2019-01-02 LAB — URINALYSIS, ROUTINE W REFLEX MICROSCOPIC
Bilirubin Urine: NEGATIVE
Glucose, UA: NEGATIVE mg/dL
Ketones, ur: NEGATIVE mg/dL
Nitrite: NEGATIVE
Protein, ur: 30 mg/dL — AB
Specific Gravity, Urine: 1.009 (ref 1.005–1.030)
WBC, UA: >50 WBC/hpf — ABNORMAL HIGH (ref 0–5)
pH: 6 (ref 5.0–8.0)

## 2019-01-02 LAB — BASIC METABOLIC PANEL
Anion gap: 12 (ref 5–15)
BUN: 123 mg/dL — ABNORMAL HIGH (ref 6–20)
CO2: 20 mmol/L — ABNORMAL LOW (ref 22–32)
Calcium: 9.1 mg/dL (ref 8.9–10.3)
Chloride: 102 mmol/L (ref 98–111)
Creatinine, Ser: 4.44 mg/dL — ABNORMAL HIGH (ref 0.61–1.24)
GFR calc Af Amer: 16 mL/min — ABNORMAL LOW (ref >60)
GFR calc non Af Amer: 14 mL/min — ABNORMAL LOW (ref >60)
Glucose, Bld: 210 mg/dL — ABNORMAL HIGH (ref 70–99)
Potassium: 4.8 mmol/L (ref 3.5–5.1)
Sodium: 134 mmol/L — ABNORMAL LOW (ref 135–145)

## 2019-01-02 LAB — GLUCOSE, CAPILLARY
Glucose-Capillary: 383 mg/dL — ABNORMAL HIGH (ref 70–99)
Glucose-Capillary: 223 mg/dL — ABNORMAL HIGH (ref 70–99)
Glucose-Capillary: 187 mg/dL — ABNORMAL HIGH (ref 70–99)
Glucose-Capillary: 201 mg/dL — ABNORMAL HIGH (ref 70–99)

## 2019-01-02 NOTE — Progress Notes (Signed)
PROGRESS NOTE    Timothy Madden  YFV:[id number] DOB: March 05, 1960 DOA: 12/08/2018 PCP: Marliss Coots, NP   Brief Narrative:  58 year old male prior history of type 2 diabetes, stage III CKD, osteomyelitis, DVT, homelessness presents with DKA.  He was initially admitted to ICU for obtundation and DKA, AKI.  He was transferred to [facility] service after DKA was resolved.   Patient received IV iron on 12/21 and tolerated without any issues.  No events overnight Platelet count greater than 1 million, hematology consulted for further recommendations.  Assessment & Plan:   Active Problems:   Type 2 diabetes mellitus with stage 3 chronic kidney disease (HCC)   Sepsis ([place])   Essential hypertension   Acute renal failure superimposed on chronic kidney disease (HCC)   DKA, type 1, not at goal [facility])   Protein-calorie malnutrition, severe   Acute lower UTI   Bacteremia due to Gram-negative bacteria   Bilateral hydronephrosis   DVT (deep venous thrombosis) (HCC)   Metabolic acidosis   Anemia due to chronic kidney disease   Pressure injury of skin   Hyperkalemia   Hyperglycemia   Pyelonephritis   Severe DKA Resolved. Hemoglobin A1c 14.3 on admission. CBG (last 3)  Recent Labs    01/01/19 2158 01/02/19 0756 01/02/19 1158  GLUCAP 148* 383* 223*   CBGs are better controlled today continue with sliding scale insulin and NovoLog 12 units 3 times daily AC.     History of [place]atia bacteremia and UTI on admission Completed the course of antibiotics. Patient is afebrile but his WBC count is slightly elevated does not appear to be toxic.  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   Anemia of chronic disease Probably secondary  to CKD Transfuse to keep hemoglobin greater than 7.  Currently on Aranesp twice weekly. Iron level still low at 38.   Hemoglobin stable around 8.7.  Thrombocytosis:  Probably secondary to iron deficiency/reactive.  Discussed with Dr. Julien Nordmann (hematology and oncology) recommended 2 doses of IV iron and repeat CBC to check if platelets are improving.  Platelet count greater than 1 million today.  Hematology consulted suggested probably reactive from acute illness.  Recommend repeating CBC in 1 to 2 weeks to see if it is improving.    Severe protein calorie malnutrition Patient on Remeron for appetite stimulation   Pressure ulcers on admission Stage II sacral pressure ulcer on admission Pressure Injury 06/28/18 Foot Right black thicken area on ball of foot (Active)  06/28/18 1732  Location: Foot (ball of right foot)  Location Orientation: Right  Staging:   Wound Description (Comments): black thicken area on ball of foot  Present on Admission: Yes     Pressure Injury 12/13/18 Sacrum Left Stage II -  Partial thickness loss of dermis presenting as a shallow open ulcer with a red, pink wound bed without slough. (Active)  12/13/18 0800  Location: Sacrum  Location Orientation: Left  Staging: Stage II -  Partial thickness loss of dermis presenting as a shallow open ulcer with a red, pink wound bed without slough.  Wound Description (Comments):   Present on Admission:      Pressure Injury 12/13/18 Sacrum Right Stage II -  Partial thickness loss of dermis presenting as a shallow open ulcer  with a red, pink wound bed without slough. (Active)  12/13/18 0800  Location: Sacrum  Location Orientation: Right  Staging: Stage II -  Partial thickness loss of dermis presenting as a shallow open ulcer with a red, pink wound bed without slough.  Wound Description (Comments):   Present on Admission:          DVT prophylaxis: Eliquis Code Status: Full code Family Communication: None at  bedside Disposition Plan: SNF placement when bed is available.   Consultants:   Nephrology and urology  Procedures: (None Antimicrobials:  Anti-infectives (From admission, onward)   Start     Dose/Rate Route Frequency Ordered Stop   12/20/18 1600  piperacillin-tazobactam (ZOSYN) IVPB 3.375 g  Status:  Discontinued     3.375 g 12.5 mL/hr over 240 Minutes Intravenous Every 12 hours 12/20/18 1429 12/27/18 1053   12/19/18 1445  amoxicillin-clavulanate (AUGMENTIN) 500-125 MG per tablet 500 mg  Status:  Discontinued     1 tablet Oral Daily 12/19/18 1432 12/20/18 1424   12/19/18 1430  amoxicillin-clavulanate (AUGMENTIN) 875-125 MG per tablet 1 tablet  Status:  Discontinued     1 tablet Oral Every 12 hours 12/19/18 1427 12/19/18 1431   12/09/18 1200  ceFEPIme (MAXIPIME) 1 g in sodium chloride 0.9 % 100 mL IVPB  Status:  Discontinued     1 g 200 mL/hr over 30 Minutes Intravenous Every 24 hours 12/08/18 1452 12/09/18 0752   12/09/18 0900  cefTRIAXone (ROCEPHIN) 2 g in sodium chloride 0.9 % 100 mL IVPB  Status:  Discontinued     2 g 200 mL/hr over 30 Minutes Intravenous Every 24 hours 12/09/18 0752 12/14/18 1302   12/08/18 1600  azithromycin (ZITHROMAX) 500 mg in sodium chloride 0.9 % 250 mL IVPB  Status:  Discontinued     500 mg 250 mL/hr over 60 Minutes Intravenous Every 24 hours 12/08/18 1508 12/09/18 0752   12/08/18 1452  vancomycin variable dose per unstable renal function (pharmacist dosing)  Status:  Discontinued      Does not apply See admin instructions 12/08/18 1452 12/09/18 0752   12/08/18 1100  vancomycin (VANCOCIN) 2,000 mg in sodium chloride 0.9 % 500 mL IVPB     2,000 mg 250 mL/hr over 120 Minutes Intravenous  Once 12/08/18 1050 12/08/18 1433   12/08/18 1045  ceFEPIme (MAXIPIME) 2 g in sodium chloride 0.9 % 100 mL IVPB     2 g 200 mL/hr over 30 Minutes Intravenous  Once 12/08/18 1031 12/08/18 1241   12/08/18 1045  metroNIDAZOLE (FLAGYL) IVPB 500 mg     500 mg 100 mL/hr over  60 Minutes Intravenous  Once 12/08/18 1031 12/08/18 1326   12/08/18 1045  vancomycin (VANCOCIN) IVPB 1000 mg/200 mL premix  Status:  Discontinued     1,000 mg 200 mL/hr over 60 Minutes Intravenous  Once 12/08/18 1031 12/08/18 1035       Subjective: Patient denies any complaints. Objective: Vitals:   01/01/19 2201 01/02/19 0504 01/02/19 1442 01/02/19 1451  BP:  108/76 92/74 106/72  Pulse:  (!) 105  (!) 113  Resp:  15 18 18   Temp: 98.3 F (36.8 C) 98.7 F (37.1 C) 98.1 F (36.7 C)   TempSrc: Oral Oral Oral   SpO2:  100% 100% 99%  Weight:      Height:        Intake/Output Summary (Last 24 hours) at 01/02/2019 1505 Last data filed at 01/02/2019 1308 Gross per 24 hour  Intake 1240 ml  Output  2175 ml  Net -935 ml   Filed Weights   12/08/18 1200 12/08/18 2050 12/09/18 0300  Weight: 86.9 kg 60.8 kg 50 kg    Examination:  General exam: Alert and comfortable Respiratory system: Clear to auscultation bilaterally, no wheezing or rhonchi Cardiovascular system: S1-S2 heard, regular rate rhythm Gastrointestinal system: Abdomen is soft, nontender, nondistended, bowel sounds normal Central nervous system alert and answering questions appropriately Extremities: No pedal edema Skin: Stage II sacral pressure injury present on admission Psychiatry: Mood is appropriate    Data Reviewed: I have personally reviewed following labs and imaging studies  CBC: Recent Labs  Lab 12/27/18 0145 12/30/18 0301 12/31/18 1153 01/02/19 0243  WBC 9.4 10.2 10.8* 14.0*  NEUTROABS 6.4 5.7 6.1 7.1  HGB 7.7* 7.6* 8.1* 8.7*  HCT 25.7* 24.9* 26.0* 28.4*  MCV 85.1 85.6 84.1 85.0  PLT 629* 859* 974* 7,106*   Basic Metabolic Panel: Recent Labs  Lab 12/27/18 0145 12/30/18 0301 12/31/18 1153 01/02/19 0243  NA 141 135 134* 134*  K 4.8 4.7 4.9 4.8  CL 111 108 104 102  CO2 19* 19* 19* 20*  GLUCOSE 183* 272* 384* 210*  BUN 66* 84* 94* 123*  CREATININE 3.76* 3.99* 4.34* 4.44*  CALCIUM 8.3*  8.4* 8.8* 9.1   GFR: Estimated Creatinine Clearance: 13 mL/min (A) (by C-G formula based on SCr of 4.44 mg/dL (H)). Liver Function Tests: No results for input(s): AST, ALT, ALKPHOS, BILITOT, PROT, ALBUMIN in the last 168 hours. No results for input(s): LIPASE, AMYLASE in the last 168 hours. No results for input(s): AMMONIA in the last 168 hours. Coagulation Profile: No results for input(s): INR, PROTIME in the last 168 hours. Cardiac Enzymes: No results for input(s): CKTOTAL, CKMB, CKMBINDEX, TROPONINI in the last 168 hours. BNP (last 3 results) No results for input(s): PROBNP in the last 8760 hours. HbA1C: No results for input(s): HGBA1C in the last 72 hours. CBG: Recent Labs  Lab 01/01/19 1224 01/01/19 1656 01/01/19 2158 01/02/19 0756 01/02/19 1158  GLUCAP 294* 159* 148* 383* 223*   Lipid Profile: No results for input(s): CHOL, HDL, LDLCALC, TRIG, CHOLHDL, LDLDIRECT in the last 72 hours. Thyroid Function Tests: No results for input(s): TSH, T4TOTAL, FREET4, T3FREE, THYROIDAB in the last 72 hours. Anemia Panel: Recent Labs    12/31/18 1519  TIBC 251  IRON 38*   Sepsis Labs: No results for input(s): PROCALCITON, LATICACIDVEN in the last 168 hours.  No results found for this or any previous visit (from the past 240 hour(s)).       Radiology Studies: No results found.      Scheduled Meds: . apixaban  5 mg Oral BID  . Chlorhexidine Gluconate Cloth  6 each Topical Q0600  . darbepoetin (ARANESP) injection - NON-DIALYSIS  60 mcg Subcutaneous Q Mon-1800  . feeding supplement (PRO-STAT SUGAR FREE 64)  30 mL Oral BID  . insulin aspart  0-15 Units Subcutaneous TID WC  . insulin aspart  0-5 Units Subcutaneous QHS  . insulin aspart  14 Units Subcutaneous TID WC  . insulin detemir  15 Units Subcutaneous Q24H  . mirtazapine  7.5 mg Oral QHS  . nutrition supplement (JUVEN)  1 packet Oral BID BM  . pantoprazole  40 mg Oral QHS   Continuous Infusions: . sodium  chloride 10 mL/hr at 12/15/18 1400  . ferumoxytol 510 mg (12/31/18 1703)     LOS: 25 days        Hosie Poisson, MD [facility]  01/02/2019, 3:05  PM

## 2019-01-02 NOTE — TOC Progression Note (Signed)
Transition of Care [facility]) - Progression Note    Patient Details  Name: [name] MRN: [id number] Date of Birth: [birth year]  Transition of Care [facility]) CM/SW [place], [place] Phone Number: 01/02/2019, 12:07 PM  Clinical Narrative:    Pt with pending Medicaid and Disability applications. At this time pt unable to care for self at home, pt brother unable to take pt to his home, pt home conditions not suitable for pt return. CSW f/u with the following pending facilities to see if any could offer LOG placement-   [place] no LOG [facility]- admissions on hold [place]- f/u message placed [facility]- no [place]- f/u message placed [facility]- no LOG [facility]- no LOG [place]- no LOG [facility]- no LOG [place]- no LOG  TOC team continuing to follow.    Expected Discharge Plan: [place] Barriers to Discharge: Continued Medical Work up, No SNF bed, Inadequate or no insurance, SNF Pending Medicaid, SNF Pending payor source - LOG  Expected Discharge Plan and Services Expected Discharge Plan: [place] In-house Referral: Clinical Social Work Discharge Planning Services: CM Consult, [facility], Medication Assistance Post Acute Care Choice: [place] arrangements for the past 2 months: Single Family Home  Readmission Risk Interventions Readmission Risk Prevention Plan 11/14/2018  Transportation Screening Complete  PCP or Specialist Appt within 3-5 Days Not Complete  Not Complete comments Clinics to which patient goes do not make appointments-it is walk in.  [place] or Home Care Consult Complete  Social Work Consult for [place] Planning/Counseling Complete  Palliative Care Screening Not Applicable  Medication Review (RN Care Manager) Referral to Pharmacy  Some recent data might be hidden

## 2019-01-02 NOTE — Consult Note (Addendum)
Timothy Madden  [phone] [phone]    [place]  Referring MD:  Dr. 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  He presented to the emergency room with obtundation.  He was found to be in severe DKA. He was initially admitted to the ICU.  He was transferred to the hospitalist service once DKA was resolved.  CBC from admission was reviewed which showed an elevated WBC at 15.7, hemoglobin 12.3, and elevated platelet count of 573,000.  Review of his platelet counts prior to this admission show that they were mostly normal with the exception of several occasions when they were mildly increased in the 560,000 range. Over the course of his hospitalization, his platelet count has slowly increased.  His thrombocytosis was reviewed with on-call hematology who thought it was reactive and they recommended proceeding with a dose of IV iron.  He received Feraheme 510 mg IV x1 dose on 12/31/2018.  Despite receiving IV iron, his platelet count has continued to slowly rise and is up to over 1 million today.  When seen today, the patient has no complaints.  He denies any history of anorexia or weight loss.  No recent fevers or chills.  He denies headaches and visual changes.  Denies chest discomfort, shortness of breath, abdominal pain, nausea, vomiting.  Denies bleeding and ecchymosis.  Denies pain or swelling in any of his extremities.  The patient is homeless.  He states that he is single and has 4 daughters.  He currently smokes 1 pack of cigarettes per day.  Denies alcohol use.  He denies family history of bleeding or clotting disorders.  Hematology was asked see the patient for  recommendations regarding his thrombocytosis.   Past Medical History:  Diagnosis Date   Amputation of finger, left    Complication of anesthesia    Diabetes mellitus without complication ([place])    DKA (diabetic ketoacidoses) ([place]) 06/2018   PONV (postoperative nausea and vomiting)   :    Past Surgical History:  Procedure Laterality Date   I & D EXTREMITY Left 10/26/2013   Procedure: IRRIGATION AND DEBRIDEMENT EXTREMITY,PARTIAL AMPUTATION LEFT INDEX FINGER;  Surgeon: Dayna Barker, MD;  Location: [facility];  Service: Plastics;  Laterality: Left;   I & D EXTREMITY Left 10/30/2013   Procedure: IRRIGATION AND DEBRIDEMENT AND REVISION AMPUTATION OF LEFT INDEX FINGER;  Surgeon: Dayna Barker, MD;  Location: [facility];  Service: Plastics;  Laterality: Left;  :   CURRENT MEDS: Current Facility-Administered Medications  Medication Dose Route Frequency Provider Last Rate Last Admin   0.9 %  sodium chloride infusion   Intravenous PRN Nita Sells, MD 10 mL/hr at 12/15/18 1400 Rate Verify at 12/15/18 1400   acetaminophen (TYLENOL) tablet 650 mg  650 mg Oral Q6H PRN Nita Sells, MD   650 mg at 01/01/19 0943   apixaban (ELIQUIS) tablet 5 mg  5 mg Oral BID Nita Sells, MD   5 mg at 01/02/19 0934   Chlorhexidine Gluconate Cloth 2 % PADS 6 each  6 each Topical Q0600 Nita Sells, MD   6 each at 01/02/19 0502   Darbepoetin Alfa (ARANESP) injection 60 mcg  60 mcg Subcutaneous Q Mon-1800 Elmarie Shiley, MD   60 mcg  at 12/31/18 1817   dextrose 50 % solution 0-50 mL  0-50 mL Intravenous PRN Nita Sells, MD       feeding supplement (PRO-STAT SUGAR FREE 64) liquid 30 mL  30 mL Oral BID Darliss Cheney, MD   30 mL at 01/01/19 2238   ferumoxytol (FERAHEME) 510 mg in sodium chloride 0.9 % 100 mL IVPB  510 mg Intravenous Q Mon-1800 Hosie Poisson, MD 468 mL/hr at 12/31/18 1703 510 mg at 12/31/18 1703   insulin aspart (novoLOG) injection 0-15 Units  0-15 Units Subcutaneous TID WC Kc,  Maren Beach, MD   5 Units at 01/02/19 1218   insulin aspart (novoLOG) injection 0-5 Units  0-5 Units Subcutaneous QHS Kc, Maren Beach, MD   3 Units at 12/31/18 2228   insulin aspart (novoLOG) injection 14 Units  14 Units Subcutaneous TID WC Hosie Poisson, MD   14 Units at 01/02/19 1219   insulin detemir (LEVEMIR) injection 15 Units  15 Units Subcutaneous Q24H Donnamae Jude, [facility]   15 Units at 01/02/19 1219   mirtazapine (REMERON) tablet 7.5 mg  7.5 mg Oral QHS Kc, Maren Beach, MD   7.5 mg at 01/01/19 2238   nitroGLYCERIN (NITROSTAT) SL tablet 0.4 mg  0.4 mg Sublingual Q5 min PRN Nita Sells, MD   0.4 mg at 12/10/18 4696   nutrition supplement (JUVEN) (JUVEN) powder packet 1 packet  1 packet Oral BID BM Nita Sells, MD   1 packet at 01/02/19 0935   pantoprazole (PROTONIX) EC tablet 40 mg  40 mg Oral QHS Nita Sells, MD   40 mg at 01/01/19 2240      No Known Allergies:  Family History  Problem Relation Age of Onset   Hypertension Mother    Diabetes Father    Diabetes Sister    Diabetes Brother    Heart disease Brother    :  Social History   Socioeconomic History   Marital status: Legally Separated    Spouse name: Not on file   Number of children: Not on file   Years of education: Not on file   Highest education level: Not on file  Occupational History   Not on file  Tobacco Use   Smoking status: Current Every Day Smoker    Packs/day: 1.00    Years: 20.00    Pack years: 20.00    Types: Cigarettes   Smokeless tobacco: Never Used  Substance and Sexual Activity   Alcohol use: No    Alcohol/week: 0.0 standard drinks   Drug use: No   Sexual activity: Not on file  Other Topics Concern   Not on file  Social History Narrative   Not on file   Social Determinants of Health   Financial Resource Strain:    Difficulty of Paying Living Expenses: Not on file  Food Insecurity:    Worried About [place] in the Last Year: Not on file   [employer] of Food in the  Last Year: Not on file  Transportation Needs:    Lack of Transportation (Medical): Not on file   Lack of Transportation (Non-Medical): Not on file  Physical Activity:    Days of Exercise per Week: Not on file   Minutes of Exercise per Session: Not on file  Stress:    Feeling of Stress : Not on file  Social Connections:    Frequency of Communication with Friends and Family: Not on file   Frequency of Social Gatherings with Friends and Family: Not on file  Attends Religious Services: Not on file   Active Member of Clubs or Organizations: Not on file   Attends Archivist Meetings: Not on file   Marital Status: Not on file  Intimate Partner Violence:    Fear of Current or Ex-Partner: Not on file   Emotionally Abused: Not on file   Physically Abused: Not on file   Sexually Abused: Not on file  :  REVIEW OF SYSTEMS: A comprehensive 14 point review of systems was negative except as noted in the HPI.  Exam: Patient Vitals for the past 24 hrs:  BP Temp Temp src Pulse Resp SpO2  01/02/19 0504 108/76 98.7 F (37.1 C) Oral (!) 105 15 100 %  01/01/19 2201 110/74 98.3 F (36.8 C) Oral (!) 107 16 100 %  01/01/19 1549 102/60 98.7 F (37.1 C) Oral (!) 110 18 100 %    General: Awake and alert, no distress.   Eyes: PERRL, no scleral icterus.   ENT: Poor dentition, there were no oropharyngeal lesions.   Neck was without thyromegaly.   Lymphatics:  Negative cervical, supraclavicular or axillary adenopathy.  Respiratory: lungs were clear bilaterally without wheezing or crackles.  Cardiovascular:  Regular rate and rhythm, S1/S2, without murmur, rub or gallop.  There was no pedal edema.   GI:  abdomen was soft, flat, nontender, nondistended, without organomegaly.   Musculoskeletal: Moves all extremities x4, status post left index finger amputation Skin exam was without ecchymosis, petechiae.   Neuro exam was nonfocal. Patient was alert and oriented.  Attention was good.   Language was  appropriate.  Mood was normal without depression.  Speech was not pressured.  Thought content was not tangential.    LABS:  Lab Results  Component Value Date   WBC 14.0 (H) 01/02/2019   HGB 8.7 (L) 01/02/2019   HCT 28.4 (L) 01/02/2019   PLT 1,027 (HH) 01/02/2019   GLUCOSE 210 (H) 01/02/2019   CHOL 180 10/26/2013   TRIG 148 10/26/2013   HDL 38 (L) 10/26/2013   LDLCALC 112 (H) 10/26/2013   ALT 14 12/08/2018   AST 23 12/08/2018   NA 134 (L) 01/02/2019   K 4.8 01/02/2019   CL 102 01/02/2019   CREATININE 4.44 (H) 01/02/2019   BUN 123 (H) 01/02/2019   CO2 20 (L) 01/02/2019   INR 1.5 (H) 12/08/2018   HGBA1C 14.3 (H) 11/10/2018   MICROALBUR 0.72 01/19/2006    DG Chest 2 View  Result Date: 12/18/2018 CLINICAL DATA:  Pneumonia. EXAM: CHEST - 2 VIEW COMPARISON:  November 30, 2018 FINDINGS: Bibasilar infiltrates are identified, primarily in the retrocardiac regions. The heart, hila, mediastinum, lungs, and pleura are otherwise unremarkable. IMPRESSION: Bilateral lower lobe infiltrates consistent with pneumonia given history. Recommend short-term follow-up imaging to ensure resolution. Electronically Signed   By: Dorise Bullion III M.D   On: 12/18/2018 11:01   US Renal  Result Date: 12/15/2018 CLINICAL DATA:  Hydronephrosis EXAM: RENAL / URINARY TRACT ULTRASOUND COMPLETE COMPARISON:  12/13/2018 and CT abdomen and pelvis 11/10/2018 FINDINGS: Right Kidney: Renal measurements: 12.2 x 6.4 x 7.4 cm = volume: 300 mL. Marked renal cortical scarring and lobular renal contour similar to recent CT evaluation with persistent dilation of collecting systems in the right kidney though perhaps slightly improved compared to the most recent comparison. Left Kidney: Renal measurements: 12.4 x 7.6 x 7.1 cm = volume: 350 mL. Also with lobular renal contours, no discrete mass and with signs of persistent collecting system distension, also perhaps slightly  improved compared to the previous exam. Bladder: Foley  catheter in the urinary bladder which is collapsed about the catheter. Diffuse bladder wall thickening. Other: None. IMPRESSION: 1. Similar to slightly improved appearance of hydronephrosis bilaterally. Electronically Signed   By: Zetta Bills M.D.   On: 12/15/2018 11:31   US RENAL  Result Date: 12/13/2018 CLINICAL DATA:  Hydronephrosis EXAM: RENAL / URINARY TRACT ULTRASOUND COMPLETE COMPARISON:  Renal ultrasound 12/10/2018 FINDINGS: Right Kidney: Renal measurements: 10.8 x 5.9 x 6.4 cm = volume: 214 mL. Mild to moderate right hydronephrosis, increased since prior study. Mildly increased echotexture. Left Kidney: Renal measurements: 12.1 x 5.7 x 4.7 cm = volume: 172 mL. Moderate left hydronephrosis, similar to prior study. Mildly increased echotexture. Bladder: Bladder is decompressed with Foley catheter in place. Other: None. IMPRESSION: Bilateral hydronephrosis, stable on the left, slightly worsened on the right since prior study. Electronically Signed   By: Rolm Baptise M.D.   On: 12/13/2018 19:10   US Renal  Result Date: 12/10/2018 CLINICAL DATA:  Acute kidney injury EXAM: RENAL / URINARY TRACT ULTRASOUND COMPLETE COMPARISON:  July 19, 2018 FINDINGS: Right Kidney: Renal measurements: 12.8 x 5.8 x 6 cm = volume: 233 mL . Echogenicity within normal limits. No mass visualized. Mild hydronephrosis. Left Kidney: Renal measurements: 13.5 x 5.9 x 6 cm. = volume: 253 mL. Echogenicity within normal limits. No mass visualized. Moderate hydronephrosis. Bladder: Bladder wall thickening seen again. Foley catheter is present. Echogenic foci along nondependent bladder wall likely reflect air. Other: None. IMPRESSION: Persistent left greater than right hydronephrosis and bladder wall thickening. Electronically Signed   By: Macy Mis M.D.   On: 12/10/2018 09:54   DG Chest Port 1 View  Result Date: 12/08/2018 CLINICAL DATA:  [facility] reports that the patient has been altered for an unknown amount of time. Pt does  not answer questions but reacts to pain. Pt hx diabetes. Pt is a smoker EXAM: PORTABLE CHEST - 1 VIEW COMPARISON:  11/21/2007 FINDINGS: Lungs are clear. Heart size and mediastinal contours are within normal limits. No effusion. Visualized bones unremarkable. IMPRESSION: No acute cardiopulmonary disease. Electronically Signed   By: Lucrezia Europe M.D.   On: 12/08/2018 12:11   ASSESSMENT AND PLAN:  This is a 58 year old male with:  1.  Thrombocytosis: Likely reactive.  His prior platelet counts have been mostly normal and were only slightly elevated on admission.  Would recommend rechecking a CBC again in approximately 1 week.  If his platelet count continues to rise, we can arrange for outpatient follow-up at the cancer center.  2.  Anemia due to chronic disease and CKD: He is currently receiving Aranesp twice a week and transfusions to keep hemoglobin greater than 7.  Status post Feraheme 510 mg IV on 12/31/2018.  Continue Aranesp and transfusions as needed.  3.  Diabetes mellitus with history of severe DKA: Continue ongoing management of his diabetes.  He will need ongoing monitoring upon discharge.  Plan is to discharge to SNF when bed available.  Thank you for this referral.  Mikey Bussing, DNP, AGPCNP-BC, AOCNP   Oncology addendum:  Patient seen and examined personally.  Please see note above.  58 year old man hospitalized with DKA and found to have an elevated platelet count during his hospitalization.  His count prior to his hospitalization was close to normal range.  His white cell count was mildly elevated as well.  On physical examination, he is awake alert without any distress.  He has no splenomegaly or lymphadenopathy.  There is no petechia or ecchymosis.   Assessment and recommendation: His thrombocytosis appears to be reactive likely related to acute illness hospitalization.  He might have an element of iron deficiency as well.  At this time I do not recommend any acute  intervention anxiety anticipate improvement in his platelet count in the next 7 to 14 days.  I recommend repeating his CBC in 1 to 2 weeks and if his count continues to increase, hematology follow-up as an outpatient may be reasonable.   Zola Button MD 01/02/2019

## 2019-01-02 NOTE — Significant Event (Signed)
Rapid Response Event Note  Overview: MEWS - Yellow 2 - HR 110s   I was with another patient and received a call from the nurse. I called her back at 1540, per nurse, she just wanted report a Yellow MEWS for tachycardia. Per nurse, patient is asymptomatic, MD was notified, and orders were received for labs. I asked the nurse, if she needed me to see the patient and she said she did not.   NO RRT INTERVENTIONS   Timothy Madden, [place]

## 2019-01-03 LAB — BASIC METABOLIC PANEL
Anion gap: 10 (ref 5–15)
BUN: 130 mg/dL — ABNORMAL HIGH (ref 6–20)
CO2: 18 mmol/L — ABNORMAL LOW (ref 22–32)
Calcium: 9 mg/dL (ref 8.9–10.3)
Chloride: 106 mmol/L (ref 98–111)
Creatinine, Ser: 4.56 mg/dL — ABNORMAL HIGH (ref 0.61–1.24)
GFR calc Af Amer: 15 mL/min — ABNORMAL LOW (ref >60)
GFR calc non Af Amer: 13 mL/min — ABNORMAL LOW (ref >60)
Glucose, Bld: 345 mg/dL — ABNORMAL HIGH (ref 70–99)
Potassium: 4.6 mmol/L (ref 3.5–5.1)
Sodium: 134 mmol/L — ABNORMAL LOW (ref 135–145)

## 2019-01-03 LAB — CBC
HCT: 31.2 % — ABNORMAL LOW (ref 39.0–52.0)
Hemoglobin: 9.4 g/dL — ABNORMAL LOW (ref 13.0–17.0)
MCH: 26.3 pg (ref 26.0–34.0)
MCHC: 30.1 g/dL (ref 30.0–36.0)
MCV: 87.4 fL (ref 80.0–100.0)
Platelets: 995 10*3/uL (ref 150–400)
RBC: 3.57 MIL/uL — ABNORMAL LOW (ref 4.22–5.81)
RDW: 20 % — ABNORMAL HIGH (ref 11.5–15.5)
WBC: 15 10*3/uL — ABNORMAL HIGH (ref 4.0–10.5)
nRBC: 1.7 % — ABNORMAL HIGH (ref 0.0–0.2)

## 2019-01-03 LAB — GLUCOSE, CAPILLARY
Glucose-Capillary: 269 mg/dL — ABNORMAL HIGH (ref 70–99)
Glucose-Capillary: 286 mg/dL — ABNORMAL HIGH (ref 70–99)
Glucose-Capillary: 165 mg/dL — ABNORMAL HIGH (ref 70–99)
Glucose-Capillary: 88 mg/dL (ref 70–99)

## 2019-01-03 MED ORDER — SODIUM CHLORIDE 0.9 % IV SOLN
1.0000 g | INTRAVENOUS | Status: DC
  Administered 2019-01-04 – 2019-01-08 (×5): 1 g via INTRAVENOUS
  Filled 2019-01-03 (×2): qty 1
  Filled 2019-01-03: qty 10
  Filled 2019-01-03 (×2): qty 1
  Filled 2019-01-03: qty 10

## 2019-01-03 MED ORDER — INSULIN DETEMIR 100 UNIT/ML ~~LOC~~ SOLN
20.0000 [IU] | SUBCUTANEOUS | Status: DC
  Administered 2019-01-04 – 2019-01-14 (×11): 20 [IU] via SUBCUTANEOUS
  Filled 2019-01-03 (×12): qty 0.2

## 2019-01-03 MED ORDER — INSULIN ASPART 100 UNIT/ML ~~LOC~~ SOLN
15.0000 [IU] | Freq: Three times a day (TID) | SUBCUTANEOUS | Status: DC
  Administered 2019-01-03 – 2019-01-18 (×38): 15 [IU] via SUBCUTANEOUS

## 2019-01-03 NOTE — Progress Notes (Signed)
PROGRESS NOTE    Timothy Madden  BMW:[id number] DOB: [birth year] DOA: 12/08/2018 PCP: Marliss Coots, NP   Brief Narrative:  58 year old male prior history of type 2 diabetes, stage III CKD, osteomyelitis, DVT, homelessness presents with DKA.  He was initially admitted to ICU for obtundation and DKA, AKI.  He was transferred to [facility] service after DKA was resolved.   Patient received IV iron on 12/21 and tolerated without any issues.  No events overnight Platelet count greater than 57 million, hematology consulted for further recommendations. Overnight, pt had some tachycardia, and UA was abnormal.   Assessment & Plan:   Active Problems:   Type 2 diabetes mellitus with stage 3 chronic kidney disease (HCC)   Sepsis ([place])   Essential hypertension   Acute renal failure superimposed on chronic kidney disease (HCC)   DKA, type 1, not at goal [facility])   Protein-calorie malnutrition, severe   Acute lower UTI   Bacteremia due to Gram-negative bacteria   Bilateral hydronephrosis   DVT (deep venous thrombosis) (HCC)   Metabolic acidosis   Anemia due to chronic kidney disease   Pressure injury of skin   Hyperkalemia   Hyperglycemia   Pyelonephritis   Severe DKA Resolved. Hemoglobin A1c 14.3 on admission. CBG (last 3)  Recent Labs    01/02/19 2154 01/03/19 0730 01/03/19 1200  GLUCAP 201* 269* 286*   Increase his lantus to 20 units and continue with SSI and novolog 15 units TIDAC.  Monitor CBG'S int he next 24 hours.      History of [place] bacteremia and UTI on admission Completed the course of antibiotics. Patient is afebrile but his WBC count is elevated.  Abnormal UA and urine cultures are ordered and he was started on IV rocephin.     Essential hypertension Well controlled.    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 Repeat BMP today.    Bilateral hydronephrosis with ectopic ureter Continue with  Foley catheter  Recommend outpatient follow-up with urology.   Anemia of chronic disease Probably secondary to CKD Transfuse to keep hemoglobin greater than 7.  Currently on Aranesp twice weekly. Iron level still low at 38.   Hemoglobin around 8 today.   Thrombocytosis:  Probably secondary to iron deficiency/reactive.  Discussed with Dr. Julien Nordmann (hematology and oncology) recommended 2 doses of IV iron and repeat CBC to check if platelets are improving.  Platelet count greater than 57 million today.  Hematology consulted suggested probably reactive from acute illness.  Recommend repeating CBC in 1 to 2 weeks to see if it is improving.    Severe protein calorie malnutrition Patient on Remeron for appetite stimulation. Appetite seems good. No complaints.    Pressure ulcers on admission Stage II sacral pressure ulcer on admission Pressure Injury 06/28/18 Foot Right black thicken area on ball of foot (Active)  06/28/18 1732  Location: Foot (ball of right foot)  Location Orientation: Right  Staging:   Wound Description (Comments): black thicken area on ball of foot  Present on Admission: Yes     Pressure Injury 12/13/18 Sacrum Left Stage II -  Partial thickness loss of dermis presenting as a shallow open ulcer with a red, pink wound bed without slough. (Active)  12/13/18 0800  Location: Sacrum  Location Orientation: Left  Staging: Stage II -  Partial thickness loss of dermis presenting as a shallow open ulcer with a red, pink wound bed without slough.  Wound  Description (Comments):   Present on Admission:      Pressure Injury 12/13/18 Sacrum Right Stage II -  Partial thickness loss of dermis presenting as a shallow open ulcer with a red, pink wound bed without slough. (Active)  12/13/18 0800  Location: Sacrum  Location Orientation: Right  Staging: Stage II -  Partial thickness loss of dermis presenting as a shallow open ulcer with a red, pink wound bed without slough.  Wound  Description (Comments):   Present on Admission:          DVT prophylaxis: Eliquis Code Status: Full code Family Communication: None at bedside Disposition Plan: SNF placement when bed is available.   Consultants:   Nephrology and urology  Procedures: (None Antimicrobials:  Anti-infectives (From admission, onward)   Start     Dose/Rate Route Frequency Ordered Stop   01/04/19 0000  cefTRIAXone (ROCEPHIN) 1 g in sodium chloride 0.9 % 100 mL IVPB     1 g 200 mL/hr over 30 Minutes Intravenous Every 24 hours 01/03/19 1213     12/20/18 1600  piperacillin-tazobactam (ZOSYN) IVPB 3.375 g  Status:  Discontinued     3.375 g 12.5 mL/hr over 240 Minutes Intravenous Every 12 hours 12/20/18 1429 12/27/18 1053   12/19/18 1445  amoxicillin-clavulanate (AUGMENTIN) 500-125 MG per tablet 500 mg  Status:  Discontinued     1 tablet Oral Daily 12/19/18 1432 12/20/18 1424   12/19/18 1430  amoxicillin-clavulanate (AUGMENTIN) 875-125 MG per tablet 1 tablet  Status:  Discontinued     1 tablet Oral Every 12 hours 12/19/18 1427 12/19/18 1431   12/09/18 1200  ceFEPIme (MAXIPIME) 1 g in sodium chloride 0.9 % 100 mL IVPB  Status:  Discontinued     1 g 200 mL/hr over 30 Minutes Intravenous Every 24 hours 12/08/18 1452 12/09/18 0752   12/09/18 0900  cefTRIAXone (ROCEPHIN) 2 g in sodium chloride 0.9 % 100 mL IVPB  Status:  Discontinued     2 g 200 mL/hr over 30 Minutes Intravenous Every 24 hours 12/09/18 0752 12/14/18 1302   12/08/18 1600  azithromycin (ZITHROMAX) 500 mg in sodium chloride 0.9 % 250 mL IVPB  Status:  Discontinued     500 mg 250 mL/hr over 60 Minutes Intravenous Every 24 hours 12/08/18 1508 12/09/18 0752   12/08/18 1452  vancomycin variable dose per unstable renal function (pharmacist dosing)  Status:  Discontinued      Does not apply See admin instructions 12/08/18 1452 12/09/18 0752   12/08/18 1100  vancomycin (VANCOCIN) 2,000 mg in sodium chloride 0.9 % 500 mL IVPB     2,000 mg 250  mL/hr over 120 Minutes Intravenous  Once 12/08/18 1050 12/08/18 1433   12/08/18 1045  ceFEPIme (MAXIPIME) 2 g in sodium chloride 0.9 % 100 mL IVPB     2 g 200 mL/hr over 30 Minutes Intravenous  Once 12/08/18 1031 12/08/18 1241   12/08/18 1045  metroNIDAZOLE (FLAGYL) IVPB 500 mg     500 mg 100 mL/hr over 60 Minutes Intravenous  Once 12/08/18 1031 12/08/18 1326   12/08/18 1045  vancomycin (VANCOCIN) IVPB 1000 mg/200 mL premix  Status:  Discontinued     1,000 mg 200 mL/hr over 60 Minutes Intravenous  Once 12/08/18 1031 12/08/18 1035       Subjective: Denies any dysuria, chest pain or sob.  Objective: Vitals:   01/02/19 1727 01/02/19 1855 01/03/19 0021 01/03/19 0416  BP: 123/81 104/73 105/70 114/78  Pulse: (!) 106 (!) 110 (!) 109 Marland Kitchen)  106  Resp: 17 18 16 17   Temp: 98.9 F (37.2 C) 98.7 F (37.1 C) 99.3 F (37.4 C) 98.6 F (37 C)  TempSrc: Oral Oral Oral Oral  SpO2: 100% 100% 100% 100%  Weight:      Height:        Intake/Output Summary (Last 24 hours) at 01/03/2019 1245 Last data filed at 01/03/2019 1018 Gross per 24 hour  Intake 790 ml  Output 2775 ml  Net -1985 ml   Filed Weights   12/08/18 1200 12/08/18 2050 12/09/18 0300  Weight: 86.9 kg 60.8 kg 50 kg    Examination:  General exam: Patient is alert and comfortable Respiratory system: Clear to auscultation bilaterally, no wheezing or rhonchi Cardiovascular system: S1-S2 heard, irregular Gastrointestinal system: Abdomen is soft, nontender, nondistended, bowel sounds normal Central nervous system alert and oriented to place and person Extremities: No pedal edema, cyanosis or clubbing Skin: Stage II sacral pressure injury present on admission Psychiatry: Mood is appropriate    Data Reviewed: I have personally reviewed following labs and imaging studies  CBC: Recent Labs  Lab 12/30/18 0301 12/31/18 1153 01/02/19 0243  WBC 10.2 10.8* 14.0*  NEUTROABS 5.7 6.1 7.1  HGB 7.6* 8.1* 8.7*  HCT 24.9* 26.0* 28.4*    MCV 85.6 84.1 85.0  PLT 859* 974* 9,924*   Basic Metabolic Panel: Recent Labs  Lab 12/30/18 0301 12/31/18 1153 01/02/19 0243  NA 135 134* 134*  K 4.7 4.9 4.8  CL 108 104 102  CO2 19* 19* 20*  GLUCOSE 272* 384* 210*  BUN 84* 94* 123*  CREATININE 3.99* 4.34* 4.44*  CALCIUM 8.4* 8.8* 9.1   GFR: Estimated Creatinine Clearance: 13 mL/min (A) (by C-G formula based on SCr of 4.44 mg/dL (H)). Liver Function Tests: No results for input(s): AST, ALT, ALKPHOS, BILITOT, PROT, ALBUMIN in the last 168 hours. No results for input(s): LIPASE, AMYLASE in the last 168 hours. No results for input(s): AMMONIA in the last 168 hours. Coagulation Profile: No results for input(s): INR, PROTIME in the last 168 hours. Cardiac Enzymes: No results for input(s): CKTOTAL, CKMB, CKMBINDEX, TROPONINI in the last 168 hours. BNP (last 3 results) No results for input(s): PROBNP in the last 8760 hours. HbA1C: No results for input(s): HGBA1C in the last 72 hours. CBG: Recent Labs  Lab 01/02/19 1158 01/02/19 1703 01/02/19 2154 01/03/19 0730 01/03/19 1200  GLUCAP 223* 187* 201* 269* 286*   Lipid Profile: No results for input(s): CHOL, HDL, LDLCALC, TRIG, CHOLHDL, LDLDIRECT in the last 72 hours. Thyroid Function Tests: No results for input(s): TSH, T4TOTAL, FREET4, T3FREE, THYROIDAB in the last 72 hours. Anemia Panel: Recent Labs    12/31/18 1519  TIBC 251  IRON 38*   Sepsis Labs: No results for input(s): PROCALCITON, LATICACIDVEN in the last 168 hours.  No results found for this or any previous visit (from the past 240 hour(s)).       Radiology Studies: No results found.      Scheduled Meds: . apixaban  5 mg Oral BID  . Chlorhexidine Gluconate Cloth  6 each Topical Q0600  . darbepoetin (ARANESP) injection - NON-DIALYSIS  60 mcg Subcutaneous Q Mon-1800  . feeding supplement (PRO-STAT SUGAR FREE 64)  30 mL Oral BID  . insulin aspart  0-15 Units Subcutaneous TID WC  . insulin  aspart  0-5 Units Subcutaneous QHS  . insulin aspart  14 Units Subcutaneous TID WC  . [START ON 01/04/2019] insulin detemir  20 Units Subcutaneous Q24H  .  mirtazapine  7.5 mg Oral QHS  . nutrition supplement (JUVEN)  1 packet Oral BID BM  . pantoprazole  40 mg Oral QHS   Continuous Infusions: . sodium chloride 10 mL/hr at 12/15/18 1400  . [START ON 01/04/2019] cefTRIAXone (ROCEPHIN)  IV    . ferumoxytol 510 mg (12/31/18 1703)     LOS: 26 days        Hosie Poisson, MD [facility]  01/03/2019, 12:45 PM

## 2019-01-03 NOTE — Progress Notes (Signed)
Physical Therapy Treatment Patient Details Name: Timothy Madden MRN: [id number] DOB: [birth year] Today's Date: 01/03/2019    History of Present Illness This is a 58 year old man with a history of numerous psychiatric issues, diabetes poorly controlled supposed to be on home insulin presenting with obtundation from home found to be in severe DKA/HOCM.  PMH DKA, DM, L second finger amputation.    PT Comments    Pt admitted for above. Pt with flat affect throughout. He was able to complete bed mobility with supervision and transfers and gait min guard. Pt had good hand placement throughout session and no LOB or unsteadiness during gait or while standing ~5 mins for pericare. Completed some LE exercises and pt tolerated these well. Continue to recommend SNF due to pt's judgment deficits and unknown discharge plan at this time. Pt would continue to benefit from continued skilled PT intervention in order to address deficits.     Follow Up Recommendations  SNF     Equipment Recommendations  Rolling walker with 5" wheels    Recommendations for Other Services       Precautions / Restrictions Precautions Precautions: Fall Restrictions Weight Bearing Restrictions: No    Mobility  Bed Mobility Overal bed mobility: Needs Assistance Bed Mobility: Supine to Sit     Supine to sit: Supervision     General bed mobility comments: supervision for safety  Transfers Overall transfer level: Needs assistance Equipment used: Rolling walker (2 wheeled) Transfers: Sit to/from Stand Sit to Stand: Min guard         General transfer comment: min guard for safety; pt completed x2 with good hand placement  Ambulation/Gait Ambulation/Gait assistance: Min guard Gait Distance (Feet): 250 Feet Assistive device: Rolling walker (2 wheeled) Gait Pattern/deviations: Decreased stride length;Step-through pattern Gait velocity: decreased   General Gait Details: slow and steady gait pattern, increased  reliance on RW for balance, min guard for safety, slow gait speed and cadence   Stairs             Wheelchair Mobility    Modified Rankin (Stroke Patients Only)       Balance Overall balance assessment: Needs assistance Sitting-balance support: No upper extremity supported;Feet unsupported Sitting balance-Leahy Scale: Good Sitting balance - Comments: steady sitting EOB and during LE therex   Standing balance support: Bilateral upper extremity supported;During functional activity;Single extremity supported Standing balance-Leahy Scale: Fair Standing balance comment: reliant on BUE from Rw for ambulation, but stood with one hand on RW for static balance                            Cognition Arousal/Alertness: Awake/alert Behavior During Therapy: Flat affect Overall Cognitive Status: Within Functional Limits for tasks assessed Area of Impairment: Safety/judgement                         Safety/Judgement: Decreased awareness of deficits     General Comments: flat affect throughout and pt did not realize he had had a BM once in the chair; one word responses      Exercises Total Joint Exercises Ankle Circles/Pumps: AROM;Both;10 reps General Exercises - Lower Extremity Quad Sets: AROM;Both;10 reps;Seated Long Arc Quad: AROM;Both;10 reps;Seated    General Comments        Pertinent Vitals/Pain Pain Assessment: No/denies pain Faces Pain Scale: No hurt Pain Intervention(s): Limited activity within patient's tolerance;Monitored during session;Repositioned    Home Living  Prior Function            PT Goals (current goals can now be found in the care plan section) Acute Rehab PT Goals Patient Stated Goal: return home PT Goal Formulation: With patient Time For Goal Achievement: 01/07/19 Potential to Achieve Goals: Fair Progress towards PT goals: Progressing toward goals    Frequency    Min 2X/week       PT Plan Current plan remains appropriate    Co-evaluation              AM-PAC PT "6 Clicks" Mobility   Outcome Measure  Help needed turning from your back to your side while in a flat bed without using bedrails?: A Little Help needed moving from lying on your back to sitting on the side of a flat bed without using bedrails?: A Little Help needed moving to and from a bed to a chair (including a wheelchair)?: A Little Help needed standing up from a chair using your arms (e.g., wheelchair or bedside chair)?: A Little Help needed to walk in hospital room?: A Little Help needed climbing 3-5 steps with a railing? : A Lot 6 Click Score: 17    End of Session Equipment Utilized During Treatment: Gait belt Activity Tolerance: Patient tolerated treatment well Patient left: in chair;with call bell/phone within reach;with chair alarm set Nurse Communication: Mobility status PT Visit Diagnosis: Other abnormalities of gait and mobility (R26.89);Muscle weakness (generalized) (M62.81);Other symptoms and signs involving the nervous system (R29.898)     Time: 7414-2395 PT Time Calculation (min) (ACUTE ONLY): 26 min  Charges:  $Gait Training: 8-22 mins $Therapeutic Exercise: 8-22 mins                    Christel Mormon, SPT    Driscoll Shakya [place] 01/03/2019, 1:06 PM

## 2019-01-03 NOTE — Progress Notes (Signed)
HR was elevated, activated yellow mews protocol RR RN called, MD made aware. Patient was asymtptomatic, not in any distress. Will continue to monotor patient.

## 2019-01-04 ENCOUNTER — Inpatient Hospital Stay (HOSPITAL_COMMUNITY): Payer: Medicaid Other

## 2019-01-04 LAB — GLUCOSE, CAPILLARY
Glucose-Capillary: 265 mg/dL — ABNORMAL HIGH (ref 70–99)
Glucose-Capillary: 133 mg/dL — ABNORMAL HIGH (ref 70–99)
Glucose-Capillary: 246 mg/dL — ABNORMAL HIGH (ref 70–99)
Glucose-Capillary: 118 mg/dL — ABNORMAL HIGH (ref 70–99)

## 2019-01-04 NOTE — Progress Notes (Signed)
PROGRESS NOTE    Timothy Madden  GGE:[id number] DOB: 01-04-1961 DOA: 12/08/2018 PCP: Marliss Coots, NP   Brief Narrative:  58 year old male prior history of type 2 diabetes, stage III CKD, osteomyelitis, DVT, homelessness presents with DKA.  He was initially admitted to ICU for obtundation and DKA, AKI.  He was transferred to [facility] service after DKA was resolved.   Patient received IV iron on 12/21 and tolerated without any issues.  No events overnight Platelet count greater than 1 million, hematology consulted, suggested its probably reactive and recommended to follow-up with CBC in 1 to 2 weeks. Patient has been having some intermittent tachycardia and worsening renal parameters.  Repeat ultrasound of the renal shows stable hydronephrosis and chronic cystitis.  Assessment & Plan:   Active Problems:   Type 2 diabetes mellitus with stage 3 chronic kidney disease (HCC)   Sepsis ([place])   Essential hypertension   Acute renal failure superimposed on chronic kidney disease (HCC)   DKA, type 1, not at goal [facility])   Protein-calorie malnutrition, severe   Acute lower UTI   Bacteremia due to Gram-negative bacteria   Bilateral hydronephrosis   DVT (deep venous thrombosis) (HCC)   Metabolic acidosis   Anemia due to chronic kidney disease   Pressure injury of skin   Hyperkalemia   Hyperglycemia   Pyelonephritis   Severe DKA Resolved. Hemoglobin A1c 14.3 on admission. CBG (last 3)  Recent Labs    01/03/19 2117 01/04/19 0754 01/04/19 1157  GLUCAP 88 265* 133*   Increase his lantus to 20 units and continue with SSI and novolog 15 units TIDAC.  Monitor CBG'S int he next 24 hours.  No changes in his Lantus at this time.  Continue to monitor.     History of Serratia bacteremia and UTI on admission Completed 1 course of antibiotics. Patient is afebrile but his WBC count is elevated.  Urine cultures are growing greater than 100,000 gram-negative rods.  Continue with IV  Rocephin.    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  Urine output has been adequate by 2.5 L overnight. Repeat ultrasound of the kidneys shows stable hydronephrosis but with chronic cystitis.  Continue to follow-up.  Bilateral hydronephrosis with ectopic ureter Continue with Foley catheter  Recommend outpatient follow-up with urology.   Anemia of chronic disease Probably secondary to CKD Transfuse to keep hemoglobin greater than 7.  Currently on Aranesp twice weekly. Iron level still low at 38.   Hemoglobin at 9.4 today  Thrombocytosis:  Probably secondary to iron deficiency/reactive.  Discussed with Dr. Julien Nordmann (hematology and oncology) recommended 2 doses of IV iron and repeat CBC to check if platelets are improving.  Platelet count greater than 1 million today.  Hematology consulted suggested probably reactive from acute illness.  Recommend repeating CBC in 1 to 2 weeks to see if it is improving. CBC today shows platelets of 995000.  Patient continues to remain asymptomatic.    Severe protein calorie malnutrition Patient on Remeron for appetite stimulation. Appetite is good, no complaints   Pressure ulcers on admission Stage II sacral pressure ulcer on admission Pressure Injury 06/28/18 Foot Right black thicken area on ball of foot (Active)  06/28/18 1732  Location: Foot (ball of right foot)  Location Orientation: Right  Staging:   Wound Description (Comments): black thicken area on ball of foot  Present on Admission: Yes  Pressure Injury 12/13/18 Sacrum Left Stage II -  Partial thickness loss of dermis presenting as a shallow open ulcer with a red, pink wound bed without slough. (Active)  12/13/18 0800  Location: Sacrum  Location Orientation: Left  Staging: Stage II -  Partial thickness loss of dermis presenting as a shallow open ulcer with a  red, pink wound bed without slough.  Wound Description (Comments):   Present on Admission:      Pressure Injury 12/13/18 Sacrum Right Stage II -  Partial thickness loss of dermis presenting as a shallow open ulcer with a red, pink wound bed without slough. (Active)  12/13/18 0800  Location: Sacrum  Location Orientation: Right  Staging: Stage II -  Partial thickness loss of dermis presenting as a shallow open ulcer with a red, pink wound bed without slough.  Wound Description (Comments):   Present on Admission:          DVT prophylaxis: Eliquis Code Status: Full code Family Communication: None at bedside Disposition Plan: SNF placement when bed is available.  Patient is not safe to discharge home   Consultants:   Nephrology and urology  Procedures: (None Antimicrobials:  Anti-infectives (From admission, onward)   Start     Dose/Rate Route Frequency Ordered Stop   01/04/19 2200  cefTRIAXone (ROCEPHIN) 1 g in sodium chloride 0.9 % 100 mL IVPB     1 g 200 mL/hr over 30 Minutes Intravenous Every 24 hours 01/03/19 1213     12/20/18 1600  piperacillin-tazobactam (ZOSYN) IVPB 3.375 g  Status:  Discontinued     3.375 g 12.5 mL/hr over 240 Minutes Intravenous Every 12 hours 12/20/18 1429 12/27/18 1053   12/19/18 1445  amoxicillin-clavulanate (AUGMENTIN) 500-125 MG per tablet 500 mg  Status:  Discontinued     1 tablet Oral Daily 12/19/18 1432 12/20/18 1424   12/19/18 1430  amoxicillin-clavulanate (AUGMENTIN) 875-125 MG per tablet 1 tablet  Status:  Discontinued     1 tablet Oral Every 12 hours 12/19/18 1427 12/19/18 1431   12/09/18 1200  ceFEPIme (MAXIPIME) 1 g in sodium chloride 0.9 % 100 mL IVPB  Status:  Discontinued     1 g 200 mL/hr over 30 Minutes Intravenous Every 24 hours 12/08/18 1452 12/09/18 0752   12/09/18 0900  cefTRIAXone (ROCEPHIN) 2 g in sodium chloride 0.9 % 100 mL IVPB  Status:  Discontinued     2 g 200 mL/hr over 30 Minutes Intravenous Every 24 hours 12/09/18  0752 12/14/18 1302   12/08/18 1600  azithromycin (ZITHROMAX) 500 mg in sodium chloride 0.9 % 250 mL IVPB  Status:  Discontinued     500 mg 250 mL/hr over 60 Minutes Intravenous Every 24 hours 12/08/18 1508 12/09/18 0752   12/08/18 1452  vancomycin variable dose per unstable renal function (pharmacist dosing)  Status:  Discontinued      Does not apply See admin instructions 12/08/18 1452 12/09/18 0752   12/08/18 1100  vancomycin (VANCOCIN) 2,000 mg in sodium chloride 0.9 % 500 mL IVPB     2,000 mg 250 mL/hr over 120 Minutes Intravenous  Once 12/08/18 1050 12/08/18 1433   12/08/18 1045  ceFEPIme (MAXIPIME) 2 g in sodium chloride 0.9 % 100 mL IVPB     2 g 200 mL/hr over 30 Minutes Intravenous  Once 12/08/18 1031 12/08/18 1241   12/08/18 1045  metroNIDAZOLE (FLAGYL) IVPB 500 mg     500 mg 100 mL/hr over 60 Minutes Intravenous  Once 12/08/18 1031 12/08/18 1326  12/08/18 1045  vancomycin (VANCOCIN) IVPB 1000 mg/200 mL premix  Status:  Discontinued     1,000 mg 200 mL/hr over 60 Minutes Intravenous  Once 12/08/18 1031 12/08/18 1035       Subjective: Patient denies any chest pain, shortness of breath, nausea or vomiting. Objective: Vitals:   01/03/19 0416 01/03/19 1417 01/03/19 2119 01/04/19 0509  BP: 114/78 (!) 109/56 105/76 115/76  Pulse: (!) 106 (!) 115 93 (!) 102  Resp: 17 16 18 16   Temp: 98.6 F (37 C) 98.6 F (37 C) 98.7 F (37.1 C) 98.2 F (36.8 C)  TempSrc: Oral Oral Oral Oral  SpO2: 100% 99% (!) 89% 100%  Weight:      Height:        Intake/Output Summary (Last 24 hours) at 01/04/2019 1320 Last data filed at 01/04/2019 0755 Gross per 24 hour  Intake 600 ml  Output 2252 ml  Net -1652 ml   Filed Weights   12/08/18 1200 12/08/18 2050 12/09/18 0300  Weight: 86.9 kg 60.8 kg 50 kg    Examination:  General exam: Patient is alert and comfortable not in any kind of distress. Respiratory system: Clear to auscultation bilaterally, no wheezing or rhonchi Cardiovascular  system: S1-S2 heard, tachycardic, no JVD Gastrointestinal system: Abdomen is soft, nontender, nondistended, bowel sounds normal Central nervous system alert and oriented to place and person, no focal deficits Extremities: No pedal edema Skin: Stage II sacral pressure injury present on admission Psychiatry: Mood is appropriate    Data Reviewed: I have personally reviewed following labs and imaging studies  CBC: Recent Labs  Lab 12/30/18 0301 12/31/18 1153 01/02/19 0243 01/03/19 1227  WBC 10.2 10.8* 14.0* 15.0*  NEUTROABS 5.7 6.1 7.1  --   HGB 7.6* 8.1* 8.7* 9.4*  HCT 24.9* 26.0* 28.4* 31.2*  MCV 85.6 84.1 85.0 87.4  PLT 859* 974* 1,027* 235*   Basic Metabolic Panel: Recent Labs  Lab 12/30/18 0301 12/31/18 1153 01/02/19 0243 01/03/19 1227  NA 135 134* 134* 134*  K 4.7 4.9 4.8 4.6  CL 108 104 102 106  CO2 19* 19* 20* 18*  GLUCOSE 272* 384* 210* 345*  BUN 84* 94* 123* 130*  CREATININE 3.99* 4.34* 4.44* 4.56*  CALCIUM 8.4* 8.8* 9.1 9.0   GFR: Estimated Creatinine Clearance: 12.6 mL/min (A) (by C-G formula based on SCr of 4.56 mg/dL (H)). Liver Function Tests: No results for input(s): AST, ALT, ALKPHOS, BILITOT, PROT, ALBUMIN in the last 168 hours. No results for input(s): LIPASE, AMYLASE in the last 168 hours. No results for input(s): AMMONIA in the last 168 hours. Coagulation Profile: No results for input(s): INR, PROTIME in the last 168 hours. Cardiac Enzymes: No results for input(s): CKTOTAL, CKMB, CKMBINDEX, TROPONINI in the last 168 hours. BNP (last 3 results) No results for input(s): PROBNP in the last 8760 hours. HbA1C: No results for input(s): HGBA1C in the last 72 hours. CBG: Recent Labs  Lab 01/03/19 1200 01/03/19 1647 01/03/19 2117 01/04/19 0754 01/04/19 1157  GLUCAP 286* 165* 88 265* 133*   Lipid Profile: No results for input(s): CHOL, HDL, LDLCALC, TRIG, CHOLHDL, LDLDIRECT in the last 72 hours. Thyroid Function Tests: No results for  input(s): TSH, T4TOTAL, FREET4, T3FREE, THYROIDAB in the last 72 hours. Anemia Panel: No results for input(s): VITAMINB12, FOLATE, FERRITIN, TIBC, IRON, RETICCTPCT in the last 72 hours. Sepsis Labs: No results for input(s): PROCALCITON, LATICACIDVEN in the last 168 hours.  Recent Results (from the past 240 hour(s))  Culture, Urine  Status: Abnormal (Preliminary result)   Collection Time: 01/03/19  6:03 PM   Specimen: Urine, Catheterized  Result Value Ref Range Status   Specimen Description URINE, CATHETERIZED  Final   Special Requests NONE  Final   Culture (A)  Final    >=100,000 COLONIES/mL GRAM NEGATIVE RODS IDENTIFICATION AND SUSCEPTIBILITIES TO FOLLOW Performed at [facility], [address] [place], [place] [address]    Report Status PENDING  Incomplete         Radiology Studies: US Renal  Result Date: 01/04/2019 CLINICAL DATA:  Follow-up chronic BILATERAL hydronephrosis. EXAM: RENAL / URINARY TRACT ULTRASOUND COMPLETE COMPARISON:  Urinary tract ultrasound 12/15/2018 and earlier. CT abdomen and pelvis 11/10/2018. FINDINGS: Right Kidney: Renal measurements: Approximately 11.6 x 5.7 x 5.8 cm = volume: 201 mL. Echogenic parenchyma. Lobular contour as noted previously. Mild to moderate hydronephrosis, unchanged since the ultrasound 3 weeks ago and unchanged since the 11/10/2018 CT. Left Kidney: Renal measurements: Approximately 10.8 x 5.2 x 4.1 cm = volume: 122 mL. Borderline echogenic parenchyma. Lobular contour as noted previously. Mild to moderate hydronephrosis, also unchanged since the ultrasound 3 weeks ago and the 11/10/2018 CT. Approximate 6 mm calculus in a mid calyx. Bladder: Decompressed by Foley catheter. Marked diffuse bladder wall thickening as noted on the prior examinations. Other: None. IMPRESSION: 1. Mild-to-moderate BILATERAL hydronephrosis, unchanged since the most recent prior ultrasound 12/15/2018 and unchanged since the CT 11/10/2018. 2. Bladder  decompressed by Foley catheter. Diffuse bladder wall thickening is unchanged from prior examinations and may reflect chronic cystitis. Electronically Signed   By: Evangeline Dakin M.D.   On: 01/04/2019 12:44        Scheduled Meds: . apixaban  5 mg Oral BID  . Chlorhexidine Gluconate Cloth  6 each Topical Q0600  . darbepoetin (ARANESP) injection - NON-DIALYSIS  60 mcg Subcutaneous Q Mon-1800  . feeding supplement (PRO-STAT SUGAR FREE 64)  30 mL Oral BID  . insulin aspart  0-15 Units Subcutaneous TID WC  . insulin aspart  0-5 Units Subcutaneous QHS  . insulin aspart  15 Units Subcutaneous TID WC  . insulin detemir  20 Units Subcutaneous Q24H  . mirtazapine  7.5 mg Oral QHS  . nutrition supplement (JUVEN)  1 packet Oral BID BM  . pantoprazole  40 mg Oral QHS   Continuous Infusions: . sodium chloride 10 mL/hr at 12/15/18 1400  . cefTRIAXone (ROCEPHIN)  IV    . ferumoxytol 510 mg (12/31/18 1703)     LOS: 27 days        Hosie Poisson, MD [facility]  01/04/2019, 1:20 PM

## 2019-01-05 LAB — GLUCOSE, CAPILLARY
Glucose-Capillary: 265 mg/dL — ABNORMAL HIGH (ref 70–99)
Glucose-Capillary: 146 mg/dL — ABNORMAL HIGH (ref 70–99)
Glucose-Capillary: 106 mg/dL — ABNORMAL HIGH (ref 70–99)
Glucose-Capillary: 96 mg/dL (ref 70–99)

## 2019-01-05 NOTE — Progress Notes (Signed)
PROGRESS NOTE    SUMEDH SHINSATO  FMB:[id number] DOB: 03/09/1960 DOA: 12/08/2018 PCP: Marliss Coots, NP   Brief Narrative:  58 year old male prior history of type 2 diabetes, stage III CKD, osteomyelitis, DVT, homelessness presents with DKA.  He was initially admitted to ICU for obtundation and DKA, AKI.  He was transferred to [facility] service after DKA was resolved.   Patient received IV iron on 12/21 and tolerated without any issues.  No events overnight Platelet count greater than 1 million, hematology consulted, suggested its probably reactive and recommended to follow-up with CBC in 1 to 2 weeks. Patient has been having some intermittent tachycardia and worsening renal parameters.  Repeat ultrasound of the renal shows stable hydronephrosis and chronic cystitis. Patient seen and examined at bedside no new complaints today.   Assessment & Plan:   Active Problems:   Type 2 diabetes mellitus with stage 3 chronic kidney disease (HCC)   Sepsis ([place])   Essential hypertension   Acute renal failure superimposed on chronic kidney disease (HCC)   DKA, type 1, not at goal [facility])   Protein-calorie malnutrition, severe   Acute lower UTI   Bacteremia due to Gram-negative bacteria   Bilateral hydronephrosis   DVT (deep venous thrombosis) (HCC)   Metabolic acidosis   Anemia due to chronic kidney disease   Pressure injury of skin   Hyperkalemia   Hyperglycemia   Pyelonephritis   Severe DKA:  Resolved. Hemoglobin A1c 14.3 on admission.  Uncontrolled Diabetes mellitus with CKD : cbgs have improved.   CBG (last 3)  Recent Labs    01/05/19 0749 01/05/19 1222 01/05/19 1708  GLUCAP 265* 146* 106*    Resume Lantus 20 units and Novolog 15 units TIDAC and continue with SSI.  No new complaints.      History of Serratia bacteremia and UTI on admission Completed 1 course of antibiotics. Patient is afebrile but his WBC count is elevated.  Urine cultures are growing greater than  100,000 serratia again, unclear If its actual infection or colonization.  Sensitivities are pending.  Resume IV rocephin.     Essential hypertension Well controlled.    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  Urine output has been adequate by 2.6 L overnight. Repeat ultrasound of the kidneys shows stable hydronephrosis but with chronic cystitis.  Continue to follow-up.  Bilateral hydronephrosis with ectopic ureter Continue with Foley catheter  Recommend outpatient follow-up with urology.   Anemia of chronic disease Probably secondary to CKD Transfuse to keep hemoglobin greater than 7.  Currently on Aranesp twice weekly. Iron level still low at 38.   Hemoglobin at 9.4 today No signs of bleeding.   Thrombocytosis:  Probably secondary to iron deficiency/reactive.  Discussed with Dr. Julien Nordmann (hematology and oncology) recommended 2 doses of IV iron and repeat CBC to check if platelets are improving.  Platelet count greater than 1 million today.  Hematology consulted suggested probably reactive from acute illness.  Recommend repeating CBC in 1 to 2 weeks to see if it is improving. CBC today shows platelets of 995000.   Patient continues to be asymptomatic.     Severe protein calorie malnutrition Patient on Remeron for appetite stimulation. Appetite is good, no complaints   Pressure ulcers on admission Stage II sacral pressure ulcer on admission Pressure Injury 06/28/18 Foot Right black thicken area on ball of foot (Active)  06/28/18 1732  Location: Foot (ball of right  foot)  Location Orientation: Right  Staging:   Wound Description (Comments): black thicken area on ball of foot  Present on Admission: Yes     Pressure Injury 12/13/18 Sacrum Left Stage II -  Partial thickness loss of dermis presenting as a shallow open ulcer with a red, pink wound bed without slough. (Active)  12/13/18 0800    Location: Sacrum  Location Orientation: Left  Staging: Stage II -  Partial thickness loss of dermis presenting as a shallow open ulcer with a red, pink wound bed without slough.  Wound Description (Comments):   Present on Admission:      Pressure Injury 12/13/18 Sacrum Right Stage II -  Partial thickness loss of dermis presenting as a shallow open ulcer with a red, pink wound bed without slough. (Active)  12/13/18 0800  Location: Sacrum  Location Orientation: Right  Staging: Stage II -  Partial thickness loss of dermis presenting as a shallow open ulcer with a red, pink wound bed without slough.  Wound Description (Comments):   Present on Admission:          DVT prophylaxis: Eliquis Code Status: Full code Family Communication: None at bedside Disposition Plan: SNF placement when bed is available.  Patient is not safe to discharge home   Consultants:   Nephrology and urology  Procedures: (None Antimicrobials:  Anti-infectives (From admission, onward)   Start     Dose/Rate Route Frequency Ordered Stop   01/04/19 2200  cefTRIAXone (ROCEPHIN) 1 g in sodium chloride 0.9 % 100 mL IVPB     1 g 200 mL/hr over 30 Minutes Intravenous Every 24 hours 01/03/19 1213     12/20/18 1600  piperacillin-tazobactam (ZOSYN) IVPB 3.375 g  Status:  Discontinued     3.375 g 12.5 mL/hr over 240 Minutes Intravenous Every 12 hours 12/20/18 1429 12/27/18 1053   12/19/18 1445  amoxicillin-clavulanate (AUGMENTIN) 500-125 MG per tablet 500 mg  Status:  Discontinued     1 tablet Oral Daily 12/19/18 1432 12/20/18 1424   12/19/18 1430  amoxicillin-clavulanate (AUGMENTIN) 875-125 MG per tablet 1 tablet  Status:  Discontinued     1 tablet Oral Every 12 hours 12/19/18 1427 12/19/18 1431   12/09/18 1200  ceFEPIme (MAXIPIME) 1 g in sodium chloride 0.9 % 100 mL IVPB  Status:  Discontinued     1 g 200 mL/hr over 30 Minutes Intravenous Every 24 hours 12/08/18 1452 12/09/18 0752   12/09/18 0900  cefTRIAXone  (ROCEPHIN) 2 g in sodium chloride 0.9 % 100 mL IVPB  Status:  Discontinued     2 g 200 mL/hr over 30 Minutes Intravenous Every 24 hours 12/09/18 0752 12/14/18 1302   12/08/18 1600  azithromycin (ZITHROMAX) 500 mg in sodium chloride 0.9 % 250 mL IVPB  Status:  Discontinued     500 mg 250 mL/hr over 60 Minutes Intravenous Every 24 hours 12/08/18 1508 12/09/18 0752   12/08/18 1452  vancomycin variable dose per unstable renal function (pharmacist dosing)  Status:  Discontinued      Does not apply See admin instructions 12/08/18 1452 12/09/18 0752   12/08/18 1100  vancomycin (VANCOCIN) 2,000 mg in sodium chloride 0.9 % 500 mL IVPB     2,000 mg 250 mL/hr over 120 Minutes Intravenous  Once 12/08/18 1050 12/08/18 1433   12/08/18 1045  ceFEPIme (MAXIPIME) 2 g in sodium chloride 0.9 % 100 mL IVPB     2 g 200 mL/hr over 30 Minutes Intravenous  Once 12/08/18 1031 12/08/18 1241  12/08/18 1045  metroNIDAZOLE (FLAGYL) IVPB 500 mg     500 mg 100 mL/hr over 60 Minutes Intravenous  Once 12/08/18 1031 12/08/18 1326   12/08/18 1045  vancomycin (VANCOCIN) IVPB 1000 mg/200 mL premix  Status:  Discontinued     1,000 mg 200 mL/hr over 60 Minutes Intravenous  Once 12/08/18 1031 12/08/18 1035       Subjective: Pt seen and examined at bedside, he is laying on the bed and comfortable, and denies any new complaints.    Objective: Vitals:   01/04/19 0509 01/04/19 1537 01/04/19 2014 01/05/19 0454  BP: 115/76 106/69 96/73 105/72  Pulse: (!) 102 (!) 105 (!) 112 100  Resp: 16  17 17   Temp: 98.2 F (36.8 C) 98.3 F (36.8 C) 98.7 F (37.1 C) 98.3 F (36.8 C)  TempSrc: Oral Oral Oral Oral  SpO2: 100% 100% 100% 99%  Weight:      Height:        Intake/Output Summary (Last 24 hours) at 01/05/2019 1720 Last data filed at 01/05/2019 1400 Gross per 24 hour  Intake 1520 ml  Output 1051 ml  Net 469 ml   Filed Weights   12/08/18 1200 12/08/18 2050 12/09/18 0300  Weight: 86.9 kg 60.8 kg 50 kg     Examination:  General exam: alert and comfortable.  Respiratory system: Clear to auscultation bilaterally, no wheezing or rhonchi Cardiovascular system: S1-S2 heard, regular rate rhythm, no JVD, no pedal edema Gastrointestinal system: Abdomen is soft, nontender, nondistended, bowel sounds normal Central nervous system patient is alert and oriented to place and person, no focal deficits  extremities: No pedal edema Skin: Stage II sacral pressure injury present on admission Psychiatry: Mood is appropriate    Data Reviewed: I have personally reviewed following labs and imaging studies  CBC: Recent Labs  Lab 12/30/18 0301 12/31/18 1153 01/02/19 0243 01/03/19 1227  WBC 10.2 10.8* 14.0* 15.0*  NEUTROABS 5.7 6.1 7.1  --   HGB 7.6* 8.1* 8.7* 9.4*  HCT 24.9* 26.0* 28.4* 31.2*  MCV 85.6 84.1 85.0 87.4  PLT 859* 974* 1,027* 979*   Basic Metabolic Panel: Recent Labs  Lab 12/30/18 0301 12/31/18 1153 01/02/19 0243 01/03/19 1227  NA 135 134* 134* 134*  K 4.7 4.9 4.8 4.6  CL 108 104 102 106  CO2 19* 19* 20* 18*  GLUCOSE 272* 384* 210* 345*  BUN 84* 94* 123* 130*  CREATININE 3.99* 4.34* 4.44* 4.56*  CALCIUM 8.4* 8.8* 9.1 9.0   GFR: Estimated Creatinine Clearance: 12.6 mL/min (A) (by C-G formula based on SCr of 4.56 mg/dL (H)). Liver Function Tests: No results for input(s): AST, ALT, ALKPHOS, BILITOT, PROT, ALBUMIN in the last 168 hours. No results for input(s): LIPASE, AMYLASE in the last 168 hours. No results for input(s): AMMONIA in the last 168 hours. Coagulation Profile: No results for input(s): INR, PROTIME in the last 168 hours. Cardiac Enzymes: No results for input(s): CKTOTAL, CKMB, CKMBINDEX, TROPONINI in the last 168 hours. BNP (last 3 results) No results for input(s): PROBNP in the last 8760 hours. HbA1C: No results for input(s): HGBA1C in the last 72 hours. CBG: Recent Labs  Lab 01/04/19 1659 01/04/19 2056 01/05/19 0749 01/05/19 1222 01/05/19 1708   GLUCAP 246* 118* 265* 146* 106*   Lipid Profile: No results for input(s): CHOL, HDL, LDLCALC, TRIG, CHOLHDL, LDLDIRECT in the last 72 hours. Thyroid Function Tests: No results for input(s): TSH, T4TOTAL, FREET4, T3FREE, THYROIDAB in the last 72 hours. Anemia Panel: No results for  input(s): VITAMINB12, FOLATE, FERRITIN, TIBC, IRON, RETICCTPCT in the last 72 hours. Sepsis Labs: No results for input(s): PROCALCITON, LATICACIDVEN in the last 168 hours.  Recent Results (from the past 240 hour(s))  Culture, Urine     Status: Abnormal (Preliminary result)   Collection Time: 01/03/19  6:03 PM   Specimen: Urine, Catheterized  Result Value Ref Range Status   Specimen Description URINE, CATHETERIZED  Final   Special Requests NONE  Final   Culture (A)  Final    >=100,000 COLONIES/mL SERRATIA MARCESCENS REPEATING SUSCEPTIBILITIES TO FOLLOW Performed at [facility], [address] [place], [place] [address]    Report Status PENDING  Incomplete         Radiology Studies: US Renal  Result Date: 01/04/2019 CLINICAL DATA:  Follow-up chronic BILATERAL hydronephrosis. EXAM: RENAL / URINARY TRACT ULTRASOUND COMPLETE COMPARISON:  Urinary tract ultrasound 12/15/2018 and earlier. CT abdomen and pelvis 11/10/2018. FINDINGS: Right Kidney: Renal measurements: Approximately 11.6 x 5.7 x 5.8 cm = volume: 201 mL. Echogenic parenchyma. Lobular contour as noted previously. Mild to moderate hydronephrosis, unchanged since the ultrasound 3 weeks ago and unchanged since the 11/10/2018 CT. Left Kidney: Renal measurements: Approximately 10.8 x 5.2 x 4.1 cm = volume: 122 mL. Borderline echogenic parenchyma. Lobular contour as noted previously. Mild to moderate hydronephrosis, also unchanged since the ultrasound 3 weeks ago and the 11/10/2018 CT. Approximate 6 mm calculus in a mid calyx. Bladder: Decompressed by Foley catheter. Marked diffuse bladder wall thickening as noted on the prior examinations. Other:  None. IMPRESSION: 1. Mild-to-moderate BILATERAL hydronephrosis, unchanged since the most recent prior ultrasound 12/15/2018 and unchanged since the CT 11/10/2018. 2. Bladder decompressed by Foley catheter. Diffuse bladder wall thickening is unchanged from prior examinations and may reflect chronic cystitis. Electronically Signed   By: Evangeline Dakin M.D.   On: 01/04/2019 12:44        Scheduled Meds: . apixaban  5 mg Oral BID  . Chlorhexidine Gluconate Cloth  6 each Topical Q0600  . darbepoetin (ARANESP) injection - NON-DIALYSIS  60 mcg Subcutaneous Q Mon-1800  . feeding supplement (PRO-STAT SUGAR FREE 64)  30 mL Oral BID  . insulin aspart  0-15 Units Subcutaneous TID WC  . insulin aspart  0-5 Units Subcutaneous QHS  . insulin aspart  15 Units Subcutaneous TID WC  . insulin detemir  20 Units Subcutaneous Q24H  . mirtazapine  7.5 mg Oral QHS  . nutrition supplement (JUVEN)  1 packet Oral BID BM  . pantoprazole  40 mg Oral QHS   Continuous Infusions: . sodium chloride 10 mL/hr at 12/15/18 1400  . cefTRIAXone (ROCEPHIN)  IV Stopped (01/04/19 2158)  . ferumoxytol 510 mg (12/31/18 1703)     LOS: 28 days        Hosie Poisson, MD [facility]  01/05/2019, 5:20 PM

## 2019-01-05 NOTE — Plan of Care (Signed)

## 2019-01-06 LAB — BASIC METABOLIC PANEL
Anion gap: 10 (ref 5–15)
BUN: 136 mg/dL — ABNORMAL HIGH (ref 6–20)
CO2: 21 mmol/L — ABNORMAL LOW (ref 22–32)
Calcium: 9.3 mg/dL (ref 8.9–10.3)
Chloride: 105 mmol/L (ref 98–111)
Creatinine, Ser: 4.32 mg/dL — ABNORMAL HIGH (ref 0.61–1.24)
GFR calc Af Amer: 16 mL/min — ABNORMAL LOW (ref >60)
GFR calc non Af Amer: 14 mL/min — ABNORMAL LOW (ref >60)
Glucose, Bld: 191 mg/dL — ABNORMAL HIGH (ref 70–99)
Potassium: 4.6 mmol/L (ref 3.5–5.1)
Sodium: 136 mmol/L (ref 135–145)

## 2019-01-06 LAB — CBC
HCT: 31.9 % — ABNORMAL LOW (ref 39.0–52.0)
Hemoglobin: 9.7 g/dL — ABNORMAL LOW (ref 13.0–17.0)
MCH: 26.6 pg (ref 26.0–34.0)
MCHC: 30.4 g/dL (ref 30.0–36.0)
MCV: 87.6 fL (ref 80.0–100.0)
Platelets: 861 10*3/uL — ABNORMAL HIGH (ref 150–400)
RBC: 3.64 MIL/uL — ABNORMAL LOW (ref 4.22–5.81)
RDW: 21 % — ABNORMAL HIGH (ref 11.5–15.5)
WBC: 15.9 10*3/uL — ABNORMAL HIGH (ref 4.0–10.5)
nRBC: 0.6 % — ABNORMAL HIGH (ref 0.0–0.2)

## 2019-01-06 LAB — URINE CULTURE: Culture: >=100000 — AB

## 2019-01-06 LAB — GLUCOSE, CAPILLARY
Glucose-Capillary: 207 mg/dL — ABNORMAL HIGH (ref 70–99)
Glucose-Capillary: 166 mg/dL — ABNORMAL HIGH (ref 70–99)
Glucose-Capillary: 92 mg/dL (ref 70–99)
Glucose-Capillary: 77 mg/dL (ref 70–99)

## 2019-01-06 NOTE — Plan of Care (Signed)

## 2019-01-06 NOTE — Progress Notes (Signed)
PROGRESS NOTE    Timothy Madden  PXT:[id number] DOB: 10-19-1960 DOA: 12/08/2018 PCP: Marliss Coots, NP   Brief Narrative:  58 year old male prior history of type 2 diabetes, stage III CKD, osteomyelitis, DVT, homelessness presents with DKA.  He was initially admitted to ICU for obtundation and DKA, AKI.  He was transferred to [facility] service after DKA was resolved.   Patient received IV iron on 12/21 and tolerated without any issues.  No events overnight Platelet count greater than 1 million, hematology consulted, suggested its probably reactive and recommended to follow-up with CBC in 1 to 2 weeks. Patient has been having some intermittent tachycardia and worsening renal parameters.  Repeat ultrasound of the renal shows stable hydronephrosis and chronic cystitis.  Assessment & Plan:   Active Problems:   Type 2 diabetes mellitus with stage 3 chronic kidney disease (HCC)   Sepsis ([place])   Essential hypertension   Acute renal failure superimposed on chronic kidney disease (HCC)   DKA, type 1, not at goal [facility]   Protein-calorie malnutrition, severe   Acute lower UTI   Bacteremia due to Gram-negative bacteria   Bilateral hydronephrosis   DVT (deep venous thrombosis) (HCC)   Metabolic acidosis   Anemia due to chronic kidney disease   Pressure injury of skin   Hyperkalemia   Hyperglycemia   Pyelonephritis   Severe DKA:  Resolved. Hemoglobin A1c 14.3 on admission.  Uncontrolled Diabetes mellitus with CKD : cbgs have improved.   CBG (last 3)  Recent Labs    01/06/19 0737 01/06/19 1136 01/06/19 1650  GLUCAP 207* 166* 92    Resume Lantus 20 units and Novolog 15 units TIDAC and continue with SSI.  No new complaints.      History of Serratia bacteremia and UTI on admission Completed 1 course of antibiotics. Patient is afebrile but his WBC count is elevated.  Urine cultures are growing greater than 100,000 serratia again, unclear If its actual infection or colonization.   Continue IV rocephin.     Essential hypertension Well controlled.    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  Urine output has been adequate by 2.6 L overnight. Repeat ultrasound of the kidneys shows stable hydronephrosis but with chronic cystitis.  Continue to follow-up.  Bilateral hydronephrosis with ectopic ureter Continue with Foley catheter  Recommend outpatient follow-up with urology.   Anemia of chronic disease Probably secondary to CKD Transfuse to keep hemoglobin greater than 7.  Currently on Aranesp twice weekly. Iron level still low at 38.   Hemoglobin at 9.4 today No signs of bleeding.   Thrombocytosis:  Probably secondary to iron deficiency/reactive.  Discussed with Dr. Julien Nordmann (hematology and oncology) recommended 2 doses of IV iron and repeat CBC to check if platelets are improving.  Platelet count greater than 1 million today.  Hematology consulted suggested probably reactive from acute illness.  Recommend repeating CBC in 1 to 2 weeks to see if it is improving. CBC today shows platelets of 995000.   Patient continues to be asymptomatic.     Severe protein calorie malnutrition Patient on Remeron for appetite stimulation. Appetite is good, no complaints   Pressure ulcers on admission Stage II sacral pressure ulcer on admission Pressure Injury 06/28/18 Foot Right black thicken area on ball of foot (Active)  06/28/18 1732  Location: Foot (ball of right foot)  Location Orientation: Right  Staging:   Wound Description (Comments): black thicken area  on ball of foot  Present on Admission: Yes     Pressure Injury 12/13/18 Sacrum Left Stage II -  Partial thickness loss of dermis presenting as a shallow open ulcer with a red, pink wound bed without slough. (Active)  12/13/18 0800  Location: Sacrum  Location Orientation: Left  Staging: Stage II -  Partial thickness loss of dermis  presenting as a shallow open ulcer with a red, pink wound bed without slough.  Wound Description (Comments):   Present on Admission:      Pressure Injury 12/13/18 Sacrum Right Stage II -  Partial thickness loss of dermis presenting as a shallow open ulcer with a red, pink wound bed without slough. (Active)  12/13/18 0800  Location: Sacrum  Location Orientation: Right  Staging: Stage II -  Partial thickness loss of dermis presenting as a shallow open ulcer with a red, pink wound bed without slough.  Wound Description (Comments):   Present on Admission:          DVT prophylaxis: Eliquis Code Status: Full code Family Communication: None at bedside Disposition Plan: SNF placement when bed is available.  Patient is not safe to discharge home   Consultants:   Nephrology and urology  Procedures: (None Antimicrobials:  Anti-infectives (From admission, onward)   Start     Dose/Rate Route Frequency Ordered Stop   01/04/19 2200  cefTRIAXone (ROCEPHIN) 1 g in sodium chloride 0.9 % 100 mL IVPB     1 g 200 mL/hr over 30 Minutes Intravenous Every 24 hours 01/03/19 1213     12/20/18 1600  piperacillin-tazobactam (ZOSYN) IVPB 3.375 g  Status:  Discontinued     3.375 g 12.5 mL/hr over 240 Minutes Intravenous Every 12 hours 12/20/18 1429 12/27/18 1053   12/19/18 1445  amoxicillin-clavulanate (AUGMENTIN) 500-125 MG per tablet 500 mg  Status:  Discontinued     1 tablet Oral Daily 12/19/18 1432 12/20/18 1424   12/19/18 1430  amoxicillin-clavulanate (AUGMENTIN) 875-125 MG per tablet 1 tablet  Status:  Discontinued     1 tablet Oral Every 12 hours 12/19/18 1427 12/19/18 1431   12/09/18 1200  ceFEPIme (MAXIPIME) 1 g in sodium chloride 0.9 % 100 mL IVPB  Status:  Discontinued     1 g 200 mL/hr over 30 Minutes Intravenous Every 24 hours 12/08/18 1452 12/09/18 0752   12/09/18 0900  cefTRIAXone (ROCEPHIN) 2 g in sodium chloride 0.9 % 100 mL IVPB  Status:  Discontinued     2 g 200 mL/hr over 30  Minutes Intravenous Every 24 hours 12/09/18 0752 12/14/18 1302   12/08/18 1600  azithromycin (ZITHROMAX) 500 mg in sodium chloride 0.9 % 250 mL IVPB  Status:  Discontinued     500 mg 250 mL/hr over 60 Minutes Intravenous Every 24 hours 12/08/18 1508 12/09/18 0752   12/08/18 1452  vancomycin variable dose per unstable renal function (pharmacist dosing)  Status:  Discontinued      Does not apply See admin instructions 12/08/18 1452 12/09/18 0752   12/08/18 1100  vancomycin (VANCOCIN) 2,000 mg in sodium chloride 0.9 % 500 mL IVPB     2,000 mg 250 mL/hr over 120 Minutes Intravenous  Once 12/08/18 1050 12/08/18 1433   12/08/18 1045  ceFEPIme (MAXIPIME) 2 g in sodium chloride 0.9 % 100 mL IVPB     2 g 200 mL/hr over 30 Minutes Intravenous  Once 12/08/18 1031 12/08/18 1241   12/08/18 1045  metroNIDAZOLE (FLAGYL) IVPB 500 mg     500 mg  100 mL/hr over 60 Minutes Intravenous  Once 12/08/18 1031 12/08/18 1326   12/08/18 1045  vancomycin (VANCOCIN) IVPB 1000 mg/200 mL premix  Status:  Discontinued     1,000 mg 200 mL/hr over 60 Minutes Intravenous  Once 12/08/18 1031 12/08/18 1035       Subjective: Denies any acute complaint no nausea no vomiting no fever no chills no chest pain abdominal pain.   Objective: Vitals:   01/05/19 0454 01/05/19 1737 01/05/19 2023 01/06/19 1451  BP: 105/72 (!) 117/95 108/70 107/73  Pulse: 100 (!) 102 (!) 109 (!) 109  Resp: 17 16  17   Temp: 98.3 F (36.8 C) 97.8 F (36.6 C) 99.3 F (37.4 C) 98.6 F (37 C)  TempSrc: Oral Axillary Oral Oral  SpO2: 99% 100% 100% 99%  Weight:      Height:        Intake/Output Summary (Last 24 hours) at 01/06/2019 1857 Last data filed at 01/06/2019 1801 Gross per 24 hour  Intake 1930 ml  Output 1950 ml  Net -20 ml   Filed Weights   12/08/18 1200 12/08/18 2050 12/09/18 0300  Weight: 86.9 kg 60.8 kg 50 kg    Examination:  General exam: alert and comfortable.  Respiratory system: Clear to auscultation bilaterally, no  wheezing or rhonchi Cardiovascular system: S1-S2 heard, regular rate rhythm, no JVD, no pedal edema Gastrointestinal system: Abdomen is soft, nontender, nondistended, bowel sounds normal Central nervous system patient is alert and oriented to place and person, no focal deficits  extremities: No pedal edema Skin: Stage II sacral pressure injury present on admission Psychiatry: Mood is appropriate    Data Reviewed: I have personally reviewed following labs and imaging studies  CBC: Recent Labs  Lab 12/31/18 1153 01/02/19 0243 01/03/19 1227 01/06/19 0402  WBC 10.8* 14.0* 15.0* 15.9*  NEUTROABS 6.1 7.1  --   --   HGB 8.1* 8.7* 9.4* 9.7*  HCT 26.0* 28.4* 31.2* 31.9*  MCV 84.1 85.0 87.4 87.6  PLT 974* 1,027* 995* 263*   Basic Metabolic Panel: Recent Labs  Lab 12/31/18 1153 01/02/19 0243 01/03/19 1227 01/06/19 0402  NA 134* 134* 134* 136  K 4.9 4.8 4.6 4.6  CL 104 102 106 105  CO2 19* 20* 18* 21*  GLUCOSE 384* 210* 345* 191*  BUN 94* 123* 130* 136*  CREATININE 4.34* 4.44* 4.56* 4.32*  CALCIUM 8.8* 9.1 9.0 9.3   GFR: Estimated Creatinine Clearance: 13.3 mL/min (A) (by C-G formula based on SCr of 4.32 mg/dL (H)). Liver Function Tests: No results for input(s): AST, ALT, ALKPHOS, BILITOT, PROT, ALBUMIN in the last 168 hours. No results for input(s): LIPASE, AMYLASE in the last 168 hours. No results for input(s): AMMONIA in the last 168 hours. Coagulation Profile: No results for input(s): INR, PROTIME in the last 168 hours. Cardiac Enzymes: No results for input(s): CKTOTAL, CKMB, CKMBINDEX, TROPONINI in the last 168 hours. BNP (last 3 results) No results for input(s): PROBNP in the last 8760 hours. HbA1C: No results for input(s): HGBA1C in the last 72 hours. CBG: Recent Labs  Lab 01/05/19 1708 01/05/19 2120 01/06/19 0737 01/06/19 1136 01/06/19 1650  GLUCAP 106* 96 207* 166* 92   Lipid Profile: No results for input(s): CHOL, HDL, LDLCALC, TRIG, CHOLHDL, LDLDIRECT  in the last 72 hours. Thyroid Function Tests: No results for input(s): TSH, T4TOTAL, FREET4, T3FREE, THYROIDAB in the last 72 hours. Anemia Panel: No results for input(s): VITAMINB12, FOLATE, FERRITIN, TIBC, IRON, RETICCTPCT in the last 72 hours. Sepsis Labs:  No results for input(s): PROCALCITON, LATICACIDVEN in the last 168 hours.  Recent Results (from the past 240 hour(s))  Culture, Urine     Status: Abnormal   Collection Time: 01/03/19  6:03 PM   Specimen: Urine, Catheterized  Result Value Ref Range Status   Specimen Description URINE, CATHETERIZED  Final   Special Requests   Final    NONE Performed at [facility], [address] [place], [place] [address]    Culture >=100,000 COLONIES/mL SERRATIA MARCESCENS (A)  Final   Report Status 01/06/2019 FINAL  Final   Organism ID, Bacteria SERRATIA MARCESCENS (A)  Final      Susceptibility   Serratia marcescens - MIC*    CEFAZOLIN >=64 RESISTANT Resistant     CEFTRIAXONE <=1 SENSITIVE Sensitive     CIPROFLOXACIN <=0.25 SENSITIVE Sensitive     GENTAMICIN <=1 SENSITIVE Sensitive     NITROFURANTOIN 256 RESISTANT Resistant     TRIMETH/SULFA <=20 SENSITIVE Sensitive     * >=100,000 COLONIES/mL SERRATIA MARCESCENS         Radiology Studies: No results found.      Scheduled Meds: . apixaban  5 mg Oral BID  . Chlorhexidine Gluconate Cloth  6 each Topical Q0600  . darbepoetin (ARANESP) injection - NON-DIALYSIS  60 mcg Subcutaneous Q Mon-1800  . feeding supplement (PRO-STAT SUGAR FREE 64)  30 mL Oral BID  . insulin aspart  0-15 Units Subcutaneous TID WC  . insulin aspart  0-5 Units Subcutaneous QHS  . insulin aspart  15 Units Subcutaneous TID WC  . insulin detemir  20 Units Subcutaneous Q24H  . mirtazapine  7.5 mg Oral QHS  . nutrition supplement (JUVEN)  1 packet Oral BID BM  . pantoprazole  40 mg Oral QHS   Continuous Infusions: . sodium chloride 10 mL/hr at 12/15/18 1400  . cefTRIAXone (ROCEPHIN)  IV Stopped  (01/05/19 2202)  . ferumoxytol 510 mg (12/31/18 1703)     LOS: 29 days        Berle Mull, MD [facility]  01/06/2019, 6:57 PM

## 2019-01-07 LAB — GLUCOSE, CAPILLARY
Glucose-Capillary: 183 mg/dL — ABNORMAL HIGH (ref 70–99)
Glucose-Capillary: 102 mg/dL — ABNORMAL HIGH (ref 70–99)
Glucose-Capillary: 108 mg/dL — ABNORMAL HIGH (ref 70–99)
Glucose-Capillary: 123 mg/dL — ABNORMAL HIGH (ref 70–99)

## 2019-01-07 NOTE — Progress Notes (Signed)
PROGRESS NOTE    Timothy Madden  FIE:[id number] DOB: Aug 25, 1960 DOA: 12/08/2018 PCP: Marliss Coots, NP   Brief Narrative:  58 year old male prior history of type 2 diabetes, stage III CKD, osteomyelitis, DVT, homelessness presents with DKA.  He was initially admitted to ICU for obtundation and DKA, AKI.  He was transferred to [facility] service after DKA was resolved.   Patient received IV iron on 12/21 and tolerated without any issues.  No events overnight Platelet count greater than 1 million, hematology consulted, suggested its probably reactive and recommended to follow-up with CBC in 1 to 2 weeks. Patient has been having some intermittent tachycardia and worsening renal parameters.  Repeat ultrasound of the renal shows stable hydronephrosis and chronic cystitis. Patient seen and examined today  , no new complaints today.   Assessment & Plan:   Active Problems:   Type 2 diabetes mellitus with stage 3 chronic kidney disease (HCC)   Sepsis ([place])   Essential hypertension   Acute renal failure superimposed on chronic kidney disease (HCC)   DKA, type 1, not at goal [facility])   Protein-calorie malnutrition, severe   Acute lower UTI   Bacteremia due to Gram-negative bacteria   Bilateral hydronephrosis   DVT (deep venous thrombosis) (HCC)   Metabolic acidosis   Anemia due to chronic kidney disease   Pressure injury of skin   Hyperkalemia   Hyperglycemia   Pyelonephritis   Severe DKA:  Resolved. Hemoglobin A1c 14.3 on admission.   Diabetes Mellitus: Well controlled cbg's   CBG (last 3)  Recent Labs    01/06/19 2056 01/07/19 0734 01/07/19 1135  GLUCAP 77 183* 102*    Resume Lantus 20 units and Novolog 15 units TIDAC and continue with SSI.  Nno changes in medications.  Change diet to carb modified diet/ renal diet.      History of Serratia bacteremia and UTI on admission Completed a course of antibiotics already but  though Patient is afebrile his WBC count started  going up and his repeat   Urine cultures are growing greater than 100,000 serratia again? Chronic cystitis, unclear If its actual infection or colonization. Recommend another course of IV rocephin for 5 days and stop.     Essential hypertension Well controlled.    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  Repeat ultrasound of the kidneys shows stable hydronephrosis but with chronic cystitis.  Continue to follow-up.  Bilateral hydronephrosis with ectopic ureter Continue with Foley catheter , it was replaced on 12/18.  Recommend outpatient follow-up with urology.   Anemia of chronic disease Probably secondary to CKD Transfuse to keep hemoglobin greater than 7.  Currently on Aranesp twice weekly. Iron level still low at 38.   Hemoglobin at 9.7 No signs of bleeding.   Thrombocytosis:  Probably secondary to iron deficiency/reactive.  Discussed with Dr. Julien Nordmann (hematology and oncology) recommended 2 doses of IV iron and repeat CBC to check if platelets are improving.  Platelet count greater than 1 million.  Hematology consulted suggested probably reactive from acute illness.  Recommend repeating CBC in 1 to 2 weeks to see if it is improving. CBC today shows platelet count of 861000, improving.  Patient continues to be asymptomatic.     Severe protein calorie malnutrition Patient on Remeron for appetite stimulation. Appetite is good, no complaints, advanced diet to renal /carb modified diet.    Pressure ulcers on admission Stage II sacral pressure ulcer on  admission Pressure Injury 06/28/18 Foot Right black thicken area on ball of foot (Active)  06/28/18 1732  Location: Foot (ball of right foot)  Location Orientation: Right  Staging:   Wound Description (Comments): black thicken area on ball of foot  Present on Admission: Yes     Pressure Injury 12/13/18 Sacrum Left Stage II -  Partial thickness loss of dermis  presenting as a shallow open ulcer with a red, pink wound bed without slough. (Active)  12/13/18 0800  Location: Sacrum  Location Orientation: Left  Staging: Stage II -  Partial thickness loss of dermis presenting as a shallow open ulcer with a red, pink wound bed without slough.  Wound Description (Comments):   Present on Admission:      Pressure Injury 12/13/18 Sacrum Right Stage II -  Partial thickness loss of dermis presenting as a shallow open ulcer with a red, pink wound bed without slough. (Active)  12/13/18 0800  Location: Sacrum  Location Orientation: Right  Staging: Stage II -  Partial thickness loss of dermis presenting as a shallow open ulcer with a red, pink wound bed without slough.  Wound Description (Comments):   Present on Admission:          DVT prophylaxis: Eliquis Code Status: Full code Family Communication: None at bedside Disposition Plan: SNF placement when bed is available.  Patient is not safe to discharge home   Consultants:   Nephrology and urology  Procedures: (None Antimicrobials:  Anti-infectives (From admission, onward)   Start     Dose/Rate Route Frequency Ordered Stop   01/04/19 2200  cefTRIAXone (ROCEPHIN) 1 g in sodium chloride 0.9 % 100 mL IVPB     1 g 200 mL/hr over 30 Minutes Intravenous Every 24 hours 01/03/19 1213     12/20/18 1600  piperacillin-tazobactam (ZOSYN) IVPB 3.375 g  Status:  Discontinued     3.375 g 12.5 mL/hr over 240 Minutes Intravenous Every 12 hours 12/20/18 1429 12/27/18 1053   12/19/18 1445  amoxicillin-clavulanate (AUGMENTIN) 500-125 MG per tablet 500 mg  Status:  Discontinued     1 tablet Oral Daily 12/19/18 1432 12/20/18 1424   12/19/18 1430  amoxicillin-clavulanate (AUGMENTIN) 875-125 MG per tablet 1 tablet  Status:  Discontinued     1 tablet Oral Every 12 hours 12/19/18 1427 12/19/18 1431   12/09/18 1200  ceFEPIme (MAXIPIME) 1 g in sodium chloride 0.9 % 100 mL IVPB  Status:  Discontinued     1 g 200 mL/hr  over 30 Minutes Intravenous Every 24 hours 12/08/18 1452 12/09/18 0752   12/09/18 0900  cefTRIAXone (ROCEPHIN) 2 g in sodium chloride 0.9 % 100 mL IVPB  Status:  Discontinued     2 g 200 mL/hr over 30 Minutes Intravenous Every 24 hours 12/09/18 0752 12/14/18 1302   12/08/18 1600  azithromycin (ZITHROMAX) 500 mg in sodium chloride 0.9 % 250 mL IVPB  Status:  Discontinued     500 mg 250 mL/hr over 60 Minutes Intravenous Every 24 hours 12/08/18 1508 12/09/18 0752   12/08/18 1452  vancomycin variable dose per unstable renal function (pharmacist dosing)  Status:  Discontinued      Does not apply See admin instructions 12/08/18 1452 12/09/18 0752   12/08/18 1100  vancomycin (VANCOCIN) 2,000 mg in sodium chloride 0.9 % 500 mL IVPB     2,000 mg 250 mL/hr over 120 Minutes Intravenous  Once 12/08/18 1050 12/08/18 1433   12/08/18 1045  ceFEPIme (MAXIPIME) 2 g in sodium chloride 0.9 %  100 mL IVPB     2 g 200 mL/hr over 30 Minutes Intravenous  Once 12/08/18 1031 12/08/18 1241   12/08/18 1045  metroNIDAZOLE (FLAGYL) IVPB 500 mg     500 mg 100 mL/hr over 60 Minutes Intravenous  Once 12/08/18 1031 12/08/18 1326   12/08/18 1045  vancomycin (VANCOCIN) IVPB 1000 mg/200 mL premix  Status:  Discontinued     1,000 mg 200 mL/hr over 60 Minutes Intravenous  Once 12/08/18 1031 12/08/18 1035       Subjective: Pt is laying in the chair, no chest pain or sob.     Objective: Vitals:   01/06/19 1945 01/06/19 2140 01/07/19 0611 01/07/19 1411  BP: 93/61 117/82 110/68 99/73  Pulse: (!) 110 (!) 103 (!) 105 (!) 110  Resp: 18 18 18 18   Temp: 98.6 F (37 C)  98.4 F (36.9 C) 98.4 F (36.9 C)  TempSrc: Oral  Oral Oral  SpO2: 100% 100% 100% 100%  Weight:      Height:        Intake/Output Summary (Last 24 hours) at 01/07/2019 1507 Last data filed at 01/07/2019 1400 Gross per 24 hour  Intake 1602 ml  Output 1976 ml  Net -374 ml   Filed Weights   12/08/18 1200 12/08/18 2050 12/09/18 0300  Weight: 86.9 kg  60.8 kg 50 kg    Examination:  General exam:alert and comfortable.  Respiratory system: Clear to auscultation bilaterally no wheezing or rhonchi. Cardiovascular system: S1-S2 heard, regular rate rhythm, no JVD Gastrointestinal system: Abdomen is soft, nontender, nondistended, bowel sounds normal Central nervous system patient is alert and oriented, no focal deficits Extremities: No pedal edema Skin: Stage II sacral pressure injury present on admission Psychiatry: Mood is appropriate    Data Reviewed: I have personally reviewed following labs and imaging studies  CBC: Recent Labs  Lab 01/02/19 0243 01/03/19 1227 01/06/19 0402  WBC 14.0* 15.0* 15.9*  NEUTROABS 7.1  --   --   HGB 8.7* 9.4* 9.7*  HCT 28.4* 31.2* 31.9*  MCV 85.0 87.4 87.6  PLT 1,027* 995* 630*   Basic Metabolic Panel: Recent Labs  Lab 01/02/19 0243 01/03/19 1227 01/06/19 0402  NA 134* 134* 136  K 4.8 4.6 4.6  CL 102 106 105  CO2 20* 18* 21*  GLUCOSE 210* 345* 191*  BUN 123* 130* 136*  CREATININE 4.44* 4.56* 4.32*  CALCIUM 9.1 9.0 9.3   GFR: Estimated Creatinine Clearance: 13.3 mL/min (A) (by C-G formula based on SCr of 4.32 mg/dL (H)). Liver Function Tests: No results for input(s): AST, ALT, ALKPHOS, BILITOT, PROT, ALBUMIN in the last 168 hours. No results for input(s): LIPASE, AMYLASE in the last 168 hours. No results for input(s): AMMONIA in the last 168 hours. Coagulation Profile: No results for input(s): INR, PROTIME in the last 168 hours. Cardiac Enzymes: No results for input(s): CKTOTAL, CKMB, CKMBINDEX, TROPONINI in the last 168 hours. BNP (last 3 results) No results for input(s): PROBNP in the last 8760 hours. HbA1C: No results for input(s): HGBA1C in the last 72 hours. CBG: Recent Labs  Lab 01/06/19 1136 01/06/19 1650 01/06/19 2056 01/07/19 0734 01/07/19 1135  GLUCAP 166* 92 77 183* 102*   Lipid Profile: No results for input(s): CHOL, HDL, LDLCALC, TRIG, CHOLHDL, LDLDIRECT in  the last 72 hours. Thyroid Function Tests: No results for input(s): TSH, T4TOTAL, FREET4, T3FREE, THYROIDAB in the last 72 hours. Anemia Panel: No results for input(s): VITAMINB12, FOLATE, FERRITIN, TIBC, IRON, RETICCTPCT in the last 72  hours. Sepsis Labs: No results for input(s): PROCALCITON, LATICACIDVEN in the last 168 hours.  Recent Results (from the past 240 hour(s))  Culture, Urine     Status: Abnormal   Collection Time: 01/03/19  6:03 PM   Specimen: Urine, Catheterized  Result Value Ref Range Status   Specimen Description URINE, CATHETERIZED  Final   Special Requests   Final    NONE Performed at [facility], [address] [place], [place] [address]    Culture >=100,000 COLONIES/mL SERRATIA MARCESCENS (A)  Final   Report Status 01/06/2019 FINAL  Final   Organism ID, Bacteria SERRATIA MARCESCENS (A)  Final      Susceptibility   Serratia marcescens - MIC*    CEFAZOLIN >=64 RESISTANT Resistant     CEFTRIAXONE <=1 SENSITIVE Sensitive     CIPROFLOXACIN <=0.25 SENSITIVE Sensitive     GENTAMICIN <=1 SENSITIVE Sensitive     NITROFURANTOIN 256 RESISTANT Resistant     TRIMETH/SULFA <=20 SENSITIVE Sensitive     * >=100,000 COLONIES/mL SERRATIA MARCESCENS         Radiology Studies: No results found.      Scheduled Meds: . apixaban  5 mg Oral BID  . Chlorhexidine Gluconate Cloth  6 each Topical Q0600  . darbepoetin (ARANESP) injection - NON-DIALYSIS  60 mcg Subcutaneous Q Mon-1800  . feeding supplement (PRO-STAT SUGAR FREE 64)  30 mL Oral BID  . insulin aspart  0-15 Units Subcutaneous TID WC  . insulin aspart  0-5 Units Subcutaneous QHS  . insulin aspart  15 Units Subcutaneous TID WC  . insulin detemir  20 Units Subcutaneous Q24H  . mirtazapine  7.5 mg Oral QHS  . nutrition supplement (JUVEN)  1 packet Oral BID BM  . pantoprazole  40 mg Oral QHS   Continuous Infusions: . sodium chloride 10 mL/hr at 12/15/18 1400  . cefTRIAXone (ROCEPHIN)  IV Stopped  (01/06/19 2206)  . ferumoxytol 510 mg (12/31/18 1703)     LOS: 30 days        Hosie Poisson, MD [facility]  01/07/2019, 3:07 PM

## 2019-01-08 LAB — GLUCOSE, CAPILLARY
Glucose-Capillary: 102 mg/dL — ABNORMAL HIGH (ref 70–99)
Glucose-Capillary: 155 mg/dL — ABNORMAL HIGH (ref 70–99)
Glucose-Capillary: 215 mg/dL — ABNORMAL HIGH (ref 70–99)
Glucose-Capillary: 79 mg/dL (ref 70–99)

## 2019-01-08 NOTE — Progress Notes (Signed)
PROGRESS NOTE    Timothy Madden  MWN:[id number] DOB: 02/05/1960 DOA: 12/08/2018 PCP: Marliss Coots, NP   Brief Narrative:  Patient is a 58 year old male prior history of type 2 diabetes, stage III CKD, osteomyelitis, DVT, homelessness presents with DKA.  He was initially admitted to ICU for DKA with obtundation and acute kidney injury.  Patient was transferred to [facility] service after DKA was resolved.  Patient has had significantly elevated platelet count, but now improving.  Hematology/oncology team has advised monitoring the platelet count closely and recommended to follow-up with CBC in 1 to 2 weeks.  At baseline, patient has CKD 3B/4.  AKI has improved significantly.  Patient may have a new baseline renal function.  Continue to monitor closely.  Repeat ultrasound of the renal shows stable hydronephrosis and chronic cystitis.  Patient is awaiting disposition.  01/08/2019: Patient seen.  No new changes.  No new complaints from the patient.  Patient remains stable for discharge.   Assessment & Plan:   Active Problems:   Type 2 diabetes mellitus with stage 3 chronic kidney disease (HCC)   Sepsis ([place])   Essential hypertension   Acute renal failure superimposed on chronic kidney disease (HCC)   DKA, type 1, not at goal [facility])   Protein-calorie malnutrition, severe   Acute lower UTI   Bacteremia due to Gram-negative bacteria   Bilateral hydronephrosis   DVT (deep venous thrombosis) (HCC)   Metabolic acidosis   Anemia due to chronic kidney disease   Pressure injury of skin   Hyperkalemia   Hyperglycemia   Pyelonephritis   Diabetes mellitus with severe DKA: -Resolved. -Hemoglobin A1c was 14.3 on admission. -Blood sugar is currently controlled with subcutaneous Levemir 20 units once daily, subcutaneous NovoLog 15 units with meals 3 times daily and sliding scale insulin coverage.  CBG (last 3)  Recent Labs    01/07/19 1710 01/07/19 2149 01/08/19 0736  GLUCAP 108* 123* 102*    History of Serratia bacteremia and UTI on admission Completed a course of antibiotics already but  though Patient is afebrile his WBC count started going up and his repeat   Urine cultures are growing greater than 100,000 serratia again? Chronic cystitis, unclear If its actual infection or colonization. Recommend another course of IV rocephin for 5 days and stop.  01/08/2019: Repeat urinalysis and urine culture.  Complete course of IV antibiotics for UTI.  Essential hypertension Well controlled.   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  Repeat ultrasound of the kidneys shows stable hydronephrosis but with chronic cystitis.  Continue to follow-up. 01/08/2019: Acute kidney injury has resolved significantly.  Patient may have a new baseline serum creatinine of around 4's.  Continue to monitor closely.  Repeat renal function in the morning.  Bilateral hydronephrosis with ectopic ureter Continue with Foley catheter , it was replaced on 12/18.  Recommend outpatient follow-up with urology.  Anemia of chronic disease Probably secondary to CKD Transfuse to keep hemoglobin greater than 7.  Currently on Aranesp twice weekly. Iron level still low at 38.   Hemoglobin at 9.7 No signs of bleeding.  01/08/2019: Repeat CBC in the morning.  Thrombocytosis:  Probably secondary to iron deficiency/reactive.  Discussed with Dr. Julien Nordmann (hematology and oncology) recommended 2 doses of IV iron and repeat CBC to check if platelets are improving.  Platelet count greater than 1 million.  Hematology consulted suggested probably reactive from acute illness.  Recommend repeating CBC in  1 to 2 weeks to see if it is improving. CBC today shows platelet count of 861000, improving.  Patient continues to be asymptomatic. 01/08/2019: Thrombocytosis seems to be improving.  Severe protein calorie malnutrition Patient on Remeron for appetite  stimulation. Appetite is good, no complaints, advanced diet to renal /carb modified diet.  01/08/2019: Continue to monitor closely.  Pressure ulcers on admission Stage II sacral pressure ulcer on admission Pressure Injury 06/28/18 Foot Right black thicken area on ball of foot (Active)  06/28/18 1732  Location: Foot (ball of right foot)  Location Orientation: Right  Staging:   Wound Description (Comments): black thicken area on ball of foot  Present on Admission: Yes     Pressure Injury 12/13/18 Sacrum Left Stage II -  Partial thickness loss of dermis presenting as a shallow open ulcer with a red, pink wound bed without slough. (Active)  12/13/18 0800  Location: Sacrum  Location Orientation: Left  Staging: Stage II -  Partial thickness loss of dermis presenting as a shallow open ulcer with a red, pink wound bed without slough.  Wound Description (Comments):   Present on Admission:      Pressure Injury 12/13/18 Sacrum Right Stage II -  Partial thickness loss of dermis presenting as a shallow open ulcer with a red, pink wound bed without slough. (Active)  12/13/18 0800  Location: Sacrum  Location Orientation: Right  Staging: Stage II -  Partial thickness loss of dermis presenting as a shallow open ulcer with a red, pink wound bed without slough.  Wound Description (Comments):   Present on Admission:    DVT prophylaxis: Eliquis Code Status: Full code Family Communication: None at bedside Disposition Plan: SNF placement when bed is available.  Patient is not safe to discharge home   Consultants:   Nephrology and urology  Procedures: (None Antimicrobials:  Anti-infectives (From admission, onward)   Start     Dose/Rate Route Frequency Ordered Stop   01/04/19 2200  cefTRIAXone (ROCEPHIN) 1 g in sodium chloride 0.9 % 100 mL IVPB     1 g 200 mL/hr over 30 Minutes Intravenous Every 24 hours 01/03/19 1213     12/20/18 1600  piperacillin-tazobactam (ZOSYN) IVPB 3.375 g  Status:   Discontinued     3.375 g 12.5 mL/hr over 240 Minutes Intravenous Every 12 hours 12/20/18 1429 12/27/18 1053   12/19/18 1445  amoxicillin-clavulanate (AUGMENTIN) 500-125 MG per tablet 500 mg  Status:  Discontinued     1 tablet Oral Daily 12/19/18 1432 12/20/18 1424   12/19/18 1430  amoxicillin-clavulanate (AUGMENTIN) 875-125 MG per tablet 1 tablet  Status:  Discontinued     1 tablet Oral Every 12 hours 12/19/18 1427 12/19/18 1431   12/09/18 1200  ceFEPIme (MAXIPIME) 1 g in sodium chloride 0.9 % 100 mL IVPB  Status:  Discontinued     1 g 200 mL/hr over 30 Minutes Intravenous Every 24 hours 12/08/18 1452 12/09/18 0752   12/09/18 0900  cefTRIAXone (ROCEPHIN) 2 g in sodium chloride 0.9 % 100 mL IVPB  Status:  Discontinued     2 g 200 mL/hr over 30 Minutes Intravenous Every 24 hours 12/09/18 0752 12/14/18 1302   12/08/18 1600  azithromycin (ZITHROMAX) 500 mg in sodium chloride 0.9 % 250 mL IVPB  Status:  Discontinued     500 mg 250 mL/hr over 60 Minutes Intravenous Every 24 hours 12/08/18 1508 12/09/18 0752   12/08/18 1452  vancomycin variable dose per unstable renal function (pharmacist dosing)  Status:  Discontinued      Does not apply See admin instructions 12/08/18 1452 12/09/18 0752   12/08/18 1100  vancomycin (VANCOCIN) 2,000 mg in sodium chloride 0.9 % 500 mL IVPB     2,000 mg 250 mL/hr over 120 Minutes Intravenous  Once 12/08/18 1050 12/08/18 1433   12/08/18 1045  ceFEPIme (MAXIPIME) 2 g in sodium chloride 0.9 % 100 mL IVPB     2 g 200 mL/hr over 30 Minutes Intravenous  Once 12/08/18 1031 12/08/18 1241   12/08/18 1045  metroNIDAZOLE (FLAGYL) IVPB 500 mg     500 mg 100 mL/hr over 60 Minutes Intravenous  Once 12/08/18 1031 12/08/18 1326   12/08/18 1045  vancomycin (VANCOCIN) IVPB 1000 mg/200 mL premix  Status:  Discontinued     1,000 mg 200 mL/hr over 60 Minutes Intravenous  Once 12/08/18 1031 12/08/18 1035      Subjective: No new complaints No fever or chills No shortness of  breath Patient reports being stable.  Objective: Vitals:   01/07/19 1411 01/07/19 1614 01/07/19 2157 01/08/19 0401  BP: 99/73 106/72 (!) 125/93 116/85  Pulse: (!) 110 (!) 111 (!) 106 (!) 101  Resp: 18 18 16 14   Temp: 98.4 F (36.9 C)  98.1 F (36.7 C) 98.4 F (36.9 C)  TempSrc: Oral  Oral Oral  SpO2: 100% 100% 100% 100%  Weight:      Height:        Intake/Output Summary (Last 24 hours) at 01/08/2019 0952 Last data filed at 01/08/2019 0600 Gross per 24 hour  Intake 1427.8 ml  Output 2500 ml  Net -1072.2 ml   Filed Weights   12/08/18 1200 12/08/18 2050 12/09/18 0300  Weight: 86.9 kg 60.8 kg 50 kg    Examination:  General exam: Alert and comfortable. Patient is cachectic. Respiratory system: Clear to auscultation Cardiovascular system: S1-S2  Gastrointestinal system: Abdomen is soft, nontender, nondistended, bowel sounds normal Central nervous system patient is alert and oriented, no focal deficits Extremities: No pedal edema  Data Reviewed: I have personally reviewed following labs and imaging studies  CBC: Recent Labs  Lab 01/02/19 0243 01/03/19 1227 01/06/19 0402  WBC 14.0* 15.0* 15.9*  NEUTROABS 7.1  --   --   HGB 8.7* 9.4* 9.7*  HCT 28.4* 31.2* 31.9*  MCV 85.0 87.4 87.6  PLT 1,027* 995* 852*   Basic Metabolic Panel: Recent Labs  Lab 01/02/19 0243 01/03/19 1227 01/06/19 0402  NA 134* 134* 136  K 4.8 4.6 4.6  CL 102 106 105  CO2 20* 18* 21*  GLUCOSE 210* 345* 191*  BUN 123* 130* 136*  CREATININE 4.44* 4.56* 4.32*  CALCIUM 9.1 9.0 9.3   GFR: Estimated Creatinine Clearance: 13.3 mL/min (A) (by C-G formula based on SCr of 4.32 mg/dL (H)). Liver Function Tests: No results for input(s): AST, ALT, ALKPHOS, BILITOT, PROT, ALBUMIN in the last 168 hours. No results for input(s): LIPASE, AMYLASE in the last 168 hours. No results for input(s): AMMONIA in the last 168 hours. Coagulation Profile: No results for input(s): INR, PROTIME in the last 168  hours. Cardiac Enzymes: No results for input(s): CKTOTAL, CKMB, CKMBINDEX, TROPONINI in the last 168 hours. BNP (last 3 results) No results for input(s): PROBNP in the last 8760 hours. HbA1C: No results for input(s): HGBA1C in the last 72 hours. CBG: Recent Labs  Lab 01/07/19 0734 01/07/19 1135 01/07/19 1710 01/07/19 2149 01/08/19 0736  GLUCAP 183* 102* 108* 123* 102*   Lipid Profile: No results for input(s):  CHOL, HDL, LDLCALC, TRIG, CHOLHDL, LDLDIRECT in the last 72 hours. Thyroid Function Tests: No results for input(s): TSH, T4TOTAL, FREET4, T3FREE, THYROIDAB in the last 72 hours. Anemia Panel: No results for input(s): VITAMINB12, FOLATE, FERRITIN, TIBC, IRON, RETICCTPCT in the last 72 hours. Sepsis Labs: No results for input(s): PROCALCITON, LATICACIDVEN in the last 168 hours.  Recent Results (from the past 240 hour(s))  Culture, Urine     Status: Abnormal   Collection Time: 01/03/19  6:03 PM   Specimen: Urine, Catheterized  Result Value Ref Range Status   Specimen Description URINE, CATHETERIZED  Final   Special Requests   Final    NONE Performed at [facility], [address] [place], [place] [address]    Culture >=100,000 COLONIES/mL SERRATIA MARCESCENS (A)  Final   Report Status 01/06/2019 FINAL  Final   Organism ID, Bacteria SERRATIA MARCESCENS (A)  Final      Susceptibility   Serratia marcescens - MIC*    CEFAZOLIN >=64 RESISTANT Resistant     CEFTRIAXONE <=1 SENSITIVE Sensitive     CIPROFLOXACIN <=0.25 SENSITIVE Sensitive     GENTAMICIN <=1 SENSITIVE Sensitive     NITROFURANTOIN 256 RESISTANT Resistant     TRIMETH/SULFA <=20 SENSITIVE Sensitive     * >=100,000 COLONIES/mL SERRATIA MARCESCENS    Radiology Studies: No results found.  Scheduled Meds: . apixaban  5 mg Oral BID  . Chlorhexidine Gluconate Cloth  6 each Topical Q0600  . darbepoetin (ARANESP) injection - NON-DIALYSIS  60 mcg Subcutaneous Q Mon-1800  . feeding supplement (PRO-STAT  SUGAR FREE 64)  30 mL Oral BID  . insulin aspart  0-15 Units Subcutaneous TID WC  . insulin aspart  0-5 Units Subcutaneous QHS  . insulin aspart  15 Units Subcutaneous TID WC  . insulin detemir  20 Units Subcutaneous Q24H  . mirtazapine  7.5 mg Oral QHS  . nutrition supplement (JUVEN)  1 packet Oral BID BM  . pantoprazole  40 mg Oral QHS   Continuous Infusions: . sodium chloride 10 mL/hr at 12/15/18 1400  . cefTRIAXone (ROCEPHIN)  IV Stopped (01/07/19 2230)     LOS: 31 days    Bonnell Public, MD [facility]  01/08/2019, 9:52 AM

## 2019-01-08 NOTE — Progress Notes (Signed)
Physical Therapy Treatment Patient Details Name: Timothy Madden MRN: [id number] DOB: 1960-06-13 Today's Date: 01/08/2019    History of Present Illness This is a 58 year old man with a history of numerous psychiatric issues, diabetes poorly controlled supposed to be on home insulin presenting with obtundation from home found to be in severe DKA/HOCM.  PMH DKA, DM, L second finger amputation.    PT Comments    Pt admitted for above. Pt continues to require supervision for bed mobility and min guard for transfers for safety. He continues to demonstrate good hand placement with RW. Pt with slow and steady gait using RW. Pt able to complete some dynamic gait activities such as horizontal and vertical head turns during gait training without LOB or unsteadiness. Pt's gait speed continues to be slow, so did not feel it was safe to assess change in gait speed. Pt tolerated therapeutic exercises well. Pt would benefit from continued skilled PT intervention in order to address deficits.   Follow Up Recommendations  SNF     Equipment Recommendations  Rolling walker with 5" wheels    Recommendations for Other Services       Precautions / Restrictions Precautions Precautions: Fall Restrictions Weight Bearing Restrictions: No    Mobility  Bed Mobility Overal bed mobility: Needs Assistance Bed Mobility: Supine to Sit     Supine to sit: Supervision     General bed mobility comments: supervision for safety  Transfers Overall transfer level: Needs assistance Equipment used: Rolling walker (2 wheeled) Transfers: Sit to/from Stand Sit to Stand: Min guard         General transfer comment: min guard for safety; pt with good hand placement without cueing  Ambulation/Gait Ambulation/Gait assistance: Min guard Gait Distance (Feet): 275 Feet Assistive device: Rolling walker (2 wheeled) Gait Pattern/deviations: Decreased stride length;Step-through pattern Gait velocity: decreased    General Gait Details: slow and steady gait pattern, increased reliance on RW for balance, min guard for safety, slow gait speed and cadence; pt able to complete vertical and horizontal head turns during ambulation without LOB or unsteadiness   Stairs             Wheelchair Mobility    Modified Rankin (Stroke Patients Only)       Balance Overall balance assessment: Needs assistance Sitting-balance support: No upper extremity supported;Feet unsupported Sitting balance-Leahy Scale: Good Sitting balance - Comments: steady sitting EOB and during LE therex   Standing balance support: Bilateral upper extremity supported;During functional activity;Single extremity supported Standing balance-Leahy Scale: Fair Standing balance comment: reliant on BUE from Rw for ambulation, but stood with one hand on RW for static balance                            Cognition Arousal/Alertness: Awake/alert Behavior During Therapy: Flat affect Overall Cognitive Status: No family/caregiver present to determine baseline cognitive functioning Area of Impairment: Safety/judgement                   Current Attention Level: Focused     Safety/Judgement: Decreased awareness of safety;Decreased awareness of deficits            Exercises General Exercises - Lower Extremity Ankle Circles/Pumps: AROM;10 reps;Supine;Both Long Arc Quad: AROM;Both;10 reps;Seated    General Comments        Pertinent Vitals/Pain Pain Assessment: No/denies pain    Home Living  Prior Function            PT Goals (current goals can now be found in the care plan section) Acute Rehab PT Goals PT Goal Formulation: With patient Time For Goal Achievement: 01/07/19 Potential to Achieve Goals: Fair Progress towards PT goals: Progressing toward goals    Frequency    Min 2X/week      PT Plan Current plan remains appropriate    Co-evaluation               AM-PAC PT "6 Clicks" Mobility   Outcome Measure  Help needed turning from your back to your side while in a flat bed without using bedrails?: A Little Help needed moving from lying on your back to sitting on the side of a flat bed without using bedrails?: A Little Help needed moving to and from a bed to a chair (including a wheelchair)?: A Little Help needed standing up from a chair using your arms (e.g., wheelchair or bedside chair)?: A Little Help needed to walk in hospital room?: A Little Help needed climbing 3-5 steps with a railing? : A Lot 6 Click Score: 17    End of Session Equipment Utilized During Treatment: Gait belt Activity Tolerance: Patient tolerated treatment well Patient left: in chair;with call bell/phone within reach;with chair alarm set Nurse Communication: Mobility status PT Visit Diagnosis: Other abnormalities of gait and mobility (R26.89);Muscle weakness (generalized) (M62.81);Other symptoms and signs involving the nervous system (R29.898)     Time: 1002-1020 PT Time Calculation (min) (ACUTE ONLY): 18 min  Charges:  $Gait Training: 8-22 mins                     Christel Mormon, SPT    Starlee Corralejo 01/08/2019, 10:47 AM

## 2019-01-08 NOTE — TOC Progression Note (Signed)
Transition of Care [facility]) - Progression Note    Patient Details  Name: Timothy Madden MRN: [id number] Date of Birth: 10-25-1960  Transition of Care [facility]) CM/SW [place], [place] Phone Number: 01/08/2019, 4:55 PM  Clinical Narrative:    CSW spoke with [facility] Director Nathaniel Man regarding case barriers including Medicaid Pending, unsafe home, and multiple medical care needs that require almost daily care (DM, foley, anticoagulant etc). CSW and TOC Director called Nutritional therapist at [facility] to brainstorm community resources available for pt/pt family. Darryl states that pt had an open case in November but then they were unable to establish contact. He recommended we call Angie Polito at [facility] to discuss potential supports and to see if APS involvement could be re-established. Message left for Angie at [phone].   Message also securely sent to CSW coverage Shanita in case call back received.    Expected Discharge Plan: [place] Barriers to Discharge: Continued Medical Work up, No SNF bed, Inadequate or no insurance, SNF Pending Medicaid, SNF Pending payor source - LOG  Expected Discharge Plan and Services Expected Discharge Plan: [place] In-house Referral: Clinical Social Work Discharge Planning Services: CM Consult, [facility], Medication Assistance Post Acute Care Choice: [place] arrangements for the past 2 months: Single Family Home  Readmission Risk Interventions Readmission Risk Prevention Plan 11/14/2018  Transportation Screening Complete  PCP or Specialist Appt within 3-5 Days Not Complete  Not Complete comments Clinics to which patient goes do not make appointments-it is walk in.  [place] or Home Care Consult Complete  Social Work Consult for [place] Planning/Counseling Complete  Palliative Care Screening Not Applicable  Medication Review (RN Care Manager) Referral to  Pharmacy  Some recent data might be hidden

## 2019-01-08 NOTE — Progress Notes (Signed)
Nutrition Follow-up  DOCUMENTATION CODES:   Underweight, Severe malnutrition in context of chronic illness  INTERVENTION:   -Discontinue Juven -Continue 30 ml Prostat BID, each supplement provides 100 kcals and 15 grams protein -Continue double protein portions with meals -Continue MVI with minerals daily  NUTRITION DIAGNOSIS:   Severe Malnutrition related to chronic illness(uncontrolled DM, suspected gastroparesis) as evidenced by severe muscle depletion, severe fat depletion.  Ongoing  GOAL:   Patient will meet greater than or equal to 90% of their needs  Progressing  MONITOR:   PO intake, Supplement acceptance, Diet advancement  REASON FOR ASSESSMENT:   Consult Assessment of nutrition requirement/status  ASSESSMENT:   58 yo male admitted with AMS r/t severe DKA/HHS. PMH includes poorly controlled DM, severe PCM, gastroparesis, numerous psychiatric issues.  12/2- advanced to dysphagia 1 diet with thin liquids 12/4- advanced to dysphagia 2 diet with thin liquids 12/10- advanced to dysphagia 3 diet with carb mod restriction and thin liquids 12/28- diet downgraded to renal/ carb modified diet with 1.2 l fluid restriction  Reviewed I/O's: -353 ml x 24 hours and -26.8 L since 12/25/18  UOP: 2.5 L x 24 hours  Pt remains with good appetite and is taking supplements well per MAR. Noted meal completion 50-100%.   Per MD, pt remains stable for discharge, but seeking most appropriate discharge disposition (SNF)  Labs reviewed: CBGS: 102-123 (inpatient orders for glycemic control are 0-15 units insulin aspart TID with meals, 0-5 units insulin aspart q HS, 15 units insulin aspart TID with meals, and 20 units insulin detemir every 24 hours).   Diet Order:   Diet Order            Diet renal/carb modified with fluid restriction Diet-HS Snack? Nothing; Fluid restriction: 1200 mL Fluid; Room service appropriate? Yes; Fluid consistency: Thin  Diet effective now               EDUCATION NEEDS:   Education needs have been addressed  Skin:  Skin Assessment: Skin Integrity Issues: Skin Integrity Issues:: Diabetic Ulcer, Stage II Stage II: lt and rt sacrum Diabetic Ulcer: lt great toe  Last BM:  01/07/19  Height:   Ht Readings from Last 1 Encounters:  12/08/18 6\' 3"  (1.905 m)    Weight:   Wt Readings from Last 1 Encounters:  12/09/18 50 kg    Ideal Body Weight:  89.1 kg  BMI:  Body mass index is 13.78 kg/m.  Estimated Nutritional Needs:   Kcal:  2000-2200  Protein:  110-125 grams  Fluid:  > 2 L    Leila Schuff A. Jimmye Norman, RD, LDN, [place] Registered Dietitian II Certified Diabetes Care and Education Specialist Pager: [phone] After hours Pager: [phone]

## 2019-01-09 LAB — URINALYSIS, ROUTINE W REFLEX MICROSCOPIC
Bilirubin Urine: NEGATIVE
Glucose, UA: NEGATIVE mg/dL
Ketones, ur: NEGATIVE mg/dL
Nitrite: NEGATIVE
Protein, ur: 30 mg/dL — AB
Specific Gravity, Urine: 1.011 (ref 1.005–1.030)
WBC, UA: >50 WBC/hpf — ABNORMAL HIGH (ref 0–5)
pH: 6 (ref 5.0–8.0)

## 2019-01-09 LAB — CBC WITH DIFFERENTIAL/PLATELET
Abs Immature Granulocytes: 0.36 10*3/uL — ABNORMAL HIGH (ref 0.00–0.07)
Basophils Absolute: 0.1 10*3/uL (ref 0.0–0.1)
Basophils Relative: 1 %
Eosinophils Absolute: 0.1 10*3/uL (ref 0.0–0.5)
Eosinophils Relative: 1 %
HCT: 31.3 % — ABNORMAL LOW (ref 39.0–52.0)
Hemoglobin: 9.8 g/dL — ABNORMAL LOW (ref 13.0–17.0)
Immature Granulocytes: 3 %
Lymphocytes Relative: 19 %
Lymphs Abs: 2.5 10*3/uL (ref 0.7–4.0)
MCH: 26.8 pg (ref 26.0–34.0)
MCHC: 31.3 g/dL (ref 30.0–36.0)
MCV: 85.8 fL (ref 80.0–100.0)
Monocytes Absolute: 1.5 10*3/uL — ABNORMAL HIGH (ref 0.1–1.0)
Monocytes Relative: 12 %
Neutro Abs: 8.5 10*3/uL — ABNORMAL HIGH (ref 1.7–7.7)
Neutrophils Relative %: 64 %
Platelets: 630 10*3/uL — ABNORMAL HIGH (ref 150–400)
RBC: 3.65 MIL/uL — ABNORMAL LOW (ref 4.22–5.81)
RDW: 21.2 % — ABNORMAL HIGH (ref 11.5–15.5)
WBC: 13.2 10*3/uL — ABNORMAL HIGH (ref 4.0–10.5)
nRBC: 0.3 % — ABNORMAL HIGH (ref 0.0–0.2)

## 2019-01-09 LAB — RENAL FUNCTION PANEL
Albumin: 2.7 g/dL — ABNORMAL LOW (ref 3.5–5.0)
Anion gap: 12 (ref 5–15)
BUN: 127 mg/dL — ABNORMAL HIGH (ref 6–20)
CO2: 19 mmol/L — ABNORMAL LOW (ref 22–32)
Calcium: 9 mg/dL (ref 8.9–10.3)
Chloride: 107 mmol/L (ref 98–111)
Creatinine, Ser: 4.42 mg/dL — ABNORMAL HIGH (ref 0.61–1.24)
GFR calc Af Amer: 16 mL/min — ABNORMAL LOW (ref >60)
GFR calc non Af Amer: 14 mL/min — ABNORMAL LOW (ref >60)
Glucose, Bld: 143 mg/dL — ABNORMAL HIGH (ref 70–99)
Phosphorus: 5.8 mg/dL — ABNORMAL HIGH (ref 2.5–4.6)
Potassium: 4.3 mmol/L (ref 3.5–5.1)
Sodium: 138 mmol/L (ref 135–145)

## 2019-01-09 LAB — GLUCOSE, CAPILLARY
Glucose-Capillary: 185 mg/dL — ABNORMAL HIGH (ref 70–99)
Glucose-Capillary: 143 mg/dL — ABNORMAL HIGH (ref 70–99)
Glucose-Capillary: 84 mg/dL (ref 70–99)
Glucose-Capillary: 52 mg/dL — ABNORMAL LOW (ref 70–99)
Glucose-Capillary: 110 mg/dL — ABNORMAL HIGH (ref 70–99)

## 2019-01-09 LAB — OCCULT BLOOD X 1 CARD TO LAB, STOOL: Fecal Occult Bld: POSITIVE — AB

## 2019-01-09 LAB — MAGNESIUM: Magnesium: 2.1 mg/dL (ref 1.7–2.4)

## 2019-01-09 NOTE — Progress Notes (Signed)
PROGRESS NOTE    Timothy Madden  VXB:[id number] DOB: [birth year] DOA: 12/08/2018 PCP: Marliss Coots, NP   Brief Narrative:  Patient is a 58 year old male prior history of type 2 diabetes, stage III CKD, osteomyelitis, DVT, homelessness presents with DKA.  He was initially admitted to ICU for DKA with obtundation and acute kidney injury.  Patient was transferred to [facility] service after DKA was resolved.  Patient has had significantly elevated platelet count, but now improving.  Hematology/oncology team has advised monitoring the platelet count closely and recommended to follow-up with CBC in 1 to 2 weeks.  At baseline, patient has CKD 3B/4.  AKI has improved significantly.  Patient may have a new baseline renal function.  Continue to monitor closely.  Repeat ultrasound of the renal shows stable hydronephrosis and chronic cystitis.  Patient is awaiting disposition.  01/08/2019: Patient seen.  No new changes.  No new complaints from the patient.  Patient remains stable for discharge. 01/09/2019: Patient seen.  No new changes.  Updated patient's sister.  Significantly elevated BUN, but none new.  Fecal occult blood came back positive.  Hemoglobin is stable.  Serum creatinine has remained relatively stable.  Patient is awaiting disposition.  Assessment & Plan:   Active Problems:   Type 2 diabetes mellitus with stage 3 chronic kidney disease (HCC)   Sepsis ([place])   Essential hypertension   Acute renal failure superimposed on chronic kidney disease (HCC)   DKA, type 1, not at goal [facility])   Protein-calorie malnutrition, severe   Acute lower UTI   Bacteremia due to Gram-negative bacteria   Bilateral hydronephrosis   DVT (deep venous thrombosis) (HCC)   Metabolic acidosis   Anemia due to chronic kidney disease   Pressure injury of skin   Hyperkalemia   Hyperglycemia   Pyelonephritis   Diabetes mellitus with severe DKA: -Resolved. -Hemoglobin A1c was 14.3 on admission. -Blood sugar is  currently controlled with subcutaneous Levemir 20 units once daily, subcutaneous NovoLog 15 units with meals 3 times daily and sliding scale insulin coverage.  CBG (last 3)  Recent Labs    01/09/19 0715 01/09/19 1220 01/09/19 1627  GLUCAP 185* 143* 84   History of Serratia bacteremia and UTI on admission Completed a course of antibiotics already but  though Patient is afebrile his WBC count started going up and his repeat   Urine cultures are growing greater than 100,000 serratia again? Chronic cystitis, unclear If its actual infection or colonization. Recommend another course of IV rocephin for 5 days and stop.  01/08/2019: Repeat urinalysis and urine culture.  Complete course of IV antibiotics for UTI.  Essential hypertension Well controlled.   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  Repeat ultrasound of the kidneys shows stable hydronephrosis but with chronic cystitis.  Continue to follow-up. 01/08/2019: Acute kidney injury has resolved significantly.  Patient may have a new baseline serum creatinine of around 4's.  Continue to monitor closely.  Repeat renal function in the morning.  Bilateral hydronephrosis with ectopic ureter Continue with Foley catheter , it was replaced on 12/18.  Recommend outpatient follow-up with urology.  Anemia of chronic disease Probably secondary to CKD Transfuse to keep hemoglobin greater than 7.  Currently on Aranesp twice weekly. Iron level still low at 38.   Hemoglobin at 9.7 No signs of bleeding.  01/08/2019: Repeat CBC in the morning.  Thrombocytosis:  Probably secondary to iron deficiency/reactive.  Discussed with Dr.  Mohamed (hematology and oncology) recommended 2 doses of IV iron and repeat CBC to check if platelets are improving.  Platelet count greater than 1 million.  Hematology consulted suggested probably reactive from acute illness.  Recommend repeating CBC in 1 to 2  weeks to see if it is improving. CBC today shows platelet count of 861000, improving.  Patient continues to be asymptomatic. 01/08/2019: Thrombocytosis seems to be improving.  Severe protein calorie malnutrition Patient on Remeron for appetite stimulation. Appetite is good, no complaints, advanced diet to renal /carb modified diet.  01/08/2019: Continue to monitor closely.  Pressure ulcers on admission Stage II sacral pressure ulcer on admission Pressure Injury 06/28/18 Foot Right black thicken area on ball of foot (Active)  06/28/18 1732  Location: Foot (ball of right foot)  Location Orientation: Right  Staging:   Wound Description (Comments): black thicken area on ball of foot  Present on Admission: Yes     Pressure Injury 12/13/18 Sacrum Left Stage II -  Partial thickness loss of dermis presenting as a shallow open ulcer with a red, pink wound bed without slough. (Active)  12/13/18 0800  Location: Sacrum  Location Orientation: Left  Staging: Stage II -  Partial thickness loss of dermis presenting as a shallow open ulcer with a red, pink wound bed without slough.  Wound Description (Comments):   Present on Admission:      Pressure Injury 12/13/18 Sacrum Right Stage II -  Partial thickness loss of dermis presenting as a shallow open ulcer with a red, pink wound bed without slough. (Active)  12/13/18 0800  Location: Sacrum  Location Orientation: Right  Staging: Stage II -  Partial thickness loss of dermis presenting as a shallow open ulcer with a red, pink wound bed without slough.  Wound Description (Comments):   Present on Admission:    DVT prophylaxis: Eliquis Code Status: Full code Family Communication: None at bedside Disposition Plan: SNF placement when bed is available.  Patient is not safe to discharge home   Consultants:   Nephrology and urology  Procedures: (None Antimicrobials:  Anti-infectives (From admission, onward)   Start     Dose/Rate Route Frequency  Ordered Stop   01/04/19 2200  cefTRIAXone (ROCEPHIN) 1 g in sodium chloride 0.9 % 100 mL IVPB  Status:  Discontinued     1 g 200 mL/hr over 30 Minutes Intravenous Every 24 hours 01/03/19 1213 01/09/19 0908   12/20/18 1600  piperacillin-tazobactam (ZOSYN) IVPB 3.375 g  Status:  Discontinued     3.375 g 12.5 mL/hr over 240 Minutes Intravenous Every 12 hours 12/20/18 1429 12/27/18 1053   12/19/18 1445  amoxicillin-clavulanate (AUGMENTIN) 500-125 MG per tablet 500 mg  Status:  Discontinued     1 tablet Oral Daily 12/19/18 1432 12/20/18 1424   12/19/18 1430  amoxicillin-clavulanate (AUGMENTIN) 875-125 MG per tablet 1 tablet  Status:  Discontinued     1 tablet Oral Every 12 hours 12/19/18 1427 12/19/18 1431   12/09/18 1200  ceFEPIme (MAXIPIME) 1 g in sodium chloride 0.9 % 100 mL IVPB  Status:  Discontinued     1 g 200 mL/hr over 30 Minutes Intravenous Every 24 hours 12/08/18 1452 12/09/18 0752   12/09/18 0900  cefTRIAXone (ROCEPHIN) 2 g in sodium chloride 0.9 % 100 mL IVPB  Status:  Discontinued     2 g 200 mL/hr over 30 Minutes Intravenous Every 24 hours 12/09/18 0752 12/14/18 1302   12/08/18 1600  azithromycin (ZITHROMAX) 500 mg in sodium chloride 0.9 %  250 mL IVPB  Status:  Discontinued     500 mg 250 mL/hr over 60 Minutes Intravenous Every 24 hours 12/08/18 1508 12/09/18 0752   12/08/18 1452  vancomycin variable dose per unstable renal function (pharmacist dosing)  Status:  Discontinued      Does not apply See admin instructions 12/08/18 1452 12/09/18 0752   12/08/18 1100  vancomycin (VANCOCIN) 2,000 mg in sodium chloride 0.9 % 500 mL IVPB     2,000 mg 250 mL/hr over 120 Minutes Intravenous  Once 12/08/18 1050 12/08/18 1433   12/08/18 1045  ceFEPIme (MAXIPIME) 2 g in sodium chloride 0.9 % 100 mL IVPB     2 g 200 mL/hr over 30 Minutes Intravenous  Once 12/08/18 1031 12/08/18 1241   12/08/18 1045  metroNIDAZOLE (FLAGYL) IVPB 500 mg     500 mg 100 mL/hr over 60 Minutes Intravenous  Once  12/08/18 1031 12/08/18 1326   12/08/18 1045  vancomycin (VANCOCIN) IVPB 1000 mg/200 mL premix  Status:  Discontinued     1,000 mg 200 mL/hr over 60 Minutes Intravenous  Once 12/08/18 1031 12/08/18 1035      Subjective: No new complaints No fever or chills No shortness of breath Patient reports being stable.  Objective: Vitals:   01/08/19 1557 01/08/19 2059 01/09/19 0413 01/09/19 1630  BP: 106/68 108/62 104/75 101/73  Pulse: (!) 109 (!) 108 (!) 103 99  Resp: 16 16 15 18   Temp: 97.8 F (36.6 C) 98.4 F (36.9 C) 98.2 F (36.8 C) 97.9 F (36.6 C)  TempSrc: Oral Oral Oral Oral  SpO2: 99% 100% 100% 100%  Weight:      Height:        Intake/Output Summary (Last 24 hours) at 01/09/2019 1935 Last data filed at 01/09/2019 1800 Gross per 24 hour  Intake 1340 ml  Output 2700 ml  Net -1360 ml   Filed Weights   12/08/18 1200 12/08/18 2050 12/09/18 0300  Weight: 86.9 kg 60.8 kg 50 kg    Examination:  General exam: Alert and comfortable. Patient is cachectic. Respiratory system: Clear to auscultation Cardiovascular system: S1-S2  Gastrointestinal system: Abdomen is soft, nontender, nondistended, bowel sounds normal Central nervous system patient is alert and oriented, no focal deficits Extremities: No pedal edema  Data Reviewed: I have personally reviewed following labs and imaging studies  CBC: Recent Labs  Lab 01/03/19 1227 01/06/19 0402 01/09/19 0330  WBC 15.0* 15.9* 13.2*  NEUTROABS  --   --  8.5*  HGB 9.4* 9.7* 9.8*  HCT 31.2* 31.9* 31.3*  MCV 87.4 87.6 85.8  PLT 995* 861* 841*   Basic Metabolic Panel: Recent Labs  Lab 01/03/19 1227 01/06/19 0402 01/09/19 0330  NA 134* 136 138  K 4.6 4.6 4.3  CL 106 105 107  CO2 18* 21* 19*  GLUCOSE 345* 191* 143*  BUN 130* 136* 127*  CREATININE 4.56* 4.32* 4.42*  CALCIUM 9.0 9.3 9.0  MG  --   --  2.1  PHOS  --   --  5.8*   GFR: Estimated Creatinine Clearance: 13 mL/min (A) (by C-G formula based on SCr of 4.42  mg/dL (H)). Liver Function Tests: Recent Labs  Lab 01/09/19 0330  ALBUMIN 2.7*   No results for input(s): LIPASE, AMYLASE in the last 168 hours. No results for input(s): AMMONIA in the last 168 hours. Coagulation Profile: No results for input(s): INR, PROTIME in the last 168 hours. Cardiac Enzymes: No results for input(s): CKTOTAL, CKMB, CKMBINDEX, TROPONINI in the  last 168 hours. BNP (last 3 results) No results for input(s): PROBNP in the last 8760 hours. HbA1C: No results for input(s): HGBA1C in the last 72 hours. CBG: Recent Labs  Lab 01/08/19 1717 01/08/19 2109 01/09/19 0715 01/09/19 1220 01/09/19 1627  GLUCAP 215* 79 185* 143* 84   Lipid Profile: No results for input(s): CHOL, HDL, LDLCALC, TRIG, CHOLHDL, LDLDIRECT in the last 72 hours. Thyroid Function Tests: No results for input(s): TSH, T4TOTAL, FREET4, T3FREE, THYROIDAB in the last 72 hours. Anemia Panel: No results for input(s): VITAMINB12, FOLATE, FERRITIN, TIBC, IRON, RETICCTPCT in the last 72 hours. Sepsis Labs: No results for input(s): PROCALCITON, LATICACIDVEN in the last 168 hours.  Recent Results (from the past 240 hour(s))  Culture, Urine     Status: Abnormal   Collection Time: 01/03/19  6:03 PM   Specimen: Urine, Catheterized  Result Value Ref Range Status   Specimen Description URINE, CATHETERIZED  Final   Special Requests   Final    NONE Performed at [facility], [address] [place], [place] [address]    Culture >=100,000 COLONIES/mL SERRATIA MARCESCENS (A)  Final   Report Status 01/06/2019 FINAL  Final   Organism ID, Bacteria SERRATIA MARCESCENS (A)  Final      Susceptibility   Serratia marcescens - MIC*    CEFAZOLIN >=64 RESISTANT Resistant     CEFTRIAXONE <=1 SENSITIVE Sensitive     CIPROFLOXACIN <=0.25 SENSITIVE Sensitive     GENTAMICIN <=1 SENSITIVE Sensitive     NITROFURANTOIN 256 RESISTANT Resistant     TRIMETH/SULFA <=20 SENSITIVE Sensitive     * >=100,000 COLONIES/mL  SERRATIA MARCESCENS    Radiology Studies: No results found.  Scheduled Meds: . apixaban  5 mg Oral BID  . Chlorhexidine Gluconate Cloth  6 each Topical Q0600  . darbepoetin (ARANESP) injection - NON-DIALYSIS  60 mcg Subcutaneous Q Mon-1800  . feeding supplement (PRO-STAT SUGAR FREE 64)  30 mL Oral BID  . insulin aspart  0-15 Units Subcutaneous TID WC  . insulin aspart  0-5 Units Subcutaneous QHS  . insulin aspart  15 Units Subcutaneous TID WC  . insulin detemir  20 Units Subcutaneous Q24H  . mirtazapine  7.5 mg Oral QHS  . pantoprazole  40 mg Oral QHS   Continuous Infusions: . sodium chloride 10 mL/hr at 12/15/18 1400     LOS: 42 days    Bonnell Public, MD [facility]  01/09/2019, 7:35 PM

## 2019-01-09 NOTE — Progress Notes (Signed)
Hypoglycemic Event  CBG: 52  Treatment: 4 oz juice/soda  Symptoms: None  Follow-up CBG: Time:2211 CBG Result:110  Possible Reasons for Event: Inadequate meal intake  Comments/MD notified:Notified on call [name] via text page    Timothy Madden

## 2019-01-09 NOTE — Plan of Care (Signed)

## 2019-01-10 LAB — GLUCOSE, CAPILLARY
Glucose-Capillary: 150 mg/dL — ABNORMAL HIGH (ref 70–99)
Glucose-Capillary: 83 mg/dL (ref 70–99)
Glucose-Capillary: 267 mg/dL — ABNORMAL HIGH (ref 70–99)
Glucose-Capillary: 117 mg/dL — ABNORMAL HIGH (ref 70–99)

## 2019-01-10 NOTE — Progress Notes (Signed)
Inpatient Diabetes Program Recommendations  AACE/ADA: New Consensus Statement on Inpatient Glycemic Control (2015)  Target Ranges:  Prepandial:   less than 140 mg/dL      Peak postprandial:   less than 180 mg/dL (1-2 hours)      Critically ill patients:  140 - 180 mg/dL   Lab Results  Component Value Date   GLUCAP 150 (H) 01/10/2019   HGBA1C 14.3 (H) 11/10/2018    Review of Glycemic Control Results for Timothy Madden, Timothy Madden (MRN [id number]) as of 01/10/2019 11:14  Ref. Range 01/09/2019 12:20 01/09/2019 16:27 01/09/2019 20:52 01/09/2019 22:11 01/10/2019 07:29  Glucose-Capillary Latest Ref Range: 70 - 99 mg/dL 143 (H) 84 52 (L) 110 (H) 150 (H)   Inpatient Diabetes Program Recommendations:  -Decrease Novolog meal coverage to 10 tid if eats 50% -Decrease Novolog correction to sensitive tid + hs 0-5 Attempted to call patient room (DM coordinator working @ [employer] today) without answer and secure chat to [employer]. Recommendations sent via secure chat to Dr. Marthenia Rolling.  Thank you, Nani Gasser. [place] Kuang, RN, MSN, CDE  Diabetes Coordinator Inpatient Glycemic Control Team Team Pager [phone] (8am-5pm) 01/10/2019 11:15 AM

## 2019-01-10 NOTE — Progress Notes (Signed)
Physical Therapy Treatment Patient Details Name: Timothy Madden MRN: [id number] DOB: February 23, 1960 Today's Date: 01/10/2019    History of Present Illness This is a 58 year old man with a history of numerous psychiatric issues, diabetes poorly controlled supposed to be on home insulin presenting with obtundation from home found to be in severe DKA/HOCM.  PMH DKA, DM, L second finger amputation.    PT Comments    Pt fatigued from an earlier walk with nursing, but still agreeable for ambulation. Facilitated obstacle negotiation and head turns to decrease fall risk. Con't to recommend SNF.   Follow Up Recommendations  SNF     Equipment Recommendations  Rolling walker with 5" wheels    Recommendations for Other Services       Precautions / Restrictions Precautions Precautions: Fall Restrictions Weight Bearing Restrictions: No    Mobility  Bed Mobility               General bed mobility comments: up in recliner upon arrival  Transfers Overall transfer level: Needs assistance Equipment used: Rolling walker (2 wheeled) Transfers: Sit to/from Stand Sit to Stand: Min guard         General transfer comment: min/guard for safety. good use of UE  Ambulation/Gait Ambulation/Gait assistance: Min guard Gait Distance (Feet): 200 Feet Assistive device: Rolling walker (2 wheeled) Gait Pattern/deviations: Decreased step length - left;Step-through pattern Gait velocity: decreased   General Gait Details: Worked on head turns and obstacle negotiation to decrease fall risk.   Stairs             Wheelchair Mobility    Modified Rankin (Stroke Patients Only)       Balance           Standing balance support: Bilateral upper extremity supported;Single extremity supported Standing balance-Leahy Scale: Fair                              Cognition Arousal/Alertness: Awake/alert Behavior During Therapy: Flat affect Overall Cognitive Status: No  family/caregiver present to determine baseline cognitive functioning Area of Impairment: Safety/judgement                     Memory: Decreased short-term memory Following Commands: Follows one step commands consistently;Follows one step commands with increased time Safety/Judgement: Decreased awareness of safety;Decreased awareness of deficits   Problem Solving: Slow processing;Decreased initiation;Difficulty sequencing;Requires verbal cues;Requires tactile cues General Comments: Answers "yes" to almost everything. Did answer a few questions with multi- word responses.      Exercises      General Comments        Pertinent Vitals/Pain Pain Assessment: No/denies pain    Home Living                      Prior Function            PT Goals (current goals can now be found in the care plan section) Acute Rehab PT Goals PT Goal Formulation: With patient Time For Goal Achievement: 01/24/19 Potential to Achieve Goals: Fair Progress towards PT goals: Progressing toward goals    Frequency    Min 2X/week      PT Plan Current plan remains appropriate    Co-evaluation              AM-PAC PT "6 Clicks" Mobility   Outcome Measure  Help needed turning from your back to your side while in a flat  bed without using bedrails?: A Little Help needed moving from lying on your back to sitting on the side of a flat bed without using bedrails?: A Little Help needed moving to and from a bed to a chair (including a wheelchair)?: A Little Help needed standing up from a chair using your arms (e.g., wheelchair or bedside chair)?: A Little Help needed to walk in hospital room?: A Little Help needed climbing 3-5 steps with a railing? : A Lot 6 Click Score: 17    End of Session Equipment Utilized During Treatment: Gait belt Activity Tolerance: Patient tolerated treatment well Patient left: in chair;with call bell/phone within reach;with chair alarm set Nurse  Communication: Mobility status PT Visit Diagnosis: Other abnormalities of gait and mobility (R26.89);Muscle weakness (generalized) (M62.81);Other symptoms and signs involving the nervous system (N02.725)     Time: 3664-4034 PT Time Calculation (min) (ACUTE ONLY): 18 min  Charges:  $Gait Training: 8-22 mins                     [name] L. Tamala Julian, [place] Pager [phone] 01/10/2019    [name] 01/10/2019, 2:35 PM

## 2019-01-10 NOTE — Progress Notes (Signed)
PROGRESS NOTE    Timothy Madden  ULA:[id number] DOB: Aug 30, 1960 DOA: 12/08/2018 PCP: Marliss Coots, NP   Brief Narrative:  Patient is a 58 year old male prior history of type 2 diabetes, stage III CKD, osteomyelitis, DVT, homelessness presents with DKA.  He was initially admitted to ICU for DKA with obtundation and acute kidney injury.  Patient was transferred to [facility] service after DKA was resolved.  Patient has had significantly elevated platelet count, but now improving.  Hematology/oncology team has advised monitoring the platelet count closely and recommended to follow-up with CBC in 1 to 2 weeks.  At baseline, patient has CKD 3B/4.  AKI has improved significantly.  Patient may have a new baseline renal function.  Continue to monitor closely.  Repeat ultrasound of the renal shows stable hydronephrosis and chronic cystitis.  Patient is awaiting disposition.  01/08/2019: Patient seen.  No new changes.  No new complaints from the patient.  Patient remains stable for discharge. 01/09/2019: Patient seen.  No new changes.  Updated patient's sister.  Significantly elevated BUN, but none new.  Fecal occult blood came back positive.  Hemoglobin is stable.  Serum creatinine has remained relatively stable.  Patient is awaiting disposition. 01/10/2019: No new changes.  Pursue disposition.  Assessment & Plan:   Active Problems:   Type 2 diabetes mellitus with stage 3 chronic kidney disease (HCC)   Sepsis ([place])   Essential hypertension   Acute renal failure superimposed on chronic kidney disease (HCC)   DKA, type 1, not at goal [facility])   Protein-calorie malnutrition, severe   Acute lower UTI   Bacteremia due to Gram-negative bacteria   Bilateral hydronephrosis   DVT (deep venous thrombosis) (HCC)   Metabolic acidosis   Anemia due to chronic kidney disease   Pressure injury of skin   Hyperkalemia   Hyperglycemia   Pyelonephritis   Diabetes mellitus with severe DKA: -Resolved. -Hemoglobin  A1c was 14.3 on admission. -Blood sugar is currently controlled with subcutaneous Levemir 20 units once daily, subcutaneous NovoLog 15 units with meals 3 times daily and sliding scale insulin coverage.  CBG (last 3)  Recent Labs    01/10/19 0729 01/10/19 1215 01/10/19 1645  GLUCAP 150* 83 267*   History of Serratia bacteremia and UTI on admission Completed a course of antibiotics already but  though Patient is afebrile his WBC count started going up and his repeat   Urine cultures are growing greater than 100,000 serratia again? Chronic cystitis, unclear If its actual infection or colonization. Recommend another course of IV rocephin for 5 days and stop.  01/08/2019: Repeat urinalysis and urine culture.  Complete course of IV antibiotics for UTI. 01/10/2019: Follow repeat urine cultures.  Essential hypertension Well controlled.   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  Repeat ultrasound of the kidneys shows stable hydronephrosis but with chronic cystitis.  Continue to follow-up. 01/08/2019: Acute kidney injury has resolved significantly.  Patient may have a new baseline serum creatinine of around 4's.  Continue to monitor closely.  Repeat renal function in the morning. 01/10/2019: Repeat renal function in the morning.  Blood pressure arterial consult nephrology team.  Monitor closely for encephalopathy.  Patient is at risk for uremic encephalopathy.  Bilateral hydronephrosis with ectopic ureter Continue with Foley catheter , it was replaced on 12/18.  Recommend outpatient follow-up with urology.  Anemia of chronic disease Probably secondary to CKD Transfuse to keep hemoglobin greater than 7.  Currently on Aranesp twice weekly. Iron level still low at 38.   Hemoglobin at 9.7 No signs of bleeding.  01/08/2019: Repeat CBC in the morning.  Thrombocytosis:  Probably secondary to iron deficiency/reactive.  Discussed  with Dr. Julien Nordmann (hematology and oncology) recommended 2 doses of IV iron and repeat CBC to check if platelets are improving.  Platelet count greater than 1 million.  Hematology consulted suggested probably reactive from acute illness.  Recommend repeating CBC in 1 to 2 weeks to see if it is improving. CBC today shows platelet count of 861000, improving.  Patient continues to be asymptomatic. 01/08/2019: Thrombocytosis seems to be improving.  Severe protein calorie malnutrition Patient on Remeron for appetite stimulation. Appetite is good, no complaints, advanced diet to renal /carb modified diet.  01/08/2019: Continue to monitor closely.  Pressure ulcers on admission Stage II sacral pressure ulcer on admission Pressure Injury 06/28/18 Foot Right black thicken area on ball of foot (Active)  06/28/18 1732  Location: Foot (ball of right foot)  Location Orientation: Right  Staging:   Wound Description (Comments): black thicken area on ball of foot  Present on Admission: Yes     Pressure Injury 12/13/18 Sacrum Left Stage II -  Partial thickness loss of dermis presenting as a shallow open ulcer with a red, pink wound bed without slough. (Active)  12/13/18 0800  Location: Sacrum  Location Orientation: Left  Staging: Stage II -  Partial thickness loss of dermis presenting as a shallow open ulcer with a red, pink wound bed without slough.  Wound Description (Comments):   Present on Admission:      Pressure Injury 12/13/18 Sacrum Right Stage II -  Partial thickness loss of dermis presenting as a shallow open ulcer with a red, pink wound bed without slough. (Active)  12/13/18 0800  Location: Sacrum  Location Orientation: Right  Staging: Stage II -  Partial thickness loss of dermis presenting as a shallow open ulcer with a red, pink wound bed without slough.  Wound Description (Comments):   Present on Admission:    DVT prophylaxis: Eliquis Code Status: Full code Family Communication: None  at bedside Disposition Plan: SNF placement when bed is available.  Patient is not safe to discharge home   Consultants:   Nephrology and urology  Procedures: (None Antimicrobials:  Anti-infectives (From admission, onward)   Start     Dose/Rate Route Frequency Ordered Stop   01/04/19 2200  cefTRIAXone (ROCEPHIN) 1 g in sodium chloride 0.9 % 100 mL IVPB  Status:  Discontinued     1 g 200 mL/hr over 30 Minutes Intravenous Every 24 hours 01/03/19 1213 01/09/19 0908   12/20/18 1600  piperacillin-tazobactam (ZOSYN) IVPB 3.375 g  Status:  Discontinued     3.375 g 12.5 mL/hr over 240 Minutes Intravenous Every 12 hours 12/20/18 1429 12/27/18 1053   12/19/18 1445  amoxicillin-clavulanate (AUGMENTIN) 500-125 MG per tablet 500 mg  Status:  Discontinued     1 tablet Oral Daily 12/19/18 1432 12/20/18 1424   12/19/18 1430  amoxicillin-clavulanate (AUGMENTIN) 875-125 MG per tablet 1 tablet  Status:  Discontinued     1 tablet Oral Every 12 hours 12/19/18 1427 12/19/18 1431   12/09/18 1200  ceFEPIme (MAXIPIME) 1 g in sodium chloride 0.9 % 100 mL IVPB  Status:  Discontinued     1 g 200 mL/hr over 30 Minutes Intravenous Every 24 hours 12/08/18 1452 12/09/18 0752   12/09/18 0900  cefTRIAXone (ROCEPHIN) 2 g in sodium chloride 0.9 %  100 mL IVPB  Status:  Discontinued     2 g 200 mL/hr over 30 Minutes Intravenous Every 24 hours 12/09/18 0752 12/14/18 1302   12/08/18 1600  azithromycin (ZITHROMAX) 500 mg in sodium chloride 0.9 % 250 mL IVPB  Status:  Discontinued     500 mg 250 mL/hr over 60 Minutes Intravenous Every 24 hours 12/08/18 1508 12/09/18 0752   12/08/18 1452  vancomycin variable dose per unstable renal function (pharmacist dosing)  Status:  Discontinued      Does not apply See admin instructions 12/08/18 1452 12/09/18 0752   12/08/18 1100  vancomycin (VANCOCIN) 2,000 mg in sodium chloride 0.9 % 500 mL IVPB     2,000 mg 250 mL/hr over 120 Minutes Intravenous  Once 12/08/18 1050 12/08/18 1433    12/08/18 1045  ceFEPIme (MAXIPIME) 2 g in sodium chloride 0.9 % 100 mL IVPB     2 g 200 mL/hr over 30 Minutes Intravenous  Once 12/08/18 1031 12/08/18 1241   12/08/18 1045  metroNIDAZOLE (FLAGYL) IVPB 500 mg     500 mg 100 mL/hr over 60 Minutes Intravenous  Once 12/08/18 1031 12/08/18 1326   12/08/18 1045  vancomycin (VANCOCIN) IVPB 1000 mg/200 mL premix  Status:  Discontinued     1,000 mg 200 mL/hr over 60 Minutes Intravenous  Once 12/08/18 1031 12/08/18 1035      Subjective: No new complaints No fever or chills No shortness of breath Patient reports being stable.  Objective: Vitals:   01/09/19 1630 01/09/19 2050 01/10/19 0532 01/10/19 1408  BP: 101/73 107/76 105/88 110/70  Pulse: 99 99 97 (!) 108  Resp: 18 16 16    Temp: 97.9 F (36.6 C) 98.4 F (36.9 C) 98.5 F (36.9 C) 98.6 F (37 C)  TempSrc: Oral Oral Oral Oral  SpO2: 100% 100% 100% 100%  Weight:      Height:        Intake/Output Summary (Last 24 hours) at 01/10/2019 1724 Last data filed at 01/10/2019 1500 Gross per 24 hour  Intake 734 ml  Output 2375 ml  Net -1641 ml   Filed Weights   12/08/18 1200 12/08/18 2050 12/09/18 0300  Weight: 86.9 kg 60.8 kg 50 kg    Examination:  General exam: Alert and comfortable. Patient is cachectic. Respiratory system: Clear to auscultation Cardiovascular system: S1-S2  Gastrointestinal system: Abdomen is soft, nontender, nondistended, bowel sounds normal Central nervous system patient is alert and oriented, no focal deficits Extremities: No pedal edema  Data Reviewed: I have personally reviewed following labs and imaging studies  CBC: Recent Labs  Lab 01/06/19 0402 01/09/19 0330  WBC 15.9* 13.2*  NEUTROABS  --  8.5*  HGB 9.7* 9.8*  HCT 31.9* 31.3*  MCV 87.6 85.8  PLT 861* 235*   Basic Metabolic Panel: Recent Labs  Lab 01/06/19 0402 01/09/19 0330  NA 136 138  K 4.6 4.3  CL 105 107  CO2 21* 19*  GLUCOSE 191* 143*  BUN 136* 127*  CREATININE 4.32*  4.42*  CALCIUM 9.3 9.0  MG  --  2.1  PHOS  --  5.8*   GFR: Estimated Creatinine Clearance: 13 mL/min (A) (by C-G formula based on SCr of 4.42 mg/dL (H)). Liver Function Tests: Recent Labs  Lab 01/09/19 0330  ALBUMIN 2.7*   No results for input(s): LIPASE, AMYLASE in the last 168 hours. No results for input(s): AMMONIA in the last 168 hours. Coagulation Profile: No results for input(s): INR, PROTIME in the last 168 hours.  Cardiac Enzymes: No results for input(s): CKTOTAL, CKMB, CKMBINDEX, TROPONINI in the last 168 hours. BNP (last 3 results) No results for input(s): PROBNP in the last 8760 hours. HbA1C: No results for input(s): HGBA1C in the last 72 hours. CBG: Recent Labs  Lab 01/09/19 2052 01/09/19 2211 01/10/19 0729 01/10/19 1215 01/10/19 1645  GLUCAP 52* 110* 150* 83 267*   Lipid Profile: No results for input(s): CHOL, HDL, LDLCALC, TRIG, CHOLHDL, LDLDIRECT in the last 72 hours. Thyroid Function Tests: No results for input(s): TSH, T4TOTAL, FREET4, T3FREE, THYROIDAB in the last 72 hours. Anemia Panel: No results for input(s): VITAMINB12, FOLATE, FERRITIN, TIBC, IRON, RETICCTPCT in the last 72 hours. Sepsis Labs: No results for input(s): PROCALCITON, LATICACIDVEN in the last 168 hours.  Recent Results (from the past 240 hour(s))  Culture, Urine     Status: Abnormal   Collection Time: 01/03/19  6:03 PM   Specimen: Urine, Catheterized  Result Value Ref Range Status   Specimen Description URINE, CATHETERIZED  Final   Special Requests   Final    NONE Performed at [facility], [address], [place], [place] [address]    Culture >=100,000 COLONIES/mL SERRATIA MARCESCENS (A)  Final   Report Status 01/06/2019 FINAL  Final   Organism ID, Bacteria SERRATIA MARCESCENS (A)  Final      Susceptibility   Serratia marcescens - MIC*    CEFAZOLIN >=64 RESISTANT Resistant     CEFTRIAXONE <=1 SENSITIVE Sensitive     CIPROFLOXACIN <=0.25 SENSITIVE Sensitive      GENTAMICIN <=1 SENSITIVE Sensitive     NITROFURANTOIN 256 RESISTANT Resistant     TRIMETH/SULFA <=20 SENSITIVE Sensitive     * >=100,000 COLONIES/mL SERRATIA MARCESCENS  Culture, Urine     Status: Abnormal (Preliminary result)   Collection Time: 01/09/19  1:10 PM   Specimen: Urine, Catheterized  Result Value Ref Range Status   Specimen Description URINE, CATHETERIZED  Final   Special Requests   Final    NONE Performed at [facility], [place] [address] [place], [place] [address]    Culture >=100,000 COLONIES/mL GRAM NEGATIVE RODS (A)  Final   Report Status PENDING  Incomplete    Radiology Studies: No results found.  Scheduled Meds: . apixaban  5 mg Oral BID  . Chlorhexidine Gluconate Cloth  6 each Topical Q0600  . darbepoetin (ARANESP) injection - NON-DIALYSIS  60 mcg Subcutaneous Q Mon-1800  . feeding supplement (PRO-STAT SUGAR FREE 64)  30 mL Oral BID  . insulin aspart  0-15 Units Subcutaneous TID WC  . insulin aspart  0-5 Units Subcutaneous QHS  . insulin aspart  15 Units Subcutaneous TID WC  . insulin detemir  20 Units Subcutaneous Q24H  . mirtazapine  7.5 mg Oral QHS  . pantoprazole  40 mg Oral QHS   Continuous Infusions: . sodium chloride 10 mL/hr at 12/15/18 1400     LOS: 33 days    Bonnell Public, MD [facility]  01/10/2019, 5:24 PM

## 2019-01-10 NOTE — Plan of Care (Signed)
  Problem: Pain Managment: Goal: General experience of comfort will improve Outcome: Progressing   Problem: Safety: Goal: Ability to remain free from injury will improve Outcome: Progressing   Problem: Skin Integrity: Goal: Risk for impaired skin integrity will decrease Outcome: Progressing   

## 2019-01-11 DIAGNOSIS — N178 Other acute kidney failure: Secondary | ICD-10-CM

## 2019-01-11 LAB — CBC WITH DIFFERENTIAL/PLATELET
Abs Immature Granulocytes: 0.27 10*3/uL — ABNORMAL HIGH (ref 0.00–0.07)
Basophils Absolute: 0.2 10*3/uL — ABNORMAL HIGH (ref 0.0–0.1)
Basophils Relative: 1 %
Eosinophils Absolute: 0.1 10*3/uL (ref 0.0–0.5)
Eosinophils Relative: 1 %
HCT: 34 % — ABNORMAL LOW (ref 39.0–52.0)
Hemoglobin: 10.7 g/dL — ABNORMAL LOW (ref 13.0–17.0)
Immature Granulocytes: 2 %
Lymphocytes Relative: 20 %
Lymphs Abs: 2.6 10*3/uL (ref 0.7–4.0)
MCH: 27.3 pg (ref 26.0–34.0)
MCHC: 31.5 g/dL (ref 30.0–36.0)
MCV: 86.7 fL (ref 80.0–100.0)
Monocytes Absolute: 1.5 10*3/uL — ABNORMAL HIGH (ref 0.1–1.0)
Monocytes Relative: 11 %
Neutro Abs: 8.7 10*3/uL — ABNORMAL HIGH (ref 1.7–7.7)
Neutrophils Relative %: 65 %
Platelets: 554 10*3/uL — ABNORMAL HIGH (ref 150–400)
RBC: 3.92 MIL/uL — ABNORMAL LOW (ref 4.22–5.81)
RDW: 21.7 % — ABNORMAL HIGH (ref 11.5–15.5)
WBC: 13.3 10*3/uL — ABNORMAL HIGH (ref 4.0–10.5)
nRBC: 0.2 % (ref 0.0–0.2)

## 2019-01-11 LAB — RENAL FUNCTION PANEL
Albumin: 3 g/dL — ABNORMAL LOW (ref 3.5–5.0)
Anion gap: 11 (ref 5–15)
BUN: 101 mg/dL — ABNORMAL HIGH (ref 6–20)
CO2: 20 mmol/L — ABNORMAL LOW (ref 22–32)
Calcium: 9.3 mg/dL (ref 8.9–10.3)
Chloride: 109 mmol/L (ref 98–111)
Creatinine, Ser: 4.53 mg/dL — ABNORMAL HIGH (ref 0.61–1.24)
GFR calc Af Amer: 16 mL/min — ABNORMAL LOW (ref >60)
GFR calc non Af Amer: 13 mL/min — ABNORMAL LOW (ref >60)
Glucose, Bld: 150 mg/dL — ABNORMAL HIGH (ref 70–99)
Phosphorus: 5.4 mg/dL — ABNORMAL HIGH (ref 2.5–4.6)
Potassium: 4.2 mmol/L (ref 3.5–5.1)
Sodium: 140 mmol/L (ref 135–145)

## 2019-01-11 LAB — GLUCOSE, CAPILLARY
Glucose-Capillary: 268 mg/dL — ABNORMAL HIGH (ref 70–99)
Glucose-Capillary: 102 mg/dL — ABNORMAL HIGH (ref 70–99)
Glucose-Capillary: 237 mg/dL — ABNORMAL HIGH (ref 70–99)
Glucose-Capillary: 140 mg/dL — ABNORMAL HIGH (ref 70–99)

## 2019-01-11 LAB — MAGNESIUM: Magnesium: 2.1 mg/dL (ref 1.7–2.4)

## 2019-01-11 NOTE — Plan of Care (Signed)
  Problem: Pain Managment: Goal: General experience of comfort will improve Outcome: Progressing   Problem: Safety: Goal: Ability to remain free from injury will improve Outcome: Progressing   Problem: Skin Integrity: Goal: Risk for impaired skin integrity will decrease Outcome: Progressing   

## 2019-01-11 NOTE — Progress Notes (Signed)
PROGRESS NOTE    Timothy Madden  WIO:[id number] DOB: 04-07-1960 DOA: 12/08/2018 PCP: Marliss Coots, NP  Brief Narrative:  Patient is a 59 year old male prior history of type 2 diabetes, stage III CKD, osteomyelitis, DVT, homelessness presents with DKA.  He was initially admitted to ICU for DKA with obtundation and acute kidney injury.  Patient was transferred to [facility] service after DKA was resolved.  Patient has had significantly elevated platelet count, but now improving.  Hematology/oncology team has advised monitoring the platelet count closely and recommended to follow-up with CBC in 1 to 2 weeks.  At baseline, patient has CKD 3B/4.  AKI has improved significantly.  Patient may have a new baseline renal function.  Continue to monitor closely.  Repeat ultrasound of the renal shows stable hydronephrosis and chronic cystitis.  Patient is awaiting disposition.  01/08/2019: Patient seen.  No new changes.  No new complaints from the patient.  Patient remains stable for discharge. 01/09/2019: Patient seen.  No new changes.  Updated patient's sister.  Significantly elevated BUN, but none new.  Fecal occult blood came back positive.  Hemoglobin is stable.  Serum creatinine has remained relatively stable.  Patient is awaiting disposition. 01/10/2019: No new changes.  Pursue disposition.   Assessment & Plan:   Active Problems:   Type 2 diabetes mellitus with stage 3 chronic kidney disease (HCC)   Sepsis ([place])   Essential hypertension   Acute renal failure superimposed on chronic kidney disease (HCC)   DKA, type 1, not at goal [facility])   Protein-calorie malnutrition, severe   Acute lower UTI   Bacteremia due to Gram-negative bacteria   Bilateral hydronephrosis   DVT (deep venous thrombosis) (HCC)   Metabolic acidosis   Anemia due to chronic kidney disease   Pressure injury of skin   Hyperkalemia   Hyperglycemia   Pyelonephritis  Diabetes mellitus with severe DKA: -Resolved. -Hemoglobin  A1c was 14.3 on admission. -Blood sugar is currently controlled with subcutaneous Levemir 20 units once daily, subcutaneous NovoLog 15 units with meals 3 times daily and sliding scale insulin coverage.  CBG (last 3)  Recent Labs (last 2 labs)        Recent Labs    01/10/19 0729 01/10/19 1215 01/10/19 1645  GLUCAP 150* 83 267*     History of Serratia bacteremia and UTI on admission Completed a course of antibiotics already but  though Patient is afebrile his WBC count started going up and his repeat   Urine cultures are growing greater than 100,000 serratia again? Chronic cystitis, unclear If its actual infection or colonization. Recommend another course of IV rocephin for 5 days and stop.  01/08/2019: Repeat urinalysis and urine culture.  Complete course of IV antibiotics for UTI. 01/10/2019: Follow repeat urine cultures. 01/11/2019: still + with GNR, has indwelling foley due to obstructive uropathy, changed today--did not restart Abx until ID and sens are back--also with some UPJ dysfunction and h/o stones. Per Urology, leave catheter in place--consider curbside ID for recommendations on how to clear this infection.  Essential hypertension Well controlled.   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  Repeat ultrasound of the kidneys shows stable hydronephrosis but with chronic cystitis.  Continue to follow-up. 01/08/2019: Acute kidney injury has resolved significantly.  Patient may have a new baseline serum creatinine of around 4's.  Continue to monitor closely.  Repeat renal function in the morning. 01/10/2019: Repeat renal function in the morning.  Blood pressure arterial consult nephrology team.  Monitor closely for encephalopathy.  Patient is at risk for uremic encephalopathy. 01/11/19: Cr is stable, BUN is slightly better, K+ ok  Bilateral hydronephrosis with ectopic ureter Continue with Foley catheter , it was  replaced on 12/18.  Recommend outpatient follow-up with urology.  Anemia of chronic disease Probably secondary to CKD Transfuse to keep hemoglobin greater than 7.  Currently on Aranesp twice weekly. Iron level still low at 38.   Hemoglobin at 9.7 No signs of bleeding.  01/08/2019: Repeat CBC in the morning. 01/11/19: Hgb is 10.7 today  Thrombocytosis:  Probably secondary to iron deficiency/reactive.  Discussed with Dr. Julien Nordmann (hematology and oncology) recommended 2 doses of IV iron and repeat CBC to check if platelets are improving.  Platelet count greater than 1 million.  Hematology consulted suggested probably reactive from acute illness.  Recommend repeating CBC in 1 to 2 weeks to see if it is improving. CBC today shows platelet count of 861000, improving.  Patient continues to be asymptomatic. 01/08/2019: Thrombocytosis seems to be improving. 01/11/19: plt down to 554K today  Severe protein calorie malnutrition Patient on Remeron for appetite stimulation. Appetite is good, no complaints, advanced diet to renal /carb modified diet.  01/08/2019: Continue to monitor closely.  Pressure ulcers on admission Stage II sacral pressure ulcer on admission Pressure Injury 06/28/18 Foot Right black thicken area on ball of foot (Active)  06/28/18 1732  Location: Foot (ball of right foot)  Location Orientation: Right  Staging:   Wound Description (Comments): black thicken area on ball of foot  Present on Admission: Yes     Pressure Injury 12/13/18 Sacrum Left Stage II -  Partial thickness loss of dermis presenting as a shallow open ulcer with a red, pink wound bed without slough. (Active)  12/13/18 0800  Location: Sacrum  Location Orientation: Left  Staging: Stage II -  Partial thickness loss of dermis presenting as a shallow open ulcer with a red, pink wound bed without slough.  Wound Description (Comments):   Present on Admission:      Pressure Injury 12/13/18 Sacrum Right  Stage II -  Partial thickness loss of dermis presenting as a shallow open ulcer with a red, pink wound bed without slough. (Active)  12/13/18 0800  Location: Sacrum  Location Orientation: Right  Staging: Stage II -  Partial thickness loss of dermis presenting as a shallow open ulcer with a red, pink wound bed without slough.  Wound Description (Comments):   Present on Admission:       DVT prophylaxis: Eliquis Code Status: Full Family Communication: Disposition Plan: SNF  Consultants:   Nephrology  Urology  Procedures:   None   Antimicrobials:  Anti-infectives (From admission, onward)   Start     Dose/Rate Route Frequency Ordered Stop   01/04/19 2200  cefTRIAXone (ROCEPHIN) 1 g in sodium chloride 0.9 % 100 mL IVPB  Status:  Discontinued     1 g 200 mL/hr over 30 Minutes Intravenous Every 24 hours 01/03/19 1213 01/09/19 0908   12/20/18 1600  piperacillin-tazobactam (ZOSYN) IVPB 3.375 g  Status:  Discontinued     3.375 g 12.5 mL/hr over 240 Minutes Intravenous Every 12 hours 12/20/18 1429 12/27/18 1053   12/19/18 1445  amoxicillin-clavulanate (AUGMENTIN) 500-125 MG per tablet 500 mg  Status:  Discontinued     1 tablet Oral Daily 12/19/18 1432 12/20/18 1424   12/19/18 1430  amoxicillin-clavulanate (AUGMENTIN) 875-125 MG per tablet 1 tablet  Status:  Discontinued  1 tablet Oral Every 12 hours 12/19/18 1427 12/19/18 1431   12/09/18 1200  ceFEPIme (MAXIPIME) 1 g in sodium chloride 0.9 % 100 mL IVPB  Status:  Discontinued     1 g 200 mL/hr over 30 Minutes Intravenous Every 24 hours 12/08/18 1452 12/09/18 0752   12/09/18 0900  cefTRIAXone (ROCEPHIN) 2 g in sodium chloride 0.9 % 100 mL IVPB  Status:  Discontinued     2 g 200 mL/hr over 30 Minutes Intravenous Every 24 hours 12/09/18 0752 12/14/18 1302   12/08/18 1600  azithromycin (ZITHROMAX) 500 mg in sodium chloride 0.9 % 250 mL IVPB  Status:  Discontinued     500 mg 250 mL/hr over 60 Minutes Intravenous Every 24 hours  12/08/18 1508 12/09/18 0752   12/08/18 1452  vancomycin variable dose per unstable renal function (pharmacist dosing)  Status:  Discontinued      Does not apply See admin instructions 12/08/18 1452 12/09/18 0752   12/08/18 1100  vancomycin (VANCOCIN) 2,000 mg in sodium chloride 0.9 % 500 mL IVPB     2,000 mg 250 mL/hr over 120 Minutes Intravenous  Once 12/08/18 1050 12/08/18 1433   12/08/18 1045  ceFEPIme (MAXIPIME) 2 g in sodium chloride 0.9 % 100 mL IVPB     2 g 200 mL/hr over 30 Minutes Intravenous  Once 12/08/18 1031 12/08/18 1241   12/08/18 1045  metroNIDAZOLE (FLAGYL) IVPB 500 mg     500 mg 100 mL/hr over 60 Minutes Intravenous  Once 12/08/18 1031 12/08/18 1326   12/08/18 1045  vancomycin (VANCOCIN) IVPB 1000 mg/200 mL premix  Status:  Discontinued     1,000 mg 200 mL/hr over 60 Minutes Intravenous  Once 12/08/18 1031 12/08/18 1035          Subjective: Doing well. No new complaints. Denies pain. Eating dinner  Objective: Vitals:   01/10/19 1408 01/10/19 2107 01/11/19 0552 01/11/19 1525  BP: 110/70 127/85 115/78 125/82  Pulse: (!) 108 (!) 110 (!) 102 100  Resp:  16 18   Temp: 98.6 F (37 C) 97.8 F (36.6 C) 98.1 F (36.7 C) 98.3 F (36.8 C)  TempSrc: Oral Oral Oral Oral  SpO2: 100% 100% 100% 100%  Weight:      Height:        Intake/Output Summary (Last 24 hours) at 01/11/2019 1814 Last data filed at 01/11/2019 0800 Gross per 24 hour  Intake 350 ml  Output 1750 ml  Net -1400 ml   Filed Weights   12/08/18 1200 12/08/18 2050 12/09/18 0300  Weight: 86.9 kg 60.8 kg 50 kg    Examination:  General exam: Appears calm and comfortable  Respiratory system: Clear to auscultation. Respiratory effort normal. Cardiovascular system: S1 & S2 heard, RRR.  Gastrointestinal system: Abdomen is nondistended, soft and nontender. Central nervous system: Alert and oriented. No focal neurological deficits. Extremities: no edema  Data Reviewed: I have personally reviewed  following labs and imaging studies  CBC: Recent Labs  Lab 01/06/19 0402 01/09/19 0330 01/11/19 0249  WBC 15.9* 13.2* 13.3*  NEUTROABS  --  8.5* 8.7*  HGB 9.7* 9.8* 10.7*  HCT 31.9* 31.3* 34.0*  MCV 87.6 85.8 86.7  PLT 861* 630* 867*   Basic Metabolic Panel: Recent Labs  Lab 01/06/19 0402 01/09/19 0330 01/11/19 0249  NA 136 138 140  K 4.6 4.3 4.2  CL 105 107 109  CO2 21* 19* 20*  GLUCOSE 191* 143* 150*  BUN 136* 127* 101*  CREATININE 4.32* 4.42* 4.53*  CALCIUM 9.3 9.0 9.3  MG  --  2.1 2.1  PHOS  --  5.8* 5.4*   GFR: Estimated Creatinine Clearance: 12.7 mL/min (A) (by C-G formula based on SCr of 4.53 mg/dL (H)). Liver Function Tests: Recent Labs  Lab 01/09/19 0330 01/11/19 0249  ALBUMIN 2.7* 3.0*   CBG: Recent Labs  Lab 01/10/19 1645 01/10/19 2109 01/11/19 0845 01/11/19 1139 01/11/19 1630  GLUCAP 267* 117* 268* 102* 237*   Urine analysis:    Component Value Date/Time   COLORURINE YELLOW 01/09/2019 1345   APPEARANCEUR TURBID (A) 01/09/2019 1345   LABSPEC 1.011 01/09/2019 1345   PHURINE 6.0 01/09/2019 1345   GLUCOSEU NEGATIVE 01/09/2019 1345   HGBUR SMALL (A) 01/09/2019 1345   BILIRUBINUR NEGATIVE 01/09/2019 1345   KETONESUR NEGATIVE 01/09/2019 1345   PROTEINUR 30 (A) 01/09/2019 1345   UROBILINOGEN 0.2 10/26/2013 0034   NITRITE NEGATIVE 01/09/2019 1345   LEUKOCYTESUR LARGE (A) 01/09/2019 1345   Sepsis Labs:  Recent Results (from the past 240 hour(s))  Culture, Urine     Status: Abnormal   Collection Time: 01/03/19  6:03 PM   Specimen: Urine, Catheterized  Result Value Ref Range Status   Specimen Description URINE, CATHETERIZED  Final   Special Requests   Final    NONE Performed at [facility], [place] [address], [place], [place] [address]    Culture >=100,000 COLONIES/mL SERRATIA MARCESCENS (A)  Final   Report Status 01/06/2019 FINAL  Final   Organism ID, Bacteria SERRATIA MARCESCENS (A)  Final      Susceptibility   Serratia  marcescens - MIC*    CEFAZOLIN >=64 RESISTANT Resistant     CEFTRIAXONE <=1 SENSITIVE Sensitive     CIPROFLOXACIN <=0.25 SENSITIVE Sensitive     GENTAMICIN <=1 SENSITIVE Sensitive     NITROFURANTOIN 256 RESISTANT Resistant     TRIMETH/SULFA <=20 SENSITIVE Sensitive     * >=100,000 COLONIES/mL SERRATIA MARCESCENS  Culture, Urine     Status: Abnormal (Preliminary result)   Collection Time: 01/09/19  1:10 PM   Specimen: Urine, Catheterized  Result Value Ref Range Status   Specimen Description URINE, CATHETERIZED  Final   Special Requests   Final    NONE Performed at [facility], [place] [address] [place], [place] [address]    Culture >=100,000 COLONIES/mL GRAM NEGATIVE RODS (A)  Final   Report Status PENDING  Incomplete    Scheduled Meds: . apixaban  5 mg Oral BID  . Chlorhexidine Gluconate Cloth  6 each Topical Q0600  . darbepoetin (ARANESP) injection - NON-DIALYSIS  60 mcg Subcutaneous Q Mon-1800  . feeding supplement (PRO-STAT SUGAR FREE 64)  30 mL Oral BID  . insulin aspart  0-15 Units Subcutaneous TID WC  . insulin aspart  0-5 Units Subcutaneous QHS  . insulin aspart  15 Units Subcutaneous TID WC  . insulin detemir  20 Units Subcutaneous Q24H  . mirtazapine  7.5 mg Oral QHS  . pantoprazole  40 mg Oral QHS   Continuous Infusions: . sodium chloride 10 mL/hr at 12/15/18 1400     LOS: 34 days    Donnamae Jude, MD [facility] Pager [phone]  If 7PM-7AM, please contact night-coverage [email or web address] Password [facility] 01/11/2019, 6:14 PM

## 2019-01-11 NOTE — TOC Progression Note (Signed)
Transition of Care [facility]) - Progression Note    Patient Details  Name: Timothy Madden MRN: [id number] Date of Birth: October 20, 1960  Transition of Care [facility]) CM/SW [place], [place] Phone Number: 01/11/2019, 9:24 AM  Clinical Narrative:    Await f/u from [facility]PS, regarding assistance with support and disposition. Delay due to holiday may be possible.  Still no offers- LOG placement due to pending Medicaid.    Expected Discharge Plan: [place] Barriers to Discharge: Continued Medical Work up, No SNF bed, Inadequate or no insurance, SNF Pending Medicaid, SNF Pending payor source - LOG  Expected Discharge Plan and Services Expected Discharge Plan: [place] In-house Referral: Clinical Social Work Discharge Planning Services: CM Consult, Operating Room Services, Medication Assistance Post Acute Care Choice: [place] arrangements for the past 2 months: Single Family Home  Readmission Risk Interventions Readmission Risk Prevention Plan 11/14/2018  Transportation Screening Complete  PCP or Specialist Appt within 3-5 Days Not Complete  Not Complete comments Clinics to which patient goes do not make appointments-it is walk in.  [place] or Home Care Consult Complete  Social Work Consult for [place] Planning/Counseling Complete  Palliative Care Screening Not Applicable  Medication Review (RN Care Manager) Referral to Pharmacy  Some recent data might be hidden

## 2019-01-12 LAB — GLUCOSE, CAPILLARY
Glucose-Capillary: 387 mg/dL — ABNORMAL HIGH (ref 70–99)
Glucose-Capillary: 70 mg/dL (ref 70–99)
Glucose-Capillary: 82 mg/dL (ref 70–99)
Glucose-Capillary: 273 mg/dL — ABNORMAL HIGH (ref 70–99)
Glucose-Capillary: 110 mg/dL — ABNORMAL HIGH (ref 70–99)

## 2019-01-12 LAB — URINE CULTURE: Culture: >=100000 — AB

## 2019-01-12 NOTE — Progress Notes (Addendum)
PROGRESS NOTE  Timothy Madden SWF:[id number] DOB: 1960-10-07 DOA: 12/08/2018 PCP: Marliss Coots, NP  HPI/Recap of past 24 hours: Per previous HPI: Patient is a 59 year old male prior history of type 2 diabetes, stage III CKD, osteomyelitis, DVT, homelessness presents with DKA. He was initially admitted to ICU for DKA with obtundation and acute kidney injury. Patient was transferred to [facility] service after DKA was resolved. Patient has had significantly elevated platelet count, but now improving. Hematology/oncology team has advised monitoring the platelet count closely and recommended to follow-up with CBC in 1 to 2 weeks. At baseline, patient has CKD 3B/4. AKI has improved significantly. Patient may have a new baseline renal function. Continue to monitor closely. Repeat ultrasound of the renal shows stable hydronephrosis and chronic cystitis. Patient is awaiting disposition.  January 12, 2019 Subjective: Patient seen and examined at bedside he denies any complaints today   Assessment/Plan: Active Problems:   Type 2 diabetes mellitus with stage 3 chronic kidney disease (HCC)   Sepsis ([place])   Essential hypertension   Acute renal failure superimposed on chronic kidney disease (HCC)   DKA, type 1, not at goal [facility])   Protein-calorie malnutrition, severe   Acute lower UTI   Bacteremia due to Gram-negative bacteria   Bilateral hydronephrosis   DVT (deep venous thrombosis) (HCC)   Metabolic acidosis   Anemia due to chronic kidney disease   Pressure injury of skin   Hyperkalemia   Hyperglycemia   Pyelonephritis  Diabetes mellitus with severe DKA:    Resolved, his hemoglobin A1c is 14.3, blood sugar is currently controlled with subcutaneous Levemir.   History of Serratia bacteremia and UTI on admission   He has completed a course of antibiotics then his urine culture grew  greater than 100,000 serratia again?  So he was treated with another course of antibiotics which  was completed on 01/10/2019: , has indwelling foley due to obstructive uropathy, also with some UPJ dysfunction and h/o stones. Per Urology, leave catheter in place--consider curbside ID for recommendations on how to clear this infection.  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  We will recheck his chemistry Repeat ultrasound of the kidneys shows stable hydronephrosis but with chronic cystitis.  Monitor closely for encephalopathy.  Patient is at increased risk of uremic encephalopathy.    Bilateral hydronephrosis with ectopic ureter Urology recommends to continue Foley catheter it was placed on December 28, 2018.  He also recommend urology follow-up as outpatient on discharge   Anemia of chronic disease Most likely secondary to chronic kidney disease.  Patient is currently on Aranesp twice weekly, hemoglobin  stable probably secondary to CKD   Thrombocytosis:    Probably secondary to iron deficiency or reactive hematology oncology recommend 2 doses of IV iron and to repeat CBC to check if platelets are improving.  Also hematology was consulted and they recommended repeat CBC in 1 to 2 weeks to see if it is improving and that this is likely reactive due to acute illness patient is asymptomatic and thrombosis seems to be improving and his platelets is coming down repeat CBC   Severe protein calorie malnutrition Patient is on Remeron for appetite stimulation appetite is good.     Pressure ulcers on admission Stage II sacral pressure ulcer on admission Pressure Injury 06/28/18 Foot Right black thicken area on ball of foot (Active)  06/28/18 1732  Location: Foot (ball of right foot)  Location Orientation: Right  Staging:   Wound Description (Comments): black thicken area on ball of foot  Present on Admission: Yes      Pressure Injury 12/13/18 Sacrum Left Stage II - Partial thickness loss of dermis presenting as a shallow open ulcer with a red, pink wound bed without slough. (Active)  12/13/18 0800  Location: Sacrum  Location Orientation: Left  Staging: Stage II - Partial thickness loss of dermis presenting as a shallow open ulcer with a red, pink wound bed without slough.  Wound Description (Comments):   Present on Admission:     Pressure Injury 12/13/18 Sacrum Right Stage II - Partial thickness loss of dermis presenting as a shallow open ulcer with a red, pink wound bed without slough. (Active)  12/13/18 0800  Location: Sacrum  Location Orientation: Right  Staging: Stage II - Partial thickness loss of dermis presenting as a shallow open ulcer with a red, pink wound bed without slough.  Wound Description (Comments):   Present on Admission:    .  Severity of Illness: The appropriate patient status for this patient is INPATIENT. Inpatient status is judged to be reasonable and necessary in order to provide the required intensity of service to ensure the patient's safety. The patient's presenting symptoms, physical exam findings, and initial radiographic and laboratory data in the context of their chronic comorbidities is felt to place them at high risk for further clinical deterioration. Furthermore, it is not anticipated that the patient will be medically stable for discharge from the hospital within 2 midnights of admission. The following factors support the patient status of inpatient.   " For SNF difficult placement  * I certify that at the point of admission it is my clinical judgment that the patient will require inpatient hospital care spanning beyond 2 midnights from the point of admission due to high intensity of service, high risk for further deterioration and high frequency of surveillance required.*     DVT prophylaxis: Eliquis Code Status: Full Family Communication: Disposition  Plan: SNF  Consultants:   Nephrology  Urology  Procedures:   None   Antimicrobials:              Anti-infectives (From admission, onward)       Start        Dose/Rate  Route  Frequency  Ordered  Stop     01/04/19 2200    cefTRIAXone (ROCEPHIN) 1 g in sodium chloride 0.9 % 100 mL IVPB  Status:  Discontinued       1 g  200 mL/hr over 30 Minutes  Intravenous  Every 24 hours  01/03/19 1213  01/09/19 0908     12/20/18 1600    piperacillin-tazobactam (ZOSYN) IVPB 3.375 g  Status:  Discontinued       3.375 g  12.5 mL/hr over 240 Minutes  Intravenous  Every 12 hours  12/20/18 1429  12/27/18 1053     12/19/18 1445    amoxicillin-clavulanate (AUGMENTIN) 500-125 MG per tablet 500 mg  Status:  Discontinued       1 tablet  Oral  Daily  12/19/18 1432  12/20/18 1424     12/19/18 1430    amoxicillin-clavulanate (AUGMENTIN) 875-125 MG per tablet 1 tablet  Status:  Discontinued       1 tablet  Oral  Every 12 hours  12/19/18 1427  12/19/18 1431     12/09/18 1200    ceFEPIme (MAXIPIME) 1  g in sodium chloride 0.9 % 100 mL IVPB  Status:  Discontinued       1 g  200 mL/hr over 30 Minutes  Intravenous  Every 24 hours  12/08/18 1452  12/09/18 0752     12/09/18 0900    cefTRIAXone (ROCEPHIN) 2 g in sodium chloride 0.9 % 100 mL IVPB  Status:  Discontinued       2 g  200 mL/hr over 30 Minutes  Intravenous  Every 24 hours  12/09/18 0752  12/14/18 1302     12/08/18 1600    azithromycin (ZITHROMAX) 500 mg in sodium chloride 0.9 % 250 mL IVPB  Status:  Discontinued       500 mg  250 mL/hr over 60 Minutes  Intravenous  Every 24 hours  12/08/18 1508  12/09/18 0752     12/08/18 1452    vancomycin variable dose per unstable renal function (pharmacist dosing)  Status:  Discontinued         Does not apply  See admin instructions  12/08/18 1452  12/09/18 0752     12/08/18 1100    vancomycin  (VANCOCIN) 2,000 mg in sodium chloride 0.9 % 500 mL IVPB       2,000 mg  250 mL/hr over 120 Minutes  Intravenous   Once  12/08/18 1050  12/08/18 1433     12/08/18 1045    ceFEPIme (MAXIPIME) 2 g in sodium chloride 0.9 % 100 mL IVPB       2 g  200 mL/hr over 30 Minutes  Intravenous   Once  12/08/18 1031  12/08/18 1241     12/08/18 1045    metroNIDAZOLE (FLAGYL) IVPB 500 mg       500 mg  100 mL/hr over 60 Minutes  Intravenous   Once  12/08/18 1031  12/08/18 1326     12/08/18 1045    vancomycin (VANCOCIN) IVPB 1000 mg/200 mL premix  Status:  Discontinued       1,000 mg  200 mL/hr over 60 Minutes  Intravenous   Once  12/08/18 1031  12/08/18 1035            Objective: Vitals:   01/11/19 1525 01/11/19 2151 01/12/19 0502 01/12/19 1404  BP: 125/82 114/87 112/86 120/84  Pulse: 100 (!) 103 (!) 103 (!) 109  Resp:  16 20 18   Temp: 98.3 F (36.8 C) 98.3 F (36.8 C) 98.3 F (36.8 C) 98.8 F (37.1 C)  TempSrc: Oral Oral  Oral  SpO2: 100% 100% 100% 100%  Weight:      Height:        Intake/Output Summary (Last 24 hours) at 01/12/2019 1934 Last data filed at 01/12/2019 1800 Gross per 24 hour  Intake 600 ml  Output 2650 ml  Net -2050 ml   Filed Weights   12/08/18 1200 12/08/18 2050 12/09/18 0300  Weight: 86.9 kg 60.8 kg 50 kg   Body mass index is 13.78 kg/m.  Exam:  . General: 59 y.o. year-old male well developed well nourished in no acute distress.  Alert and oriented x3. . Cardiovascular: Regular rate and rhythm with no rubs or gallops.  No thyromegaly or JVD noted.   Marland Kitchen Respiratory: Clear to auscultation with no wheezes or rales. Good inspiratory effort. . Abdomen: Soft nontender nondistended with normal bowel sounds x4 quadrants. . Musculoskeletal: No lower extremity edema. 2/4 pulses in all 4 extremities. . Skin: No ulcerative lesions noted or rashes, . Psychiatry: Mood is appropriate for condition  and setting    Data  Reviewed: CBC: Recent Labs  Lab 01/06/19 0402 01/09/19 0330 01/11/19 0249  WBC 15.9* 13.2* 13.3*  NEUTROABS  --  8.5* 8.7*  HGB 9.7* 9.8* 10.7*  HCT 31.9* 31.3* 34.0*  MCV 87.6 85.8 86.7  PLT 861* 630* 638*   Basic Metabolic Panel: Recent Labs  Lab 01/06/19 0402 01/09/19 0330 01/11/19 0249  NA 136 138 140  K 4.6 4.3 4.2  CL 105 107 109  CO2 21* 19* 20*  GLUCOSE 191* 143* 150*  BUN 136* 127* 101*  CREATININE 4.32* 4.42* 4.53*  CALCIUM 9.3 9.0 9.3  MG  --  2.1 2.1  PHOS  --  5.8* 5.4*   GFR: Estimated Creatinine Clearance: 12.7 mL/min (A) (by C-G formula based on SCr of 4.53 mg/dL (H)). Liver Function Tests: Recent Labs  Lab 01/09/19 0330 01/11/19 0249  ALBUMIN 2.7* 3.0*   No results for input(s): LIPASE, AMYLASE in the last 168 hours. No results for input(s): AMMONIA in the last 168 hours. Coagulation Profile: No results for input(s): INR, PROTIME in the last 168 hours. Cardiac Enzymes: No results for input(s): CKTOTAL, CKMB, CKMBINDEX, TROPONINI in the last 168 hours. BNP (last 3 results) No results for input(s): PROBNP in the last 8760 hours. HbA1C: No results for input(s): HGBA1C in the last 72 hours. CBG: Recent Labs  Lab 01/11/19 2149 01/12/19 0800 01/12/19 1159 01/12/19 1209 01/12/19 1647  GLUCAP 140* 387* 70 82 273*   Lipid Profile: No results for input(s): CHOL, HDL, LDLCALC, TRIG, CHOLHDL, LDLDIRECT in the last 72 hours. Thyroid Function Tests: No results for input(s): TSH, T4TOTAL, FREET4, T3FREE, THYROIDAB in the last 72 hours. Anemia Panel: No results for input(s): VITAMINB12, FOLATE, FERRITIN, TIBC, IRON, RETICCTPCT in the last 72 hours. Urine analysis:    Component Value Date/Time   COLORURINE YELLOW 01/09/2019 1345   APPEARANCEUR TURBID (A) 01/09/2019 1345   LABSPEC 1.011 01/09/2019 1345   PHURINE 6.0 01/09/2019 1345   GLUCOSEU NEGATIVE 01/09/2019 1345   HGBUR SMALL (A) 01/09/2019 1345   BILIRUBINUR NEGATIVE 01/09/2019 1345    KETONESUR NEGATIVE 01/09/2019 1345   PROTEINUR 30 (A) 01/09/2019 1345   UROBILINOGEN 0.2 10/26/2013 0034   NITRITE NEGATIVE 01/09/2019 1345   LEUKOCYTESUR LARGE (A) 01/09/2019 1345   Sepsis Labs: @LABRCNTIP (procalcitonin:4,lacticidven:4)  ) Recent Results (from the past 240 hour(s))  Culture, Urine     Status: Abnormal   Collection Time: 01/03/19  6:03 PM   Specimen: Urine, Catheterized  Result Value Ref Range Status   Specimen Description URINE, CATHETERIZED  Final   Special Requests   Final    NONE Performed at [facility], [place] [address], [place],  [address]    Culture >=100,000 COLONIES/mL SERRATIA MARCESCENS (A)  Final   Report Status 01/06/2019 FINAL  Final   Organism ID, Bacteria SERRATIA MARCESCENS (A)  Final      Susceptibility   Serratia marcescens - MIC*    CEFAZOLIN >=64 RESISTANT Resistant     CEFTRIAXONE <=1 SENSITIVE Sensitive     CIPROFLOXACIN <=0.25 SENSITIVE Sensitive     GENTAMICIN <=1 SENSITIVE Sensitive     NITROFURANTOIN 256 RESISTANT Resistant     TRIMETH/SULFA <=20 SENSITIVE Sensitive     * >=100,000 COLONIES/mL SERRATIA MARCESCENS  Culture, Urine     Status: Abnormal   Collection Time: 01/09/19  1:10 PM   Specimen: Urine, Catheterized  Result Value Ref Range Status   Specimen Description URINE, CATHETERIZED  Final   Special  Requests   Final    NONE Performed at [facility], [place] [address], [place], [place] [address]    Culture >=100,000 COLONIES/mL SERRATIA MARCESCENS (A)  Final   Report Status 01/12/2019 FINAL  Final   Organism ID, Bacteria SERRATIA MARCESCENS (A)  Final      Susceptibility   Serratia marcescens - MIC*    CEFAZOLIN >=64 RESISTANT Resistant     CEFTRIAXONE <=0.25 SENSITIVE Sensitive     CIPROFLOXACIN <=0.25 SENSITIVE Sensitive     GENTAMICIN <=1 SENSITIVE Sensitive     NITROFURANTOIN 256 RESISTANT Resistant     TRIMETH/SULFA <=20 SENSITIVE Sensitive     * >=100,000 COLONIES/mL SERRATIA MARCESCENS       Studies: No results found.  Scheduled Meds: . apixaban  5 mg Oral BID  . Chlorhexidine Gluconate Cloth  6 each Topical Q0600  . darbepoetin (ARANESP) injection - NON-DIALYSIS  60 mcg Subcutaneous Q Mon-1800  . feeding supplement (PRO-STAT SUGAR FREE 64)  30 mL Oral BID  . insulin aspart  0-15 Units Subcutaneous TID WC  . insulin aspart  0-5 Units Subcutaneous QHS  . insulin aspart  15 Units Subcutaneous TID WC  . insulin detemir  20 Units Subcutaneous Q24H  . mirtazapine  7.5 mg Oral QHS  . pantoprazole  40 mg Oral QHS    Continuous Infusions: . sodium chloride 10 mL/hr at 12/15/18 1400     LOS: 35 days     Cristal Deer, MD [facility]  To reach me or the doctor on call, go to: [email or web address] Password [facility]  01/12/2019, 7:34 PM

## 2019-01-13 LAB — GLUCOSE, CAPILLARY
Glucose-Capillary: 140 mg/dL — ABNORMAL HIGH (ref 70–99)
Glucose-Capillary: 136 mg/dL — ABNORMAL HIGH (ref 70–99)
Glucose-Capillary: 94 mg/dL (ref 70–99)
Glucose-Capillary: 111 mg/dL — ABNORMAL HIGH (ref 70–99)

## 2019-01-13 NOTE — Progress Notes (Signed)
PROGRESS NOTE  Timothy Madden ERX:[id number] DOB: November 26, 1960 DOA: 12/08/2018 PCP: Timothy Coots, NP  HPI/Recap of past 24 hours: Per previous HPI: Patient is a 59 year old male prior history of type 2 diabetes, stage III CKD, osteomyelitis, DVT, homelessness presents with DKA. He was initially admitted to ICU for DKA with obtundation and acute kidney injury. Patient was transferred to [facility] service after DKA was resolved. Patient has had significantly elevated platelet count, but now improving. Hematology/oncology team has advised monitoring the platelet count closely and recommended to follow-up with CBC in 1 to 2 weeks. At baseline, patient has CKD 3B/4. AKI has improved significantly. Patient may have a new baseline renal function. Continue to monitor closely. Repeat ultrasound of the renal shows stable hydronephrosis and chronic cystitis. Patient is awaiting disposition.  January 12, 2019 Subjective: Patient seen and examined at bedside he denies any complaints today  January 3,/2021 Subjective: Patient seen and examined at bedside he denies any complaints today.  He still has a Foley catheter  Assessment/Plan: Active Problems:   Type 2 diabetes mellitus with stage 3 chronic kidney disease (HCC)   Sepsis ([place])   Essential hypertension   Acute renal failure superimposed on chronic kidney disease (HCC)   DKA, type 1, not at goal [facility])   Protein-calorie malnutrition, severe   Acute lower UTI   Bacteremia due to Gram-negative bacteria   Bilateral hydronephrosis   DVT (deep venous thrombosis) (HCC)   Metabolic acidosis   Anemia due to chronic kidney disease   Pressure injury of skin   Hyperkalemia   Hyperglycemia   Pyelonephritis  Diabetes mellitus with severe DKA:    Resolved, his hemoglobin A1c is 14.3, blood sugar is currently controlled with subcutaneous Levemir.   History of Serratia bacteremia and UTI on admission   He has completed a course of  antibiotics then his urine culture grew  greater than 100,000 serratia again?  So he was treated with another course of antibiotics which was completed on 01/10/2019: , has indwelling foley due to obstructive uropathy, also with some UPJ dysfunction and h/o stones. Per Urology, leave catheter in place--consider curbside ID for recommendations on how to clear this infection.  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  We will recheck his chemistry Repeat ultrasound of the kidneys shows stable hydronephrosis but with chronic cystitis.  Monitor closely for encephalopathy.  Patient is at increased risk of uremic encephalopathy.    Bilateral hydronephrosis with ectopic ureter Urology recommends to continue Foley catheter it was placed on December 28, 2018.  He also recommend urology follow-up as outpatient on discharge   Anemia of chronic disease Most likely secondary to chronic kidney disease.  Patient is currently on Aranesp twice weekly, hemoglobin  stable probably secondary to CKD   Thrombocytosis:    Probably secondary to iron deficiency or reactive hematology oncology recommend 2 doses of IV iron and to repeat CBC to check if platelets are improving.  Also hematology was consulted and they recommended repeat CBC in 1 to 2 weeks to see if it is improving and that this is likely reactive due to acute illness patient is asymptomatic and thrombosis seems to be improving and his platelets is coming down repeat CBC   Severe protein calorie malnutrition Patient is on Remeron for appetite stimulation appetite is good.     Pressure ulcers on  admission Stage II sacral pressure ulcer on admission Pressure Injury 06/28/18 Foot Right black thicken area on ball of foot (Active)  06/28/18 1732  Location: Foot (ball of right foot)   Location Orientation: Right  Staging:   Wound Description (Comments): black thicken area on ball of foot  Present on Admission: Yes    Pressure Injury 12/13/18 Sacrum Left Stage II - Partial thickness loss of dermis presenting as a shallow open ulcer with a red, pink wound bed without slough. (Active)  12/13/18 0800  Location: Sacrum  Location Orientation: Left  Staging: Stage II - Partial thickness loss of dermis presenting as a shallow open ulcer with a red, pink wound bed without slough.  Wound Description (Comments):   Present on Admission:     Pressure Injury 12/13/18 Sacrum Right Stage II - Partial thickness loss of dermis presenting as a shallow open ulcer with a red, pink wound bed without slough. (Active)  12/13/18 0800  Location: Sacrum  Location Orientation: Right  Staging: Stage II - Partial thickness loss of dermis presenting as a shallow open ulcer with a red, pink wound bed without slough.  Wound Description (Comments):   Present on Admission:    .  Severity of Illness: The appropriate patient status for this patient is INPATIENT. Inpatient status is judged to be reasonable and necessary in order to provide the required intensity of service to ensure the patient's safety. The patient's presenting symptoms, physical exam findings, and initial radiographic and laboratory data in the context of their chronic comorbidities is felt to place them at high risk for further clinical deterioration. Furthermore, it is not anticipated that the patient will be medically stable for discharge from the hospital within 2 midnights of admission. The following factors support the patient status of inpatient.   " For SNF difficult placement  * I certify that at the point of admission it is my clinical judgment that the patient will require inpatient hospital care spanning beyond 2 midnights from the point of admission due to high intensity of service, high risk for further  deterioration and high frequency of surveillance required.*     DVT prophylaxis: Eliquis Code Status: Full Family Communication: Disposition Plan: SNF  Consultants:   Nephrology  Urology  Procedures:   None   Antimicrobials:         Objective: Vitals:   01/12/19 0502 01/12/19 1404 01/12/19 2012 01/13/19 0439  BP: 112/86 120/84 102/80 (!) 132/91  Pulse: (!) 103 (!) 109 (!) 110 (!) 101  Resp: 20 18 16 18   Temp: 98.3 F (36.8 C) 98.8 F (37.1 C) 98.6 F (37 C) 98.9 F (37.2 C)  TempSrc:  Oral Oral Oral  SpO2: 100% 100% 100% 100%  Weight:      Height:        Intake/Output Summary (Last 24 hours) at 01/13/2019 1107 Last data filed at 01/13/2019 0800 Gross per 24 hour  Intake 840 ml  Output 1800 ml  Net -960 ml   Filed Weights   12/08/18 1200 12/08/18 2050 12/09/18 0300  Weight: 86.9 kg 60.8 kg 50 kg   Body mass index is 13.78 kg/m.  Exam:  . General: 59 y.o. year-old male well developed well nourished in no acute distress.  Alert and oriented x3. . Cardiovascular: Regular rate and rhythm with no rubs or gallops.  No thyromegaly or JVD noted.   Marland Kitchen Respiratory: Clear to auscultation with no wheezes or rales. Good inspiratory effort. . Abdomen: Soft nontender nondistended with  normal bowel sounds x4 quadrants. . Musculoskeletal: No lower extremity edema. 2/4 pulses in all 4 extremities. . Skin: No ulcerative lesions noted or rashes, . Psychiatry: Mood is appropriate for condition and setting    Data Reviewed: CBC: Recent Labs  Lab 01/09/19 0330 01/11/19 0249  WBC 13.2* 13.3*  NEUTROABS 8.5* 8.7*  HGB 9.8* 10.7*  HCT 31.3* 34.0*  MCV 85.8 86.7  PLT 630* 016*   Basic Metabolic Panel: Recent Labs  Lab 01/09/19 0330 01/11/19 0249  NA 138 140  K 4.3 4.2  CL 107 109  CO2 19* 20*  GLUCOSE 143* 150*  BUN 127* 101*  CREATININE 4.42* 4.53*  CALCIUM 9.0 9.3  MG 2.1 2.1  PHOS 5.8* 5.4*   GFR: Estimated Creatinine Clearance: 12.7 mL/min (A)  (by C-G formula based on SCr of 4.53 mg/dL (H)). Liver Function Tests: Recent Labs  Lab 01/09/19 0330 01/11/19 0249  ALBUMIN 2.7* 3.0*   No results for input(s): LIPASE, AMYLASE in the last 168 hours. No results for input(s): AMMONIA in the last 168 hours. Coagulation Profile: No results for input(s): INR, PROTIME in the last 168 hours. Cardiac Enzymes: No results for input(s): CKTOTAL, CKMB, CKMBINDEX, TROPONINI in the last 168 hours. BNP (last 3 results) No results for input(s): PROBNP in the last 8760 hours. HbA1C: No results for input(s): HGBA1C in the last 72 hours. CBG: Recent Labs  Lab 01/12/19 1159 01/12/19 1209 01/12/19 1647 01/12/19 2007 01/13/19 0738  GLUCAP 70 82 273* 110* 140*   Lipid Profile: No results for input(s): CHOL, HDL, LDLCALC, TRIG, CHOLHDL, LDLDIRECT in the last 72 hours. Thyroid Function Tests: No results for input(s): TSH, T4TOTAL, FREET4, T3FREE, THYROIDAB in the last 72 hours. Anemia Panel: No results for input(s): VITAMINB12, FOLATE, FERRITIN, TIBC, IRON, RETICCTPCT in the last 72 hours. Urine analysis:    Component Value Date/Time   COLORURINE YELLOW 01/09/2019 1345   APPEARANCEUR TURBID (A) 01/09/2019 1345   LABSPEC 1.011 01/09/2019 1345   PHURINE 6.0 01/09/2019 1345   GLUCOSEU NEGATIVE 01/09/2019 1345   HGBUR SMALL (A) 01/09/2019 1345   BILIRUBINUR NEGATIVE 01/09/2019 1345   KETONESUR NEGATIVE 01/09/2019 1345   PROTEINUR 30 (A) 01/09/2019 1345   UROBILINOGEN 0.2 10/26/2013 0034   NITRITE NEGATIVE 01/09/2019 1345   LEUKOCYTESUR LARGE (A) 01/09/2019 1345   Sepsis Labs: @LABRCNTIP (procalcitonin:4,lacticidven:4)  ) Recent Results (from the past 240 hour(s))  Culture, Urine     Status: Abnormal   Collection Time: 01/03/19  6:03 PM   Specimen: Urine, Catheterized  Result Value Ref Range Status   Specimen Description URINE, CATHETERIZED  Final   Special Requests   Final    NONE Performed at [facility], [place] [address], [place], [place] [address]    Culture >=100,000 COLONIES/mL SERRATIA MARCESCENS (A)  Final   Report Status 01/06/2019 FINAL  Final   Organism ID, Bacteria SERRATIA MARCESCENS (A)  Final      Susceptibility   Serratia marcescens - MIC*    CEFAZOLIN >=64 RESISTANT Resistant     CEFTRIAXONE <=1 SENSITIVE Sensitive     CIPROFLOXACIN <=0.25 SENSITIVE Sensitive     GENTAMICIN <=1 SENSITIVE Sensitive     NITROFURANTOIN 256 RESISTANT Resistant     TRIMETH/SULFA <=20 SENSITIVE Sensitive     * >=100,000 COLONIES/mL SERRATIA MARCESCENS  Culture, Urine     Status: Abnormal   Collection Time: 01/09/19  1:10 PM   Specimen: Urine, Catheterized  Result Value Ref Range Status   Specimen Description URINE,  CATHETERIZED  Final   Special Requests   Final    NONE Performed at [facility], [place] [address], [place], [place] [address]    Culture >=100,000 COLONIES/mL SERRATIA MARCESCENS (A)  Final   Report Status 01/12/2019 FINAL  Final   Organism ID, Bacteria SERRATIA MARCESCENS (A)  Final      Susceptibility   Serratia marcescens - MIC*    CEFAZOLIN >=64 RESISTANT Resistant     CEFTRIAXONE <=0.25 SENSITIVE Sensitive     CIPROFLOXACIN <=0.25 SENSITIVE Sensitive     GENTAMICIN <=1 SENSITIVE Sensitive     NITROFURANTOIN 256 RESISTANT Resistant     TRIMETH/SULFA <=20 SENSITIVE Sensitive     * >=100,000 COLONIES/mL SERRATIA MARCESCENS      Studies: No results found.  Scheduled Meds: . apixaban  5 mg Oral BID  . Chlorhexidine Gluconate Cloth  6 each Topical Q0600  . darbepoetin (ARANESP) injection - NON-DIALYSIS  60 mcg Subcutaneous Q Mon-1800  . feeding supplement (PRO-STAT SUGAR FREE 64)  30 mL Oral BID  . insulin aspart  0-15 Units Subcutaneous TID WC  . insulin aspart  0-5 Units Subcutaneous QHS  . insulin aspart  15 Units Subcutaneous TID WC  . insulin detemir  20 Units Subcutaneous Q24H  . mirtazapine  7.5 mg Oral QHS  . pantoprazole  40 mg Oral QHS    Continuous Infusions: .  sodium chloride 10 mL/hr at 12/15/18 1400     LOS: 36 days     Cristal Deer, MD [facility]  To reach me or the doctor on call, go to: [email or web address] Password [email or web address]  01/13/2019, 11:07 AM

## 2019-01-14 LAB — BASIC METABOLIC PANEL
Anion gap: 12 (ref 5–15)
BUN: 98 mg/dL — ABNORMAL HIGH (ref 6–20)
CO2: 17 mmol/L — ABNORMAL LOW (ref 22–32)
Calcium: 9.7 mg/dL (ref 8.9–10.3)
Chloride: 110 mmol/L (ref 98–111)
Creatinine, Ser: 4.45 mg/dL — ABNORMAL HIGH (ref 0.61–1.24)
GFR calc Af Amer: 16 mL/min — ABNORMAL LOW (ref >60)
GFR calc non Af Amer: 14 mL/min — ABNORMAL LOW (ref >60)
Glucose, Bld: 222 mg/dL — ABNORMAL HIGH (ref 70–99)
Potassium: 4.3 mmol/L (ref 3.5–5.1)
Sodium: 139 mmol/L (ref 135–145)

## 2019-01-14 LAB — CBC WITH DIFFERENTIAL/PLATELET
Abs Immature Granulocytes: 0.17 10*3/uL — ABNORMAL HIGH (ref 0.00–0.07)
Basophils Absolute: 0.1 10*3/uL (ref 0.0–0.1)
Basophils Relative: 1 %
Eosinophils Absolute: 0.2 10*3/uL (ref 0.0–0.5)
Eosinophils Relative: 2 %
HCT: 38.3 % — ABNORMAL LOW (ref 39.0–52.0)
Hemoglobin: 11.7 g/dL — ABNORMAL LOW (ref 13.0–17.0)
Immature Granulocytes: 1 %
Lymphocytes Relative: 21 %
Lymphs Abs: 3 10*3/uL (ref 0.7–4.0)
MCH: 27.5 pg (ref 26.0–34.0)
MCHC: 30.5 g/dL (ref 30.0–36.0)
MCV: 89.9 fL (ref 80.0–100.0)
Monocytes Absolute: 1.4 10*3/uL — ABNORMAL HIGH (ref 0.1–1.0)
Monocytes Relative: 10 %
Neutro Abs: 9.3 10*3/uL — ABNORMAL HIGH (ref 1.7–7.7)
Neutrophils Relative %: 65 %
Platelets: 424 10*3/uL — ABNORMAL HIGH (ref 150–400)
RBC: 4.26 MIL/uL (ref 4.22–5.81)
RDW: 21.7 % — ABNORMAL HIGH (ref 11.5–15.5)
WBC: 14.2 10*3/uL — ABNORMAL HIGH (ref 4.0–10.5)
nRBC: 0 % (ref 0.0–0.2)

## 2019-01-14 LAB — GLUCOSE, CAPILLARY
Glucose-Capillary: 144 mg/dL — ABNORMAL HIGH (ref 70–99)
Glucose-Capillary: 224 mg/dL — ABNORMAL HIGH (ref 70–99)
Glucose-Capillary: 57 mg/dL — ABNORMAL LOW (ref 70–99)
Glucose-Capillary: 81 mg/dL (ref 70–99)
Glucose-Capillary: 228 mg/dL — ABNORMAL HIGH (ref 70–99)

## 2019-01-14 NOTE — Progress Notes (Signed)
Physical Therapy Treatment Patient Details Name: Timothy Madden MRN: [id number] DOB: 03/01/1960 Today's Date: 01/14/2019    History of Present Illness This is a 59 year old man with a history of numerous psychiatric issues, diabetes poorly controlled supposed to be on home insulin presenting with obtundation from home found to be in severe DKA/HOCM.  PMH DKA, DM, L second finger amputation.    PT Comments    Continuing work on functional mobility and activity tolerance;  Agreeing to walk and motivated to walk the entire unit; used RW for support well; will consider bringing a cane to practice with next session, and perhaps perform stirs for strengthening and balance Timothy Madden agreed to the idea)   Follow Up Recommendations  SNF     Equipment Recommendations  Rolling walker with 5" wheels    Recommendations for Other Services       Precautions / Restrictions Precautions Precautions: Fall Precaution Comments: fall risk greatly decr withuse of RW    Mobility  Bed Mobility               General bed mobility comments: up in recliner upon arrival  Transfers Overall transfer level: Needs assistance Equipment used: Rolling walker (2 wheeled) Transfers: Sit to/from Stand Sit to Stand: Min guard         General transfer comment: min/guard for safety. good use of UE  Ambulation/Gait Ambulation/Gait assistance: Supervision Gait Distance (Feet): 550 Feet Assistive device: Rolling walker (2 wheeled) Gait Pattern/deviations: Decreased step length - left;Step-through pattern     General Gait Details: Focused on simply increasing amb distance   Stairs             Wheelchair Mobility    Modified Rankin (Stroke Patients Only)       Balance     Sitting balance-Leahy Scale: Good       Standing balance-Leahy Scale: Fair                              Cognition Arousal/Alertness: Awake/alert Behavior During Therapy: Flat affect Overall Cognitive  Status: No family/caregiver present to determine baseline cognitive functioning                                 General Comments: Answers "yes" to almost everything. Did answer a few questions with multi- word responses.      Exercises      General Comments        Pertinent Vitals/Pain Pain Assessment: No/denies pain    Home Living                      Prior Function            PT Goals (current goals can now be found in the care plan section) Acute Rehab PT Goals Patient Stated Goal: did not state, but agreeable to walk PT Goal Formulation: With patient Time For Goal Achievement: 01/24/19 Potential to Achieve Goals: Fair Progress towards PT goals: Progressing toward goals    Frequency    Min 2X/week      PT Plan Current plan remains appropriate    Co-evaluation              AM-PAC PT "6 Clicks" Mobility   Outcome Measure  Help needed turning from your back to your side while in a flat bed without using bedrails?: A Little Help  needed moving from lying on your back to sitting on the side of a flat bed without using bedrails?: A Little Help needed moving to and from a bed to a chair (including a wheelchair)?: A Little Help needed standing up from a chair using your arms (e.g., wheelchair or bedside chair)?: A Little Help needed to walk in hospital room?: A Little Help needed climbing 3-5 steps with a railing? : A Little 6 Click Score: 18    End of Session Equipment Utilized During Treatment: Gait belt Activity Tolerance: Patient tolerated treatment well Patient left: in bed;with bed alarm set;with call bell/phone within reach Nurse Communication: Mobility status PT Visit Diagnosis: Other abnormalities of gait and mobility (R26.89);Muscle weakness (generalized) (M62.81);Other symptoms and signs involving the nervous system (R29.898)     Time: 1416-1430 PT Time Calculation (min) (ACUTE ONLY): 14 min  Charges:  $Gait Training:  8-22 mins                     Roney Marion, [place]  Acute Rehabilitation Services Pager [phone] Office [place] 01/14/2019, 5:13 PM

## 2019-01-14 NOTE — Plan of Care (Signed)

## 2019-01-14 NOTE — Progress Notes (Signed)
PROGRESS NOTE  Timothy Madden DXI:[id number] DOB: December 15, 1960 DOA: 12/08/2018 PCP: Marliss Coots, NP  Brief History   59 year old man admitted 11/28 to ICU for DKA and acute kidney injury.  A & P  Diabetes mellitus type 2. --CBG stable.  Continue Levemir, sliding scale insulin, meal coverage.  Acute kidney injury superimposed on CKD stage IV with obstructive uropathy, acute urinary retention, bilateral hydronephrosis.  Foley catheter placed December 18.  Ultrasound showed stable hydronephrosis. --Renal function appears stable.  Continue Foley catheter as per urology.  Follow-up with urology as an outpatient.  Severe protein calorie malnutrition --Continue per dietitian  Comment.  Awaiting placement.  Resolved Hospital Problem list    DKA  Serratia bacteremia and UTI on admission   DVT prophylaxis: apixaban Code Status: Full Family Communication: none Disposition Plan: SNF, placement difficult   Murray Hodgkins, MD  [facility] Direct contact: see [email or web address] (further directions at bottom of note if needed) 7PM-7AM contact night coverage as at bottom of note 01/14/2019, 9:57 AM  LOS: 37 days   Significant Hospital Events   .    Consults:  .    Procedures:  .   Significant Diagnostic Tests:  Marland Kitchen    Micro Data:  .    Antimicrobials:  .   Interval History/Subjective  Feels okay, not shortness of breath, no pain.  Tolerating diet.  Objective   Vitals:  Vitals:   01/13/19 2125 01/14/19 0459  BP: 114/82 127/90  Pulse: (!) 101 97  Resp: 16 16  Temp: 98 F (36.7 C) 98.4 F (36.9 C)  SpO2: 100% 100%    Exam:  Constitutional.  Appears calm, comfortable. Respiratory.  Clear to auscultation bilaterally.  No wheezes, rales or rhonchi.  Normal respiratory effort. Cardiovascular.  Regular rate and rhythm.  No murmur, rub or gallop.  No lower extremity edema. GU.  Foley catheter in place. Musculoskeletal.  Standing with walker without  assistance. Psychiatric.  Speech fluent and clear.  I have personally reviewed the following:   Today's Data  . CBG stable . WBC stable at 14.2.  Chronic since at least June 2020 with a few episodes of normalization.  Scheduled Meds: . apixaban  5 mg Oral BID  . Chlorhexidine Gluconate Cloth  6 each Topical Q0600  . darbepoetin (ARANESP) injection - NON-DIALYSIS  60 mcg Subcutaneous Q Mon-1800  . feeding supplement (PRO-STAT SUGAR FREE 64)  30 mL Oral BID  . insulin aspart  0-15 Units Subcutaneous TID WC  . insulin aspart  0-5 Units Subcutaneous QHS  . insulin aspart  15 Units Subcutaneous TID WC  . insulin detemir  20 Units Subcutaneous Q24H  . mirtazapine  7.5 mg Oral QHS  . pantoprazole  40 mg Oral QHS   Continuous Infusions: . sodium chloride 10 mL/hr at 12/15/18 1400    Active Problems:   Type 2 diabetes mellitus with stage 3 chronic kidney disease ([place])   Sepsis ([place])   Essential hypertension   Acute renal failure superimposed on chronic kidney disease (HCC)   DKA, type 1, not at goal [facility])   Protein-calorie malnutrition, severe   Acute lower UTI   Bacteremia due to Gram-negative bacteria   Bilateral hydronephrosis   DVT (deep venous thrombosis) (HCC)   Metabolic acidosis   Anemia due to chronic kidney disease   Pressure injury of skin   Hyperkalemia   Hyperglycemia   Pyelonephritis   LOS: 37 days   How to contact the [facility] Attending or Consulting provider  7A - 7P or covering provider during after hours [place], for this patient?  1. Check the care team in [facility] and look for a) attending/consulting [facility] provider listed and b) the [facility] team listed 2. Log into [email or web address] and use [place]'s universal password to access. If you do not have the password, please contact the hospital operator. 3. Locate the [facility] provider you are looking for under [facility] and page to a number that you can be directly reached. 4. If you still have difficulty reaching the  provider, please page the [facility] (Director on Call) for the Hospitalists listed on amion for assistance.

## 2019-01-14 NOTE — Progress Notes (Signed)
   01/14/19 1554  MEWS Score  Resp 17  Pulse Rate (!) 109  BP 96/73  Temp 98.9 F (37.2 C)  SpO2 100 %  O2 Device Room Air  MEWS Score  MEWS RR 0  MEWS Pulse 1  MEWS Systolic 1  MEWS LOC 0  MEWS Temp 0  MEWS Score 2  MEWS Score Color Yellow  MEWS Assessment  Is this an acute change? Yes  MEWS guidelines implemented *See [place]  Provider Notification  Provider Name/Title Murray Hodgkins, MD  Date Provider Notified 01/14/19  Time Provider Notified 1559  Notification Type Page  Notification Reason Other (Comment) (per protocol)  Response No new orders  Date of Provider Response 01/14/19  Time of Provider Response 1603

## 2019-01-14 NOTE — Progress Notes (Signed)
Hypoglycemic Event  CBG: 57  Treatment: 4oz juice  Symptoms: asymptomatic  Follow-up CBG: Time: 1738 CBG Result: 81  Possible Reasons for Event: unknown  Comments/MD notified: Murray Hodgkins, MD paged     Racheal Patches, RN

## 2019-01-14 NOTE — Plan of Care (Signed)
  Problem: Education: Goal: Knowledge of General Education information will improve Description: Including pain rating scale, medication(s)/side effects and non-pharmacologic comfort measures 01/14/2019 1615 by Racheal Patches, RN Outcome: Progressing 01/14/2019 0902 by Racheal Patches, RN Outcome: Progressing   Problem: Health Behavior/Discharge Planning: Goal: Ability to manage health-related needs will improve 01/14/2019 1615 by Racheal Patches, RN Outcome: Progressing 01/14/2019 0902 by Racheal Patches, RN Outcome: Progressing   Problem: Clinical Measurements: Goal: Ability to maintain clinical measurements within normal limits will improve 01/14/2019 1615 by Racheal Patches, RN Outcome: Progressing 01/14/2019 0902 by Racheal Patches, RN Outcome: Progressing   Problem: Activity: Goal: Risk for activity intolerance will decrease 01/14/2019 1615 by Racheal Patches, RN Outcome: Progressing 01/14/2019 0902 by Racheal Patches, RN Outcome: Progressing   Problem: Nutrition: Goal: Adequate nutrition will be maintained 01/14/2019 1615 by Racheal Patches, RN Outcome: Progressing 01/14/2019 0902 by Racheal Patches, RN Outcome: Progressing   Problem: Elimination: Goal: Will not experience complications related to bowel motility Outcome: Progressing Goal: Will not experience complications related to urinary retention Outcome: Progressing   Problem: Pain Managment: Goal: General experience of comfort will improve 01/14/2019 1615 by Racheal Patches, RN Outcome: Progressing 01/14/2019 0902 by Racheal Patches, RN Outcome: Progressing   Problem: Safety: Goal: Ability to remain free from injury will improve 01/14/2019 1615 by Racheal Patches, RN Outcome: Progressing 01/14/2019 0902 by Racheal Patches, RN Outcome: Progressing   Problem: Skin Integrity: Goal: Risk for impaired skin integrity will decrease 01/14/2019 1615 by Racheal Patches, RN Outcome: Progressing 01/14/2019  0902 by Racheal Patches, RN Outcome: Progressing

## 2019-01-15 LAB — GLUCOSE, CAPILLARY
Glucose-Capillary: 245 mg/dL — ABNORMAL HIGH (ref 70–99)
Glucose-Capillary: 74 mg/dL (ref 70–99)
Glucose-Capillary: 247 mg/dL — ABNORMAL HIGH (ref 70–99)
Glucose-Capillary: 107 mg/dL — ABNORMAL HIGH (ref 70–99)

## 2019-01-15 MED ORDER — INSULIN DETEMIR 100 UNIT/ML ~~LOC~~ SOLN
10.0000 [IU] | SUBCUTANEOUS | Status: DC
  Administered 2019-01-15 – 2019-01-18 (×4): 10 [IU] via SUBCUTANEOUS
  Filled 2019-01-15 (×5): qty 0.1

## 2019-01-15 NOTE — Progress Notes (Signed)
PROGRESS NOTE    Timothy Madden  GYK:[id number] DOB: [birth year] DOA: 12/08/2018 PCP: Marliss Coots, NP   Brief Narrative:  The patient is an 59 YO man admitted 12/08/18 to ICU for DKA and AKI, recovered per protocol for this in ICU, has been extended stay as needs SNF. Is homeless and has no insurance. With 2 treatments for pyelonephritis but needs chronic catheter for obstructive uropathy and urology thinks he needs to keep catheter.   Medically stable for some time now  Assessment & Plan:   Active Problems:   Type 2 diabetes mellitus with stage 3 chronic kidney disease (HCC)   Sepsis ([place])   Essential hypertension   Acute renal failure superimposed on chronic kidney disease (HCC)   DKA, type 1, not at goal [facility])   Protein-calorie malnutrition, severe   Acute lower UTI   Bacteremia due to Gram-negative bacteria   Bilateral hydronephrosis   DVT (deep venous thrombosis) (HCC)   Metabolic acidosis   Anemia due to chronic kidney disease   Pressure injury of skin   Hyperkalemia   Hyperglycemia   Pyelonephritis  Diabetes mellitus type 2, DKA resolved: -DKA resolved, monitoring -on levemir and SSI meal coverage  AKI on CKD stage 4, resolved AKI: -Cr stable at 4.45 yesterday -monitoring Cr every 2-3 days -catheter in place for obstructive uropathy -no current antibiotics  Severe protein calorie malnutrition: -per dietician  Anemia associated with CKD: -aranesp weekly -last Hg 11.7 and stable -monitor CBC every 3-4 days -monitor for signs of bleeding  DVT: -noted recent but no date on H/P -apixaban BID  DVT prophylaxis: Apixaban Code Status: Full Family Communication: no family at bedside Disposition Plan: SNF when available difficult due to lack of insurance  Consultants:   none  Procedures:   none  Antimicrobials:   None currently   Subjective: Doing well this morning. Sleeping okay. Appetite normal. No chest pains, SOB, cough. Denies abdominal  pain, nausea/vomiting, diarrhea/constipation. Last BM yesterday. No complaints at this time.   Objective: Vitals:   01/14/19 2007 01/15/19 0022 01/15/19 0421 01/15/19 0800  BP: 121/85 120/80 118/82 116/90  Pulse: (!) 103 (!) 101 (!) 101 (!) 106  Resp: 16 15 16 16   Temp: 99.9 F (37.7 C) 98.4 F (36.9 C) 98.1 F (36.7 C) 98.5 F (36.9 C)  TempSrc: Oral Oral Oral Oral  SpO2: 100% 100% 100% 100%  Weight:      Height:        Intake/Output Summary (Last 24 hours) at 01/15/2019 1008 Last data filed at 01/15/2019 0644 Gross per 24 hour  Intake 960 ml  Output 2325 ml  Net -1365 ml   Filed Weights   12/08/18 1200 12/08/18 2050 12/09/18 0300  Weight: 86.9 kg 60.8 kg 50 kg    Examination:  General exam: Appears calm and comfortable, black male Respiratory system: Clear to auscultation. Respiratory effort normal. Cardiovascular system: S1 & S2 heard, RRR. No JVD, murmurs, rubs, gallops or clicks. No pedal edema. Gastrointestinal system: Abdomen is nondistended, soft and nontender. No organomegaly or masses felt. Normal bowel sounds heard. Central nervous system: Alert and oriented. No focal neurological deficits. Extremities: Symmetric 5 x 5 power. Skin: No rashes, lesions or ulcers Psychiatry: Judgement and insight appear normal. Mood & affect appropriate.   Data Reviewed: I have personally reviewed following labs and imaging studies  CBC: Recent Labs  Lab 01/09/19 0330 01/11/19 0249 01/14/19 0547  WBC 13.2* 13.3* 14.2*  NEUTROABS 8.5* 8.7* 9.3*  HGB 9.8*  10.7* 11.7*  HCT 31.3* 34.0* 38.3*  MCV 85.8 86.7 89.9  PLT 630* 554* 211*   Basic Metabolic Panel: Recent Labs  Lab 01/09/19 0330 01/11/19 0249 01/14/19 1028  NA 138 140 139  K 4.3 4.2 4.3  CL 107 109 110  CO2 19* 20* 17*  GLUCOSE 143* 150* 222*  BUN 127* 101* 98*  CREATININE 4.42* 4.53* 4.45*  CALCIUM 9.0 9.3 9.7  MG 2.1 2.1  --   PHOS 5.8* 5.4*  --    GFR: Estimated Creatinine Clearance: 13 mL/min (A)  (by C-G formula based on SCr of 4.45 mg/dL (H)). Liver Function Tests: Recent Labs  Lab 01/09/19 0330 01/11/19 0249  ALBUMIN 2.7* 3.0*   No results for input(s): LIPASE, AMYLASE in the last 168 hours. No results for input(s): AMMONIA in the last 168 hours. Coagulation Profile: No results for input(s): INR, PROTIME in the last 168 hours. Cardiac Enzymes: No results for input(s): CKTOTAL, CKMB, CKMBINDEX, TROPONINI in the last 168 hours. BNP (last 3 results) No results for input(s): PROBNP in the last 8760 hours. HbA1C: No results for input(s): HGBA1C in the last 72 hours. CBG: Recent Labs  Lab 01/14/19 1209 01/14/19 1700 01/14/19 1738 01/14/19 2143 01/15/19 0801  GLUCAP 224* 57* 81 228* 245*   Lipid Profile: No results for input(s): CHOL, HDL, LDLCALC, TRIG, CHOLHDL, LDLDIRECT in the last 72 hours. Thyroid Function Tests: No results for input(s): TSH, T4TOTAL, FREET4, T3FREE, THYROIDAB in the last 72 hours. Anemia Panel: No results for input(s): VITAMINB12, FOLATE, FERRITIN, TIBC, IRON, RETICCTPCT in the last 72 hours. Sepsis Labs: No results for input(s): PROCALCITON, LATICACIDVEN in the last 168 hours.  Recent Results (from the past 240 hour(s))  Culture, Urine     Status: Abnormal   Collection Time: 01/09/19  1:10 PM   Specimen: Urine, Catheterized  Result Value Ref Range Status   Specimen Description URINE, CATHETERIZED  Final   Special Requests   Final    NONE Performed at [facility], [address] [place], [place] [address]    Culture >=100,000 COLONIES/mL SERRATIA MARCESCENS (A)  Final   Report Status 01/12/2019 FINAL  Final   Organism ID, Bacteria SERRATIA MARCESCENS (A)  Final      Susceptibility   Serratia marcescens - MIC*    CEFAZOLIN >=64 RESISTANT Resistant     CEFTRIAXONE <=0.25 SENSITIVE Sensitive     CIPROFLOXACIN <=0.25 SENSITIVE Sensitive     GENTAMICIN <=1 SENSITIVE Sensitive     NITROFURANTOIN 256 RESISTANT Resistant      TRIMETH/SULFA <=20 SENSITIVE Sensitive     * >=100,000 COLONIES/mL SERRATIA MARCESCENS     Radiology Studies: No results found.   Scheduled Meds: . apixaban  5 mg Oral BID  . Chlorhexidine Gluconate Cloth  6 each Topical Q0600  . darbepoetin (ARANESP) injection - NON-DIALYSIS  60 mcg Subcutaneous Q Mon-1800  . feeding supplement (PRO-STAT SUGAR FREE 64)  30 mL Oral BID  . insulin aspart  0-15 Units Subcutaneous TID WC  . insulin aspart  0-5 Units Subcutaneous QHS  . insulin aspart  15 Units Subcutaneous TID WC  . insulin detemir  20 Units Subcutaneous Q24H  . mirtazapine  7.5 mg Oral QHS  . pantoprazole  40 mg Oral QHS   Continuous Infusions: . sodium chloride 10 mL/hr at 12/15/18 1400     LOS: 38 days   Time spent: [place], MD [facility] Pager [phone]  If 7PM-7AM, please contact night-coverage  [email or web address] Password [email or web address] 01/15/2019, 10:08 AM

## 2019-01-15 NOTE — Plan of Care (Signed)
  Problem: Education: Goal: Knowledge of General Education information will improve Description: Including pain rating scale, medication(s)/side effects and non-pharmacologic comfort measures Outcome: Progressing   Problem: Health Behavior/Discharge Planning: Goal: Ability to manage health-related needs will improve Outcome: Progressing   Problem: Clinical Measurements: Goal: Ability to maintain clinical measurements within normal limits will improve Outcome: Progressing Goal: Will remain free from infection Outcome: Progressing   Problem: Nutrition: Goal: Adequate nutrition will be maintained Outcome: Progressing   Problem: Elimination: Goal: Will not experience complications related to urinary retention Outcome: Progressing   Problem: Pain Managment: Goal: General experience of comfort will improve Outcome: Progressing   Problem: Safety: Goal: Ability to remain free from injury will improve Outcome: Progressing   Problem: Skin Integrity: Goal: Risk for impaired skin integrity will decrease Outcome: Progressing

## 2019-01-15 NOTE — Plan of Care (Signed)
  Problem: Activity: Goal: Risk for activity intolerance will decrease Outcome: Progressing   Problem: Nutrition: Goal: Adequate nutrition will be maintained Outcome: Progressing   Problem: Elimination: Goal: Will not experience complications related to bowel motility Outcome: Progressing Goal: Will not experience complications related to urinary retention Outcome: Progressing   Problem: Elimination: Goal: Will not experience complications related to urinary retention Outcome: Progressing   Problem: Pain Managment: Goal: General experience of comfort will improve Outcome: Progressing   Problem: Safety: Goal: Ability to remain free from injury will improve Outcome: Progressing   Problem: Skin Integrity: Goal: Risk for impaired skin integrity will decrease Outcome: Progressing   

## 2019-01-15 NOTE — Progress Notes (Signed)
Nutrition Follow-up  DOCUMENTATION CODES:   Underweight, Severe malnutrition in context of chronic illness  INTERVENTION:   -Continue 30 ml Prostat BID, each supplement provides 100 kcals and 15 grams protein -Continue double protein portions with meals -Continue MVI with minerals daily  NUTRITION DIAGNOSIS:   Severe Malnutrition related to chronic illness(uncontrolled DM, suspected gastroparesis) as evidenced by severe muscle depletion, severe fat depletion.  Ongoing  GOAL:   Patient will meet greater than or equal to 90% of their needs  Progressing   MONITOR:   PO intake, Supplement acceptance, Diet advancement  REASON FOR ASSESSMENT:   Consult Assessment of nutrition requirement/status  ASSESSMENT:   59 yo male admitted with AMS r/t severe DKA/HHS. PMH includes poorly controlled DM, severe PCM, gastroparesis, numerous psychiatric issues.  12/2- advanced to dysphagia 1 diet with thin liquids 12/4- advanced to dysphagia 2 diet with thin liquids 12/10- advanced to dysphagia 3 diet with carb mod restriction and thin liquids 12/28- diet downgraded to renal/ carb modified diet with 1.2 l fluid restriction  Reviewed I/O's: -1 L x 24 hours and -16.9 L since 01/01/19  UOP: 2.3 L x 24 hours  Pt remains with good appetite and is taking supplements well per MAR. Noted meal completion 25-100%.   Per MD, pt remains stable for discharge, but seeking most appropriate discharge disposition (SNF)  Lab Results  Component Value Date   HGBA1C 14.3 (H) 11/10/2018   PTA DM medications are .   Labs reviewed: CBGS: 57-245 (inpatient orders for glycemic control are 0-15 units insulin aspart TID with meals, 0-5 units insulin aspart q HS, 15 units insulin aspart TID with meals, and 10 units insulin detemir every 24 hours).   Diet Order:   Diet Order            Diet renal/carb modified with fluid restriction Diet-HS Snack? Nothing; Fluid restriction: 1200 mL Fluid; Room service  appropriate? Yes; Fluid consistency: Thin  Diet effective now              EDUCATION NEEDS:   Education needs have been addressed  Skin:  Skin Assessment: Skin Integrity Issues: Skin Integrity Issues:: Diabetic Ulcer, Stage II Stage II: lt and rt sacrum Diabetic Ulcer: lt great toe  Last BM:  01/11/18  Height:   Ht Readings from Last 1 Encounters:  12/08/18 6\' 3"  (1.905 m)    Weight:   Wt Readings from Last 1 Encounters:  12/09/18 50 kg    Ideal Body Weight:  89.1 kg  BMI:  Body mass index is 13.78 kg/m.  Estimated Nutritional Needs:   Kcal:  2000-2200  Protein:  110-125 grams  Fluid:  > 2 L    Ricci Dirocco A. Jimmye Norman, RD, LDN, [place] Registered Dietitian II Certified Diabetes Care and Education Specialist Pager: [phone] After hours Pager: [phone]

## 2019-01-16 LAB — CBC
HCT: 38.5 % — ABNORMAL LOW (ref 39.0–52.0)
Hemoglobin: 11.9 g/dL — ABNORMAL LOW (ref 13.0–17.0)
MCH: 27.2 pg (ref 26.0–34.0)
MCHC: 30.9 g/dL (ref 30.0–36.0)
MCV: 87.9 fL (ref 80.0–100.0)
Platelets: 400 10*3/uL (ref 150–400)
RBC: 4.38 MIL/uL (ref 4.22–5.81)
RDW: 21.4 % — ABNORMAL HIGH (ref 11.5–15.5)
WBC: 12.4 10*3/uL — ABNORMAL HIGH (ref 4.0–10.5)
nRBC: 0 % (ref 0.0–0.2)

## 2019-01-16 LAB — COMPREHENSIVE METABOLIC PANEL
ALT: 28 U/L (ref 0–44)
AST: 20 U/L (ref 15–41)
Albumin: 3 g/dL — ABNORMAL LOW (ref 3.5–5.0)
Alkaline Phosphatase: 99 U/L (ref 38–126)
Anion gap: 11 (ref 5–15)
BUN: 107 mg/dL — ABNORMAL HIGH (ref 6–20)
CO2: 18 mmol/L — ABNORMAL LOW (ref 22–32)
Calcium: 9.2 mg/dL (ref 8.9–10.3)
Chloride: 109 mmol/L (ref 98–111)
Creatinine, Ser: 4.81 mg/dL — ABNORMAL HIGH (ref 0.61–1.24)
GFR calc Af Amer: 14 mL/min — ABNORMAL LOW (ref >60)
GFR calc non Af Amer: 12 mL/min — ABNORMAL LOW (ref >60)
Glucose, Bld: 169 mg/dL — ABNORMAL HIGH (ref 70–99)
Potassium: 4.6 mmol/L (ref 3.5–5.1)
Sodium: 138 mmol/L (ref 135–145)
Total Bilirubin: 0.5 mg/dL (ref 0.3–1.2)
Total Protein: 8.7 g/dL — ABNORMAL HIGH (ref 6.5–8.1)

## 2019-01-16 LAB — GLUCOSE, CAPILLARY
Glucose-Capillary: 190 mg/dL — ABNORMAL HIGH (ref 70–99)
Glucose-Capillary: 93 mg/dL (ref 70–99)
Glucose-Capillary: 225 mg/dL — ABNORMAL HIGH (ref 70–99)
Glucose-Capillary: 173 mg/dL — ABNORMAL HIGH (ref 70–99)

## 2019-01-16 NOTE — Progress Notes (Signed)
[facility]  PROGRESS NOTE    JETSON PICKREL  DQQ:[id number] DOB: 1960/04/21 DOA: 12/08/2018 PCP: Marliss Coots, NP   Brief Narrative:   CSW spoke with pt brother [name] via telephone [phone]. CSW reintroduced self, role, reason for call. We discussed pt progress and barriers to placement at this time. Pt is ambulating with min assist-supervision level. He does continue to need support with medications/foley etc. We discussed that currently there are no placement options due to pending Medicaid and disability applications as well as COVID closing down many SNFs to admissions. CSW asked if [name] would be willing to do education with [name] and our nursing staff here around what medications Macari takes and assist as needed. [name] states he is helping his mother with these things and Keith can go home to his mothers home. CSW will need to discuss with RN staff if we can provide education to [place] if he comes to hospital. CSW also spoke with Nat Christen, LCSW with [facility] to assess what was occurring at pt home at this time.  01/16/19: No acute events ON.    Assessment & Plan:   Active Problems:   Type 2 diabetes mellitus with stage 3 chronic kidney disease (HCC)   Sepsis ([place])   Essential hypertension   Acute renal failure superimposed on chronic kidney disease (HCC)   DKA, type 1, not at goal [facility])   Protein-calorie malnutrition, severe   Acute lower UTI   Bacteremia due to Gram-negative bacteria   Bilateral hydronephrosis   DVT (deep venous thrombosis) (HCC)   Metabolic acidosis   Anemia due to chronic kidney disease   Pressure injury of skin   Hyperkalemia   Hyperglycemia   Pyelonephritis  Diabetes mellitus type 2, DKA resolved:     - DKA resolved, monitoring     - on levemir and SSI meal coverage  AKI on CKD stage 4, resolved AKI:     - SCr 4.81     - BUN is still high, watch for uremia symptoms     - catheter in place for obstructive uropathy     - no current antibiotics     -  watch nephrotoxins  Severe protein calorie malnutrition:     - per dietician  Anemia associated with CKD:     - aranesp weekly     - stable Hgb, monitor  Hx of DVT:     - noted recent but no date on H/P      - continue apixaban BID  DVT prophylaxis: eliquis Code Status: FULL Family Communication: None at bedside   Disposition Plan: To SNF when available  Consultants:   Nephrology  Urology  Oncology   ROS:  Denies CP, N, V, ab pain . Remainder 10-pt ROS is negative for all not previously mentioned.  Subjective: No acute events ON.   Objective: Vitals:   01/15/19 0800 01/15/19 1400 01/15/19 2109 01/16/19 1619  BP: 116/90 104/75 120/85 104/81  Pulse: (!) 106 (!) 111 (!) 102 98  Resp: 16 18 17 18   Temp: 98.5 F (36.9 C) 98.2 F (36.8 C) 98 F (36.7 C) 98.8 F (37.1 C)  TempSrc: Oral Oral Oral Oral  SpO2: 100% 100% 100% 100%  Weight:      Height:        Intake/Output Summary (Last 24 hours) at 01/16/2019 1638 Last data filed at 01/16/2019 0733 Gross per 24 hour  Intake 582 ml  Output 1050 ml  Net -468 ml   Filed  Weights   12/08/18 1200 12/08/18 2050 12/09/18 0300  Weight: 86.9 kg 60.8 kg 50 kg    Examination:  General: 58 y.o. male resting in bed in NAD Cardiovascular: RRR, +S1, S2, no m/g/r, equal pulses throughout Respiratory: CTABL, no w/r/r, normal WOB GI: BS+, NDNT, no masses noted, no organomegaly noted MSK: No e/c/c Neuro: alert to name, follows commands Psyc: calm/cooperative   Data Reviewed: I have personally reviewed following labs and imaging studies.  CBC: Recent Labs  Lab 01/11/19 0249 01/14/19 0547 01/16/19 0221  WBC 13.3* 14.2* 12.4*  NEUTROABS 8.7* 9.3*  --   HGB 10.7* 11.7* 11.9*  HCT 34.0* 38.3* 38.5*  MCV 86.7 89.9 87.9  PLT 554* 424* 481   Basic Metabolic Panel: Recent Labs  Lab 01/11/19 0249 01/14/19 1028 01/16/19 0221  NA 140 139 138  K 4.2 4.3 4.6  CL 109 110 109  CO2 20* 17* 18*  GLUCOSE 150* 222* 169*   BUN 101* 98* 107*  CREATININE 4.53* 4.45* 4.81*  CALCIUM 9.3 9.7 9.2  MG 2.1  --   --   PHOS 5.4*  --   --    GFR: Estimated Creatinine Clearance: 12 mL/min (A) (by C-G formula based on SCr of 4.81 mg/dL (H)). Liver Function Tests: Recent Labs  Lab 01/11/19 0249 01/16/19 0221  AST  --  20  ALT  --  28  ALKPHOS  --  99  BILITOT  --  0.5  PROT  --  8.7*  ALBUMIN 3.0* 3.0*   No results for input(s): LIPASE, AMYLASE in the last 168 hours. No results for input(s): AMMONIA in the last 168 hours. Coagulation Profile: No results for input(s): INR, PROTIME in the last 168 hours. Cardiac Enzymes: No results for input(s): CKTOTAL, CKMB, CKMBINDEX, TROPONINI in the last 168 hours. BNP (last 3 results) No results for input(s): PROBNP in the last 8760 hours. HbA1C: No results for input(s): HGBA1C in the last 72 hours. CBG: Recent Labs  Lab 01/15/19 1213 01/15/19 1710 01/15/19 2104 01/16/19 0731 01/16/19 1154  GLUCAP 74 247* 107* 190* 93   Lipid Profile: No results for input(s): CHOL, HDL, LDLCALC, TRIG, CHOLHDL, LDLDIRECT in the last 72 hours. Thyroid Function Tests: No results for input(s): TSH, T4TOTAL, FREET4, T3FREE, THYROIDAB in the last 72 hours. Anemia Panel: No results for input(s): VITAMINB12, FOLATE, FERRITIN, TIBC, IRON, RETICCTPCT in the last 72 hours. Sepsis Labs: No results for input(s): PROCALCITON, LATICACIDVEN in the last 168 hours.  Recent Results (from the past 240 hour(s))  Culture, Urine     Status: Abnormal   Collection Time: 01/09/19  1:10 PM   Specimen: Urine, Catheterized  Result Value Ref Range Status   Specimen Description URINE, CATHETERIZED  Final   Special Requests   Final    NONE Performed at [facility], [address], [place], [place] [address]    Culture >=100,000 COLONIES/mL SERRATIA MARCESCENS (A)  Final   Report Status 01/12/2019 FINAL  Final   Organism ID, Bacteria SERRATIA MARCESCENS (A)  Final      Susceptibility    Serratia marcescens - MIC*    CEFAZOLIN >=64 RESISTANT Resistant     CEFTRIAXONE <=0.25 SENSITIVE Sensitive     CIPROFLOXACIN <=0.25 SENSITIVE Sensitive     GENTAMICIN <=1 SENSITIVE Sensitive     NITROFURANTOIN 256 RESISTANT Resistant     TRIMETH/SULFA <=20 SENSITIVE Sensitive     * >=100,000 COLONIES/mL SERRATIA MARCESCENS      Radiology Studies: No results  found.   Scheduled Meds: . apixaban  5 mg Oral BID  . Chlorhexidine Gluconate Cloth  6 each Topical Q0600  . darbepoetin (ARANESP) injection - NON-DIALYSIS  60 mcg Subcutaneous Q Mon-1800  . feeding supplement (PRO-STAT SUGAR FREE 64)  30 mL Oral BID  . insulin aspart  0-15 Units Subcutaneous TID WC  . insulin aspart  0-5 Units Subcutaneous QHS  . insulin aspart  15 Units Subcutaneous TID WC  . insulin detemir  10 Units Subcutaneous Q24H  . mirtazapine  7.5 mg Oral QHS  . pantoprazole  40 mg Oral QHS   Continuous Infusions: . sodium chloride 10 mL/hr at 12/15/18 1400     LOS: 39 days    Time spent: 25 minutes spent in the coordination of care today.    Jonnie Finner, DO [facility]  If 7PM-7AM, please contact night-coverage [email or web address] 01/16/2019, 4:38 PM

## 2019-01-16 NOTE — TOC Progression Note (Signed)
Transition of Care [facility]) - Progression Note    Patient Details  Name: Timothy Madden MRN: [id number] Date of Birth: 1960-12-24  Transition of Care [facility]) CM/SW [place], [place] Phone Number: 01/16/2019, 12:51 PM  Clinical Narrative:    CSW spoke with pt brother [name] via telephone [phone]. CSW reintroduced self, role, reason for call. We discussed pt progress and barriers to placement at this time. Pt is ambulating with min assist-supervision level. He does continue to need support with medications/foley etc. We discussed that currently there are no placement options due to pending Medicaid and disability applications as well as COVID closing down many SNFs to admissions. CSW asked if [name] would be willing to do education with [name] and our nursing staff here around what medications Abner takes and assist as needed. [name] states he is helping his mother with these things and Derico can go home to his mothers home. CSW will need to discuss with RN staff if we can provide education to [place] if he comes to hospital. CSW also spoke with Nat Christen, LCSW with [facility] to assess what was occurring at pt home at this time.   TOC team continuing to follow.    Expected Discharge Plan: Home/Self Care Barriers to Discharge: Continued Medical Work up, Barriers Unresolved (comment)(still working on supports at home/in community.)  Expected Discharge Plan and Services Expected Discharge Plan: Home/Self Care In-house Referral: Clinical Social Work Discharge Planning Services: CM Consult, [employer], Medication Assistance Post Acute Care Choice: Durable Medical Equipment, Home Health Living arrangements for the past 2 months: Apartment  Readmission Risk Interventions Readmission Risk Prevention Plan 11/14/2018  Transportation Screening Complete  PCP or Specialist Appt within 3-5 Days Not Complete  Not Complete comments Clinics to which patient goes do not make  appointments-it is walk in.  [place] or Home Care Consult Complete  Social Work Consult for [place] Planning/Counseling Complete  Palliative Care Screening Not Applicable  Medication Review (RN Care Manager) Referral to Pharmacy  Some recent data might be hidden

## 2019-01-16 NOTE — Progress Notes (Signed)
Physical Therapy Treatment Patient Details Name: Timothy Madden MRN: [id number] DOB: 1961/01/03 Today's Date: 01/16/2019    History of Present Illness This is a 59 year old man with a history of numerous psychiatric issues, diabetes poorly controlled supposed to be on home insulin presenting with obtundation from home found to be in severe DKA/HOCM.  PMH DKA, DM, L second finger amputation.    PT Comments    Continuing work on functional mobility and activity tolerance;  Given Mr. Mcandrew has progressed so well with the RW, opted to use a single point cane today, and he seemed pleased; Slwer gait with occasional losses of balance -- definitely a challenge to his stability; after walking, we worked on single limb static stance with and without UE support; Have updated goals to be balance focused, and will start balance therapeutic exercises soon.  Follow Up Recommendations  SNF     Equipment Recommendations  Rolling walker with 5" wheels    Recommendations for Other Services       Precautions / Restrictions Precautions Precautions: Fall Precaution Comments: fall risk greatly decr withuse of RW    Mobility  Bed Mobility Overal bed mobility: Modified Independent Bed Mobility: Supine to Sit     Supine to sit: Modified independent (Device/Increase time)        Transfers Overall transfer level: Needs assistance Equipment used: Straight cane Transfers: Sit to/from Stand Sit to Stand: Supervision         General transfer comment: Supervision for safety  Ambulation/Gait Ambulation/Gait assistance: Min guard;Min assist(with and without physical contact) Gait Distance (Feet): 175 Feet Assistive device: Straight cane Gait Pattern/deviations: Decreased step length - right;Decreased step length - left;Decreased stride length Gait velocity: decreased   General Gait Details: Close [place]ingu[place] for safety, occasional small losses of balance through out walk today, needing min  assist to steady   Stairs             Wheelchair Mobility    Modified Rankin (Stroke Patients Only)       Balance     Sitting balance-Leahy Scale: Good       Standing balance-Leahy Scale: Fair   Single Leg Stance - Right Leg: 3 Single Leg Stance - Left Leg: 2                        Cognition Arousal/Alertness: Awake/alert Behavior During Therapy: Flat affect Overall Cognitive Status: No family/caregiver present to determine baseline cognitive functioning                                 General Comments: Answers "yes" to almost everything. Did answer a few questions with multi- word responses.      Exercises      General Comments        Pertinent Vitals/Pain Pain Assessment: No/denies pain    Home Living                      Prior Function            PT Goals (current goals can now be found in the care plan section) Acute Rehab PT Goals Patient Stated Goal: did not state, but agreeable to walk PT Goal Formulation: With patient Time For Goal Achievement: 01/30/19 Potential to Achieve Goals: Fair Additional Goals Additional Goal #1: Pt will score greater than or equal to 49/56 on Ber Balance Assessment to demonstrate  decr fall risk. Progress towards PT goals: Goals met and updated - see care plan    Frequency    Min 2X/week      PT Plan Current plan remains appropriate    Co-evaluation              AM-PAC PT "6 Clicks" Mobility   Outcome Measure  Help needed turning from your back to your side while in a flat bed without using bedrails?: None Help needed moving from lying on your back to sitting on the side of a flat bed without using bedrails?: None Help needed moving to and from a bed to a chair (including a wheelchair)?: None Help needed standing up from a chair using your arms (e.g., wheelchair or bedside chair)?: A Little Help needed to walk in hospital room?: A Little Help needed climbing  3-5 steps with a railing? : A Little 6 Click Score: 21    End of Session Equipment Utilized During Treatment: Gait belt Activity Tolerance: Patient tolerated treatment well Patient left: in chair;with call bell/phone within reach;with chair alarm set Nurse Communication: Mobility status PT Visit Diagnosis: Other abnormalities of gait and mobility (R26.89);Muscle weakness (generalized) (M62.81);Other symptoms and signs involving the nervous system (U38.333)     Time: 8329-1916 PT Time Calculation (min) (ACUTE ONLY): 12 min  Charges:  $Gait Training: 8-22 mins                     Roney Marion, [place] Pager [phone] Office [place] 01/16/2019, 11:40 AM

## 2019-01-16 NOTE — Plan of Care (Signed)
Physical Therapy Note  Making good progress with mobility and ambulation with RW;  Majority of goals met;  Updating goals to include focus on balance and decreasing fall risk;   Roney Marion, [place] Pager [phone] Office [phone]

## 2019-01-17 LAB — GLUCOSE, CAPILLARY
Glucose-Capillary: 248 mg/dL — ABNORMAL HIGH (ref 70–99)
Glucose-Capillary: 76 mg/dL (ref 70–99)
Glucose-Capillary: 204 mg/dL — ABNORMAL HIGH (ref 70–99)
Glucose-Capillary: 218 mg/dL — ABNORMAL HIGH (ref 70–99)
Glucose-Capillary: 105 mg/dL — ABNORMAL HIGH (ref 70–99)
Glucose-Capillary: 69 mg/dL — ABNORMAL LOW (ref 70–99)

## 2019-01-17 MED ORDER — GLUCOSE 40 % PO GEL
ORAL | Status: AC
  Filled 2019-01-17: qty 1

## 2019-01-17 MED ORDER — GLUCOSE 40 % PO GEL
1.0000 | ORAL | Status: AC
  Administered 2019-01-17: 21:00:00 37.5 g via ORAL

## 2019-01-17 NOTE — Progress Notes (Signed)
Hypoglycemic Event  CBG: 69  Treatment: 1 tube glucose gel  Symptoms: None  Follow-up CBG: Time:2121 CBG Result:105  Possible Reasons for Event: Unknown  Comments/MD notified:none    [name], Timothy Madden

## 2019-01-17 NOTE — TOC Progression Note (Signed)
Transition of Care [facility]) - Progression Note    Patient Details  Name: Timothy Madden MRN: [id number] Date of Birth: Dec 03, 1960  Transition of Care [facility] CM/SW [place], [place] Phone Number: 01/17/2019, 11:01 AM  Clinical Narrative:    CSW spoke with pt at bedside, we discussed placement barriers, pt amenable to going to his mothers home. He is aware [name] will come and do some teaching with pt at bedside about medications. He needs a walker at home but states he is okay to use the bathroom there and declines a commode. Pt states that he is open to APS being called for supportive services- will make report.  CSW then called pt brother [name]. [name] states he cannot come today for education but can come tomorrow at 10am. CSW to f/u first thing for pt brother to come by. TOC team to make referral for charity DME and HH. Plan for discharge back to pt mothers home tomorrow due to barriers with insurance and lack of skilled therapy needs at this time; complicated by COVID 19 pandemic.    Expected Discharge Plan: Home/Self Care Barriers to Discharge: Continued Medical Work up, Barriers Unresolved (comment)(still working on supports at home/in community.)  Expected Discharge Plan and Services Expected Discharge Plan: Home/Self Care In-house Referral: Clinical Social Work Discharge Planning Services: CM Consult, [employer], Medication Assistance Post Acute Care Choice: Durable Medical Equipment, Home Health Living arrangements for the past 2 months: Apartment  Readmission Risk Interventions Readmission Risk Prevention Plan 11/14/2018  Transportation Screening Complete  PCP or Specialist Appt within 3-5 Days Not Complete  Not Complete comments Clinics to which patient goes do not make appointments-it is walk in.  [place] or Home Care Consult Complete  Social Work Consult for [place] Planning/Counseling Complete  Palliative Care Screening Not Applicable  Medication  Review (RN Care Manager) Referral to Pharmacy  Some recent data might be hidden

## 2019-01-17 NOTE — Progress Notes (Signed)
Timothy Madden  PROGRESS NOTE    Timothy Madden  XAJ:[id number] DOB: 1960-12-06 DOA: 12/08/2018 PCP: Timothy Coots, NP   Brief Narrative:   CSW spoke with pt brother Timothy Madden via telephone [phone]. CSW reintroduced self, role, reason for call. We discussed pt progress and barriers to placement at this time. Pt is ambulating with min assist-supervision level. He does continue to need support with medications/foley etc. We discussed that currently there are no placement options due to pending Medicaid and disability applications as well as COVID closing down many SNFs to admissions. CSW asked if Timothy Madden would be willing to do education with Timothy Madden and our nursing staff here around what medications Timothy Madden takes and assist as needed. Timothy Madden states he is helping his mother with these things and Timothy Madden can go home to his mothers home. CSW will need to discuss with RN staff if we can provide education to Timothy Madden if he comes to hospital. CSW also spoke with Timothy Christen, LCSW with [facility] to assess what was occurring at pt home at this time.  01/17/19: Denies complaints today. A little more bright and conversant today.   Assessment & Plan:   Active Problems:   Type 2 diabetes mellitus with stage 3 chronic kidney disease (HCC)   Sepsis ([place])   Essential hypertension   Acute renal failure superimposed on chronic kidney disease (HCC)   DKA, type 1, not at goal [facility])   Protein-calorie malnutrition, severe   Acute lower UTI   Bacteremia due to Gram-negative bacteria   Bilateral hydronephrosis   DVT (deep venous thrombosis) (HCC)   Metabolic acidosis   Anemia due to chronic kidney disease   Pressure injury of skin   Hyperkalemia   Hyperglycemia   Pyelonephritis  Diabetes mellitus type 2, DKA resolved:     - DKA resolved, monitoring     - on levemir and SSI meal coverage     - 01/17/19: glucoses are all over the place, adjust insulin  AKI on CKD stage 4, resolved AKI:     - AM labs ordered, monitor     - BUN is  still high, watch for uremia symptoms     - catheter in place for obstructive uropathy     - no current antibiotics     - watch nephrotoxins  Severe protein calorie malnutrition:     - per dietician  Anemia associated with CKD:     - aranesp weekly     - stable Hgb, monitor  Hx of DVT:     - noted recent but no date on H/P      - continue apixaban BID  DVT prophylaxis: eliquis Code Status: FULL Family Communication: None at beside   Disposition Plan: To home with family tomorrow?  Consultants:   Nephrology  Urology  Oncology  ROS:  Denies dyspnea, ab pain, CP, N, V, D. Remainder 10-pt ROS is negative for all not previously mentioned.  Subjective: "That'll be fine"  Objective: Vitals:   01/15/19 2109 01/16/19 1619 01/16/19 2223 01/17/19 0621  BP: 120/85 104/81 106/73 101/77  Pulse: (!) 102 98 99 99  Resp: 17 18 18 18   Temp: 98 F (36.7 C) 98.8 F (37.1 C) 98.1 F (36.7 C) 98.6 F (37 C)  TempSrc: Oral Oral Oral Oral  SpO2: 100% 100% 97% 100%  Weight:      Height:        Intake/Output Summary (Last 24 hours) at 01/17/2019 1517 Last data filed at 01/17/2019 6629 Gross  per 24 hour  Intake 480 ml  Output 1750 ml  Net -1270 ml   Filed Weights   12/08/18 1200 12/08/18 2050 12/09/18 0300  Weight: 86.9 kg 60.8 kg 50 kg    Examination:  General: 59 y.o. male resting in bed in NAD Cardiovascular: RRR, +S1, S2, no m/g/r Respiratory: CTABL, no w/r/r GI: BS+, NDNT, no masses noted MSK: No e/c/c Neuro: Alert to name, follows commands Psyc: Appropriate interaction and affect, calm/cooperative   Data Reviewed: I have personally reviewed following labs and imaging studies.  CBC: Recent Labs  Lab 01/11/19 0249 01/14/19 0547 01/16/19 0221  WBC 13.3* 14.2* 12.4*  NEUTROABS 8.7* 9.3*  --   HGB 10.7* 11.7* 11.9*  HCT 34.0* 38.3* 38.5*  MCV 86.7 89.9 87.9  PLT 554* 424* 778   Basic Metabolic Panel: Recent Labs  Lab 01/11/19 0249 01/14/19 1028  01/16/19 0221  NA 140 139 138  K 4.2 4.3 4.6  CL 109 110 109  CO2 20* 17* 18*  GLUCOSE 150* 222* 169*  BUN 101* 98* 107*  CREATININE 4.53* 4.45* 4.81*  CALCIUM 9.3 9.7 9.2  MG 2.1  --   --   PHOS 5.4*  --   --    GFR: Estimated Creatinine Clearance: 12 mL/min (A) (by C-G formula based on SCr of 4.81 mg/dL (H)). Liver Function Tests: Recent Labs  Lab 01/11/19 0249 01/16/19 0221  AST  --  20  ALT  --  28  ALKPHOS  --  99  BILITOT  --  0.5  PROT  --  8.7*  ALBUMIN 3.0* 3.0*   No results for input(s): LIPASE, AMYLASE in the last 168 hours. No results for input(s): AMMONIA in the last 168 hours. Coagulation Profile: No results for input(s): INR, PROTIME in the last 168 hours. Cardiac Enzymes: No results for input(s): CKTOTAL, CKMB, CKMBINDEX, TROPONINI in the last 168 hours. BNP (last 3 results) No results for input(s): PROBNP in the last 8760 hours. HbA1C: No results for input(s): HGBA1C in the last 72 hours. CBG: Recent Labs  Lab 01/16/19 1154 01/16/19 1651 01/16/19 2150 01/17/19 0756 01/17/19 1200  GLUCAP 93 225* 173* 248* 76   Lipid Profile: No results for input(s): CHOL, HDL, LDLCALC, TRIG, CHOLHDL, LDLDIRECT in the last 72 hours. Thyroid Function Tests: No results for input(s): TSH, T4TOTAL, FREET4, T3FREE, THYROIDAB in the last 72 hours. Anemia Panel: No results for input(s): VITAMINB12, FOLATE, FERRITIN, TIBC, IRON, RETICCTPCT in the last 72 hours. Sepsis Labs: No results for input(s): PROCALCITON, LATICACIDVEN in the last 168 hours.  Recent Results (from the past 240 hour(s))  Culture, Urine     Status: Abnormal   Collection Time: 01/09/19  1:10 PM   Specimen: Urine, Catheterized  Result Value Ref Range Status   Specimen Description URINE, CATHETERIZED  Final   Special Requests   Final    NONE Performed at [facility], [address] [place], [place] [address]    Culture >=100,000 COLONIES/mL SERRATIA MARCESCENS (A)  Final   Report Status  01/12/2019 FINAL  Final   Organism ID, Bacteria SERRATIA MARCESCENS (A)  Final      Susceptibility   Serratia marcescens - MIC*    CEFAZOLIN >=64 RESISTANT Resistant     CEFTRIAXONE <=0.25 SENSITIVE Sensitive     CIPROFLOXACIN <=0.25 SENSITIVE Sensitive     GENTAMICIN <=1 SENSITIVE Sensitive     NITROFURANTOIN 256 RESISTANT Resistant     TRIMETH/SULFA <=20 SENSITIVE Sensitive     * >=  100,000 [place]      Radiology Studies: No results found.   Scheduled Meds: . apixaban  5 mg Oral BID  . Chlorhexidine Gluconate Cloth  6 each Topical Q0600  . darbepoetin (ARANESP) injection - NON-DIALYSIS  60 mcg Subcutaneous Q Mon-1800  . feeding supplement (PRO-STAT SUGAR FREE 64)  30 mL Oral BID  . insulin aspart  0-15 Units Subcutaneous TID WC  . insulin aspart  0-5 Units Subcutaneous QHS  . insulin aspart  15 Units Subcutaneous TID WC  . insulin detemir  10 Units Subcutaneous Q24H  . mirtazapine  7.5 mg Oral QHS  . pantoprazole  40 mg Oral QHS   Continuous Infusions: . sodium chloride 10 mL/hr at 12/15/18 1400     LOS: 40 days    Time spent: 25 minutes spent in the coordination of care today.    Jonnie Finner, DO [facility]  If 7PM-7AM, please contact night-coverage [email or web address] 01/17/2019, 3:17 PM

## 2019-01-17 NOTE — TOC Initial Note (Addendum)
Transition of Care [facility]) - Initial/Assessment Note    Patient Details  Name: Timothy Madden MRN: [id number] Date of Birth: 1960/04/06  [facility] CM/SW Contact:    Marilu Favre, RN Phone Number: 01/17/2019, 11:42 AM  Clinical Narrative:                  See previous TOC Team note . Spoke to patient at bedside.   Referral for home health RN,PT,SW given to [facility] with [place], she will see if patient qualifies for charity home health. [facility] has accepted order for [facility]  Ordered walker with Zack with [facility].    Potential discharge tomorrow will assist with prescriptions.   Patient has PCP.  Expected Discharge Plan: [place] Barriers to Discharge: Continued Medical Work up, Barriers Unresolved (comment)(still working on supports at home/in community.)   Patient Goals and CMS Choice Patient states their goals for this hospitalization and ongoing recovery are:: to go home CMS Medicare.gov Compare Post Acute Care list provided to:: Patient Choice offered to / list presented to : Patient  Expected Discharge Plan and Services Expected Discharge Plan: [place] In-house Referral: Clinical Social Work Discharge Planning Services: [facility], [place] [facility], Medication Assistance Post Acute Care Choice: Home Health, Durable Medical Equipment Living arrangements for the past 2 months: Apartment                 DME Arranged: Walker rolling DME Agency: [employer] Date DME Agency Contacted: 01/17/19 Time DME Agency Contacted: 54 Representative spoke with at DME Agency: Zack HH Arranged: PT, RN, Social Work [employer] Agency: [place] ([place]) Date [place]: 01/17/19 Time [place]: 29 Representative spoke with at [place]: [place] Arrangements/Services Living arrangements for the past 2 months: [place] with:: Parents, Relatives Patient  language and need for interpreter reviewed:: Yes Do you feel safe going back to the place where you live?: Yes      Need for Family Participation in Patient Care: Yes (Comment) Care giver support system in place?: Yes (comment) Current home services: DME(chronic foley) Criminal Activity/Legal Involvement Pertinent to Current Situation/Hospitalization: No - Comment as needed  Activities of Daily Living Home Assistive Devices/Equipment: None ADL Screening (condition at time of admission) Patient's cognitive ability adequate to safely complete daily activities?: Yes Is the patient deaf or have difficulty hearing?: No Does the patient have difficulty seeing, even when wearing glasses/contacts?: No Does the patient have difficulty concentrating, remembering, or making decisions?: No Patient able to express need for assistance with ADLs?: Yes Does the patient have difficulty dressing or bathing?: No Independently performs ADLs?: Yes (appropriate for developmental age) Does the patient have difficulty walking or climbing stairs?: Yes Weakness of Legs: Both Weakness of Arms/Hands: None  Permission Sought/Granted Permission sought to share information with : Family Supports Permission granted to share information with : Yes, Verbal Permission Granted  Share Information with NAME: Ervin [phone]  Permission granted to share info w AGENCY: [place] granted to share info w Relationship: brother  Permission granted to share info w Contact Information: [phone]  Emotional Assessment Appearance:: Appears stated age Attitude/Demeanor/Rapport: Engaged Affect (typically observed): Accepting Orientation: : Oriented to Self, Oriented to Place, Oriented to Situation, Fluctuating Orientation (Suspected and/or reported [place]) Alcohol / Substance Use: Tobacco Use Psych Involvement: No (comment)  Admission diagnosis:  Hyperkalemia [E87.5] Pyelonephritis  [N12] Hyperglycemia [R73.9] Sepsis, due  to unspecified organism, unspecified whether acute organ dysfunction present [facility]) [A41.9] Patient Active Problem List   Diagnosis Date Noted  . Hyperkalemia   . Hyperglycemia   . Pyelonephritis   . Pressure injury of skin 12/15/2018  . Protein-calorie malnutrition, severe 12/11/2018  . Acute lower UTI 12/11/2018  . Bacteremia due to Gram-negative bacteria 12/11/2018  . Bilateral hydronephrosis 12/11/2018  . DVT (deep venous thrombosis) ([facility]) 12/11/2018  . Metabolic acidosis 56/38/7564  . Anemia due to chronic kidney disease 12/11/2018  . DKA, type 1, not at goal [facility]) 12/08/2018  . 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 Acute stress disorder 11/14/2018  . Leg swelling 11/10/2018  . DKA, type 2 ([place]) 06/28/2018  . Acute renal failure superimposed on chronic kidney disease ([place]) 06/28/2018  . Elevated lipase 06/28/2018  . Osteomyelitis of finger of left hand ([place]) 10/26/2013  . Osteomyelitis ([place]) 10/26/2013  . Sepsis ([place]) 10/26/2013  . AKI (acute kidney injury) ([place]) 10/26/2013  . Hyponatremia 10/26/2013  . Type I diabetes mellitus with complication, uncontrolled ([place]) 10/26/2013  . Essential hypertension 10/26/2013  . Anemia of chronic disease 10/26/2013  . PSYCHIATRIC DISORDER 07/19/2006  . TOBACCO ABUSE 07/19/2006  . Type 2 diabetes mellitus with stage 3 chronic kidney disease ([place]) 11/30/2005  . ANXIETY 11/30/2005   PCP:  Marliss Coots, NP Pharmacy:   [facility] [place]), [place] - [address] [place] [address] Phone: [phone] Fax: [place], [place] - [address] [place] [address] Phone: [phone] Fax: [phone]     Social Determinants of Health ([place]) Interventions    Readmission Risk Interventions Readmission Risk Prevention Plan 11/14/2018  Transportation Screening  Complete  PCP or Specialist Appt within 3-5 Days Not Complete  Not Complete comments Clinics to which patient goes do not make appointments-it is walk in.  [place] or Home Care Consult Complete  Social Work Consult for [place] Planning/Counseling Complete  Palliative Care Screening Not Applicable  Medication Review (RN Care Manager) Referral to Pharmacy  Some recent data might be hidden

## 2019-01-18 DIAGNOSIS — I824Y1 Acute embolism and thrombosis of unspecified deep veins of right proximal lower extremity: Secondary | ICD-10-CM

## 2019-01-18 LAB — RENAL FUNCTION PANEL
Albumin: 3.2 g/dL — ABNORMAL LOW (ref 3.5–5.0)
Anion gap: 12 (ref 5–15)
BUN: 115 mg/dL — ABNORMAL HIGH (ref 6–20)
CO2: 18 mmol/L — ABNORMAL LOW (ref 22–32)
Calcium: 9.2 mg/dL (ref 8.9–10.3)
Chloride: 108 mmol/L (ref 98–111)
Creatinine, Ser: 4.54 mg/dL — ABNORMAL HIGH (ref 0.61–1.24)
GFR calc Af Amer: 15 mL/min — ABNORMAL LOW (ref >60)
GFR calc non Af Amer: 13 mL/min — ABNORMAL LOW (ref >60)
Glucose, Bld: 159 mg/dL — ABNORMAL HIGH (ref 70–99)
Phosphorus: 6.2 mg/dL — ABNORMAL HIGH (ref 2.5–4.6)
Potassium: 4.7 mmol/L (ref 3.5–5.1)
Sodium: 138 mmol/L (ref 135–145)

## 2019-01-18 LAB — CBC WITH DIFFERENTIAL/PLATELET
Abs Immature Granulocytes: 0.05 K/uL (ref 0.00–0.07)
Basophils Absolute: 0.1 K/uL (ref 0.0–0.1)
Basophils Relative: 1 %
Eosinophils Absolute: 0.2 K/uL (ref 0.0–0.5)
Eosinophils Relative: 1 %
HCT: 37.6 % — ABNORMAL LOW (ref 39.0–52.0)
Hemoglobin: 11.5 g/dL — ABNORMAL LOW (ref 13.0–17.0)
Immature Granulocytes: 0 %
Lymphocytes Relative: 16 %
Lymphs Abs: 1.8 K/uL (ref 0.7–4.0)
MCH: 27.3 pg (ref 26.0–34.0)
MCHC: 30.6 g/dL (ref 30.0–36.0)
MCV: 89.1 fL (ref 80.0–100.0)
Monocytes Absolute: 1 K/uL (ref 0.1–1.0)
Monocytes Relative: 8 %
Neutro Abs: 8.6 K/uL — ABNORMAL HIGH (ref 1.7–7.7)
Neutrophils Relative %: 74 %
Platelets: 395 K/uL (ref 150–400)
RBC: 4.22 MIL/uL (ref 4.22–5.81)
RDW: 20.9 % — ABNORMAL HIGH (ref 11.5–15.5)
WBC: 11.7 K/uL — ABNORMAL HIGH (ref 4.0–10.5)
nRBC: 0 % (ref 0.0–0.2)

## 2019-01-18 LAB — GLUCOSE, CAPILLARY
Glucose-Capillary: 175 mg/dL — ABNORMAL HIGH (ref 70–99)
Glucose-Capillary: 65 mg/dL — ABNORMAL LOW (ref 70–99)
Glucose-Capillary: 148 mg/dL — ABNORMAL HIGH (ref 70–99)
Glucose-Capillary: 265 mg/dL — ABNORMAL HIGH (ref 70–99)
Glucose-Capillary: 149 mg/dL — ABNORMAL HIGH (ref 70–99)

## 2019-01-18 LAB — MAGNESIUM: Magnesium: 2.3 mg/dL (ref 1.7–2.4)

## 2019-01-18 MED ORDER — PANTOPRAZOLE SODIUM 40 MG PO TBEC: 40.0000 mg | DELAYED_RELEASE_TABLET | Freq: Every day | ORAL | 0 refills | Status: AC

## 2019-01-18 MED ORDER — MIRTAZAPINE 7.5 MG PO TABS: 7.5000 mg | ORAL_TABLET | Freq: Every day | ORAL | 1 refills | Status: AC

## 2019-01-18 MED ORDER — "INSULIN SYRINGE 31G X 5/16"" 0.5 ML MISC": 0 refills | Status: AC

## 2019-01-18 MED ORDER — INSULIN DETEMIR 100 UNIT/ML ~~LOC~~ SOLN: 10.0000 [IU] | Freq: Every day | SUBCUTANEOUS | 11 refills | Status: AC

## 2019-01-18 MED ORDER — INSULIN DETEMIR 100 UNIT/ML ~~LOC~~ SOLN: 10.0000 [IU] | SUBCUTANEOUS | 11 refills | Status: DC

## 2019-01-18 MED ORDER — NITROGLYCERIN 0.4 MG SL SUBL: 0.4000 mg | SUBLINGUAL_TABLET | SUBLINGUAL | 1 refills | Status: AC | PRN

## 2019-01-18 MED ORDER — INSULIN ASPART 100 UNIT/ML ~~LOC~~ SOLN: 12.0000 [IU] | Freq: Three times a day (TID) | SUBCUTANEOUS | 1 refills | Status: DC

## 2019-01-18 MED ORDER — INSULIN ASPART 100 UNIT/ML ~~LOC~~ SOLN
8.0000 [IU] | Freq: Three times a day (TID) | SUBCUTANEOUS | Status: DC
  Administered 2019-01-19: 09:00:00 8 [IU] via SUBCUTANEOUS

## 2019-01-18 MED ORDER — INSULIN ASPART 100 UNIT/ML ~~LOC~~ SOLN: 12.0000 [IU] | Freq: Three times a day (TID) | SUBCUTANEOUS | Status: DC

## 2019-01-18 MED ORDER — INSULIN ASPART 100 UNIT/ML ~~LOC~~ SOLN: 8.0000 [IU] | Freq: Three times a day (TID) | SUBCUTANEOUS | 1 refills | Status: AC

## 2019-01-18 MED ORDER — SODIUM CHLORIDE 0.9 % IV SOLN: Freq: Once | INTRAVENOUS | Status: AC

## 2019-01-18 MED ORDER — APIXABAN 5 MG PO TABS: 5.0000 mg | ORAL_TABLET | Freq: Two times a day (BID) | ORAL | 1 refills | Status: AC

## 2019-01-18 MED FILL — ELIQUIS 5 MG TABLET: 5 | 30 days supply | Qty: 60 | Fill #0

## 2019-01-18 MED FILL — NITROGLYCERIN 0.4 MG TAB SL: 0.4 | 8 days supply | Qty: 25 | Fill #0

## 2019-01-18 MED FILL — NovoLOG 100 UNIT/ML SOLN: 100 | 27 days supply | Qty: 10 | Fill #0

## 2019-01-18 MED FILL — PANTOPRAZOLE SOD DR 40 MG T: 40 | 30 days supply | Qty: 30 | Fill #0

## 2019-01-18 MED FILL — ULTICARE INS 0.3 ML 30GX1/2: 30G X 1/2" | 30 days supply | Qty: 120 | Fill #0

## 2019-01-18 MED FILL — MIRTAZAPINE 7.5 MG TABLET: 7.5 | 30 days supply | Qty: 30 | Fill #0

## 2019-01-18 MED FILL — LEVEMIR 100 UNITS/ML VIAL: 100 | 30 days supply | Qty: 10 | Fill #0

## 2019-01-18 NOTE — Discharge Summary (Addendum)
. Physician Discharge Summary  Timothy Madden:[id number] DOB: May 03, 1960 DOA: 12/08/2018  PCP: Marliss Coots, NP  Admit date: 12/08/2018 Discharge date: 01/19/2019  Admitted From: Home Disposition:  Discharged to home.  Recommendations for Outpatient Follow-up:  1. Follow up with PCP in 1 week 2. Please obtain BMP/CBC in 1 week 3. Follow up with Urology as scheduled 4. Follow up with Nephrology as scheduled  Discharge Condition: Stable  CODE STATUS: FULL   Brief/Interim Summary: CSW spoke with pt brother [name] via telephone [phone]. CSW reintroduced self, role, reason for call. We discussed pt progress and barriers to placement at this time. Pt is ambulating with min assist-supervision level. He does continue to need support with medications/foley etc. We discussed that currently there are no placement options due to pending Medicaid and disability applications as well as COVID closing down many SNFs to admissions. CSW asked if [name] would be willing to do education with [name] and our nursing staff here around what medications Timothy Madden takes and assist as needed. [name] states he is helping his mother with these things and Herson can go home to his mothers home. CSW will need to discuss with RN staff if we can provide education to [place] if he comes to hospital. CSW also spoke with Nat Christen, LCSW with [facility] to assess what was occurring at pt home at this time.  Discharge Diagnoses:  Active Problems:   Type 2 diabetes mellitus with stage 3 chronic kidney disease (HCC)   Sepsis ([place])   Essential hypertension   Acute renal failure superimposed on chronic kidney disease (HCC)   DKA, type 1, not at goal [facility])   Protein-calorie malnutrition, severe   Acute lower UTI   Bacteremia due to Gram-negative bacteria   Bilateral hydronephrosis   DVT (deep venous thrombosis) (HCC)   Metabolic acidosis   Anemia due to chronic kidney disease   Pressure injury of skin   Hyperkalemia    Hyperglycemia   Pyelonephritis  Diabetes mellitus type 2, DKA resolved: - DKA resolved, monitoring - on levemir (10 qHS) and novolog (8 WM)  AKI on CKD stage 4, resolved AKI: Bilateral Hydronephrosis 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), watch for uremia symptoms - catheter in place for obstructive uropathy - no current antibiotics - watch nephrotoxins     - follow up with nephrology and urology  Severe protein calorie malnutrition: - per dietician  Anemia associated with CKD: - aranesp weekly - stable Hgb, monitor  Hx of RLE DVT: - as seen on doppler from 10/31  - continue apixaban BID  Serratia UTI Serratia bacteremia     - finished abx 12/4 (rocephin)  B/l lower lobe PNA     - finished abx 12/10 (zosyn, augmentin)  Protein-calorie malnutrition     - diet supplementation; encourage diet  Discharge Instructions   Allergies as of 01/18/2019   No Known Allergies     Medication List    STOP taking these medications   Rivaroxaban 15 & 20 MG Tbpk   tamsulosin 0.4 MG Caps capsule Commonly known as: FLOMAX     TAKE these medications   apixaban 5 MG Tabs tablet Commonly known as: ELIQUIS Take 1 tablet (5 mg total) by mouth 2 (two) times daily.   blood glucose meter kit and supplies Kit Dispense based on patient and insurance preference. Use up to four times daily as directed. (FOR ICD-9 250.00, 250.01).   insulin aspart 100 UNIT/ML injection Commonly known  as: novoLOG Inject 12 Units into the skin 3 (three) times daily with meals. What changed: how much to take   insulin aspart 100 UNIT/ML injection Commonly known as: novoLOG Inject 8 Units into the skin 3 (three) times daily with meals. What changed: You were already taking a medication with the same name, and this prescription was added. Make sure you understand how and when to take each.   insulin detemir 100 UNIT/ML  injection Commonly known as: LEVEMIR Inject 0.1 mLs (10 Units total) into the skin at bedtime. What changed:   how much to take  when to take this   INSULIN SYRINGE .5CC/31GX5/16" 31G X 5/16" 0.5 ML Misc Levemir 10 units daily and NovoLog 12 units 3 times a day with meals What changed: additional instructions   mirtazapine 7.5 MG tablet Commonly known as: REMERON Take 1 tablet (7.5 mg total) by mouth at bedtime.   nitroGLYCERIN 0.4 MG SL tablet Commonly known as: NITROSTAT Place 1 tablet (0.4 mg total) under the tongue every 5 (five) minutes as needed for up to 40 doses for chest pain.   pantoprazole 40 MG tablet Commonly known as: PROTONIX Take 1 tablet (40 mg total) by mouth daily.            Durable Medical Equipment  (From admission, onward)         Start     Ordered   01/17/19 1058  For home use only DME Walker rolling  Once    Question Answer Comment  Walker: With 5 Inch Wheels   Patient needs a walker to treat with the following condition Weakness      01/17/19 1057         Follow-up Information    Elmarie Shiley, MD Follow up in 4 week(s).   Specialty: Nephrology Why: You will be called with appointment date and time to see the PA/NP for Dr.Patel.   Contact information: [place] [address] [phone]        Ardis Hughs, MD Follow up in 1 week(s).   Specialty: Urology Why: Call for appt.  Contact information: [place] [address] [phone]        Marliss Coots, NP Follow up in 1 week(s).   Contact information: [place] [address] [phone]          No Known Allergies  Consultations:  Urology  Nephrology  PCCM   Procedures/Studies: US Renal  Result Date: 01/04/2019 CLINICAL DATA:  Follow-up chronic BILATERAL hydronephrosis. EXAM: RENAL / URINARY TRACT ULTRASOUND COMPLETE COMPARISON:  Urinary tract ultrasound 12/15/2018 and earlier. CT abdomen and pelvis  11/10/2018. FINDINGS: Right Kidney: Renal measurements: Approximately 11.6 x 5.7 x 5.8 cm = volume: 201 mL. Echogenic parenchyma. Lobular contour as noted previously. Mild to moderate hydronephrosis, unchanged since the ultrasound 3 weeks ago and unchanged since the 11/10/2018 CT. Left Kidney: Renal measurements: Approximately 10.8 x 5.2 x 4.1 cm = volume: 122 mL. Borderline echogenic parenchyma. Lobular contour as noted previously. Mild to moderate hydronephrosis, also unchanged since the ultrasound 3 weeks ago and the 11/10/2018 CT. Approximate 6 mm calculus in a mid calyx. Bladder: Decompressed by Foley catheter. Marked diffuse bladder wall thickening as noted on the prior examinations. Other: None. IMPRESSION: 1. Mild-to-moderate BILATERAL hydronephrosis, unchanged since the most recent prior ultrasound 12/15/2018 and unchanged since the CT 11/10/2018. 2. Bladder decompressed by Foley catheter. Diffuse bladder wall thickening is unchanged from prior examinations and may reflect  chronic cystitis. Electronically Signed   By: Evangeline Dakin M.D.   On: 01/04/2019 12:44      Subjective: Denies complaints. Says he saw the snow.  Discharge Exam: Vitals:   01/17/19 2039 01/18/19 0446  BP: 108/76 104/75  Pulse: 98 91  Resp: 17 17  Temp: 98.1 F (36.7 C) 98.4 F (36.9 C)  SpO2: 100% 100%   Vitals:   01/17/19 0621 01/17/19 1703 01/17/19 2039 01/18/19 0446  BP: 101/77 110/70 108/76 104/75  Pulse: 99 96 98 91  Resp: '18 18 17 17  ' Temp: 98.6 F (37 C) 97.9 F (36.6 C) 98.1 F (36.7 C) 98.4 F (36.9 C)  TempSrc: Oral Oral Oral Oral  SpO2: 100% 100% 100% 100%  Weight:      Height:        General: 59 y.o. male resting in bed in NAD Cardiovascular: RRR, +S1, S2, no m/g/r, equal pulses throughout Respiratory: CTABL, no w/r/r, normal WOB GI: BS+, NDNT, soft MSK: No e/c/c Neuro: A&O x 3, no focal deficits Psyc: Appropriate interaction and affect, calm/cooperative     The results of  significant diagnostics from this hospitalization (including imaging, microbiology, ancillary and laboratory) are listed below for reference.     Microbiology: Recent Results (from the past 240 hour(s))  Culture, Urine     Status: Abnormal   Collection Time: 01/09/19  1:10 PM   Specimen: Urine, Catheterized  Result Value Ref Range Status   Specimen Description URINE, CATHETERIZED  Final   Special Requests   Final    NONE Performed at [facility], [address], [place], [place] [address]    Culture >=100,000 COLONIES/mL SERRATIA MARCESCENS (A)  Final   Report Status 01/12/2019 FINAL  Final   Organism ID, Bacteria SERRATIA MARCESCENS (A)  Final      Susceptibility   Serratia marcescens - MIC*    CEFAZOLIN >=64 RESISTANT Resistant     CEFTRIAXONE <=0.25 SENSITIVE Sensitive     CIPROFLOXACIN <=0.25 SENSITIVE Sensitive     GENTAMICIN <=1 SENSITIVE Sensitive     NITROFURANTOIN 256 RESISTANT Resistant     TRIMETH/SULFA <=20 SENSITIVE Sensitive     * >=100,000 COLONIES/mL SERRATIA MARCESCENS     Labs: BNP (last 3 results) No results for input(s): BNP in the last 8760 hours. Basic Metabolic Panel: Recent Labs  Lab 01/14/19 1028 01/16/19 0221 01/18/19 0343  NA 139 138 138  K 4.3 4.6 4.7  CL 110 109 108  CO2 17* 18* 18*  GLUCOSE 222* 169* 159*  BUN 98* 107* 115*  CREATININE 4.45* 4.81* 4.54*  CALCIUM 9.7 9.2 9.2  MG  --   --  2.3  PHOS  --   --  6.2*   Liver Function Tests: Recent Labs  Lab 01/16/19 0221 01/18/19 0343  AST 20  --   ALT 28  --   ALKPHOS 99  --   BILITOT 0.5  --   PROT 8.7*  --   ALBUMIN 3.0* 3.2*   No results for input(s): LIPASE, AMYLASE in the last 168 hours. No results for input(s): AMMONIA in the last 168 hours. CBC: Recent Labs  Lab 01/14/19 0547 01/16/19 0221 01/18/19 0343  WBC 14.2* 12.4* 11.7*  NEUTROABS 9.3*  --  8.6*  HGB 11.7* 11.9* 11.5*  HCT 38.3* 38.5* 37.6*  MCV 89.9 87.9 89.1  PLT 424* 400 395   Cardiac  Enzymes: No results for input(s): CKTOTAL, CKMB, CKMBINDEX, TROPONINI in the last 168 hours. BNP:  Invalid input(s): POCBNP CBG: Recent Labs  Lab 01/17/19 1623 01/17/19 1652 01/17/19 2041 01/17/19 2121 01/18/19 0735  GLUCAP 204* 218* 69* 105* 175*   D-Dimer No results for input(s): DDIMER in the last 72 hours. Hgb A1c No results for input(s): HGBA1C in the last 72 hours. Lipid Profile No results for input(s): CHOL, HDL, LDLCALC, TRIG, CHOLHDL, LDLDIRECT in the last 72 hours. Thyroid function studies No results for input(s): TSH, T4TOTAL, T3FREE, THYROIDAB in the last 72 hours.  Invalid input(s): FREET3 Anemia work up No results for input(s): VITAMINB12, FOLATE, FERRITIN, TIBC, IRON, RETICCTPCT in the last 72 hours. Urinalysis    Component Value Date/Time   COLORURINE YELLOW 01/09/2019 1345   APPEARANCEUR TURBID (A) 01/09/2019 1345   LABSPEC 1.011 01/09/2019 1345   PHURINE 6.0 01/09/2019 1345   GLUCOSEU NEGATIVE 01/09/2019 1345   HGBUR SMALL (A) 01/09/2019 1345   BILIRUBINUR NEGATIVE 01/09/2019 1345   KETONESUR NEGATIVE 01/09/2019 1345   PROTEINUR 30 (A) 01/09/2019 1345   UROBILINOGEN 0.2 10/26/2013 0034   NITRITE NEGATIVE 01/09/2019 1345   LEUKOCYTESUR LARGE (A) 01/09/2019 1345   Sepsis Labs Invalid input(s): PROCALCITONIN,  WBC,  LACTICIDVEN Microbiology Recent Results (from the past 240 hour(s))  Culture, Urine     Status: Abnormal   Collection Time: 01/09/19  1:10 PM   Specimen: Urine, Catheterized  Result Value Ref Range Status   Specimen Description URINE, CATHETERIZED  Final   Special Requests   Final    NONE Performed at [facility], [address] [place], [place] [address]    Culture >=100,000 COLONIES/mL SERRATIA MARCESCENS (A)  Final   Report Status 01/12/2019 FINAL  Final   Organism ID, Bacteria SERRATIA MARCESCENS (A)  Final      Susceptibility   Serratia marcescens - MIC*    CEFAZOLIN >=64 RESISTANT Resistant     CEFTRIAXONE <=0.25  SENSITIVE Sensitive     CIPROFLOXACIN <=0.25 SENSITIVE Sensitive     GENTAMICIN <=1 SENSITIVE Sensitive     NITROFURANTOIN 256 RESISTANT Resistant     TRIMETH/SULFA <=20 SENSITIVE Sensitive     * >=100,000 COLONIES/mL SERRATIA MARCESCENS     Time coordinating discharge: 35 minutes  SIGNED:   Jonnie Finner, DO  [facility] 01/19/2019, 12:47 PM   If 7PM-7AM, please contact night-coverage [email or web address]

## 2019-01-18 NOTE — Progress Notes (Signed)
Physical Therapy Treatment Patient Details Name: Timothy Madden MRN: [id number] DOB: 1960-05-16 Today's Date: 01/18/2019    History of Present Illness This is a 59 year old man with a history of numerous psychiatric issues, diabetes poorly controlled supposed to be on home insulin presenting with obtundation from home found to be in severe DKA/HOCM.  PMH DKA, DM, L second finger amputation.    PT Comments    Pt in bed upon arrival of PT, was agreeable to participate in PT session focused on mobility and higher-level balance due to anticipation of d/c home with family. The pt continues to demo some deficits in endurance, ambulation, and stability, but was able to demonstrate good stability through ambulation and dynamic balance challenges through session. The patient also completed a 5xsit-to-stand (5XSTS) in 33s with use of RW. This is above the age-based cut-off of 11.4s and therefore indicates they are at increased risk for falls. The patient will therefore continue to benefit from skilled PT to address limitations in functional strength, mobility, and balance both acutely and with [facility] services following d/c home. The pt verbally discussed transition to living with brother, all questions answered at this time.     Follow Up Recommendations  Home health PT     Equipment Recommendations  Rolling walker with 5" wheels    Recommendations for Other Services       Precautions / Restrictions Precautions Precautions: Fall Precaution Comments: fall risk greatly decr with use of RW Restrictions Weight Bearing Restrictions: No    Mobility  Bed Mobility Overal bed mobility: Modified Independent Bed Mobility: Supine to Sit     Supine to sit: Modified independent (Device/Increase time)        Transfers Overall transfer level: Needs assistance Equipment used: Rolling walker (2 wheeled) Transfers: Sit to/from Stand Sit to Stand: Supervision         General transfer comment:  Supervision for safety  Ambulation/Gait Ambulation/Gait assistance: Supervision Gait Distance (Feet): 200 Feet Assistive device: Rolling walker (2 wheeled) Gait Pattern/deviations: Decreased step length - right;Decreased step length - left;Decreased stride length Gait velocity: 0.27 m/s with RW, 0.36 m/s when asked to "walk fast" Gait velocity interpretation: <1.8 ft/sec, indicate of risk for recurrent falls General Gait Details: supervision with no LOB   Stairs             Wheelchair Mobility    Modified Rankin (Stroke Patients Only)       Balance Overall balance assessment: Needs assistance Sitting-balance support: No upper extremity supported;Feet unsupported Sitting balance-Leahy Scale: Good Sitting balance - Comments: steady sitting EOB and during LE therex   Standing balance support: Bilateral upper extremity supported;Single extremity supported Standing balance-Leahy Scale: Fair Standing balance comment: reliant on BUE from Rw for ambulation, but stood with one hand on RW for static balance                            Cognition Arousal/Alertness: Awake/alert Behavior During Therapy: Flat affect Overall Cognitive Status: No family/caregiver present to determine baseline cognitive functioning Area of Impairment: Safety/judgement                   Current Attention Level: Focused Memory: Decreased short-term memory Following Commands: Follows one step commands consistently;Follows one step commands with increased time Safety/Judgement: Decreased awareness of safety;Decreased awareness of deficits   Problem Solving: Slow processing;Decreased initiation;Difficulty sequencing;Requires verbal cues;Requires tactile cues General Comments: Answers "yes" to almost everything. Did answer a  few questions with multi- word responses.      Exercises      General Comments        Pertinent Vitals/Pain Pain Assessment: No/denies pain Pain Location:  after sesion, pt reports discomfort due to hemorrhoids Pain Descriptors / Indicators: Grimacing Pain Intervention(s): Limited activity within patient's tolerance;Monitored during session    Home Living                      Prior Function            PT Goals (current goals can now be found in the care plan section) Acute Rehab PT Goals Patient Stated Goal: did not state, but agreeable to walk PT Goal Formulation: With patient Time For Goal Achievement: 01/30/19 Potential to Achieve Goals: Fair Additional Goals Additional Goal #1: Pt will score greater than or equal to 49/56 on Ber Balance Assessment to demonstrate decr fall risk. Progress towards PT goals: Progressing toward goals    Frequency    Min 2X/week      PT Plan Discharge plan needs to be updated;Frequency needs to be updated    Co-evaluation              AM-PAC PT "6 Clicks" Mobility   Outcome Measure  Help needed turning from your back to your side while in a flat bed without using bedrails?: None Help needed moving from lying on your back to sitting on the side of a flat bed without using bedrails?: None Help needed moving to and from a bed to a chair (including a wheelchair)?: None Help needed standing up from a chair using your arms (e.g., wheelchair or bedside chair)?: A Little Help needed to walk in hospital room?: A Little Help needed climbing 3-5 steps with a railing? : A Little 6 Click Score: 21    End of Session Equipment Utilized During Treatment: Gait belt Activity Tolerance: Patient tolerated treatment well Patient left: in chair;with call bell/phone within reach;with chair alarm set Nurse Communication: Mobility status PT Visit Diagnosis: Other abnormalities of gait and mobility (R26.89);Muscle weakness (generalized) (M62.81);Other symptoms and signs involving the nervous system (R29.898)     Time: 1450-1510 PT Time Calculation (min) (ACUTE ONLY): 20 min  Charges:  $Gait  Training: 8-22 mins                     Karma Ganja, PT, DPT   Acute Rehabilitation Department [phone]   Otho Bellows 01/18/2019, 4:16 PM

## 2019-01-18 NOTE — TOC Progression Note (Signed)
Transition of Care [facility]) - Progression Note    Patient Details  Name: Timothy Madden MRN: [id number] Date of Birth: 12/14/1960  Transition of Care [facility] CM/SW Contact  Jacalyn Lefevre Edson Snowball, RN Phone Number: 01/18/2019, 11:29 AM  Clinical Narrative:     Patient entered in [facility] again, with zero co pay.   Called Zack with [facility] regarding walker. Patient received a rollator on 08/22/2018 and has not paid for it so his account is on hold.   Expected Discharge Plan: [place] Barriers to Discharge: Continued Medical Work up, Barriers Unresolved (comment)(still working on supports at home/in community.)  Expected Discharge Plan and Services Expected Discharge Plan: [place] In-house Referral: Clinical Social Work Discharge Planning Services: [facility], [facility], Medication Assistance Post Acute Care Choice: Home Health, Durable Medical Equipment Living arrangements for the past 2 months: Apartment                 DME Arranged: Walker rolling DME Agency: [employer] Date DME Agency Contacted: 01/17/19 Time DME Agency Contacted: 13 Representative spoke with at DME Agency: [place]: PT, RN, Social Work [employer] Agency: [place] ([employer]) Date [place]: 01/17/19 Time [place]: 1140 Representative spoke with at [place]: [place] ([place]) Interventions    Readmission Risk Interventions Readmission Risk Prevention Plan 11/14/2018  Transportation Screening Complete  PCP or Specialist Appt within 3-5 Days Not Complete  Not Complete comments Clinics to which patient goes do not make appointments-it is walk in.  [place] or Home Care Consult Complete  Social Work Consult for [place] Planning/Counseling Complete  Palliative Care Screening Not Applicable  Medication Review (RN Care Manager) Referral to Pharmacy  Some recent data might be hidden

## 2019-01-18 NOTE — Progress Notes (Signed)
Marland Kitchen  PROGRESS NOTE    Timothy Madden  POE:[id number] DOB: [birth year] DOA: 12/08/2018 PCP: Timothy Coots, NP   Brief Narrative:   CSW spoke with pt brother Timothy Madden via telephone [phone]. CSW reintroduced self, role, reason for call. We discussed pt progress and barriers to placement at this time. Pt is ambulating with min assist-supervision level. He does continue to need support with medications/foley etc. We discussed that currently there are no placement options due to pending Medicaid and disability applications as well as COVID closing down many SNFs to admissions. CSW asked if Timothy Madden would be willing to do education with Timothy Madden and our nursing staff here around what medications Timothy Madden takes and assist as needed. Timothy Madden states he is helping his mother with these things and Timothy Madden can go home to his mothers home. CSW will need to discuss with RN staff if we can provide education to [place] if he comes to hospital. CSW also spoke with Timothy Christen, LCSW with [facility] to assess what was occurring at pt home at this time.  01/18/19: Glucoses still all over the place. Hold discharge.    Assessment & Plan:   Active Problems:   Type 2 diabetes mellitus with stage 3 chronic kidney disease (HCC)   Sepsis ([place])   Essential hypertension   Acute renal failure superimposed on chronic kidney disease (HCC)   Protein-calorie malnutrition, severe   Acute lower UTI   Bacteremia due to Gram-negative bacteria   Bilateral hydronephrosis   DVT (deep venous thrombosis) (HCC)   Metabolic acidosis   Anemia due to chronic kidney disease   Pressure injury of skin   Hyperkalemia   Hyperglycemia   Pyelonephritis  Diabetes mellitus type 2, DKA resolved: - DKA resolved, monitoring - on levemir (10 qHS) and novolog (12 WM)     - his glucoses are still not stable, hold discharge will decrease novolog to 8 WM, follow.  AKI on CKD stage 4, resolved AKI: Bilateral Hydronephrosis Ectopic ureters - BUN is  still high (chronic at this point), watch for uremia symptoms - catheter in place for obstructive uropathy - no current antibiotics - watch nephrotoxins     - follow up with nephrology and urology  Severe protein calorie malnutrition: - per dietician  Anemia associated with CKD: - aranesp weekly - stable Hgb, monitor  Hx of RLE DVT: - as seen on doppler from 10/31  - continue apixaban BID  Serratia UTI Serratia bacteremia     - finished abx 12/4 (rocephin)  B/l lower lobe PNA     - finished abx 12/10 (zosyn, augmentin)  Protein-calorie malnutrition     - diet supplementation; encourage diet  DVT prophylaxis: eliquis Code Status: FULL Family Communication: None at bedside.   Disposition Plan: To home in AM after sugars stabilize.  Consultants:   Nephrology  Urology  Oncology  ROS:  Denies dyspnea, CP, N, V, ab pain . Remainder 10-pt ROS is negative for all not previously mentioned.  Subjective: "Oh, it's today?"  Objective: Vitals:   01/17/19 1703 01/17/19 2039 01/18/19 0446 01/18/19 1321  BP: 110/70 108/76 104/75 (!) 131/91  Pulse: 96 98 91 (!) 109  Resp: 18 17 17 14   Temp: 97.9 F (36.6 C) 98.1 F (36.7 C) 98.4 F (36.9 C) 98.5 F (36.9 C)  TempSrc: Oral Oral Oral Oral  SpO2: 100% 100% 100% 99%  Weight:      Height:        Intake/Output Summary (Last 24 hours) at 01/18/2019  [place] filed at 01/18/2019 0900 Gross per 24 hour  Intake 720 ml  Output 1500 ml  Net -780 ml   Filed Weights   12/08/18 1200 12/08/18 2050 12/09/18 0300  Weight: 86.9 kg 60.8 kg 50 kg    Examination:  General: 59 y.o. male resting in bed in NAD Cardiovascular: RRR, +S1, S2, no m/g/r, equal pulses throughout Respiratory: CTABL, no w/r/r, normal WOB GI: BS+, NDNT, soft MSK: No e/c/c Neuro: A&O x 3, no focal deficits Psyc: Appropriate interaction and affect, calm/cooperative   Data Reviewed: I have personally reviewed  following labs and imaging studies.  CBC: Recent Labs  Lab 01/14/19 0547 01/16/19 0221 01/18/19 0343  WBC 14.2* 12.4* 11.7*  NEUTROABS 9.3*  --  8.6*  HGB 11.7* 11.9* 11.5*  HCT 38.3* 38.5* 37.6*  MCV 89.9 87.9 89.1  PLT 424* 400 967   Basic Metabolic Panel: Recent Labs  Lab 01/14/19 1028 01/16/19 0221 01/18/19 0343  NA 139 138 138  K 4.3 4.6 4.7  CL 110 109 108  CO2 17* 18* 18*  GLUCOSE 222* 169* 159*  BUN 98* 107* 115*  CREATININE 4.45* 4.81* 4.54*  CALCIUM 9.7 9.2 9.2  MG  --   --  2.3  PHOS  --   --  6.2*   GFR: Estimated Creatinine Clearance: 12.7 mL/min (A) (by C-G formula based on SCr of 4.54 mg/dL (H)). Liver Function Tests: Recent Labs  Lab 01/16/19 0221 01/18/19 0343  AST 20  --   ALT 28  --   ALKPHOS 99  --   BILITOT 0.5  --   PROT 8.7*  --   ALBUMIN 3.0* 3.2*   No results for input(s): LIPASE, AMYLASE in the last 168 hours. No results for input(s): AMMONIA in the last 168 hours. Coagulation Profile: No results for input(s): INR, PROTIME in the last 168 hours. Cardiac Enzymes: No results for input(s): CKTOTAL, CKMB, CKMBINDEX, TROPONINI in the last 168 hours. BNP (last 3 results) No results for input(s): PROBNP in the last 8760 hours. HbA1C: No results for input(s): HGBA1C in the last 72 hours. CBG: Recent Labs  Lab 01/17/19 2041 01/17/19 2121 01/18/19 0735 01/18/19 1202 01/18/19 1300  GLUCAP 69* 105* 175* 65* 148*   Lipid Profile: No results for input(s): CHOL, HDL, LDLCALC, TRIG, CHOLHDL, LDLDIRECT in the last 72 hours. Thyroid Function Tests: No results for input(s): TSH, T4TOTAL, FREET4, T3FREE, THYROIDAB in the last 72 hours. Anemia Panel: No results for input(s): VITAMINB12, FOLATE, FERRITIN, TIBC, IRON, RETICCTPCT in the last 72 hours. Sepsis Labs: No results for input(s): PROCALCITON, LATICACIDVEN in the last 168 hours.  Recent Results (from the past 240 hour(s))  Culture, Urine     Status: Abnormal   Collection Time:  01/09/19  1:10 PM   Specimen: Urine, Catheterized  Result Value Ref Range Status   Specimen Description URINE, CATHETERIZED  Final   Special Requests   Final    NONE Performed at [facility], [address], [place], [place] [address]    Culture >=100,000 COLONIES/mL SERRATIA MARCESCENS (A)  Final   Report Status 01/12/2019 FINAL  Final   Organism ID, Bacteria SERRATIA MARCESCENS (A)  Final      Susceptibility   Serratia marcescens - MIC*    CEFAZOLIN >=64 RESISTANT Resistant     CEFTRIAXONE <=0.25 SENSITIVE Sensitive     CIPROFLOXACIN <=0.25 SENSITIVE Sensitive     GENTAMICIN <=1 SENSITIVE Sensitive     NITROFURANTOIN 256 RESISTANT Resistant  TRIMETH/SULFA <=20 SENSITIVE Sensitive     * >=100,000 COLONIES/mL SERRATIA MARCESCENS      Radiology Studies: No results found.   Scheduled Meds: . apixaban  5 mg Oral BID  . Chlorhexidine Gluconate Cloth  6 each Topical Q0600  . darbepoetin (ARANESP) injection - NON-DIALYSIS  60 mcg Subcutaneous Q Mon-1800  . feeding supplement (PRO-STAT SUGAR FREE 64)  30 mL Oral BID  . insulin aspart  0-15 Units Subcutaneous TID WC  . insulin aspart  0-5 Units Subcutaneous QHS  . insulin aspart  12 Units Subcutaneous TID WC  . insulin detemir  10 Units Subcutaneous Q24H  . mirtazapine  7.5 mg Oral QHS  . pantoprazole  40 mg Oral QHS   Continuous Infusions: . sodium chloride 10 mL/hr at 12/15/18 1400     LOS: 41 days    Time spent: 35 minutes spent in the coordination of care today.    Jonnie Finner, DO [facility]  If 7PM-7AM, please contact night-coverage [email or web address] 01/18/2019, 2:25 PM

## 2019-01-18 NOTE — TOC Transition Note (Addendum)
Transition of Care [facility]) - CM/SW Discharge Note   Patient Details  Name: INDY KUCK MRN: [id number] Date of Birth: 1960-02-23  [facility]) CM/SW Contact:  Alexander [place], [place] Phone Number: 01/18/2019, 12:38 PM   Clinical Narrative:  1:18pm- CSW placed report through on call APS report line [place], [place].     12:38pm- CSW called and spoke with pt brother [name], due to transportation issues he is unable to come up to the hospital but states he is available for pt teaching through phone with RN. Pt medications have been delivered to the room, HH has been arranged, pt states his sister will be home. Await confirmation of teaching to arrange discharge transportation.    Final next level of care: [place] Barriers to Discharge: Barriers Resolved   Patient Goals and CMS Choice Patient states their goals for this hospitalization and ongoing recovery are:: to go home CMS Medicare.gov Compare Post Acute Care list provided to:: Patient Choice offered to / list presented to : Patient  Discharge Placement Patient to be transferred to facility by: [facility] Transport Name of family member notified: pt brother [name] Patient and family notified of of transfer: 01/18/19  Discharge Plan and Services In-house Referral: Clinical Social Work Discharge Planning Services: [facility], [place] [facility], Medication Assistance Post Acute Care Choice: Home Health, Durable Medical Equipment          DME Arranged: [place] DME Agency: [employer] Date DME Agency Contacted: 01/17/19 Time DME Agency Contacted: 41 Representative spoke with at DME Agency: Zack HH Arranged: PT, RN, Social Work [employer] Agency: [place] ([place]) Date [place]: 01/17/19 Time [place]: 1140 Representative spoke with at [place]: Mateo Flow   Readmission Risk Interventions Readmission Risk Prevention Plan  11/14/2018  Transportation Screening Complete  PCP or Specialist Appt within 3-5 Days Not Complete  Not Complete comments Clinics to which patient goes do not make appointments-it is walk in.  [place] or Home Care Consult Complete  Social Work Consult for [place] Planning/Counseling Complete  Palliative Care Screening Not Applicable  Medication Review (RN Care Manager) Referral to Pharmacy  Some recent data might be hidden

## 2019-01-19 LAB — RENAL FUNCTION PANEL
Albumin: 3.1 g/dL — ABNORMAL LOW (ref 3.5–5.0)
Anion gap: 7 (ref 5–15)
BUN: 97 mg/dL — ABNORMAL HIGH (ref 6–20)
CO2: 20 mmol/L — ABNORMAL LOW (ref 22–32)
Calcium: 9.1 mg/dL (ref 8.9–10.3)
Chloride: 109 mmol/L (ref 98–111)
Creatinine, Ser: 4.32 mg/dL — ABNORMAL HIGH (ref 0.61–1.24)
GFR calc Af Amer: 16 mL/min — ABNORMAL LOW (ref >60)
GFR calc non Af Amer: 14 mL/min — ABNORMAL LOW (ref >60)
Glucose, Bld: 236 mg/dL — ABNORMAL HIGH (ref 70–99)
Phosphorus: 4.9 mg/dL — ABNORMAL HIGH (ref 2.5–4.6)
Potassium: 4.4 mmol/L (ref 3.5–5.1)
Sodium: 136 mmol/L (ref 135–145)

## 2019-01-19 LAB — GLUCOSE, CAPILLARY
Glucose-Capillary: 360 mg/dL — ABNORMAL HIGH (ref 70–99)
Glucose-Capillary: 70 mg/dL (ref 70–99)

## 2019-01-19 MED ORDER — BLOOD GLUCOSE MONITORING SUPPL DEVI: 0 refills | Status: AC

## 2019-01-19 NOTE — Progress Notes (Signed)
Patient's sister,Angela at bedside. Discharged instructions, personal belongings,medications from pharmacy given to same. Education about patient's medication given to patient and sister. Verbalized understanding of instructions

## 2019-01-24 MED FILL — !TRUE METRIX BLOOD GLUCOSE: 1 days supply | Qty: 1 | Fill #0

## 2019-01-24 MED FILL — TRUE METRIX TEST STRIP: 50 days supply | Qty: 100 | Fill #0

## 2019-01-24 MED FILL — TRUEplus LANCETS 28G MISC: 50 days supply | Qty: 100 | Fill #0

## 2019-01-31 ENCOUNTER — Encounter (HOSPITAL_COMMUNITY): Payer: Self-pay | Admitting: Emergency Medicine

## 2019-01-31 ENCOUNTER — Other Ambulatory Visit: Payer: Self-pay

## 2019-01-31 ENCOUNTER — Emergency Department (HOSPITAL_COMMUNITY)
Admission: EM | Admit: 2019-01-31 | Discharge: 2019-02-01 | Disposition: A | Discharge status: Home / Self Care | Payer: Medicaid Other | Attending: Emergency Medicine | Admitting: Emergency Medicine

## 2019-01-31 DIAGNOSIS — N183 Chronic kidney disease, stage 3 unspecified: Secondary | ICD-10-CM | POA: Diagnosis not present

## 2019-01-31 DIAGNOSIS — Z7901 Long term (current) use of anticoagulants: Secondary | ICD-10-CM | POA: Insufficient documentation

## 2019-01-31 DIAGNOSIS — R319 Hematuria, unspecified: Secondary | ICD-10-CM

## 2019-01-31 DIAGNOSIS — I129 Hypertensive chronic kidney disease with stage 1 through stage 4 chronic kidney disease, or unspecified chronic kidney disease: Secondary | ICD-10-CM | POA: Insufficient documentation

## 2019-01-31 DIAGNOSIS — Z79899 Other long term (current) drug therapy: Secondary | ICD-10-CM | POA: Diagnosis not present

## 2019-01-31 DIAGNOSIS — E1022 Type 1 diabetes mellitus with diabetic chronic kidney disease: Secondary | ICD-10-CM | POA: Insufficient documentation

## 2019-01-31 DIAGNOSIS — F17210 Nicotine dependence, cigarettes, uncomplicated: Secondary | ICD-10-CM | POA: Insufficient documentation

## 2019-01-31 DIAGNOSIS — R58 Hemorrhage, not elsewhere classified: Secondary | ICD-10-CM | POA: Diagnosis not present

## 2019-01-31 DIAGNOSIS — Z794 Long term (current) use of insulin: Secondary | ICD-10-CM | POA: Insufficient documentation

## 2019-01-31 DIAGNOSIS — E1165 Type 2 diabetes mellitus with hyperglycemia: Secondary | ICD-10-CM | POA: Diagnosis not present

## 2019-01-31 LAB — BASIC METABOLIC PANEL
Anion gap: 8 (ref 5–15)
BUN: 45 mg/dL — ABNORMAL HIGH (ref 6–20)
CO2: 20 mmol/L — ABNORMAL LOW (ref 22–32)
Calcium: 8.8 mg/dL — ABNORMAL LOW (ref 8.9–10.3)
Chloride: 107 mmol/L (ref 98–111)
Creatinine, Ser: 4.08 mg/dL — ABNORMAL HIGH (ref 0.61–1.24)
GFR calc Af Amer: 18 mL/min — ABNORMAL LOW (ref >60)
GFR calc non Af Amer: 15 mL/min — ABNORMAL LOW (ref >60)
Glucose, Bld: 237 mg/dL — ABNORMAL HIGH (ref 70–99)
Potassium: 4.1 mmol/L (ref 3.5–5.1)
Sodium: 135 mmol/L (ref 135–145)

## 2019-01-31 LAB — CBC
HCT: 36.6 % — ABNORMAL LOW (ref 39.0–52.0)
Hemoglobin: 11.3 g/dL — ABNORMAL LOW (ref 13.0–17.0)
MCH: 27.5 pg (ref 26.0–34.0)
MCHC: 30.9 g/dL (ref 30.0–36.0)
MCV: 89.1 fL (ref 80.0–100.0)
Platelets: 342 10*3/uL (ref 150–400)
RBC: 4.11 MIL/uL — ABNORMAL LOW (ref 4.22–5.81)
RDW: 18.4 % — ABNORMAL HIGH (ref 11.5–15.5)
WBC: 7.3 10*3/uL (ref 4.0–10.5)
nRBC: 0 % (ref 0.0–0.2)

## 2019-01-31 LAB — URINALYSIS, ROUTINE W REFLEX MICROSCOPIC

## 2019-01-31 LAB — URINALYSIS, MICROSCOPIC (REFLEX)
RBC / HPF: >50 RBC/hpf (ref 0–5)
Squamous Epithelial / HPF: NONE SEEN (ref 0–5)

## 2019-01-31 NOTE — ED Triage Notes (Signed)
Pt arrives via EMS from home with blood in his urine. Pt has catheter. Pt not aware of why he has a catheter. Pt A&Ox4.

## 2019-01-31 NOTE — ED Provider Notes (Signed)
[facility] EMERGENCY DEPARTMENT Provider Note   CSN: [id number] Arrival date & time: 01/31/19  1707     History Chief Complaint  Patient presents with  . Hematuria    Timothy Madden is a 59 y.o. male.  The history is provided by the patient and medical records.  Hematuria   59 y.o. 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  States he first noticed this yesterday.  He denies any pain with this, just seeing lots of blood in the urine bag.  Catheter has continued draining well, no issues with leaking, etc.  No fevers/chills, states otherwise he feels fine.  He thinks his catheter was changed last week-- unable to tell me if the whole catheter was changed or just the tubing/bag.  Past Medical History:  Diagnosis Date  . Amputation of finger, left   . Complication of anesthesia   . Diabetes mellitus without complication ([place])   . DKA (diabetic ketoacidoses) ([place]) 06/2018  . PONV (postoperative nausea and vomiting)     Patient Active Problem List   Diagnosis Date Noted  . Hyperkalemia   . Hyperglycemia   . Pyelonephritis   . Pressure injury of skin 12/15/2018  . Protein-calorie malnutrition, severe 12/11/2018  . Acute lower UTI 12/11/2018  . Bacteremia due to Gram-negative bacteria 12/11/2018  . Bilateral hydronephrosis 12/11/2018  . DVT (deep venous thrombosis) ([place]) 12/11/2018  . Metabolic acidosis 09/47/0962  . Anemia due to chronic kidney disease 12/11/2018  . DKA, type 1, not at goal [facility]) 12/08/2018  . 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 Acute stress disorder 11/14/2018  . Leg swelling 11/10/2018  . DKA, type 2 ([place]) 06/28/2018  . Acute renal failure superimposed on chronic kidney disease ([place]) 06/28/2018  . Elevated lipase 06/28/2018  . 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 Osteomyelitis ([place]) 10/26/2013  . Sepsis ([place]) 10/26/2013   . AKI (acute kidney injury) ([place]) 10/26/2013  . Hyponatremia 10/26/2013  . Type I diabetes mellitus with complication, uncontrolled ([place]) 10/26/2013  . Essential hypertension 10/26/2013  . Anemia of chronic disease 10/26/2013  . PSYCHIATRIC DISORDER 07/19/2006  . TOBACCO ABUSE 07/19/2006  . Type 2 diabetes mellitus with stage 3 chronic kidney disease ([place]) 11/30/2005  . ANXIETY 11/30/2005    Past Surgical History:  Procedure Laterality Date  . I & D EXTREMITY Left 10/26/2013   Procedure: IRRIGATION AND DEBRIDEMENT EXTREMITY,PARTIAL AMPUTATION LEFT INDEX FINGER;  Surgeon: Dayna Barker, MD;  Location: [facility];  Service: Plastics;  Laterality: Left;  . I & D EXTREMITY Left 10/30/2013   Procedure: IRRIGATION AND DEBRIDEMENT AND REVISION AMPUTATION OF LEFT INDEX FINGER;  Surgeon: Dayna Barker, MD;  Location: [place];  Service: Plastics;  Laterality: Left;       Family History  Problem Relation Age of Onset  . Hypertension Mother   . Diabetes Father   . Diabetes Sister   . Diabetes Brother   . Heart disease Brother     Social History   Tobacco Use  . Smoking status: Current Every Day Smoker    Packs/day: 1.00    Years: 20.00    Pack years: 20.00    Types: Cigarettes  . Smokeless tobacco: Never Used  Substance Use Topics  . Alcohol use: No    Alcohol/week: 0.0 standard drinks  . Drug use: No    Home Medications Prior to Admission medications  Medication Sig Start Date End Date Taking? Authorizing Provider  apixaban (ELIQUIS) 5 MG TABS tablet Take 1 tablet (5 mg total) by mouth 2 (two) times daily. 01/18/19 03/19/19  Cherylann Ratel A, DO  blood glucose meter kit and supplies KIT Dispense based on patient and insurance preference. Use up to four times daily as directed. (FOR ICD-9 250.00, 250.01). 07/02/18   Aline August, MD  Blood Glucose Monitoring Suppl DEVI Glucometer. Also testing strips and lancets quantity sufficient for BID testing for 90 days. 01/19/19   Cherylann Ratel A, DO   insulin aspart (NOVOLOG) 100 UNIT/ML injection Inject 8 Units into the skin 3 (three) times daily with meals. 01/18/19 04/11/19  Cherylann Ratel A, DO  insulin detemir (LEVEMIR) 100 UNIT/ML injection Inject 0.1 mLs (10 Units total) into the skin at bedtime. 01/18/19   Cherylann Ratel A, DO  Insulin Syringe-Needle U-100 (INSULIN SYRINGE .5CC/31GX5/16") 31G X 5/16" 0.5 ML MISC Levemir 10 units daily and NovoLog 12 units 3 times a day with meals 01/18/19   Marylyn Ishihara, [place] A, DO  mirtazapine (REMERON) 7.5 MG tablet Take 1 tablet (7.5 mg total) by mouth at bedtime. 01/18/19 03/19/19  Cherylann Ratel A, DO  nitroGLYCERIN (NITROSTAT) 0.4 MG SL tablet Place 1 tablet (0.4 mg total) under the tongue every 5 (five) minutes as needed for up to 40 doses for chest pain. 01/18/19   Cherylann Ratel A, DO  pantoprazole (PROTONIX) 40 MG tablet Take 1 tablet (40 mg total) by mouth daily. 01/18/19 02/17/19  Cherylann Ratel A, DO    Allergies    Patient has no known allergies.  Review of Systems   Review of Systems  Genitourinary: Positive for hematuria.  All other systems reviewed and are negative.   Physical Exam Updated Vital Signs BP 117/80 (BP Location: Left Arm)   Pulse (!) 104   Temp 98.6 F (37 C) (Oral)   Resp 16   Ht 6' 3" (1.905 m)   Wt 50 kg   SpO2 100%   BMI 13.78 kg/m   Physical Exam Vitals and nursing note reviewed.  Constitutional:      Appearance: He is well-developed.  HENT:     Head: Normocephalic and atraumatic.  Eyes:     Conjunctiva/sclera: Conjunctivae normal.     Pupils: Pupils are equal, round, and reactive to light.  Cardiovascular:     Rate and Rhythm: Normal rate and regular rhythm.     Heart sounds: Normal heart sounds.  Pulmonary:     Effort: Pulmonary effort is normal.     Breath sounds: Normal breath sounds.  Abdominal:     General: Bowel sounds are normal.     Palpations: Abdomen is soft.  Genitourinary:    Comments: Foley in place, no leaking around tubing but does appear dirty, gross  hematuria in urine collection bag Musculoskeletal:        General: Normal range of motion.     Cervical back: Normal range of motion.  Skin:    General: Skin is warm and dry.  Neurological:     Mental Status: He is alert and oriented to person, place, and time.     ED Results / Procedures / Treatments   Labs (all labs ordered are listed, but only abnormal results are displayed) Labs Reviewed  URINALYSIS, ROUTINE W REFLEX MICROSCOPIC - Abnormal; Notable for the following components:      Result Value   Color, Urine RED (*)    APPearance TURBID (*)    Glucose,  UA   (*)    Value: TEST NOT REPORTED DUE TO COLOR INTERFERENCE OF URINE PIGMENT   Hgb urine dipstick   (*)    Value: TEST NOT REPORTED DUE TO COLOR INTERFERENCE OF URINE PIGMENT   Bilirubin Urine   (*)    Value: TEST NOT REPORTED DUE TO COLOR INTERFERENCE OF URINE PIGMENT   Ketones, ur   (*)    Value: TEST NOT REPORTED DUE TO COLOR INTERFERENCE OF URINE PIGMENT   Protein, ur   (*)    Value: TEST NOT REPORTED DUE TO COLOR INTERFERENCE OF URINE PIGMENT   Nitrite   (*)    Value: TEST NOT REPORTED DUE TO COLOR INTERFERENCE OF URINE PIGMENT   Leukocytes,Ua   (*)    Value: TEST NOT REPORTED DUE TO COLOR INTERFERENCE OF URINE PIGMENT   All other components within normal limits  CBC - Abnormal; Notable for the following components:   RBC 4.11 (*)    Hemoglobin 11.3 (*)    HCT 36.6 (*)    RDW 18.4 (*)    All other components within normal limits  BASIC METABOLIC PANEL - Abnormal; Notable for the following components:   CO2 20 (*)    Glucose, Bld 237 (*)    BUN 45 (*)    Creatinine, Ser 4.08 (*)    Calcium 8.8 (*)    GFR calc non Af Amer 15 (*)    GFR calc Af Amer 18 (*)    All other components within normal limits  URINALYSIS, MICROSCOPIC (REFLEX) - Abnormal; Notable for the following components:   Bacteria, UA RARE (*)    All other components within normal limits  URINALYSIS, ROUTINE W REFLEX MICROSCOPIC - Abnormal;  Notable for the following components:   Color, Urine AMBER (*)    APPearance HAZY (*)    Glucose, UA 150 (*)    Hgb urine dipstick MODERATE (*)    Protein, ur 100 (*)    Leukocytes,Ua TRACE (*)    RBC / HPF >50 (*)    WBC, UA >50 (*)    Bacteria, UA FEW (*)    All other components within normal limits  URINE CULTURE    EKG None  Radiology No results found.  Procedures Procedures (including critical care time)  Medications Ordered in ED Medications - No data to display  ED Course  I have reviewed the triage vital signs and the nursing notes.  Pertinent labs & imaging results that were available during my care of the patient were reviewed by me and considered in my medical decision making (see chart for details).    MDM Rules/Calculators/A&P  59 year old male here with hematuria.  Has indwelling Foley due to obstructive uropathy.  Reports this was changed last week, unable to tell me if this was the catheter or just the bag and tubing.  He is afebrile and nontoxic.  Does have bloody urine present in collection bag, no complication with catheter itself such as leakage or bleeding from the urethra.  He is not having any current abdominal pain, soft and benign on exam.  Labs overall reassuring, normal white count.  Creatinine is 4.08 which is around his baseline.  UA unable to be processed due to gross blood.  We will plan to exchange Foley catheter and irrigate.  Catheter was exchanged and irrigated with return of yellow urine.  Repeat sample sent, moderate hemoglobin, >50 WBC and RBC, few bacteria.  Culture pending.  Urine has started to show  some signs of hematuria again, patient remains asymptomatic of this.  His vitals remained stable with no clinical signs concerning for sepsis or disseminated infection.  Will give one-time dose of fosfomycin here pending urine culture and have him follow-up closely with urology.  He is on Eliquis which may be etiology of his painless hematuria.   He may return here for any new/acute changes.  Final Clinical Impression(s) / ED Diagnoses Final diagnoses:  Hematuria, unspecified type    Rx / DC Orders ED Discharge Orders    None       Larene Pickett, PA-C 02/01/19 1308    Veryl Speak, MD 02/01/19 [id number]

## 2019-02-01 LAB — URINALYSIS, ROUTINE W REFLEX MICROSCOPIC
Bilirubin Urine: NEGATIVE
Glucose, UA: 150 mg/dL — AB
Ketones, ur: NEGATIVE mg/dL
Nitrite: NEGATIVE
Protein, ur: 100 mg/dL — AB
RBC / HPF: >50 RBC/hpf — ABNORMAL HIGH (ref 0–5)
Specific Gravity, Urine: 1.008 (ref 1.005–1.030)
WBC, UA: >50 WBC/hpf — ABNORMAL HIGH (ref 0–5)
pH: 6 (ref 5.0–8.0)

## 2019-02-01 MED ORDER — FOSFOMYCIN TROMETHAMINE 3 G PO PACK
3.0000 g | PACK | Freq: Once | ORAL | Status: AC
  Administered 2019-02-01: 3 g via ORAL
  Filled 2019-02-01: qty 3

## 2019-02-01 NOTE — Discharge Instructions (Addendum)
Follow-up with urology office as soon as you can-- give them a call to arrange follow-up appt. Return here for any new/acute changes.

## 2019-02-01 NOTE — ED Notes (Signed)
Spoke to pt brother and informed him the pt is being discharged.

## 2019-02-01 NOTE — ED Notes (Signed)
Discharge instructions reviewed with pt. Pt verbalized understanding.   

## 2019-02-01 NOTE — ED Notes (Signed)
Pt foley catheter irrigated with 350 mL sterile water. Got return of clear 325 mL.

## 2019-02-02 LAB — URINE CULTURE: Culture: NO GROWTH

## 2019-04-08 ENCOUNTER — Other Ambulatory Visit: Payer: Self-pay

## 2019-04-08 ENCOUNTER — Emergency Department (HOSPITAL_COMMUNITY): Payer: Medicaid Other

## 2019-04-08 ENCOUNTER — Encounter (HOSPITAL_COMMUNITY): Admission: EM | Disposition: E | Payer: Self-pay | Source: Home / Self Care | Attending: Critical Care Medicine

## 2019-04-08 DIAGNOSIS — Z833 Family history of diabetes mellitus: Secondary | ICD-10-CM | POA: Diagnosis not present

## 2019-04-08 DIAGNOSIS — I472 Ventricular tachycardia: Secondary | ICD-10-CM | POA: Diagnosis present

## 2019-04-08 DIAGNOSIS — Z8249 Family history of ischemic heart disease and other diseases of the circulatory system: Secondary | ICD-10-CM | POA: Diagnosis not present

## 2019-04-08 DIAGNOSIS — N179 Acute kidney failure, unspecified: Secondary | ICD-10-CM | POA: Diagnosis not present

## 2019-04-08 DIAGNOSIS — N183 Chronic kidney disease, stage 3 unspecified: Secondary | ICD-10-CM | POA: Diagnosis present

## 2019-04-08 DIAGNOSIS — E1010 Type 1 diabetes mellitus with ketoacidosis without coma: Secondary | ICD-10-CM | POA: Diagnosis present

## 2019-04-08 DIAGNOSIS — F17210 Nicotine dependence, cigarettes, uncomplicated: Secondary | ICD-10-CM | POA: Diagnosis present

## 2019-04-08 DIAGNOSIS — N19 Unspecified kidney failure: Secondary | ICD-10-CM

## 2019-04-08 DIAGNOSIS — Z7901 Long term (current) use of anticoagulants: Secondary | ICD-10-CM

## 2019-04-08 DIAGNOSIS — R1084 Generalized abdominal pain: Secondary | ICD-10-CM | POA: Diagnosis not present

## 2019-04-08 DIAGNOSIS — I462 Cardiac arrest due to underlying cardiac condition: Secondary | ICD-10-CM | POA: Diagnosis present

## 2019-04-08 DIAGNOSIS — Z720 Tobacco use: Secondary | ICD-10-CM | POA: Diagnosis not present

## 2019-04-08 DIAGNOSIS — Z515 Encounter for palliative care: Secondary | ICD-10-CM | POA: Diagnosis not present

## 2019-04-08 DIAGNOSIS — E1122 Type 2 diabetes mellitus with diabetic chronic kidney disease: Secondary | ICD-10-CM | POA: Diagnosis not present

## 2019-04-08 DIAGNOSIS — I469 Cardiac arrest, cause unspecified: Secondary | ICD-10-CM | POA: Diagnosis not present

## 2019-04-08 DIAGNOSIS — I4891 Unspecified atrial fibrillation: Secondary | ICD-10-CM | POA: Diagnosis not present

## 2019-04-08 DIAGNOSIS — N189 Chronic kidney disease, unspecified: Secondary | ICD-10-CM

## 2019-04-08 DIAGNOSIS — J1282 Pneumonia due to coronavirus disease 2019: Secondary | ICD-10-CM | POA: Diagnosis present

## 2019-04-08 DIAGNOSIS — I249 Acute ischemic heart disease, unspecified: Secondary | ICD-10-CM | POA: Diagnosis not present

## 2019-04-08 DIAGNOSIS — Z66 Do not resuscitate: Secondary | ICD-10-CM | POA: Diagnosis not present

## 2019-04-08 DIAGNOSIS — I4510 Unspecified right bundle-branch block: Secondary | ICD-10-CM | POA: Diagnosis not present

## 2019-04-08 DIAGNOSIS — R404 Transient alteration of awareness: Secondary | ICD-10-CM | POA: Diagnosis not present

## 2019-04-08 DIAGNOSIS — D631 Anemia in chronic kidney disease: Secondary | ICD-10-CM | POA: Diagnosis present

## 2019-04-08 DIAGNOSIS — R918 Other nonspecific abnormal finding of lung field: Secondary | ICD-10-CM | POA: Diagnosis not present

## 2019-04-08 DIAGNOSIS — E1022 Type 1 diabetes mellitus with diabetic chronic kidney disease: Secondary | ICD-10-CM | POA: Diagnosis present

## 2019-04-08 DIAGNOSIS — Z794 Long term (current) use of insulin: Secondary | ICD-10-CM

## 2019-04-08 DIAGNOSIS — I213 ST elevation (STEMI) myocardial infarction of unspecified site: Secondary | ICD-10-CM | POA: Diagnosis not present

## 2019-04-08 DIAGNOSIS — I1310 Hypertensive heart and chronic kidney disease without heart failure, with stage 1 through stage 4 chronic kidney disease, or unspecified chronic kidney disease: Secondary | ICD-10-CM | POA: Diagnosis present

## 2019-04-08 DIAGNOSIS — N185 Chronic kidney disease, stage 5: Secondary | ICD-10-CM | POA: Diagnosis not present

## 2019-04-08 DIAGNOSIS — R0489 Hemorrhage from other sites in respiratory passages: Secondary | ICD-10-CM | POA: Diagnosis present

## 2019-04-08 DIAGNOSIS — Z86718 Personal history of other venous thrombosis and embolism: Secondary | ICD-10-CM | POA: Diagnosis not present

## 2019-04-08 DIAGNOSIS — U071 COVID-19: Secondary | ICD-10-CM | POA: Diagnosis present

## 2019-04-08 DIAGNOSIS — E1165 Type 2 diabetes mellitus with hyperglycemia: Secondary | ICD-10-CM | POA: Diagnosis not present

## 2019-04-08 DIAGNOSIS — J9601 Acute respiratory failure with hypoxia: Secondary | ICD-10-CM | POA: Diagnosis present

## 2019-04-08 DIAGNOSIS — E1110 Type 2 diabetes mellitus with ketoacidosis without coma: Secondary | ICD-10-CM

## 2019-04-08 DIAGNOSIS — E872 Acidosis: Secondary | ICD-10-CM

## 2019-04-08 DIAGNOSIS — R52 Pain, unspecified: Secondary | ICD-10-CM | POA: Diagnosis not present

## 2019-04-08 DIAGNOSIS — Z4682 Encounter for fitting and adjustment of non-vascular catheter: Secondary | ICD-10-CM | POA: Diagnosis not present

## 2019-04-08 LAB — I-STAT CHEM 8, ED
BUN: 110 mg/dL — ABNORMAL HIGH (ref 6–20)
Calcium, Ion: 1.09 mmol/L — ABNORMAL LOW (ref 1.15–1.40)
Chloride: 104 mmol/L (ref 98–111)
Creatinine, Ser: 7.7 mg/dL — ABNORMAL HIGH (ref 0.61–1.24)
Glucose, Bld: 613 mg/dL (ref 70–99)
HCT: 47 % (ref 39.0–52.0)
Hemoglobin: 16 g/dL (ref 13.0–17.0)
Potassium: 4.9 mmol/L (ref 3.5–5.1)
Sodium: 130 mmol/L — ABNORMAL LOW (ref 135–145)
TCO2: 12 mmol/L — ABNORMAL LOW (ref 22–32)

## 2019-04-08 LAB — POCT I-STAT 7, (LYTES, BLD GAS, ICA,H+H)
Acid-base deficit: 17 mmol/L — ABNORMAL HIGH (ref 0.0–2.0)
Bicarbonate: 13.7 mmol/L — ABNORMAL LOW (ref 20.0–28.0)
Calcium, Ion: 1.25 mmol/L (ref 1.15–1.40)
HCT: 37 % — ABNORMAL LOW (ref 39.0–52.0)
Hemoglobin: 12.6 g/dL — ABNORMAL LOW (ref 13.0–17.0)
O2 Saturation: 99 %
Patient temperature: 95.1
Potassium: 4.2 mmol/L (ref 3.5–5.1)
Sodium: 136 mmol/L (ref 135–145)
TCO2: 15 mmol/L — ABNORMAL LOW (ref 22–32)
pCO2 arterial: 49.3 mmHg — ABNORMAL HIGH (ref 32.0–48.0)
pH, Arterial: 7.038 — CL (ref 7.350–7.450)
pO2, Arterial: 219 mmHg — ABNORMAL HIGH (ref 83.0–108.0)

## 2019-04-08 LAB — CBC
HCT: 47.2 % (ref 39.0–52.0)
Hemoglobin: 13.9 g/dL (ref 13.0–17.0)
MCH: 26.4 pg (ref 26.0–34.0)
MCHC: 29.4 g/dL — ABNORMAL LOW (ref 30.0–36.0)
MCV: 89.6 fL (ref 80.0–100.0)
Platelets: 160 10*3/uL (ref 150–400)
RBC: 5.27 MIL/uL (ref 4.22–5.81)
RDW: 14.6 % (ref 11.5–15.5)
WBC: 9.4 10*3/uL (ref 4.0–10.5)
nRBC: 0.2 % (ref 0.0–0.2)

## 2019-04-08 LAB — RESPIRATORY PANEL BY RT PCR (FLU A&B, COVID)
Influenza A by PCR: NEGATIVE
Influenza B by PCR: NEGATIVE
SARS Coronavirus 2 by RT PCR: POSITIVE — AB

## 2019-04-08 LAB — CBG MONITORING, ED: Glucose-Capillary: 595 mg/dL (ref 70–99)

## 2019-04-08 SURGERY — CORONARY/GRAFT ACUTE MI REVASCULARIZATION
Anesthesia: LOCAL

## 2019-04-08 MED ORDER — FENTANYL CITRATE (PF) 100 MCG/2ML IJ SOLN: 50.0000 ug | INTRAMUSCULAR | Status: DC | PRN

## 2019-04-08 MED ORDER — AMIODARONE HCL 150 MG/3ML IV SOLN
INTRAVENOUS | Status: DC | PRN
  Administered 2019-04-08: 300 mg via INTRAVENOUS
  Administered 2019-04-08: 150 mg via INTRAVENOUS

## 2019-04-08 MED ORDER — SODIUM CHLORIDE 0.9 % IV SOLN: INTRAVENOUS | Status: DC

## 2019-04-08 MED ORDER — IOHEXOL 350 MG/ML SOLN
INTRAVENOUS | Status: AC
  Filled 2019-04-08: qty 1

## 2019-04-08 MED ORDER — ETOMIDATE 2 MG/ML IV SOLN: INTRAVENOUS | Status: DC | PRN

## 2019-04-08 MED ORDER — DEXTROSE 50 % IV SOLN: 0.0000 mL | INTRAVENOUS | Status: DC | PRN

## 2019-04-08 MED ORDER — FENTANYL CITRATE (PF) 100 MCG/2ML IJ SOLN
INTRAMUSCULAR | Status: AC
  Administered 2019-04-08: 50 ug via INTRAVENOUS
  Filled 2019-04-08: qty 2

## 2019-04-08 MED ORDER — NOREPINEPHRINE 4 MG/250ML-% IV SOLN: 0.0000 ug/min | INTRAVENOUS | Status: DC

## 2019-04-08 MED ORDER — DEXAMETHASONE SODIUM PHOSPHATE 10 MG/ML IJ SOLN: 6.0000 mg | Freq: Every day | INTRAMUSCULAR | Status: DC

## 2019-04-08 MED ORDER — FENTANYL CITRATE (PF) 100 MCG/2ML IJ SOLN
50.0000 ug | INTRAMUSCULAR | Status: DC | PRN
  Administered 2019-04-08: 50 ug via INTRAVENOUS

## 2019-04-08 MED ORDER — INSULIN REGULAR(HUMAN) IN NACL 100-0.9 UT/100ML-% IV SOLN: INTRAVENOUS | Status: DC

## 2019-04-08 MED ORDER — MIDAZOLAM HCL 2 MG/2ML IJ SOLN
INTRAMUSCULAR | Status: AC
  Administered 2019-04-08: 2.5 mg via INTRAVENOUS
  Filled 2019-04-08: qty 4

## 2019-04-08 MED ORDER — MIDAZOLAM HCL 2 MG/2ML IJ SOLN: 2.5000 mg | INTRAMUSCULAR | Status: DC | PRN

## 2019-04-08 MED ORDER — PANTOPRAZOLE SODIUM 40 MG IV SOLR: 40.0000 mg | Freq: Every day | INTRAVENOUS | Status: DC

## 2019-04-08 MED ORDER — CALCIUM CHLORIDE 10 % IV SOLN
INTRAVENOUS | Status: DC | PRN
  Administered 2019-04-08: 1 g via INTRAVENOUS

## 2019-04-08 MED ORDER — SODIUM CHLORIDE 0.9 % IV SOLN
INTRAVENOUS | Status: DC | PRN
  Administered 2019-04-08: 1000 mL via INTRAVENOUS

## 2019-04-08 MED ORDER — MIDAZOLAM HCL 2 MG/2ML IJ SOLN: 2.0000 mg | INTRAMUSCULAR | Status: DC | PRN

## 2019-04-08 MED ORDER — HEPARIN (PORCINE) IN NACL 1000-0.9 UT/500ML-% IV SOLN
INTRAVENOUS | Status: AC
  Filled 2019-04-08: qty 1000

## 2019-04-08 MED ORDER — DEXTROSE-NACL 5-0.45 % IV SOLN: INTRAVENOUS | Status: DC

## 2019-04-08 MED ORDER — EPINEPHRINE 1 MG/10ML IJ SOSY
PREFILLED_SYRINGE | INTRAMUSCULAR | Status: DC | PRN
  Administered 2019-04-08 (×6): 1 via INTRAVENOUS

## 2019-04-08 MED ORDER — LIDOCAINE HCL (PF) 1 % IJ SOLN
INTRAMUSCULAR | Status: AC
  Filled 2019-04-08: qty 30

## 2019-04-08 MED ORDER — NOREPINEPHRINE 4 MG/250ML-% IV SOLN
INTRAVENOUS | Status: AC
  Administered 2019-04-08: 10 ug/min via INTRAVENOUS
  Filled 2019-04-08: qty 250

## 2019-04-08 MED ORDER — EPINEPHRINE HCL 5 MG/250ML IV SOLN IN NS
0.5000 ug/min | INTRAVENOUS | Status: DC
  Administered 2019-04-08: 20 ug/min via INTRAVENOUS

## 2019-04-08 MED ORDER — SODIUM BICARBONATE 8.4 % IV SOLN
INTRAVENOUS | Status: DC | PRN
  Administered 2019-04-08 (×3): 50 meq via INTRAVENOUS

## 2019-04-08 NOTE — Consult Note (Addendum)
History from [facility] and from ED staff.  Called for code STEMI.  Initial ECG showed ST elevation in the inferior leads and anterior leads.  Patient apparently was having abdominal pain.  Per the available history, he was initially walking.  Per report from [facility], he subsequently had respiratory failure requiring bag ventilation from EMS.  Blood pressure at the time was 626 systolic.  There was mention of a colostomy bag which broke.  After exam, this turned out to be a bag connected to a Foley catheter.  During transport to the hospital, he had a cardiac arrest.  He was initially in PEA rhythm.  He had approximately 15 minutes of CPR before ROSC.  While in the ER, he again lost pulses.  CPR was resumed for approximately another 15 minutes.  Pulse was restored with high-dose epinephrine and ultimately epinephrine drip.  Repeat ECG showed a wide, escape rhythm withy underlying atrial fibrillation.  The ST elevation which had been present on the initial ECG was not noted, but the QRS complex was completely different.  Past medical history noted below is all from the chart.  Unable to get any history from the patient due to his cardiac arrest and need for intubation.  He is an active smoker per the chart.  GEN: Frail, intubated, sedated HEENT: Unable to assess Neck: no JVD, carotid bruits, or masses Cardiac: Irregular rhythm, faint femoral pulse-which diminished as epinephrine was wearing off, no radial pulse, Respiratory: Coarse breath sounds GI: soft, nontender, nondistended, no evidence of colostomy. MS: Intraosseous access noted Skin: warm and dry, no rash Neuro: Unable to assess Psych: Unable to assess  Bedside echo reviewed.  It did appear that his LV function looked preserved in the short axis view.  His right ventricle did appear dilated with very sluggish flow/stasis noted.  ECG as noted above  Assessment and plan:  1) cardiac arrest: Initial ECG showed STEMI.  Patient with  multiple medical comorbidities and psychiatric issues as well as issues of noncompliance.  After review of the chart, he has had problems with DKA and poorly controlled diabetes with hemoglobin A1c of 14.  Today, glucose was 613.  He was placed on anticoagulation for DVT in December 2020, but has a history of noncompliance.  He has had psychiatric issues as well.  In addition, he is in acute on chronic renal failure.  Creatinine today 7.7.  Given all of the medical comorbidities, and prolonged CPR as well as difficulty maintaining a blood pressure and a pulse, I would not take him for emergency cardiac catheterization.  I think it is unlikely we would be able to help him make a reasonable recovery with cardiac cath and intervention.  I did speak with Dr. Roslynn Amble.  Treatment with anticoagulation is reasonable given the possibility that this is from a pulmonary embolism-especially given the bedside ultrasound findings.  It is certainly possible that he may not have been taking his Eliquis as prescribed.  Overall, I think his prognosis is poor.  Dr. Roslynn Amble will try to find family as well.  CRITICAL CARE Performed by: Larae Grooms   Total critical care time: 45 minutes  Critical care time was exclusive of separately billable procedures and treating other patients.  Critical care was necessary to treat or prevent imminent or life-threatening deterioration.  Critical care was time spent personally by me on the following activities: development of treatment plan with patient and/or surrogate as well as nursing, discussions with consultants, evaluation of patient's  response to treatment, examination of patient, obtaining history from patient or surrogate, ordering and performing treatments and interventions, ordering and review of laboratory studies, ordering and review of radiographic studies, pulse oximetry and re-evaluation of patient's condition.  Jettie Booze, MD

## 2019-04-08 NOTE — ED Notes (Signed)
Noted blood from airway. No initial blood noted during intubation

## 2019-04-08 NOTE — ED Triage Notes (Signed)
Pt BIB [facility] from home initial c/o abdominal cramping and problems with colostomy bag. On EMS arrival Pt with abnormal EKG called CODE STEMI. pt then witnessed arrest. CPR initiated by EMS and continues for approx 15 minute. PEA 100-110, no shockable rhythm, Pulses present per EMS on arrival, Epi x3, Last Epi 10:02.   Pt continued compression on lucus, king airway in placed, bagged.

## 2019-04-08 NOTE — Progress Notes (Signed)
  Chaplain responded to STEMI, apparent CPR in progress. Pt not available, no family present.  Please contact if Spiritual Care services are needed.  Luana Shu [phone]    04/05/2019 2200  Clinical Encounter Type  Visited With Patient not available  Visit Type Initial;Critical Care  Referral From Care management  Consult/Referral To Chaplain  Stress Factors  Patient Stress Factors Health changes

## 2019-04-08 NOTE — ED Provider Notes (Signed)
[facility] EMERGENCY DEPARTMENT Provider Note   CSN: [id number] Arrival date & time: 03/21/2019  2203     History Chief Complaint  Patient presents with  . Code STEMI  . Cardiac Arrest    Timothy Madden is a 59 y.o. male.  Presents to ER after cardiac arrest, code STEMI.  EMS reported called to the home due to abdominal cramping, problems with Foley bag.  EMS reported EKG concerning for STEMI, then witnessed cardiac arrest.  EMS reported PEA, then asystole.  Epi x3.  King airway.  History limited due to acuity.  HPI     Past Medical History:  Diagnosis Date  . Amputation of finger, left   . Complication of anesthesia   . Diabetes mellitus without complication ([place])   . DKA (diabetic ketoacidoses) ([place]) 06/2018  . PONV (postoperative nausea and vomiting)     Patient Active Problem List   Diagnosis Date Noted  . Pneumonia due to COVID-19 virus   . Pulmonary hemorrhage   . Acute encephalopathy   . Cardiac arrest ([place]) 03/30/2019  . Hyperkalemia   . Hyperglycemia   . Pyelonephritis   . Pressure injury of skin 12/15/2018  . Protein-calorie malnutrition, severe 12/11/2018  . Acute lower UTI 12/11/2018  . Bacteremia due to Gram-negative bacteria 12/11/2018  . Bilateral hydronephrosis 12/11/2018  . DVT (deep venous thrombosis) ([place]) 12/11/2018  . Metabolic acidosis 85/92/9244  . Anemia due to chronic kidney disease 12/11/2018  . DKA, type 1, not at goal [facility]) 12/08/2018  . 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 Acute stress disorder 11/14/2018  . Leg swelling 11/10/2018  . Diabetic acidosis ([place]) 06/28/2018  . Acute kidney injury superimposed on chronic kidney disease ([employer]) 06/28/2018  . Elevated lipase 06/28/2018  . 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 Osteomyelitis ([place]) 10/26/2013  . Sepsis ([place]) 10/26/2013  . AKI (acute kidney injury) ([place]) 10/26/2013  . Hyponatremia 10/26/2013  . Type I diabetes mellitus with complication,  uncontrolled ([place]) 10/26/2013  . Essential hypertension 10/26/2013  . Anemia of chronic disease 10/26/2013  . PSYCHIATRIC DISORDER 07/19/2006  . TOBACCO ABUSE 07/19/2006  . Type 2 diabetes mellitus with stage 3 chronic kidney disease ([place]) 11/30/2005  . ANXIETY 11/30/2005    Past Surgical History:  Procedure Laterality Date  . I & D EXTREMITY Left 10/26/2013   Procedure: IRRIGATION AND DEBRIDEMENT EXTREMITY,PARTIAL AMPUTATION LEFT INDEX FINGER;  Surgeon: Dayna Barker, MD;  Location: [place];  Service: Plastics;  Laterality: Left;  . I & D EXTREMITY Left 10/30/2013   Procedure: IRRIGATION AND DEBRIDEMENT AND REVISION AMPUTATION OF LEFT INDEX FINGER;  Surgeon: Dayna Barker, MD;  Location: [place];  Service: Plastics;  Laterality: Left;       Family History  Problem Relation Age of Onset  . Hypertension Mother   . Diabetes Father   . Diabetes Sister   . Diabetes Brother   . Heart disease Brother     Social History   Tobacco Use  . Smoking status: Current Every Day Smoker    Packs/day: 1.00    Years: 20.00    Pack years: 20.00    Types: Cigarettes  . Smokeless tobacco: Never Used  Substance Use Topics  . Alcohol use: No    Alcohol/week: 0.0 standard drinks  . Drug use: No    Home Medications Prior to Admission medications   Medication Sig Start Date End Date Taking? Authorizing Provider  apixaban (ELIQUIS) 5 MG TABS tablet Take 1 tablet (  5 mg total) by mouth 2 (two) times daily. 01/18/19 03/19/19  Cherylann Ratel A, DO  blood glucose meter kit and supplies KIT Dispense based on patient and insurance preference. Use up to four times daily as directed. (FOR ICD-9 250.00, 250.01). 07/02/18   Aline August, MD  Blood Glucose Monitoring Suppl DEVI Glucometer. Also testing strips and lancets quantity sufficient for BID testing for 90 days. 01/19/19   Cherylann Ratel A, DO  insulin aspart (NOVOLOG) 100 UNIT/ML injection Inject 8 Units into the skin 3 (three) times daily with meals. 01/18/19  04/11/19  Cherylann Ratel A, DO  insulin detemir (LEVEMIR) 100 UNIT/ML injection Inject 0.1 mLs (10 Units total) into the skin at bedtime. 01/18/19   Cherylann Ratel A, DO  Insulin Syringe-Needle U-100 (INSULIN SYRINGE .5CC/31GX5/16") 31G X 5/16" 0.5 ML MISC Levemir 10 units daily and NovoLog 12 units 3 times a day with meals 01/18/19   Marylyn Ishihara, Tyrone A, DO  mirtazapine (REMERON) 7.5 MG tablet Take 1 tablet (7.5 mg total) by mouth at bedtime. 01/18/19 03/19/19  Cherylann Ratel A, DO  nitroGLYCERIN (NITROSTAT) 0.4 MG SL tablet Place 1 tablet (0.4 mg total) under the tongue every 5 (five) minutes as needed for up to 40 doses for chest pain. 01/18/19   Cherylann Ratel A, DO  pantoprazole (PROTONIX) 40 MG tablet Take 1 tablet (40 mg total) by mouth daily. 01/18/19 02/17/19  Cherylann Ratel A, DO    Allergies    Patient has no known allergies.  Review of Systems   Review of Systems  Unable to perform ROS: Acuity of condition    Physical Exam Updated Vital Signs BP (!) 89/67   Pulse (!) 43   Temp (!) 95.1 F (35.1 C) (Temporal)   Resp (!) 23   Ht 6' (1.829 m)   Wt 50 kg   SpO2 98%   BMI 14.95 kg/m   Physical Exam Constitutional:      Comments: Unresponsive, lying in bed, active chest compressions via [place] Rump  HENT:     Head: Normocephalic and atraumatic.     Nose: Nose normal.     Mouth/Throat:     Mouth: Mucous membranes are moist.     Pharynx: Oropharynx is clear.  Eyes:     Comments: Pupils 5 mm equal bilateral  Cardiovascular:     Comments: Initial pulse check regular, tachycardic, palpable femoral pulse Pulmonary:     Comments: Breath sounds bilateral via King airway bagging Abdominal:     General: Abdomen is flat. There is no distension.     Palpations: Abdomen is soft. There is no mass.  Genitourinary:    Comments: Normal-appearing penis, Foley catheter in place Musculoskeletal:        General: No deformity or signs of injury.     Cervical back: Normal range of motion.  Skin:    General: Skin is  warm and dry.     Capillary Refill: Capillary refill takes 2 to 3 seconds.  Neurological:     Comments: Unresponsive, no response to painful stimuli, no verbal response, no eye response     ED Results / Procedures / Treatments   Labs (all labs ordered are listed, but only abnormal results are displayed) Labs Reviewed  RESPIRATORY PANEL BY RT PCR (FLU A&B, COVID) - Abnormal; Notable for the following components:      Result Value   SARS Coronavirus 2 by RT PCR POSITIVE (*)    All other components within normal limits  CBC - Abnormal;  Notable for the following components:   MCHC 29.4 (*)    All other components within normal limits  I-STAT CHEM 8, ED - Abnormal; Notable for the following components:   Sodium 130 (*)    BUN 110 (*)    Creatinine, Ser 7.70 (*)    Glucose, Bld 613 (*)    Calcium, Ion 1.09 (*)    TCO2 12 (*)    All other components within normal limits  CBG MONITORING, ED - Abnormal; Notable for the following components:   Glucose-Capillary 595 (*)    All other components within normal limits  POCT I-STAT 7, (LYTES, BLD GAS, ICA,H+H) - Abnormal; Notable for the following components:   pH, Arterial 7.038 (*)    pCO2 arterial 49.3 (*)    pO2, Arterial 219.0 (*)    Bicarbonate 13.7 (*)    TCO2 15 (*)    Acid-base deficit 17.0 (*)    HCT 37.0 (*)    Hemoglobin 12.6 (*)    All other components within normal limits  I-STAT ARTERIAL BLOOD GAS, ED  TROPONIN I (HIGH SENSITIVITY)    EKG None  EKG at 2222 shows undetermined rhythm, possibly atrial fibrillation, right bundle branch block, LVH with secondary repolarization abnormality   Radiology DG Chest Portable 1 View  Result Date: 04/10/2019 CLINICAL DATA:  Recent STEMI, post CPR with tube placement EXAM: PORTABLE CHEST 1 VIEW COMPARISON:  12/18/2018 FINDINGS: Cardiac shadows within normal limits. Gastric catheter is noted within the stomach. Endotracheal tube is noted in satisfactory position 5.3 cm above the  carina. The lungs are well aerated bilaterally. Diffuse patchy infiltrate is noted in the mid and upper lung on the left and to a lesser degree in the right perihilar region. IMPRESSION: New patchy opacities in both lungs left greater than right. Endotracheal tube and gastric catheter are noted in satisfactory position. Electronically Signed   By: Inez Catalina M.D.   On: 03/15/2019 22:52    Procedures .Critical Care Performed by: Lucrezia Starch, MD Authorized by: Lucrezia Starch, MD   Critical care provider statement:    Critical care time (minutes):  92   Critical care was necessary to treat or prevent imminent or life-threatening deterioration of the following conditions:  Cardiac failure, respiratory failure and shock   Critical care was time spent personally by me on the following activities:  Discussions with consultants, evaluation of patient's response to treatment, examination of patient, ordering and performing treatments and interventions, ordering and review of laboratory studies, ordering and review of radiographic studies, pulse oximetry, re-evaluation of patient's condition, obtaining history from patient or surrogate and review of old charts CPR  Date/Time: 05-02-19 12:07 AM Performed by: Lucrezia Starch, MD Authorized by: Lucrezia Starch, MD  CPR Procedure Details:    ACLS/BLS initiated by EMS: Yes     CPR/ACLS performed in the ED: Yes     Outcome: ROSC obtained    CPR performed via ACLS guidelines under my direct supervision.  See RN documentation for details including defibrillator use, medications, doses and timing. Comments:     Multiple cardiac arrests, rhythms included PEA, asystole, V. tach, interventions included multiple doses of epinephrine, amiodarone, defibrillation, intubation - see nursing notes for total duration of CPR provided Procedure Name: Intubation Date/Time: 2019/05/02 12:09 AM Performed by: Lucrezia Starch, MD Pre-anesthesia  Checklist: Timeout performed Oxygen Delivery Method: Ambu bag Preoxygenation: Pre-oxygenation with 100% oxygen Ventilation: Mask ventilation without difficulty Laryngoscope Size: Glidescope Grade View: Grade I Tube  size: 7.5 mm Number of attempts: 2 Placement Confirmation: ETT inserted through vocal cords under direct vision,  Positive ETCO2 and Breath sounds checked- equal and bilateral Secured at: 25 cm Tube secured with: ETT holder Comments: Intubated during active CPR, performed by Nadeen Landau MD PGY-2 EM under my direct supervision    .Cardioversion  Date/Time: 2019/04/26 12:11 AM Performed by: Lucrezia Starch, MD Authorized by: Lucrezia Starch, MD   Consent:    Consent obtained:  Emergent situation Pre-procedure details:    Cardioversion basis:  Emergent Attempt one:    Cardioversion mode:  Synchronous   Shock (Joules):  200   Shock outcome:  Conversion to other rhythm Post-procedure details:    Patient status:  Unresponsive Comments:     Post ROSC, patient with V. tach with pulse, synchronized cardioverted x1 at 200 J   .Central Line  Date/Time: Apr 26, 2019 12:13 AM Performed by: Lucrezia Starch, MD Authorized by: Lucrezia Starch, MD   Consent:    Consent obtained:  Emergent situation Pre-procedure details:    Hand hygiene: Hand hygiene performed prior to insertion     Skin preparation:  2% chlorhexidine Anesthesia (see MAR for exact dosages):    Anesthesia method:  None Procedure details:    Location:  R femoral   Site selection rationale:  Peri-code, need for emergent access   Patient position:  Flat   Procedural supplies:  Triple lumen   Catheter size:  7 Fr   Landmarks identified: yes     Ultrasound guidance: yes     Sterile ultrasound techniques: Sterile gel and sterile probe covers were used     Number of attempts:  1   Successful placement: yes   Post-procedure details:    Post-procedure:  Dressing applied and line sutured    Assessment:  Blood return through all ports   Patient tolerance of procedure:  Tolerated well, no immediate complications ARTERIAL LINE  Date/Time: 2019-04-26 12:14 AM Performed by: Lucrezia Starch, MD Authorized by: Lucrezia Starch, MD   Consent:    Consent obtained:  Verbal Pre-procedure details:    Skin preparation:  2% Chlorhexidine Anesthesia (see MAR for exact dosages):    Anesthesia method:  None Procedure details:    Location:  R femoral   Allen's test performed: no     Needle gauge:  20 G   Placement technique:  Seldinger and ultrasound guided   Number of attempts:  1   Transducer: waveform confirmed   Post-procedure details:    Post-procedure:  Biopatch applied, sutured and sterile dressing applied   Patient tolerance of procedure:  Tolerated well, no immediate complications   (including critical care time)  Medications Ordered in ED Medications  dextrose 5 % solution (has no administration in time range)  acetaminophen (TYLENOL) tablet 650 mg (has no administration in time range)    Or  acetaminophen (TYLENOL) suppository 650 mg (has no administration in time range)  diphenhydrAMINE (BENADRYL) injection 25 mg (has no administration in time range)  glycopyrrolate (ROBINUL) tablet 1 mg (has no administration in time range)    Or  glycopyrrolate (ROBINUL) injection 0.2 mg (has no administration in time range)    Or  glycopyrrolate (ROBINUL) injection 0.2 mg (has no administration in time range)  polyvinyl alcohol (LIQUIFILM TEARS) 1.4 % ophthalmic solution 1 drop (has no administration in time range)  morphine 2 MG/ML injection 2 mg (has no administration in time range)  morphine bolus via infusion 5 mg (has no  administration in time range)  morphine 143m in NS 1086m(28m39mL) infusion - premix (has no administration in time range)  fentaNYL (SUBLIMAZE) 100 MCG/2ML injection (has no administration in time range)  norepinephrine (LEVOPHED) 4-5 MG/250ML-%  infusion SOLN (20 mcg/min  Rate/Dose Change 04/07/2019 2247)    ED Course  I have reviewed the triage vital signs and the nursing notes.  Pertinent labs & imaging results that were available during my care of the patient were reviewed by me and considered in my medical decision making (see chart for details).    MDM Rules/Calculators/A&P                      [age]ar old male presenting to ER active code.  On arrival here, ROSC was obtained but patient subsequently had multiple code events.  Received multiple doses of epinephrine, bicarb, calcium, defibrillation.  Rhythms included primarily PEA or asystole but was in V. tach for which received defibrillation, post ROSC still in V. tach and was cardioverted.  King airway placed prior to arrival, exchanged for endotracheal tube during code.  RN able to obtain peripheral access.  Placed arterial access as well as central venous access and right femoral groin.  Site selected due to urgency.  Ultimately was placed on Levophed and epinephrine drips.  During resuscitation/codes, cardiology fellow and interventional cardiologist came to bedside.  Performed bedside echo together.  EMS EKG was concerning for possible STEMI, post ROSC EKG did not have same ST segment changes.  Cardiology elected not to take patient to Cath Lab.  Remainder of initial work-up was concerning for metabolic acidosis, hyperglycemia, acute renal failure, Covid positive.  I attempted to contact family, discussed poor prognosis and poor clinical status with patient's brother who is listed as his emergency contact.  Reports he would discuss with family.  Encouraged to come to hospital.  Critical care was consulted and came to bedside and assumed care.  Please refer to their note for final plan - critical care was in discussion with family, likely will transition to comfort care.  Should patient code again, believe any additional attempts at coding would be futile.       Final Clinical  Impression(s) / ED Diagnoses Final diagnoses:  Cardiac arrest ([facility]19  Renal failure, unspecified chronicity    Rx / DC Orders ED Discharge Orders    None       DykLucrezia StarchD 03/April 18, [id number]

## 2019-04-08 NOTE — H&P (Signed)
NAME:  Timothy Madden, MRN:  [id number], DOB:  Apr 08, 1960, LOS: 0 ADMISSION DATE:  03/13/2019, CONSULTATION DATE:  03/21/2019 REFERRING MD:  Roslynn Amble  CHIEF COMPLAINT:  Cardiac Arrest   Brief History   Timothy Madden is a 59 y.o. male who was admitted after cardiac arrest x 4 of unclear etiology with roughly 30 minutes total downtime (had arrest en route to ED then 3 more after arrival to ED).  History of present illness   Pt is encephelopathic; therefore, this HPI is obtained from chart review. Timothy Madden is a 59 y.o. male who has multiple medical problems as outlined below.  He presented to [facility] ED 3/29 after cardiac arrest.  EMS was called for what appears to be respiratory distress.  He was supposedly initially walking and talking but required BVM.  On the way to the ED, he had PEA arrest with 15 minutes before ROSC.  After arrival to [facility], he had recurrent PEA arrest x 3 with at least another 15 minutes before ROSC.  Since ROSC, he has required 77mg norepi + 262m epi.  Initial EKG showed STE in inferior and anterior leads but repeat did not and only showed different QRS.  Bedside echo performed and showed normal EF with dilated RV.  He has hx of RLE DVT and was prescribed eliquis and per notes, he has a hx of non-compliance.  With this in mind, PE was entertained; however, unlikely given his response to epi without any lytics / anticoagulation.  Furthermore, we are unable to anticoagulate him due to his ongoing hemorrhage from likely LUL.  Upon intubation, he was noted to have bright red blood from ET tube.  This continued with total of roughly 400-50029mn suction canister.  COVID returned as positive.  Due to his multiple metabolic derangements, prolonged arrest, pulm hemorrhage, PMH, he is not a candidate for TTM.  Dr. ScaGilford Raids able to reach pt's brother who agreed to DNR given extremely poor prognosis.  Mother is on the way to the hospital at this time for further  discussions.  Past Medical History  has Type 2 diabetes mellitus with stage 3 chronic kidney disease (HCCCowetaANXIETY; PSYCHIATRIC DISORDER; TOBACCO ABUSE; Osteomyelitis of finger of left hand (HCCPhippsburgOsteomyelitis (HCCAlexandriaSepsis (HCCVanceboroAKI (acute kidney injury) (HCCMartinHyponatremia; Type I diabetes mellitus with complication, uncontrolled (HCCBaltimoreEssential hypertension; Anemia of chronic disease; DKA, type 2 (HCCValdostaAcute renal failure superimposed on chronic kidney disease (HCCWest ChathamElevated lipase; Leg swelling; Flat affect; Acute stress disorder; DKA, type 1, not at goal (HCOrlando Center For Outpatient Surgery LPProtein-calorie malnutrition, severe; Acute lower UTI; Bacteremia due to Gram-negative bacteria; Bilateral hydronephrosis; DVT (deep venous thrombosis) (HCCDrum PointMetabolic acidosis; Anemia due to chronic kidney disease; Pressure injury of skin; Hyperkalemia; Hyperglycemia; and Pyelonephritis on their problem list.  Significant Hospital Events   3/29 > Admit.  Consults:  Cardiology.  Procedures:  ETT 3/29 >  R femoral CVL 3/29 >  R femoral art line 3/29 >   Significant Diagnostic Tests:  CXR 3/29 > Left sided opacities, likely hemorrhage. CT head 3/29 >   Micro Data:  COVID 3/29 > positive. Flu 3/29 > neg.  Antimicrobials:  None.   Interim history/subjective:  Non-responsive. On high dose pressors.  Objective:  Blood pressure 97/72, pulse 81, temperature (!) 95.1 F (35.1 C), temperature source Temporal, resp. rate 18, height 6' (1.829 m), weight 50 kg, SpO2 100 %.    Vent Mode: PRVC FiO2 (%):  [100 %] 100 % Set Rate:  [  26 bmp] 26 bmp Vt Set:  [500 mL-620 mL] 620 mL PEEP:  [5 cmH20] 5 cmH20 Plateau Pressure:  [19 cmH20] 19 cmH20   Intake/Output Summary (Last 24 hours) at 04/05/2019 2331 Last data filed at 03/26/2019 2311 Gross per 24 hour  Intake --  Output 300 ml  Net -300 ml   Filed Weights   03/19/2019 2300  Weight: 50 kg    Examination: General: Adult male, chronically ill appearing, critically  ill. Neuro: Non-responsive.  Does withdraw to pain. HEENT: [place]/AT. Sclerae anicteric.  ETT in place with bright red blood in filter and in suction tubing. Cardiovascular: RRR, no M/R/G.  Lungs: Respirations even and unlabored.  Coarse L > R. Abdomen: BS x 4, soft, NT/ND.  Musculoskeletal: No edema.  Skin: Intact, warm, no rashes.  Assessment & Plan:   Cardiac arrest with cardiogenic shock - unclear etiology at this point.  Not a candidate for TTM given multiple metabolic derangements, prolonged arrest, pulm hemorrhage, extensive PMH. Acute hypoxic respiratory failure - s/p intubation. Probable LUL hemorrhage - unclear etiology, ? 2/2 prolonged CPR. COVID PNA. AoCKD. DKA. Hx DM. Hx RLE DVT (on apixaban though unclear of compliance).  Extensive discussions with Dr. Gilford Raid and family (brother, mother, aunt) regarding the current circumstances, poor prognosis, and likely poor quality of life for Mr. Lukasik.  They discussed the patient's prior wishes under circumstances such as this.  The family has decided to offer full comfort care for him. They are aware that he will likely expire shortly after vasopressors and mechanical ventilation are discontinued.      Labs   CBC: Recent Labs  Lab 03/25/2019 2220  HGB 16.0  HCT 14.4   Basic Metabolic Panel: Recent Labs  Lab 03/20/2019 2220  NA 130*  K 4.9  CL 104  GLUCOSE 613*  BUN 110*  CREATININE 7.70*   GFR: Estimated Creatinine Clearance: 7.4 mL/min (A) (by C-G formula based on SCr of 7.7 mg/dL (H)). No results for input(s): PROCALCITON, WBC, LATICACIDVEN in the last 168 hours. Liver Function Tests: No results for input(s): AST, ALT, ALKPHOS, BILITOT, PROT, ALBUMIN in the last 168 hours. No results for input(s): LIPASE, AMYLASE in the last 168 hours. No results for input(s): AMMONIA in the last 168 hours. ABG    Component Value Date/Time   PHART 7.315 (L) 12/10/2018 0329   PCO2ART 29.3 (L) 12/10/2018 0329   PO2ART 74.0  (L) 12/10/2018 0329   HCO3 15.0 (L) 12/10/2018 0329   TCO2 12 (L) 04/02/2019 2220   ACIDBASEDEF 10.0 (H) 12/10/2018 0329   O2SAT 94.0 12/10/2018 0329    Coagulation Profile: No results for input(s): INR, PROTIME in the last 168 hours. Cardiac Enzymes: No results for input(s): CKTOTAL, CKMB, CKMBINDEX, TROPONINI in the last 168 hours. HbA1C: Hgb A1c MFr Bld  Date/Time Value Ref Range Status  11/10/2018 11:57 AM 14.3 (H) 4.8 - 5.6 % Final    Comment:    (NOTE) Pre diabetes:          5.7%-6.4% Diabetes:              >6.4% Glycemic control for   <7.0% adults with diabetes   06/29/2018 04:50 AM 14.6 (H) 4.8 - 5.6 % Final    Comment:    (NOTE)         Prediabetes: 5.7 - 6.4         Diabetes: >6.4         Glycemic control for adults with diabetes: <7.0  CBG: Recent Labs  Lab 04/06/2019 1025  ENIDPO 242*    Review of Systems:   Unable to obtain as pt is encephalopathic.  Past medical history  He,  has a past medical history of Amputation of finger, left, Complication of anesthesia, Diabetes mellitus without complication ([place]), DKA (diabetic ketoacidoses) ([place]) (06/2018), and PONV (postoperative nausea and vomiting).   Surgical History    Past Surgical History:  Procedure Laterality Date  . I & D EXTREMITY Left 10/26/2013   Procedure: IRRIGATION AND DEBRIDEMENT EXTREMITY,PARTIAL AMPUTATION LEFT INDEX FINGER;  Surgeon: Dayna Barker, MD;  Location: [facility];  Service: Plastics;  Laterality: Left;  . I & D EXTREMITY Left 10/30/2013   Procedure: IRRIGATION AND DEBRIDEMENT AND REVISION AMPUTATION OF LEFT INDEX FINGER;  Surgeon: Dayna Barker, MD;  Location: [facility];  Service: Plastics;  Laterality: Left;     Social History   reports that he has been smoking cigarettes. He has a 20.00 pack-year smoking history. He has never used smokeless tobacco. He reports that he does not drink alcohol or use drugs.   Family history   His family history includes Diabetes in his brother,  father, and sister; Heart disease in his brother; Hypertension in his mother.   Allergies No Known Allergies   Home meds  Prior to Admission medications   Medication Sig Start Date End Date Taking? Authorizing Provider  apixaban (ELIQUIS) 5 MG TABS tablet Take 1 tablet (5 mg total) by mouth 2 (two) times daily. 01/18/19 03/19/19  Cherylann Ratel A, DO  blood glucose meter kit and supplies KIT Dispense based on patient and insurance preference. Use up to four times daily as directed. (FOR ICD-9 250.00, 250.01). 07/02/18   Aline August, MD  Blood Glucose Monitoring Suppl DEVI Glucometer. Also testing strips and lancets quantity sufficient for BID testing for 90 days. 01/19/19   Cherylann Ratel A, DO  insulin aspart (NOVOLOG) 100 UNIT/ML injection Inject 8 Units into the skin 3 (three) times daily with meals. 01/18/19 04/11/19  Cherylann Ratel A, DO  insulin detemir (LEVEMIR) 100 UNIT/ML injection Inject 0.1 mLs (10 Units total) into the skin at bedtime. 01/18/19   Cherylann Ratel A, DO  Insulin Syringe-Needle U-100 (INSULIN SYRINGE .5CC/31GX5/16") 31G X 5/16" 0.5 ML MISC Levemir 10 units daily and NovoLog 12 units 3 times a day with meals 01/18/19   Marylyn Ishihara, Tyrone A, DO  mirtazapine (REMERON) 7.5 MG tablet Take 1 tablet (7.5 mg total) by mouth at bedtime. 01/18/19 03/19/19  Cherylann Ratel A, DO  nitroGLYCERIN (NITROSTAT) 0.4 MG SL tablet Place 1 tablet (0.4 mg total) under the tongue every 5 (five) minutes as needed for up to 40 doses for chest pain. 01/18/19   Cherylann Ratel A, DO  pantoprazole (PROTONIX) 40 MG tablet Take 1 tablet (40 mg total) by mouth daily. 01/18/19 02/17/19  Cherylann Ratel A, DO    Critical care time: 50 min.    Montey Hora, [facility] 03/31/2019, 11:31 PM

## 2019-04-08 NOTE — ED Notes (Signed)
Chaplain called--Whitnie Deleon

## 2019-04-08 NOTE — ED Notes (Signed)
Removed foley catheter. Noted blood from meatus.

## 2019-04-08 NOTE — ED Triage Notes (Signed)
Epi drip started at 10

## 2019-04-09 DIAGNOSIS — J1282 Pneumonia due to coronavirus disease 2019: Secondary | ICD-10-CM | POA: Insufficient documentation

## 2019-04-09 DIAGNOSIS — R0489 Hemorrhage from other sites in respiratory passages: Secondary | ICD-10-CM | POA: Insufficient documentation

## 2019-04-09 DIAGNOSIS — I4510 Unspecified right bundle-branch block: Secondary | ICD-10-CM | POA: Insufficient documentation

## 2019-04-09 DIAGNOSIS — G9340 Encephalopathy, unspecified: Secondary | ICD-10-CM | POA: Insufficient documentation

## 2019-04-09 DIAGNOSIS — I249 Acute ischemic heart disease, unspecified: Secondary | ICD-10-CM | POA: Insufficient documentation

## 2019-04-09 LAB — COMPREHENSIVE METABOLIC PANEL
ALT: 147 U/L — ABNORMAL HIGH (ref 0–44)
AST: 196 U/L — ABNORMAL HIGH (ref 15–41)
Albumin: 3.2 g/dL — ABNORMAL LOW (ref 3.5–5.0)
Alkaline Phosphatase: 107 U/L (ref 38–126)
Anion gap: 21 — ABNORMAL HIGH (ref 5–15)
BUN: 97 mg/dL — ABNORMAL HIGH (ref 6–20)
CO2: 10 mmol/L — ABNORMAL LOW (ref 22–32)
Calcium: 8.9 mg/dL (ref 8.9–10.3)
Chloride: 98 mmol/L (ref 98–111)
Creatinine, Ser: 7.66 mg/dL — ABNORMAL HIGH (ref 0.61–1.24)
GFR calc Af Amer: 8 mL/min — ABNORMAL LOW (ref >60)
GFR calc non Af Amer: 7 mL/min — ABNORMAL LOW (ref >60)
Glucose, Bld: 662 mg/dL (ref 70–99)
Potassium: 4.9 mmol/L (ref 3.5–5.1)
Sodium: 129 mmol/L — ABNORMAL LOW (ref 135–145)
Total Bilirubin: 0.8 mg/dL (ref 0.3–1.2)
Total Protein: 8 g/dL (ref 6.5–8.1)

## 2019-04-09 LAB — MAGNESIUM: Magnesium: 3 mg/dL — ABNORMAL HIGH (ref 1.7–2.4)

## 2019-04-09 LAB — PHOSPHORUS: Phosphorus: 7.6 mg/dL — ABNORMAL HIGH (ref 2.5–4.6)

## 2019-04-09 LAB — TROPONIN I (HIGH SENSITIVITY): Troponin I (High Sensitivity): 67 ng/L — ABNORMAL HIGH (ref <18)

## 2019-04-09 MED ORDER — FENTANYL CITRATE (PF) 100 MCG/2ML IJ SOLN
INTRAMUSCULAR | Status: AC
  Filled 2019-04-09: qty 2

## 2019-04-09 MED ORDER — ACETAMINOPHEN 325 MG PO TABS: 650.0000 mg | ORAL_TABLET | Freq: Four times a day (QID) | ORAL | Status: DC | PRN

## 2019-04-09 MED ORDER — GLYCOPYRROLATE 0.2 MG/ML IJ SOLN: 0.2000 mg | INTRAMUSCULAR | Status: DC | PRN

## 2019-04-09 MED ORDER — POLYVINYL ALCOHOL 1.4 % OP SOLN
1.0000 [drp] | Freq: Four times a day (QID) | OPHTHALMIC | Status: DC | PRN
  Filled 2019-04-09: qty 15

## 2019-04-09 MED ORDER — MORPHINE SULFATE (PF) 2 MG/ML IV SOLN: 2.0000 mg | INTRAVENOUS | Status: DC | PRN

## 2019-04-09 MED ORDER — DEXTROSE 5 % IV SOLN: INTRAVENOUS | Status: DC

## 2019-04-09 MED ORDER — ACETAMINOPHEN 650 MG RE SUPP: 650.0000 mg | Freq: Four times a day (QID) | RECTAL | Status: DC | PRN

## 2019-04-09 MED ORDER — GLYCOPYRROLATE 1 MG PO TABS
1.0000 mg | ORAL_TABLET | ORAL | Status: DC | PRN
  Filled 2019-04-09: qty 1

## 2019-04-09 MED ORDER — MORPHINE 100MG IN NS 100ML (1MG/ML) PREMIX INFUSION
0.0000 mg/h | INTRAVENOUS | Status: DC
  Administered 2019-04-09: 5 mg/h via INTRAVENOUS
  Filled 2019-04-09: qty 100

## 2019-04-09 MED ORDER — DIPHENHYDRAMINE HCL 50 MG/ML IJ SOLN: 25.0000 mg | INTRAMUSCULAR | Status: DC | PRN

## 2019-04-09 MED ORDER — MORPHINE BOLUS VIA INFUSION
5.0000 mg | INTRAVENOUS | Status: DC | PRN
  Filled 2019-04-09: qty 5

## 2019-04-11 MED FILL — Medication: Qty: 1 | Status: AC

## 2019-04-11 NOTE — ED Notes (Signed)
On assessment no colostomy bag noted. Foley catheter in place and in poor condition. Rupture bag. Catheter 2/3 of the way removed. Not secured

## 2019-04-11 NOTE — ED Notes (Signed)
Death Certificate completed by Dr. Gilford Raid. Certificate Sent with pt to morgue.

## 2019-04-11 NOTE — Progress Notes (Addendum)
Chaplain returned to provide spiritual comfort and support as family received news that further care would be futile.  Family agreed and responded to situation with a sense of resignation and understanding.  Mother, sister and niece were present.  Niece was tearful as she recounted that pt was like a father to her.  Mother stated pt always refused her wishes to take him to a doctor.   Chaplain provided patient placement card.  Family does not yet have a funeral home, but were advised to call tomorrow with their intentions.  NOK:    Adin Hector (mother) [place], [place] [phone]  Chaplain appreciated RN Bailey's advocacy on behalf of family, which this chaplain has witness on other occasions and MD Scatliff's conversation regarding pt and encouragement of continued Covid precautions by exposed family members.   Chaplain returned various times to bring drinks and crackers for the family.  Family requested info on time of death, when it occurs.  RN Mel Almond called the family with an update after they decided to go home (pt's mother wasn't feeling well).  Chaplain will communicate TOD to family.    Minus Liberty, [place]

## 2019-04-11 NOTE — ED Notes (Signed)
Discussed plan of care with family (Mother Darrick Grinder, Niece, and sister). Informed family pt has been extubated, epi and norepi discontinued, and morphine drip started for comfort.   Per CC MD and charge RN pt able to be at bedside. Family made aware pt is COVID positive. Family aware of risk of exposure. Family to discussed if they would like to be present. RN to update if pt passes.  Chaplin with family.

## 2019-04-11 NOTE — ED Notes (Signed)
Pt. Asystole at this time. Peripheral pulse absent. Lead I and lead II on zoll printed showing asystole. D5 and morphine discontinued   Time of death 1:53.   Second RN [employer] RN verified.   CC MD paged. RN spoke to Dr. Madalyn Rob informed pt had passed. Dr. Madalyn Rob to reach out to Dr. Gilford Raid in regards to documentation of death certificate.   Mable Fill has informed family.

## 2019-04-11 NOTE — ED Notes (Signed)
Wasted 95 mL morphine with Callie RN.   Medication emptied in sink

## 2019-04-11 NOTE — Procedures (Signed)
Extubation Procedure Note  Patient Details:   Name: REGINO FOURNET DOB: [birth year] MRN: [id number]   Airway Documentation:    Vent end date: 05-02-2019 Vent end time: 0020   Evaluation  O2 sats: currently acceptable Complications: No apparent complications Patient did tolerate procedure well.     No   Patient extubate per order.   [place] 05/02/19, 1:39 AM

## 2019-04-11 NOTE — Discharge Summary (Signed)
DISCHARGE SUMMARY AND DEATH NOTE   Patient Details  Name: Timothy Madden MRN: [id number] DOB: 09-24-1960  Admission/Discharge Information   Admit Date:  May 03, 2019  Date of Death: Date of Death: 05/04/2019  Time of Death: Time of Death: 0153  Length of Stay: 1  Referring Physician: Marliss Coots, NP   Reason(s) for Hospitalization  s/p cardiac arrest  Diagnoses  Preliminary cause of death:  Secondary Diagnoses (including complications and co-morbidities):  Active Problems:   Diabetic acidosis (HCC)   Acute kidney injury superimposed on chronic kidney disease ([place])   Cardiac arrest [facility])   Brief Hospital Course (including significant findings, care, treatment, and services provided and events leading to death)  Timothy Madden is a 59 y.o. year old male who presented on 05-03-22 with a PMHx significant for DM, CKD (Baseline Cr 4), h/o DVT on Eliquis (?Compliance), presents s/p cardiac arrest April 27, 2138 w/ EMS PEA (multiple events requiring epi pushes and defib x 2 for Vtach). Total downtime >30 mins. Post ROSC started on Epi gtt at 20 Levo gtt at 48 and a right femoral CVC was placed.  Bedside echo performed by EDP (only able to get subxiphoid views-> RV dilated but no McConnell's) pt never required lytics for ROSC. Pt did have LUL opacity on CXR with hemorrhage in the ETT (>400 cc in cannister) dyssynchronous on PRVC--> more synchronous with PC mode.  Sig Labs: BG >600 Bicarb 13.7 on ABG 2, Cr 7.7. ABG: 7.03/49/219/13 Admitted to ICU for management of  1. Cardiac Arrest secondary to Acute Coronary syndrome. Acute changes on EKG show a new RBBB. STEMI was activated but pt was not taken for Cath by Cardiology (see their note).  2. Acute hypoxemic respiratory failure s/p endotracheal intubation:  CXR shows diffuse infiltrate with increased opacity in LUL and active pulmonary hemorrhage. + COVID  on full precautions. Given high dose steroid.  3. Cardiogenic Shock on Epi and Levophed 4. Acute  renal Failure-> Cr 7.70 decreased UOP  5. Anion Gap Metabolic acidosis from most likely elevated lactate vs DKA vs Uremia.  Discussed case with EDP Dr Roslynn Amble initially we were unable to reach [facility].  After several attempts spoke to Brother and pt's code status was changed to DNR.  Pt's mother, aunt and niece came shortly after and I explained critical condition of patient and what interventions have been performed so far. We discussed his prognosis and that given his acute hemorrhage he was not a candidate for TTM, or anticoagulation. That given his Acute renal failure he is not a candidate for cardiac catheterization. That without anticoagulation or cardiac catheterization his prognosis was very poor in the setting of ACS. That given his COVID + status this further worsened his prognosis with having both respiratory and renal failure.  The family agreed to make him comfortable at this time given his grave prognosis and poor neuro exam Goal of Care: Comfort Care Code Status: DNR  Pertinent Labs and Studies  Significant Diagnostic Studies DG Chest Portable 1 View  Result Date: May 03, 2019 CLINICAL DATA:  Recent STEMI, post CPR with tube placement EXAM: PORTABLE CHEST 1 VIEW COMPARISON:  12/18/2018 FINDINGS: Cardiac shadows within normal limits. Gastric catheter is noted within the stomach. Endotracheal tube is noted in satisfactory position 5.3 cm above the carina. The lungs are well aerated bilaterally. Diffuse patchy infiltrate is noted in the mid and upper lung on the left and to a lesser degree in the right perihilar region. IMPRESSION: New patchy opacities in both  lungs left greater than right. Endotracheal tube and gastric catheter are noted in satisfactory position. Electronically Signed   By: Inez Catalina M.D.   On: 03/17/2019 22:52    Microbiology Recent Results (from the past 240 hour(s))  Respiratory Panel by RT PCR (Flu A&B, Covid) - Nasopharyngeal Swab     Status: Abnormal   Collection  Time: 04/03/2019 10:22 PM   Specimen: Nasopharyngeal Swab  Result Value Ref Range Status   SARS Coronavirus 2 by RT PCR POSITIVE (A) NEGATIVE Final    Comment: RESULT CALLED TO, READ BACK BY AND VERIFIED WITH: Felton Clinton RN 03/21/2019 2328 JDW (NOTE) SARS-CoV-2 target nucleic acids are DETECTED. SARS-CoV-2 RNA is generally detectable in upper respiratory specimens  during the acute phase of infection. Positive results are indicative of the presence of the identified virus, but do not rule out bacterial infection or co-infection with other pathogens not detected by the test. Clinical correlation with patient history and other diagnostic information is necessary to determine patient infection status. The expected result is Negative. Fact Sheet for Patients:  PinkCheek.be Fact Sheet for Healthcare Providers: GravelBags.it This test is not yet approved or cleared by the [place] FDA and  has been authorized for detection and/or diagnosis of SARS-CoV-2 by FDA under an Emergency Use Authorization (EUA).  This EUA will remain in effect (meaning this test can be used) for the  duration of  the COVID-19 declaration under Section 564(b)(1) of the Act, 21 U.S.C. section 360bbb-3(b)(1), unless the authorization is terminated or revoked sooner.    Influenza A by PCR NEGATIVE NEGATIVE Final   Influenza B by PCR NEGATIVE NEGATIVE Final    Comment: (NOTE) The Xpert Xpress SARS-CoV-2/FLU/RSV assay is intended as an aid in  the diagnosis of influenza from Nasopharyngeal swab specimens and  should not be used as a sole basis for treatment. Nasal washings and  aspirates are unacceptable for Xpert Xpress SARS-CoV-2/FLU/RSV  testing. Fact Sheet for Patients: PinkCheek.be Fact Sheet for Healthcare Providers: GravelBags.it This test is not yet approved or cleared by the [place] FDA and   has been authorized for detection and/or diagnosis of SARS-CoV-2 by  FDA under an Emergency Use Authorization (EUA). This EUA will remain  in effect (meaning this test can be used) for the duration of the  Covid-19 declaration under Section 564(b)(1) of the Act, 21  U.S.C. section 360bbb-3(b)(1), unless the authorization is  terminated or revoked. Performed at [facility], [place] [address] [place], [place] [address]     Lab Basic Metabolic Panel: Recent Labs  Lab 03/24/2019 2220 03/30/2019 2240 04/05/2019 2341  NA 130* 129* 136  K 4.9 4.9 4.2  CL 104 98  --   CO2  --  10*  --   GLUCOSE 613* 662*  --   BUN 110* 97*  --   CREATININE 7.70* 7.66*  --   CALCIUM  --  8.9  --   MG  --  3.0*  --   PHOS  --  7.6*  --    Liver Function Tests: Recent Labs  Lab 03/15/2019 2240  AST 196*  ALT 147*  ALKPHOS 107  BILITOT 0.8  PROT 8.0  ALBUMIN 3.2*   No results for input(s): LIPASE, AMYLASE in the last 168 hours. No results for input(s): AMMONIA in the last 168 hours. CBC: Recent Labs  Lab 03/20/2019 2220 03/17/2019 2240 04/08/19 2341  WBC  --  9.4  --   HGB 16.0 13.9 12.6*  HCT 47.0 47.2 37.0*  MCV  --  89.6  --   PLT  --  160  --    Cardiac Enzymes: No results for input(s): CKTOTAL, CKMB, CKMBINDEX, TROPONINI in the last 168 hours. Sepsis Labs: Recent Labs  Lab 04/06/2019 2240  WBC 9.4    Procedures/Operations  Right femoral CVC- ED Bedside US- EDP   Aarianna Hoadley D Gerardo Territo May 05, 2019, 3:00 AM

## 2019-04-11 NOTE — ED Notes (Addendum)
Epinephrine and norepinephrine discontinued. Morphine drip started. RT present for extubation.

## 2019-04-11 NOTE — ED Notes (Signed)
Pt transported to [facility] morgue

## 2019-04-11 DEATH — deceased

## 2020-04-18 IMAGING — DX DG CHEST 1V PORT
1 series · 1 of 1 positions shown · non-contrast
Comparison: 11/21/2007

CLINICAL DATA: [REDACTED] reports that the patient has been altered for
an unknown amount of time. Pt does not answer questions but reacts
to pain. Pt hx diabetes. Pt is a smoker

EXAM:
PORTABLE CHEST - 1 VIEW

[chest ap]
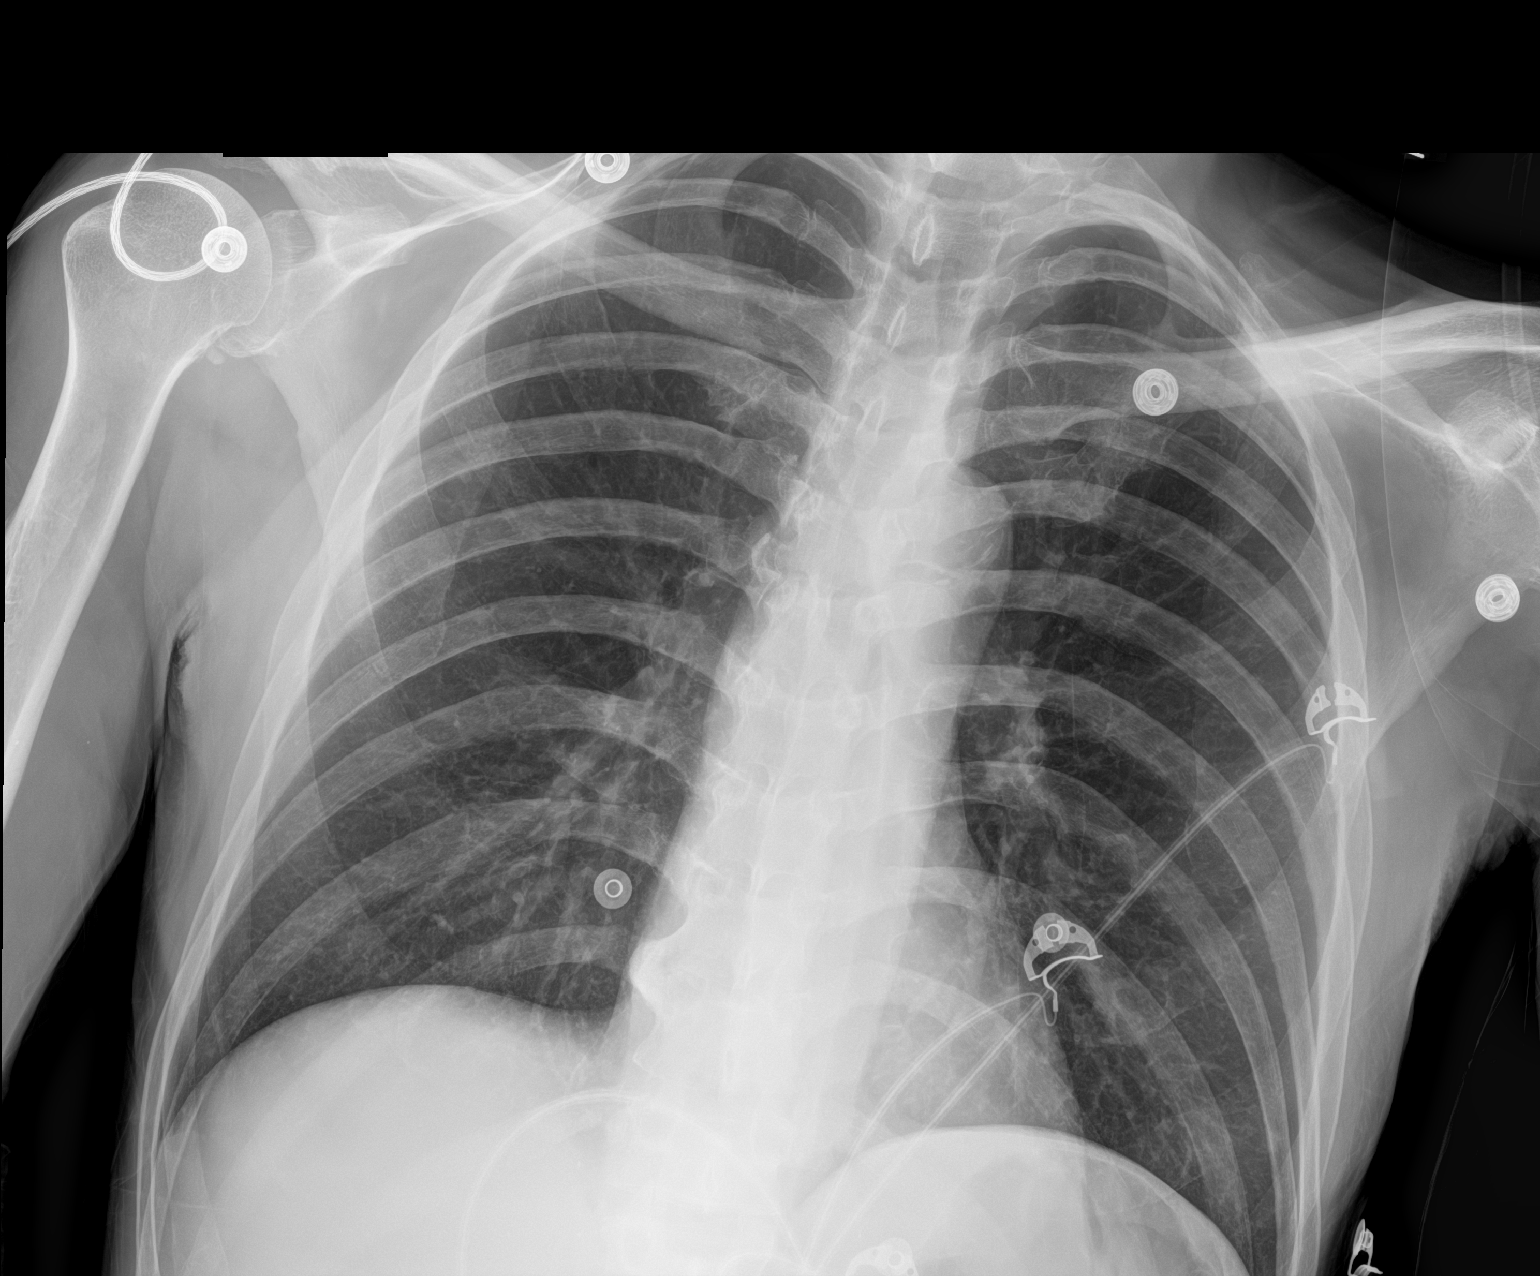

[1 of 1 positions shown; findings below may reference images not displayed]

FINDINGS: Lungs are clear.

Heart size and mediastinal contours are within normal limits.

No effusion.

Visualized bones unremarkable.
IMPRESSION: No acute cardiopulmonary disease.

## 2020-04-20 IMAGING — US US RENAL
1 series · 14 of 25 positions shown · non-contrast
Comparison: July 19, 2018

CLINICAL DATA: Acute kidney injury

EXAM:
RENAL / URINARY TRACT ULTRASOUND COMPLETE

[Series 1: us renal · 14 of 40 slices shown]
[im 1/40]
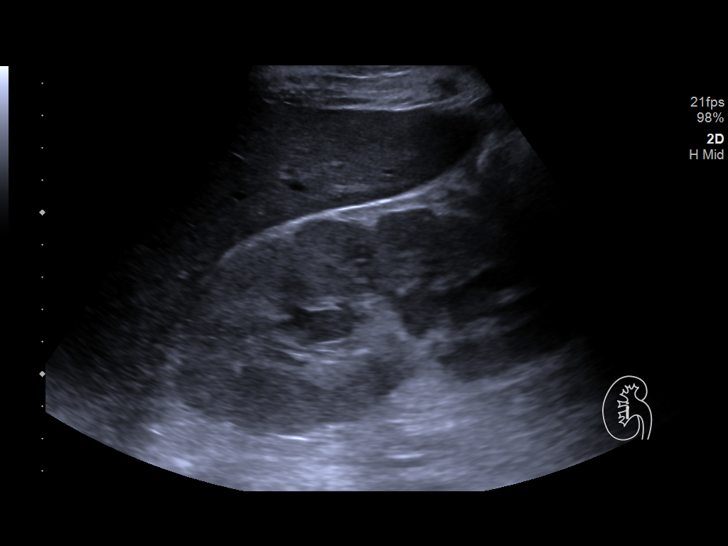
[im 4/40]
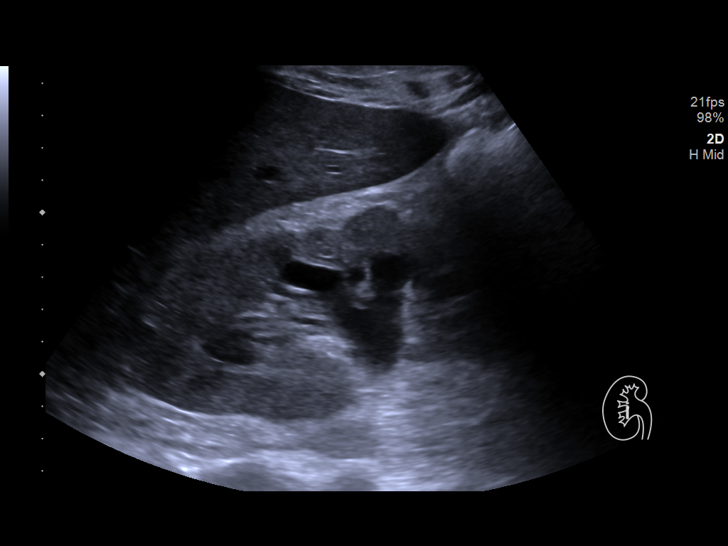
[im 7/40]
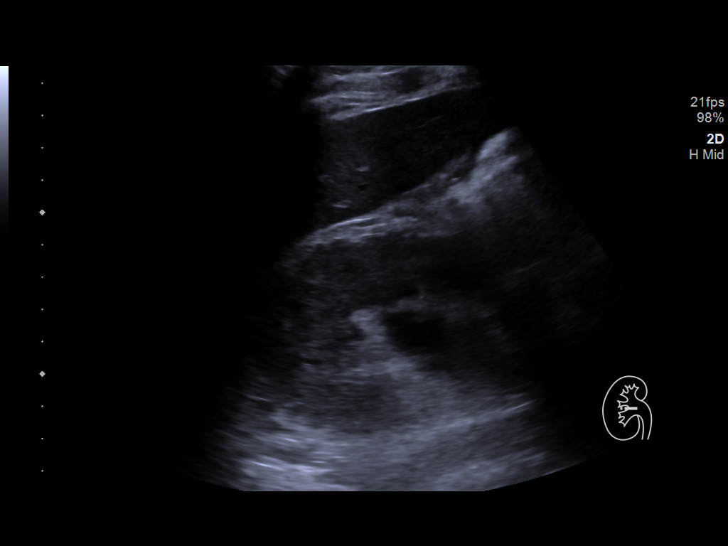
[im 10/40]
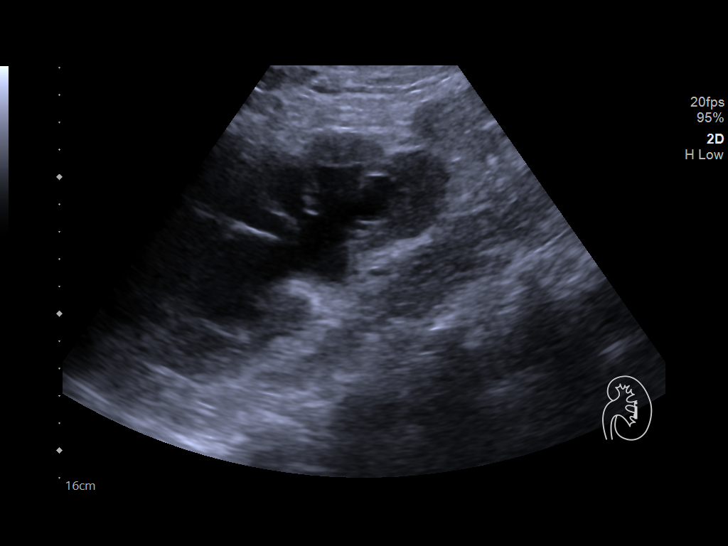
[im 14/40]
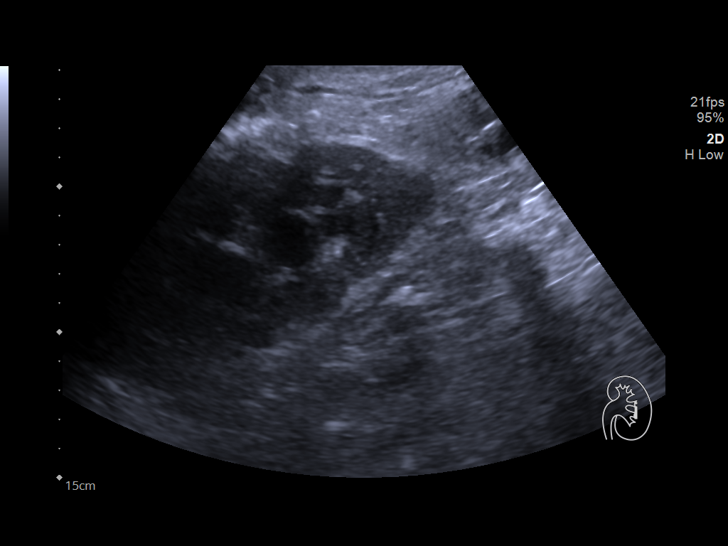
[im 15/40]
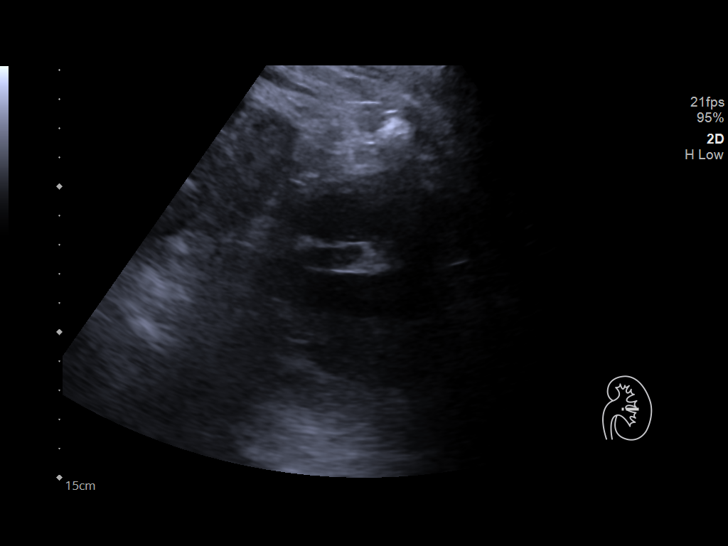
[im 18/40]
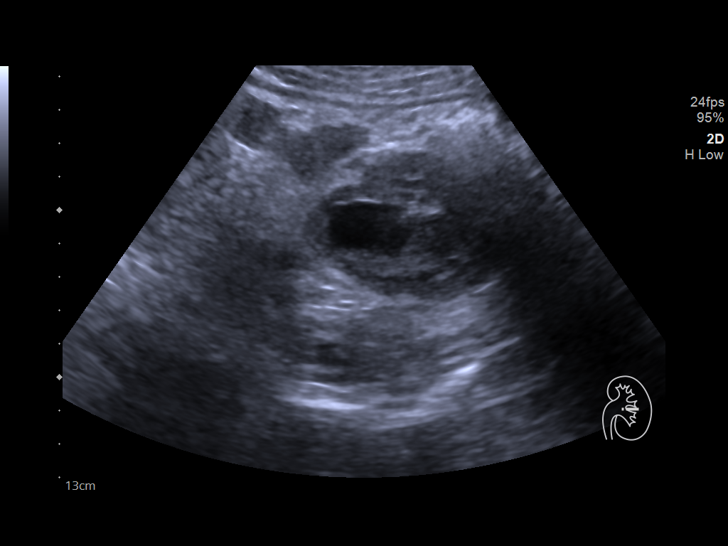
[im 22/40]
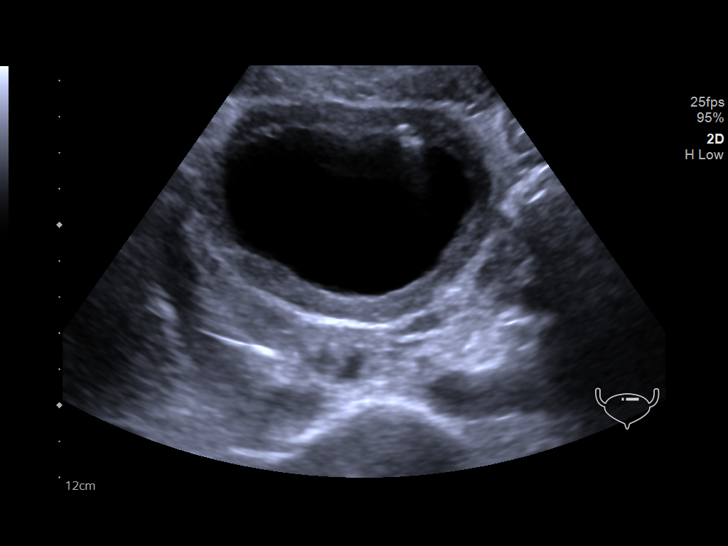
[im 25/40]
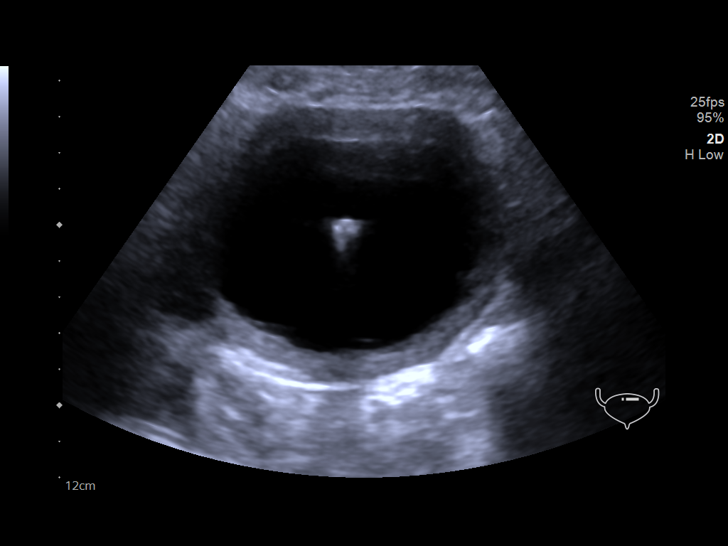
[im 27/40]
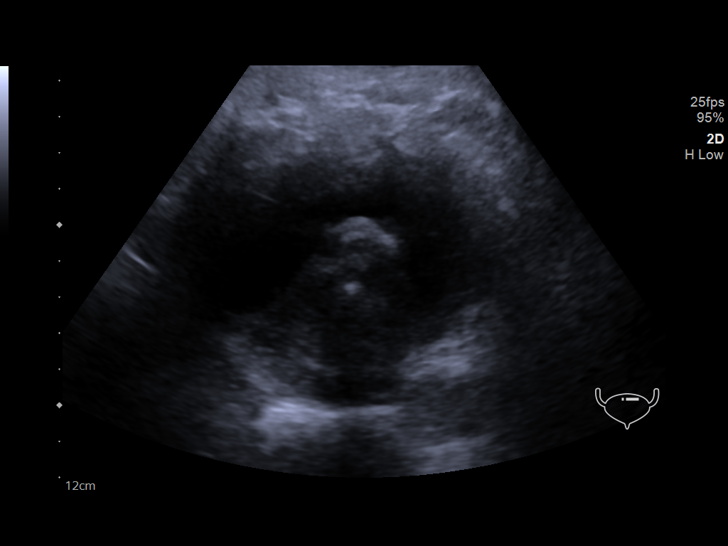
[im 30/40]
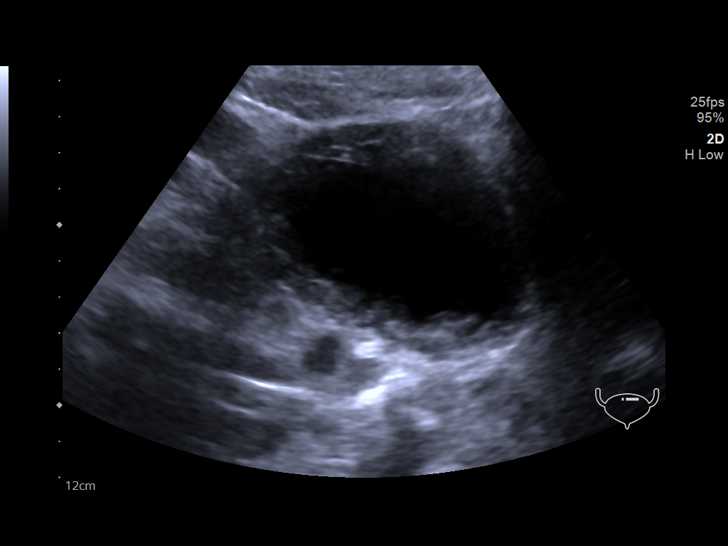
[im 33/40]
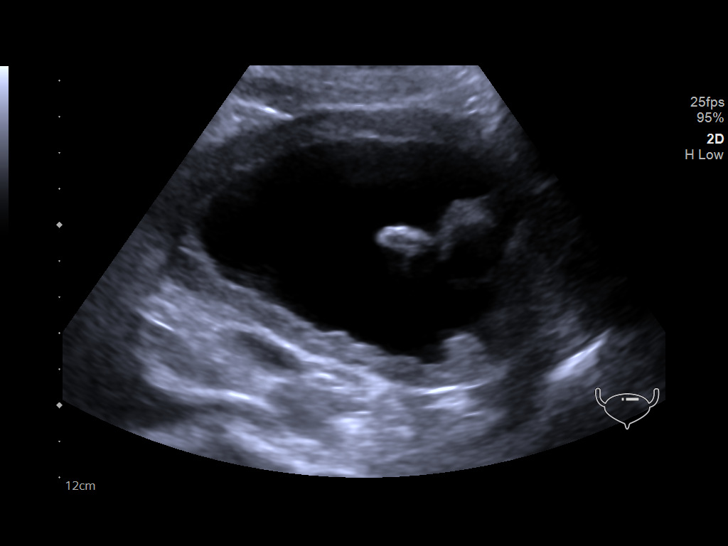
[im 36/40]
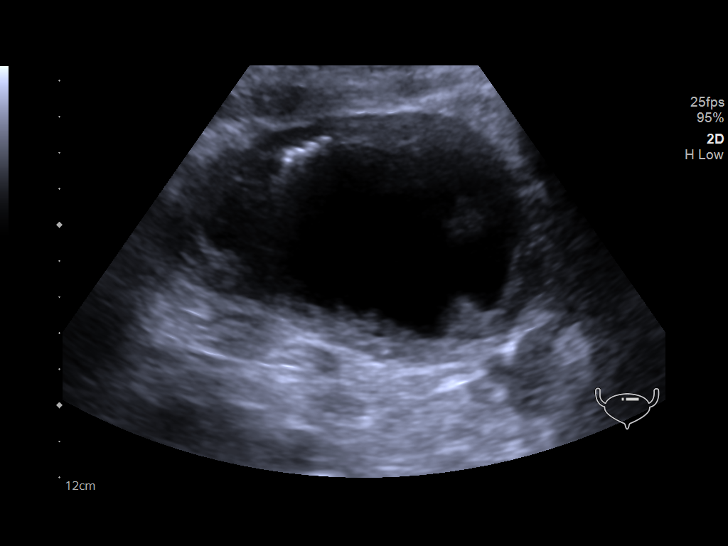
[im 40/40]
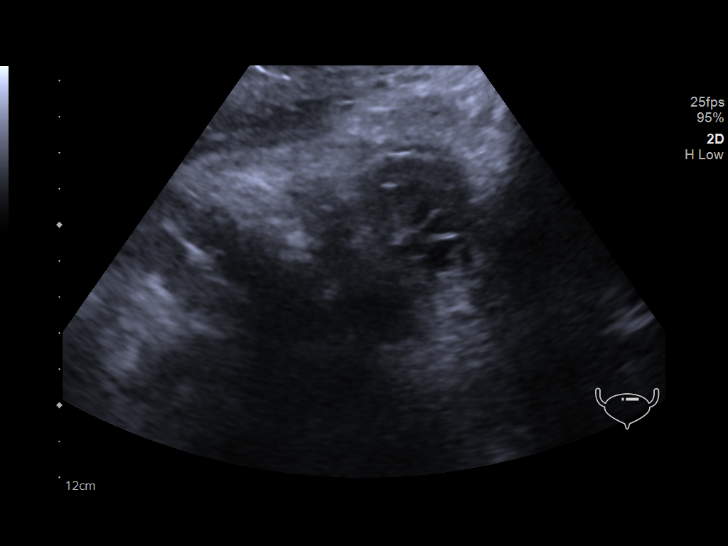

[14 of 25 positions shown; findings below may reference images not displayed]

FINDINGS: Right Kidney:

Renal measurements: 12.8 x 5.8 x 6 cm = volume: 233 mL .
Echogenicity within normal limits. No mass visualized. Mild
hydronephrosis.

Left Kidney:

Renal measurements: 13.5 x 5.9 x 6 cm. = volume: 253 mL.
Echogenicity within normal limits. No mass visualized. Moderate
hydronephrosis.

Bladder:

Bladder wall thickening seen again. Foley catheter is present.
Echogenic foci along nondependent bladder wall likely reflect air.

Other:

None.
IMPRESSION: Persistent left greater than right hydronephrosis and bladder wall
thickening.

## 2020-04-23 IMAGING — US US RENAL
1 series · 14 of 25 positions shown · non-contrast
Comparison: Renal ultrasound 12/10/2018

CLINICAL DATA: Hydronephrosis

EXAM:
RENAL / URINARY TRACT ULTRASOUND COMPLETE

[Series 1: us renal · 14 of 37 slices shown]
[im 1/37]
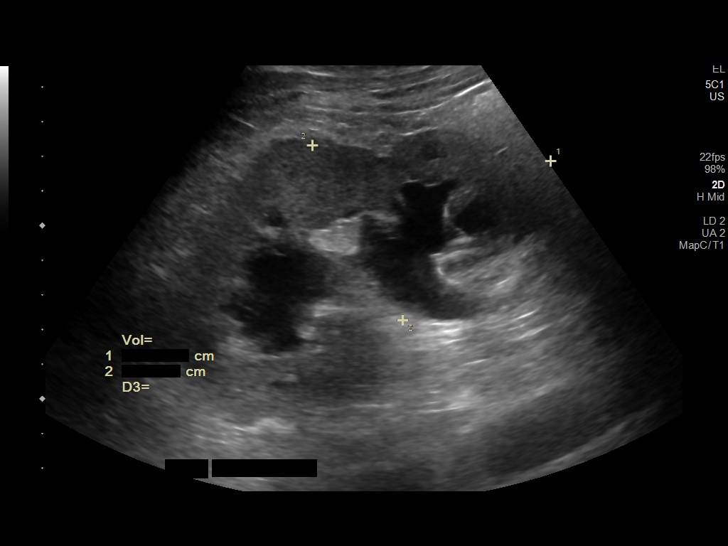
[im 4/37]
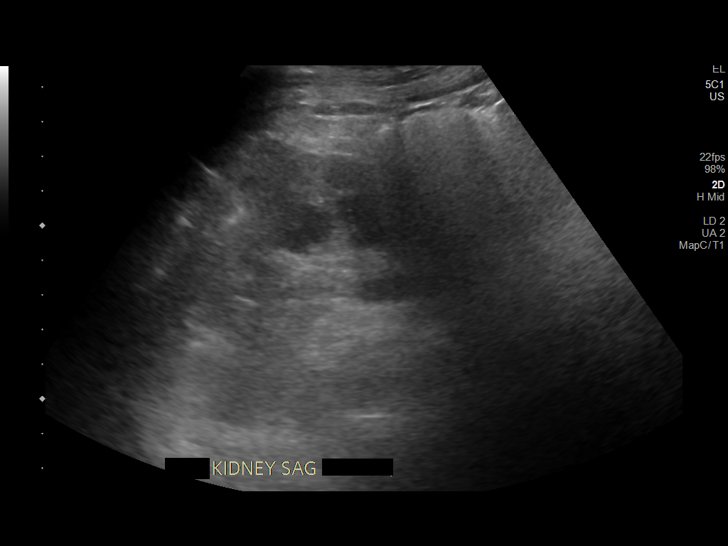
[im 7/37]
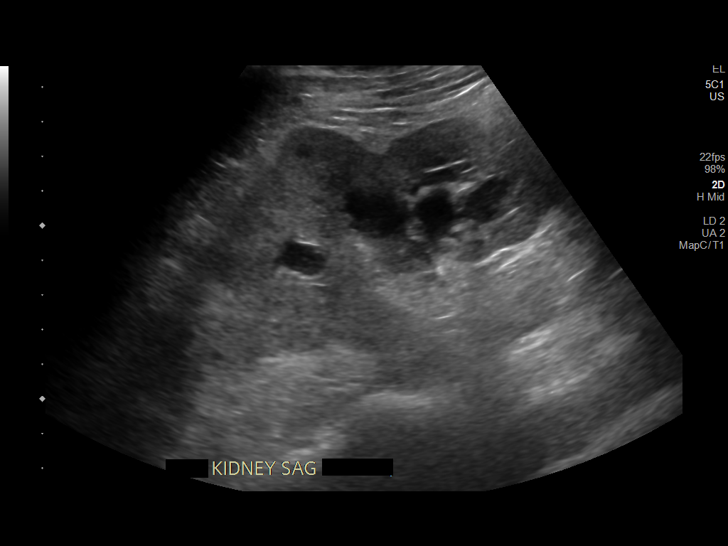
[im 10/37]
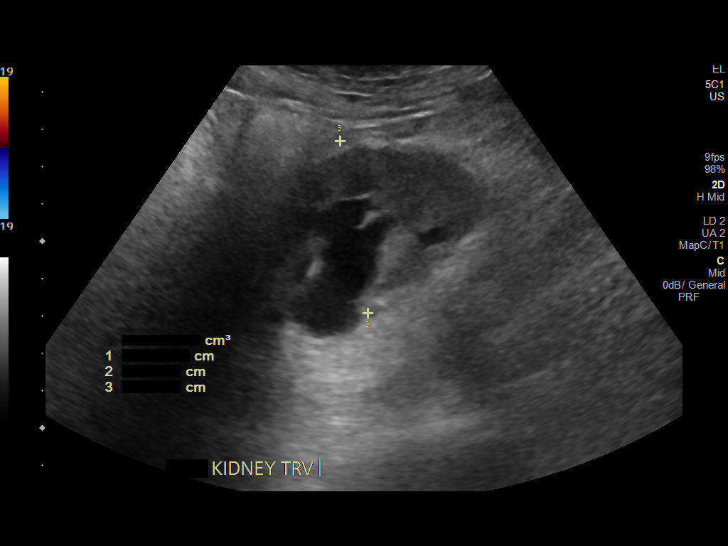
[im 13/37]
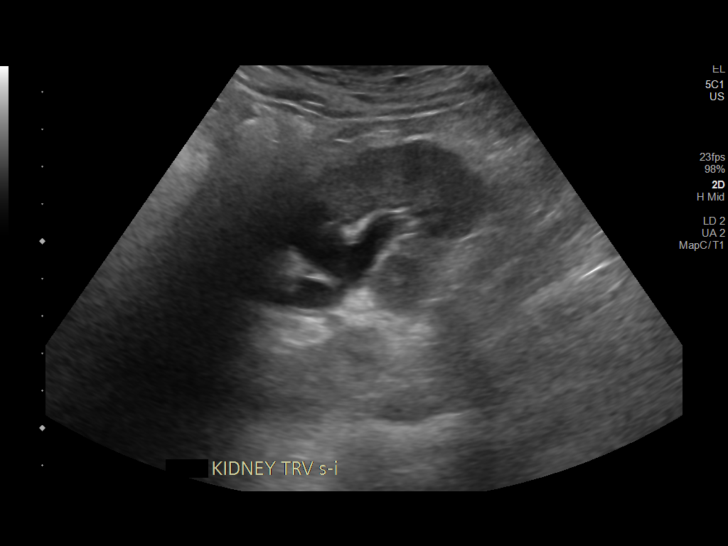
[im 14/37]
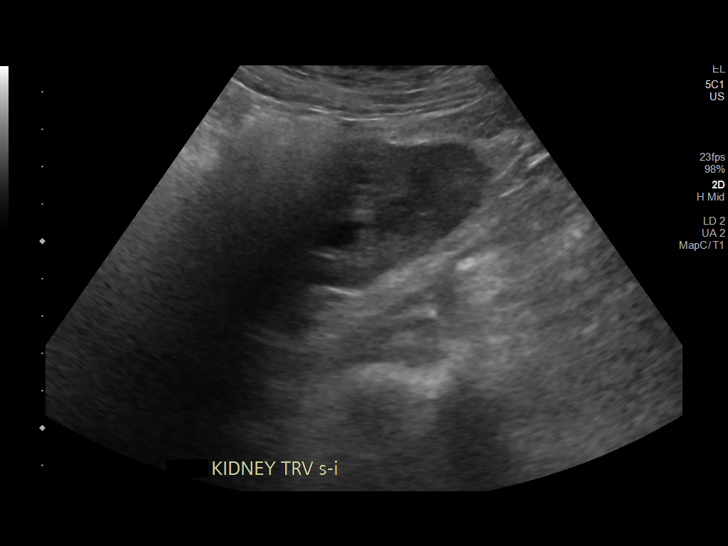
[im 17/37]
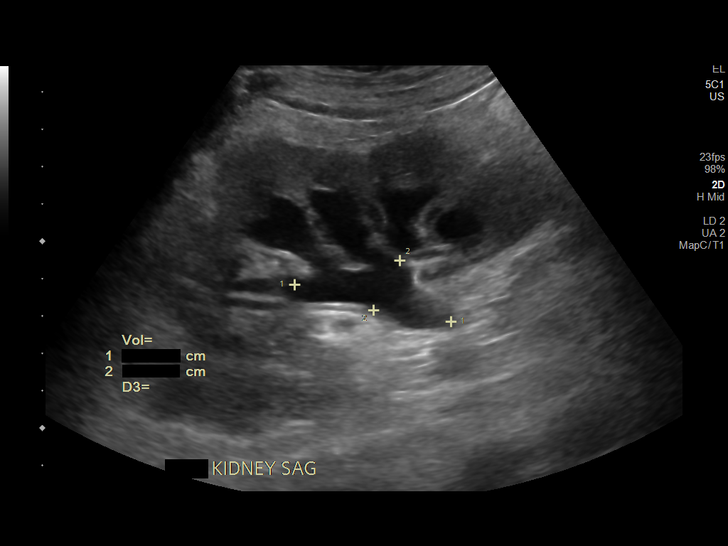
[im 20/37]
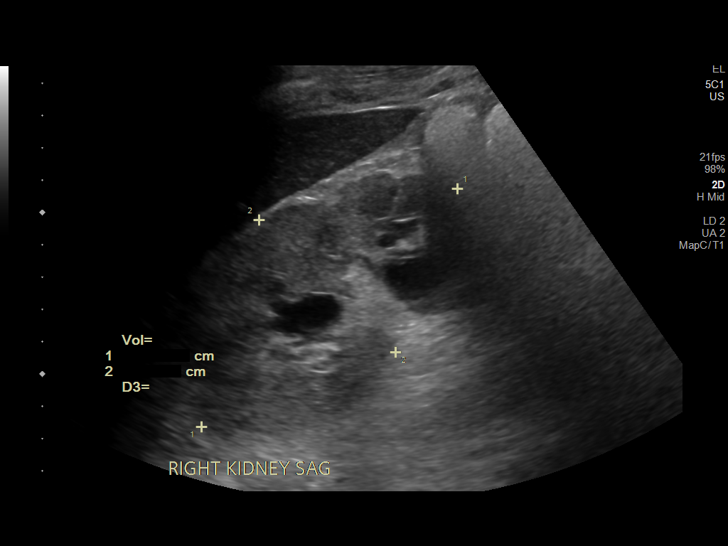
[im 23/37]
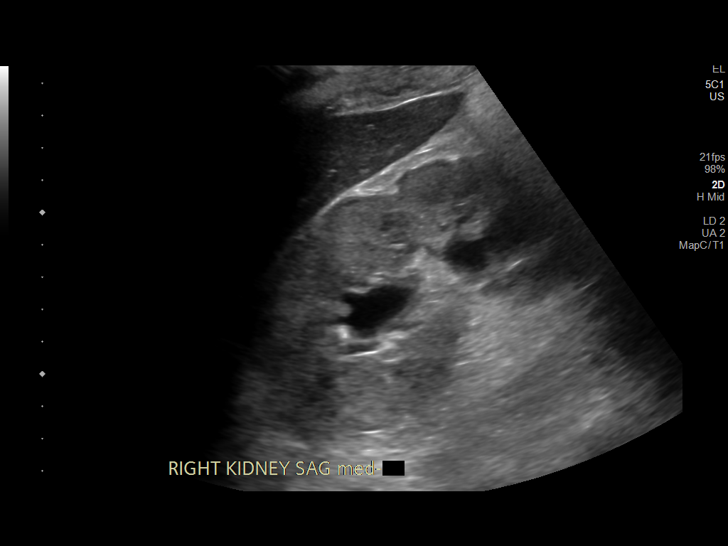
[im 25/37]
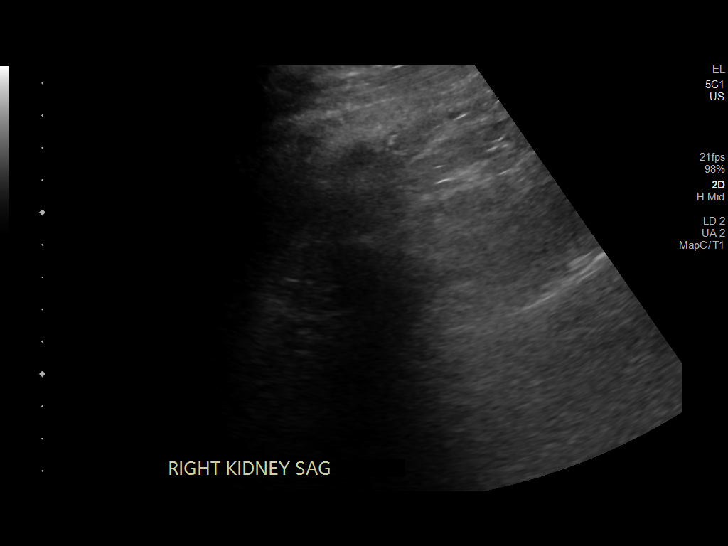
[im 28/37]
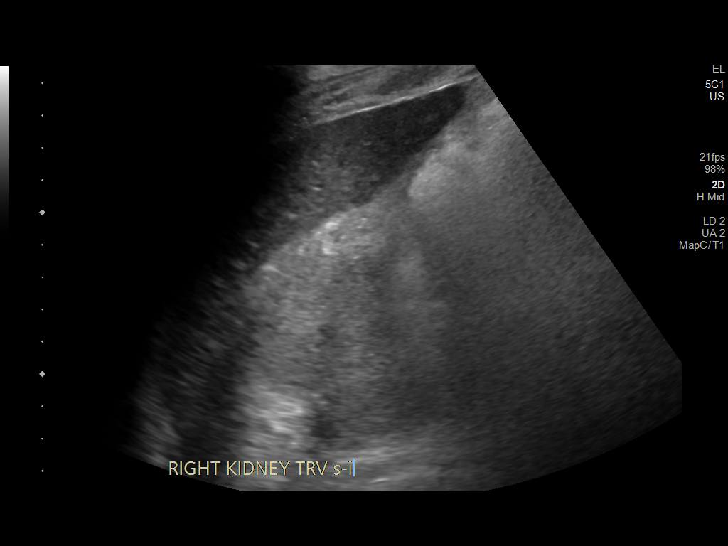
[im 31/37]
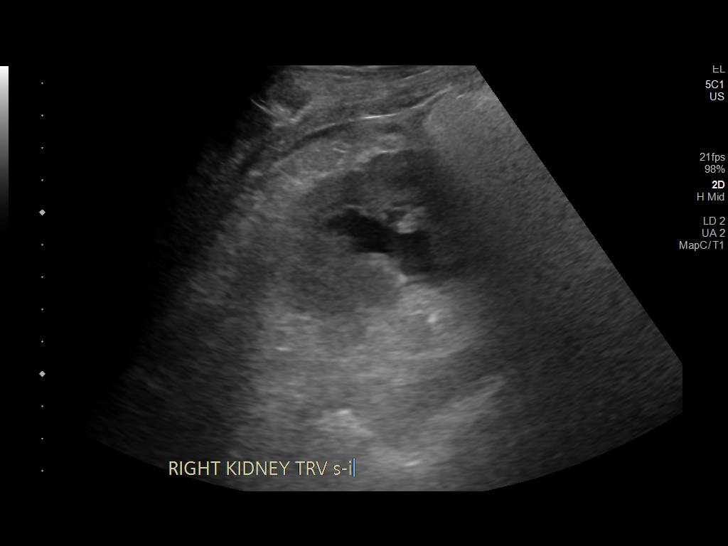
[im 34/37]
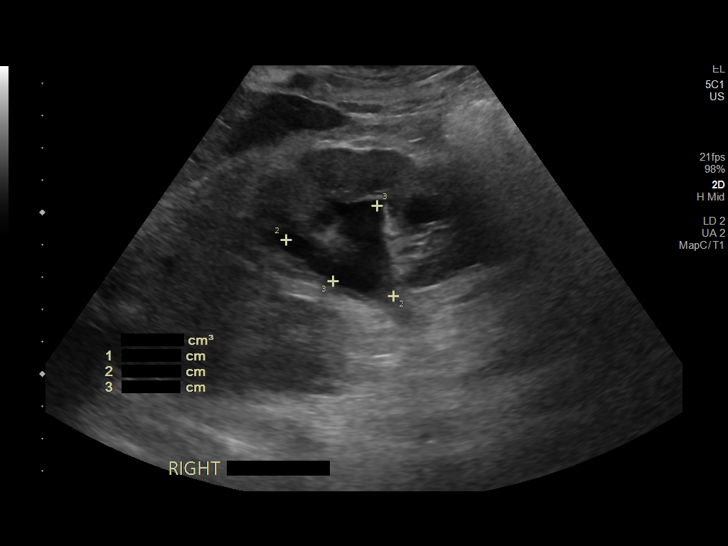
[im 37/37]
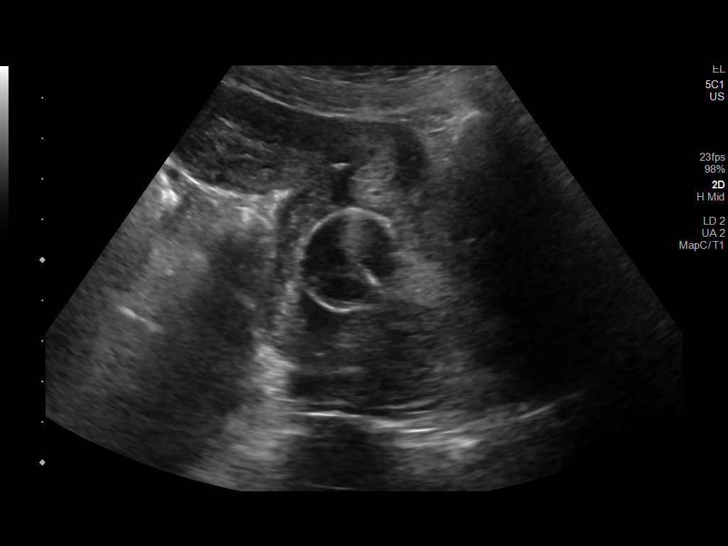

[14 of 25 positions shown; findings below may reference images not displayed]

FINDINGS: Right Kidney:

Renal measurements: 10.8 x 5.9 x 6.4 cm = volume: 214 mL. Mild to
moderate right hydronephrosis, increased since prior study. Mildly
increased echotexture.

Left Kidney:

Renal measurements: 12.1 x 5.7 x 4.7 cm = volume: 172 mL. Moderate
left hydronephrosis, similar to prior study. Mildly increased
echotexture.

Bladder:

Bladder is decompressed with Foley catheter in place.

Other:

None.
IMPRESSION: Bilateral hydronephrosis, stable on the left, slightly worsened on
the right since prior study.
